# Patient Record
Sex: Male | Born: 1945 | Race: White | Hispanic: No | Marital: Married | Smoking: Never smoker
Health system: Southern US, Community
[De-identification: ages and names within clinical notes are randomized; demographics above are authoritative.]

## PROBLEM LIST (undated history)

## (undated) DIAGNOSIS — J189 Pneumonia, unspecified organism: Secondary | ICD-10-CM

## (undated) DIAGNOSIS — I2510 Atherosclerotic heart disease of native coronary artery without angina pectoris: Secondary | ICD-10-CM

## (undated) DIAGNOSIS — I119 Hypertensive heart disease without heart failure: Secondary | ICD-10-CM

## (undated) DIAGNOSIS — M1990 Unspecified osteoarthritis, unspecified site: Secondary | ICD-10-CM

## (undated) DIAGNOSIS — F4310 Post-traumatic stress disorder, unspecified: Secondary | ICD-10-CM

## (undated) DIAGNOSIS — I219 Acute myocardial infarction, unspecified: Secondary | ICD-10-CM

## (undated) DIAGNOSIS — K219 Gastro-esophageal reflux disease without esophagitis: Secondary | ICD-10-CM

## (undated) DIAGNOSIS — E785 Hyperlipidemia, unspecified: Secondary | ICD-10-CM

## (undated) DIAGNOSIS — D649 Anemia, unspecified: Secondary | ICD-10-CM

## (undated) HISTORY — PX: SHOULDER SURGERY: SHX246

## (undated) HISTORY — PX: PROSTATE SURGERY: SHX751

## (undated) HISTORY — PX: TONSILLECTOMY: SUR1361

## (undated) HISTORY — PX: EYE SURGERY: SHX253

## (undated) HISTORY — DX: Hypertensive heart disease without heart failure: I11.9

## (undated) HISTORY — PX: KNEE SURGERY: SHX244

## (undated) HISTORY — DX: Hyperlipidemia, unspecified: E78.5

## (undated) HISTORY — PX: OTHER SURGICAL HISTORY: SHX169

---

## 1998-08-03 ENCOUNTER — Ambulatory Visit: Admission: RE | Admit: 1998-08-03 | Discharge: 1998-08-03 | Payer: Self-pay | Admitting: *Deleted

## 1999-04-29 ENCOUNTER — Ambulatory Visit: Admission: RE | Admit: 1999-04-29 | Discharge: 1999-04-29 | Payer: Self-pay | Admitting: Pulmonary Disease

## 1999-06-18 ENCOUNTER — Encounter: Payer: Self-pay | Admitting: *Deleted

## 1999-06-18 ENCOUNTER — Ambulatory Visit (HOSPITAL_COMMUNITY): Admission: RE | Admit: 1999-06-18 | Discharge: 1999-06-18 | Payer: Self-pay | Admitting: *Deleted

## 1999-08-08 ENCOUNTER — Ambulatory Visit (HOSPITAL_COMMUNITY): Admission: RE | Admit: 1999-08-08 | Discharge: 1999-08-09 | Payer: Self-pay | Admitting: *Deleted

## 1999-11-26 ENCOUNTER — Ambulatory Visit: Admission: RE | Admit: 1999-11-26 | Discharge: 1999-11-26 | Payer: Self-pay | Admitting: Pulmonary Disease

## 2000-07-05 ENCOUNTER — Emergency Department (HOSPITAL_COMMUNITY): Admission: EM | Admit: 2000-07-05 | Discharge: 2000-07-05 | Payer: Self-pay | Admitting: Emergency Medicine

## 2000-07-16 ENCOUNTER — Emergency Department (HOSPITAL_COMMUNITY): Admission: EM | Admit: 2000-07-16 | Discharge: 2000-07-16 | Payer: Self-pay | Admitting: Emergency Medicine

## 2001-09-29 HISTORY — PX: CORONARY ANGIOPLASTY WITH STENT PLACEMENT: SHX49

## 2001-10-22 ENCOUNTER — Ambulatory Visit (HOSPITAL_COMMUNITY): Admission: RE | Admit: 2001-10-22 | Discharge: 2001-10-23 | Payer: Self-pay | Admitting: Cardiology

## 2002-09-24 ENCOUNTER — Encounter: Payer: Self-pay | Admitting: Emergency Medicine

## 2002-09-24 ENCOUNTER — Emergency Department (HOSPITAL_COMMUNITY): Admission: EM | Admit: 2002-09-24 | Discharge: 2002-09-24 | Payer: Self-pay | Admitting: Emergency Medicine

## 2002-09-29 ENCOUNTER — Emergency Department (HOSPITAL_COMMUNITY): Admission: EM | Admit: 2002-09-29 | Discharge: 2002-09-29 | Payer: Self-pay | Admitting: Emergency Medicine

## 2003-04-18 ENCOUNTER — Ambulatory Visit (HOSPITAL_COMMUNITY): Admission: RE | Admit: 2003-04-18 | Discharge: 2003-04-18 | Payer: Self-pay | Admitting: Internal Medicine

## 2004-05-23 ENCOUNTER — Ambulatory Visit (HOSPITAL_COMMUNITY): Admission: RE | Admit: 2004-05-23 | Discharge: 2004-05-23 | Payer: Self-pay | Admitting: Family Medicine

## 2004-10-05 ENCOUNTER — Ambulatory Visit (HOSPITAL_COMMUNITY): Admission: RE | Admit: 2004-10-05 | Discharge: 2004-10-05 | Payer: Self-pay | Admitting: Family Medicine

## 2004-11-12 ENCOUNTER — Ambulatory Visit: Payer: Self-pay | Admitting: Orthopedic Surgery

## 2004-11-20 ENCOUNTER — Ambulatory Visit (HOSPITAL_COMMUNITY): Admission: RE | Admit: 2004-11-20 | Discharge: 2004-11-20 | Payer: Self-pay | Admitting: *Deleted

## 2004-11-20 ENCOUNTER — Ambulatory Visit: Payer: Self-pay | Admitting: Orthopedic Surgery

## 2004-11-21 ENCOUNTER — Ambulatory Visit: Payer: Self-pay | Admitting: Orthopedic Surgery

## 2004-11-29 ENCOUNTER — Encounter (HOSPITAL_COMMUNITY): Admission: RE | Admit: 2004-11-29 | Discharge: 2004-12-29 | Payer: Self-pay | Admitting: Orthopedic Surgery

## 2004-12-04 ENCOUNTER — Ambulatory Visit: Payer: Self-pay | Admitting: Orthopedic Surgery

## 2004-12-31 ENCOUNTER — Encounter (HOSPITAL_COMMUNITY): Admission: RE | Admit: 2004-12-31 | Discharge: 2005-01-30 | Payer: Self-pay | Admitting: Orthopedic Surgery

## 2005-01-02 ENCOUNTER — Ambulatory Visit: Payer: Self-pay | Admitting: Orthopedic Surgery

## 2005-01-31 ENCOUNTER — Ambulatory Visit: Payer: Self-pay | Admitting: Orthopedic Surgery

## 2005-06-17 ENCOUNTER — Ambulatory Visit: Payer: Self-pay | Admitting: Orthopedic Surgery

## 2005-08-29 ENCOUNTER — Ambulatory Visit: Payer: Self-pay | Admitting: Orthopedic Surgery

## 2005-09-04 ENCOUNTER — Ambulatory Visit: Payer: Self-pay | Admitting: Internal Medicine

## 2005-09-04 ENCOUNTER — Ambulatory Visit (HOSPITAL_COMMUNITY): Admission: RE | Admit: 2005-09-04 | Discharge: 2005-09-04 | Payer: Self-pay | Admitting: Internal Medicine

## 2005-11-07 ENCOUNTER — Ambulatory Visit: Payer: Self-pay | Admitting: Orthopedic Surgery

## 2006-04-02 ENCOUNTER — Ambulatory Visit: Payer: Self-pay | Admitting: Internal Medicine

## 2006-04-02 ENCOUNTER — Ambulatory Visit (HOSPITAL_COMMUNITY): Admission: RE | Admit: 2006-04-02 | Discharge: 2006-04-02 | Payer: Self-pay | Admitting: Internal Medicine

## 2006-04-02 ENCOUNTER — Encounter (INDEPENDENT_AMBULATORY_CARE_PROVIDER_SITE_OTHER): Payer: Self-pay | Admitting: *Deleted

## 2007-07-15 ENCOUNTER — Ambulatory Visit (HOSPITAL_COMMUNITY): Admission: RE | Admit: 2007-07-15 | Discharge: 2007-07-16 | Payer: Self-pay | Admitting: Neurosurgery

## 2007-09-29 ENCOUNTER — Ambulatory Visit (HOSPITAL_COMMUNITY): Admission: RE | Admit: 2007-09-29 | Discharge: 2007-09-29 | Payer: Self-pay | Admitting: General Surgery

## 2010-03-30 HISTORY — PX: TRANSTHORACIC ECHOCARDIOGRAM: SHX275

## 2010-05-14 ENCOUNTER — Ambulatory Visit (HOSPITAL_COMMUNITY): Admission: RE | Admit: 2010-05-14 | Discharge: 2010-05-14 | Payer: Self-pay | Admitting: Cardiology

## 2010-05-17 ENCOUNTER — Ambulatory Visit (HOSPITAL_COMMUNITY): Admission: RE | Admit: 2010-05-17 | Discharge: 2010-05-17 | Payer: Self-pay | Admitting: Cardiology

## 2010-05-17 HISTORY — PX: CARDIAC CATHETERIZATION: SHX172

## 2010-09-04 ENCOUNTER — Ambulatory Visit (HOSPITAL_COMMUNITY): Admission: RE | Admit: 2010-09-04 | Discharge: 2010-09-04 | Payer: Self-pay | Admitting: Family Medicine

## 2011-05-14 NOTE — Op Note (Signed)
Cory [name], Cory [name]              ACCOUNT NO.:  [id number]   MEDICAL RECORD NO.:  [id number]          PATIENT TYPE:  AMB   LOCATION:  SDS                          FACILITY:  [facility]   PHYSICIAN:  Cristi Loron, M.D.DATE OF BIRTH:  30-Jan-1946   DATE OF PROCEDURE:  07/15/2007  DATE OF DISCHARGE:                               OPERATIVE REPORT   BRIEF HISTORY:  The patient is a 65 year old white male who suffered  from back and right leg pain.  He failed medical management, was worked  up with lumbar MRI which demonstrated herniated disk at L4-5.  I  discussed the various treatment options with the patient including  surgery.  The patient weighed the risks, benefits and alternatives  decided proceed with a right L4 hemilaminectomy to decompress both the  right L4 and L5 nerve roots.   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   PROCEDURE:  Right L4 hemilaminectomy to decompress both the right L4-L5  nerve root and right L4-5 microdiskectomy using microdissection.   SURGEON:  Dr. Delma Officer.   ASSISTANT:  Dr. Shirlean Kelly   ANESTHESIA:  General endotracheal anesthesia.   ESTIMATED BLOOD LOSS:  50 mL.   SPECIMENS:  None.   DRAINS:  None.   COMPLICATIONS:  None.   DESCRIPTION OF PROCEDURE:  The patient brought to operating room by  anesthesia team, general endotracheal anesthesia was induced.  The  patient was turned to the prone position on the Wilson frame.  His  lumbosacral region was then shaved with clippers and prepared with  Betadine scrub and Betadine solution.  Sterile drapes were applied.  I  then injected area to be incised with Marcaine with epinephrine  solution.  Used scalpel to make a linear midline incision over the L4-5  interspace.  I used electrocautery perform a  right-sided subperiosteal  dissection exposing the right spinous process lamina of L3, 4 and 5.  I  then obtained intraoperative radiograph to confirm our location.   We then inserted the [facility] retractor for exposure and brought the  operative microscope into the field and under its magnification  illumination completed the microdissection/decompression.  Used high-  speed drill to perform a right L4 laminotomy.  I completed the right 4  hemilaminectomy with Kerrison punch and removed the right L3-4 and L4-5  ligamentum flavum.  I performed foraminotomies about the right L4 and L5  nerve root.  We then used microdissection to free up the thecal sac and  nerve roots from the epidural tissue and Dr. Newell Coral gently retracted  the neural structures medially with a D'Errico retractor.  We  encountered a free fragment disk herniation which had migrated cephalad  direction and was compressing the right L4 nerve root as it entered into  the neural foramen.  We used microdissection and the micro pituitary  forceps to free up and remove this disk fragment decompressing L4 nerve.  We then inspected the L4-5 intervertebral disk, it was bulging somewhat.  We used bipolar cautery to dissect the annulus.  We then inspected  caudal to the L4-5 intervertebral disk and I did not encounter any  further disk herniations.  We then palpated along the ventral surface of  the thecal sac and along the exit route of the right L4 and L5 nerve  roots and noted the nerve root well decompressed.  We then obtained  hemostasis using bipolar electrocautery.  We irrigated the wound out  bacitracin solution and then removed the [facility] retractor.  We then  reapproximated the patient's thoracolumbar fascia with interrupted #1  Vicryl sutures, subcutaneous tissue with 2-0 Vicryl suture and skin with  Steri-Strips and Benzoin.  The wound was then coated with bacitracin  ointment, sterile dressing applied.  The drapes  were removed.  The  patient was subsequently returned to supine position, extubated by  anesthesia team and transported to post anesthesia care unit in stable  condition.  All sponge and the instrument and needle counts correct end  this case.      Cristi Loron, M.D.  Electronically Signed     JDJ/MEDQ  D:  07/15/2007  T:  07/16/2007  Job:  [id number]

## 2011-05-17 NOTE — Cardiovascular Report (Signed)
[place]  Patient:    Cory [name], Cory [name] Visit Number: [id number] MRN: [id number]          Service Type: CAT Location: [id number] Attending Physician:  Pamella Pert Dictated by:   Delrae Rend, M.D. Proc. Date: 10/22/01 Admit Date:  10/22/2001 Discharge Date: 10/23/2001   CC:         Jonell Cluck, M.D.   Cardiac Catheterization  PROCEDURES PERFORMED: 1. Left ventriculography. 2. Selective right and left coronary arteriography. 3. Percutaneous transluminal coronary angioplasty and stenting of the    diagonal #1 with reduction of stenosis from 99% to 0% with a 2.5- x 15-mm    Express 2 stent.  INDICATIONS:  Mr. Purk is a 65 year old white male with no significant past medical history who underwent cardiovascular evaluation as a part of an insurance work-up.  On further questioning, he also admitted to having shortness of breath and chest discomfort which was suggestive of chronic stable angina pectoris.  He had a markedly abnormal exercise stress test; hence, he was brought to the cardiac catheterization lab to evaluate his coronary anatomy.  HEMODYNAMIC DATA:  The left ventricular pressure was 117/14 with an end diastolic pressure of 23 mmHg.  The central aortic pressure was 117/58 with a mean of 88 mmHg.  There was no pressure gradient across the aortic valve.  ANGIOGRAPHIC DATA:  Left ventriculography revealed mild distal anterolateral wall hypokinesis. The ejection fraction was preserved and was estimated around 55%.  There was no mitral regurgitation.  Coronary angiography: 1. Right coronary artery:  The right coronary artery was a dominant vessel.    It gives origin to PDA and PLV branches.  The PDA has proximal 40%    stenosis.  The right coronary artery has mild diffuse disease. 2. Left main coronary artery:  The left main coronary artery is a large    caliber vessel and trifurcates into a moderate sized  intermedius, a    moderate sized circumflex, and a large left anterior descending artery. 3. Circumflex artery:  The circumflex coronary artery is a moderate sized    vessel and gives origin to an obtuse marginal #1.  It is disease-free. 4. Intermedius coronary artery:  The intermedius coronary artery is a    moderate sized vessel and has a 50-60% proximal to ostial stenosis. 5. Left anterior descending artery:  The left anterior descending artery is a    large caliber vessel.  There is about 30% stenosis in the mid segment.  It    gives origin to a very large diagonal #1 and a small diagonal #2.  The    diagonal #1 is subtotally occluded in the mid segment (99% stenosis) with    TIMI-2 flow.  The diagonal #1 also has a secondary branch which appears to    be chronically occluded and is faintly regionalized in the late    cineangiography.  It is a small vessel.  IMPRESSION: 1. Normal left ventricular systolic function with distal anterolateral wall    hypokinesis. 2. High-grade stenosis of the diagonal #1. 3. Mildly elevated left ventricular end diastolic pressures.  RECOMMENDATIONS:  Will proceed with intervention of the diagonal #1.  INTERVENTIONAL DATA:  The diagonal #1 was predilated with a 2.5- x 20-mm CrossSail balloon.  The dilatation was performed at 14 atmospheric pressures for two minutes.  There was type B dissection noted at the end of the procedure.  PTCA and stenting with a 2.5- x 16-mm  Express 2 stent deployed at 12 atmospheric pressures for one minute.  The final stenosis was reduced from 99% to 0%.  The TIMI flow improved from TIMI-2 to TIMI-3.  There was stent jail and no flow was seen in the secondary diagonal branch which was a tiny vessel. Unable to wire this small secondary branch.  RECOMMENDATIONS: 1. Aggressive risk factor modification is recommended.  Will check his lipid    panel and be aggressive with his lipid management.  Will start on Zocor for    at  least a period of four to six weeks. 2. Continue with Toprol XL 50 q.h.s. and also start the patient on ACE    inhibitors.  TECHNIQUE OF PROCEDURE:  Under usual sterile precautions using 6-French right femoral arterial access, a 6-French MultiPurpose B2 catheter was advanced into the ascending aorta over a 0.035-mm J wire.  The catheter was gently advanced to the left ventricle and left ventricular pressures were monitored.  Hand contrast injection of the left ventricle was performed both in RAO and LAO projections.  The catheter was flushed with saline and pulled back into the ascending aorta.  Pressure gradient across the aortic valve was monitored. The right coronary artery was selectively engaged and arteriography was performed.  Then the catheter was disengaged from the right coronary artery and the left main coronary artery was selectively engaged and arteriography was performed.  The catheter was pulled out of the body.  INTERVENTIONAL TECHNIQUE:  The 6-French right femoral arterial access sheath was exchanged to an 8-French sheath.  Then an 8-French FL4 guide was advanced in the ascending aorta over a 0.035-mm J wire.  The left main coronary artery was selectively engaged and arteriography was performed.  Then a 180-cm x 0.014-mm luge wire was advanced into the guide and then eventually advanced to the left anterior descending artery, and the diagonal #1 was carefully wired and the lesion was easily crossed and the tip of the wire was carefully placed in the distal diagonal #1.  Then a 2.5- x 20-mm CrossSail balloon was advanced into the diagonal #1 and after confirming the position of the balloon, the balloon angioplasty at 14 atmospheric pressures for two minutes was performed. The balloon was deflated and pulled back into the guiding catheter and arteriography was performed.  Because of type B dissection, the balloon was withdrawn out of the body and exchanged to a 2.5- x 16-mm  Express 2 stent.  This stent was advanced into the lesion and after confirming the position in the diagonal lesion, the stent was deployed at 12 atmospheric pressures for one minute and the balloon was deflated and pulled back in the guiding catheter and arteriography was performed.  There was excellent flow noted with TIMI-3 flow.  This had improved from TIMI-2.  There was no evidence of dissection, no evidence of thrombus.  Then the balloon was withdrawn out of the body.  Then the same luge wire was used in an attempt to open the secondary branch in the diagonal #1; however, we were unable to cross into the occluded secondary branch of the diagonal.  Hence, the technique was abandoned and the guide catheter was disengaged and pulled out of the body.  The arterial access sheath was sutured in place and the patient was transferred to the floor in a stable condition.  Dictated by:   Delrae Rend, M.D.  Attending Physician:  Pamella Pert DD:  10/22/01 TD:  10/23/01 Job: [id number]

## 2011-05-17 NOTE — Op Note (Signed)
NAMEMARTICE, HELLUMS              ACCOUNT NO.:  [id number]   MEDICAL RECORD NO.:  [id number]          PATIENT TYPE:  AMB   LOCATION:  DAY                           FACILITY:  [facility]   PHYSICIAN:  Hildred Laser, M.D.    DATE OF BIRTH:  [birth year]   DATE OF PROCEDURE:  04/02/2006  DATE OF DISCHARGE:                                 OPERATIVE REPORT   PROCEDURE:  Esophagogastroduodenoscopy with esophageal dilation and biopsy.   INDICATION:  Cory [name] is a 65 year old Caucasian male with a several year  history of GERD who presented with solid food dysphagia back in September  2006.  He was noted to have ulcerative esophagitis with noncritical  stricture at the GE junction which was dilated to 19 mm.  He also had  abnormal patch of mucosa at the distal esophagus raising the question of  Barrett's.  This was not biopsied in the setting of inflammation.  The  stricture was dilated to 19 mm with a balloon.  He also had erosive antral  gastritis but his H. pylori serology was negative.  He has been maintained  on omeprazole 20 mg b.i.d. and antireflux measures.  He states his  swallowing has improved a great deal although he still has difficulty with  pills and at times with solids.  He denies heartburn, regurgitation.  The  procedure was reviewed with the patient, informed consent was obtained.   MEDICATIONS FOR CONSCIOUS SEDATION:  Benzocaine spray for pharyngeal topical  anesthesia, Demerol 50 mg IV, Versed 6 mg IV.   FINDINGS:  Procedure performed in endoscopy suite.  The patient's vital  signs and O2 sats were monitored during procedure and were normal.  The  patient was placed in the left lateral position and the Olympus videoscope  was passed via oropharynx without any difficulty into esophagus.   Esophagus.  Mucosa of the esophagus was normal.  There is 10 by 15 mm patch  of gastric type mucosa just above the GE junction.  Noncritical ring noted  at GE junction which was at 38 cm.   There was a small ulcer just distal to  this patch at the GE junction.  This area was biopsied on the way out and  specimen submitted in one container.  A few erosions were noted involving  herniated part of the stomach.  Hiatus was at 42 cm.   Stomach was empty and distended very well insufflation.  The folds of the  proximal stomach were normal.  Examination of the mucosa at body, antrum,  pyloric channel as well as angularis, fundus and cardia was normal.   Duodenum bulb mucosa was normal.  The scope was passed into postbulbar  duodenum where mucosa and folds were normal.  Endoscope was pulled back and  into the stomach.   Esophageal dilation was performed using a balloon dilator.  The balloon  dilator was advanced through the scope.  The guidewire was pushed out in the  gastric lumen.  The balloon dilator was positioned across the distal  esophagus and by withdrawing the scope in the body of the esophagus.  Balloon  was initially dilated to 18 then to 19 and then to 20 mm and the  distal esophagus was dilated.  However, there was no mucosal disruption  noted as was the case on previous dilation.  The balloon dilator was  withdrawn.  A biopsy was taken from this patch of mucosa in the GE junction  but there was small ulcer.  The endoscope was withdrawn.  The patient  tolerated the procedure well.   FINAL DIAGNOSIS:  Small patch of gastric type mucosa at distal esophagus  which was biopsied for histology to rule out Barrett's.  There is a small  ulcer at the GE junction.  The esophagitis has healed significantly since  previous exam of six months ago.  Noncritical ring at GE junction which was  dilated to 20 mm with a balloon without mucosal disruption.  Erosions noted  involving herniated part of the stomach without mucosal disruption.  A 4 cm  size sliding hiatal hernia with erosions involving herniated part of the  stomach.  Normal exam of the stomach, first and second part of the  duodenum.   RECOMMENDATIONS:  He will continue antireflux measures and omeprazole at 20  mg b.i.d.Marland Kitchen  I will be contacting the patient with biopsy results.  I would  like to see him in the office in six months.      Hildred Laser, M.D.  Electronically Signed     NR/MEDQ  D:  04/02/2006  T:  04/02/2006  Job:  [id number]   cc:   Bonne Dolores, M.D.  Fax: [phone]

## 2011-05-17 NOTE — Op Note (Signed)
NAMESHAWAN, CORELLA              ACCOUNT NO.:  [id number]   MEDICAL RECORD NO.:  [id number]          PATIENT TYPE:  AMB   LOCATION:  DAY                           FACILITY:  [facility]   PHYSICIAN:  Vickki Hearing, M.D.DATE OF BIRTH:  1946-02-25   DATE OF PROCEDURE:  11/20/2004  DATE OF DISCHARGE:                                 OPERATIVE REPORT   HISTORY:  This is a 65 year old male with left shoulder pain for several  months. Pain is related to the area over the [facility] joint. The patient had an  injection, took some oral medications, and continued to have pain.  Radiographs show a large acromioclavicular spur. Portion of the spur comes  from the acromion as well as the distal clavicle.   Pertinent positive physical findings include a positive cross-chest  adduction test and tenderness over the [facility] joint and painful forward  elevation of the shoulder.   PREOPERATIVE DIAGNOSIS:  Acromioclavicular joint arthritis, right shoulder.   POSTOPERATIVE DIAGNOSIS:  Acromioclavicular joint arthritis, right shoulder.   PROCEDURE:  Open Mumford procedure, left shoulder.   SURGEON:  Vickki Hearing, M.D.   ASSISTANT:  No assistants.   ANESTHESIA:  General.   OPERATIVE FINDINGS:  Hypertrophic acromioclavicular joint with degenerative  arthritis.   SPECIMENS:  Distal clavicle and medial acromion and anterior acromion.   DETAILS OF PROCEDURE:  The patient identified as Public relations account executive. He marked  his left shoulder as the operative site, and my initials were signed in the  same place. He was given Ancef and taken to the operating room for general  anesthesia. Once satisfactory anesthesia was obtained, he was prepped and  draped with a bump under the left shoulder. We took the time out as  required, and everyone in the operating room agreed on the procedure of open  Mumford, left shoulder, on [employer].   The skin was injected with Marcaine with epinephrine, and then an incision  was  made over the [facility] joint. This was taken down to the fascia which was  split transversely in line with the distal clavicle and carried across the  acromion. The [facility] joint was encountered and debrided. The distal 8 mm of  clavicle were removed. The posterior portion was tapered medially. The  portion of the acromion anteriorly and medially were also removed.  Bursectomy was performed. Rotator cuff was intact at the site of the spur.  Wound was irrigated. The bone edges were covered with bone wax. The fascia  was closed with #1 Vicryl, subcu tissue with 2-0 Vicryl, skin staples were  used to close the  skin. An additional 30 cc of Marcaine were injected, 10 in the remaining [facility]  joint area and 20 in the skin itself. Sterile dressings and a CryoCuff was  applied. The patient was extubated and taken to recovery room in stable  condition.     Weyman Croon   SEH/MEDQ  D:  11/20/2004  T:  11/20/2004  Job:  [id number]

## 2011-05-17 NOTE — Op Note (Signed)
   Cory [name], Cory [name]                        ACCOUNT NO.:  [id number]   MEDICAL RECORD NO.:  [id number]                   PATIENT TYPE:  AMB   LOCATION:  DAY                                  FACILITY:  [facility]   PHYSICIAN:  Lionel December, M.D.                 DATE OF BIRTH:  09/16/1946   DATE OF PROCEDURE:  04/18/2003  DATE OF DISCHARGE:                                 OPERATIVE REPORT   PROCEDURE:  Total colonoscopy.   INDICATIONS FOR PROCEDURE:  The patient is a 65 year old Caucasian male who  is undergoing screening colonoscopy.  He is at average risk for CRC.  The  procedure was reviewed with the patient, and informed consent was obtained.   PREOPERATIVE MEDICATIONS:  Demerol 20 mg IV, Versed 3 mg IV in divided  doses.   INSTRUMENT USED:  Olympus video system.   FINDINGS:  The procedure was performed in the endoscopy suite.  The  patient's vital signs and O2 saturations were monitored during the procedure  and remained stable.  The patient was placed in the left lateral position  and rectal examination performed.  This was within normal limits.  The scope  was placed into the rectum and advanced to the region of the sigmoid colon  and beyond.  Scattered diverticula were noted at the sigmoid and descending  colon.  The scope was passed to the cecum.  The preparation was excellent.  The cecum was identified by the ileocecal valve and appendiceal orifice.  Pictures were taken for the record.  As the scope was withdrawn, the colonic  mucosa was once again carefully examined.  There were no polyps and/or tumor  masses.  The rectal mucosa was normal.  The scope was retroflexed to examine  the anorectal junction which was unremarkable.  The endoscope was  straightened and withdrawn.  The patient tolerated the procedure well.   FINAL DIAGNOSIS:  Diverticula at sigmoid and descending colon; otherwise,  normal colonoscopy.   RECOMMENDATIONS:  1. High fiber diet.  2. Citrucel one  tablespoon full daily.                                               Lionel December, M.D.   NR/MEDQ  D:  04/18/2003  T:  04/18/2003  Job:  [id number]   cc:   Patrica Duel, M.D.  [address]  [place]  [place] [address]  Fax: [phone]

## 2011-05-17 NOTE — Discharge Summary (Signed)
Cory [name]. [facility]  Patient:    Cory [name], Cory [name] Visit Number: [id number] MRN: [id number]          Service Type: CAT Location: [id number] Attending Physician:  Pamella Pert Dictated by:   Marya Fossa, P.A. Admit Date:  10/22/2001 Discharge Date: 10/23/2001   CC:         Jonell Cluck, M.D.   Discharge Summary  DATE OF BIRTH:  07/24/1946  [facility] MEDICAL RECORD NUMBER:  [id number].  ADMISSION DIAGNOSES: 1. Abnormal treadmill exercise stress test. 2. Chest pain with exertion. 3. Obstructive sleep apnea.  DISCHARGE DIAGNOSES: 1. Coronary artery disease, status post cardiac catheterization with    intervention to a subtotal diagonal 1 lesion.  Residual noncritical    coronary artery disease.  Ejection fraction 55%. 2. Lipid profile pending at the time of discharge. 3. Chest pain with exertion. 4. Obstructive sleep apnea. 5. Abnormal treadmill exercise stress test.  HISTORY OF PRESENT ILLNESS:  Cory [name] is a pleasant 65 year old white male with a history of obstructive sleep apnea who underwent routine physical examination as part of life insurance policy.  At that time he was found to have some irregular skipped beats, and a stress test was ordered to evaluate for potential silent myocardial ischemia.  The patient states in the last year he has had chest discomfort in the form of heaviness with extremes of exertion.  He also has chronic shortness of breath which he relates to his obstructive sleep apnea.  He does use a CPAP machine at home.  He denies nocturnal dyspnea or orthopnea, and denies pain at work.  Exercise stress testing revealed chest tightness with peak exercise with 2 mm horizontal ST segment depression which persisted 8 minutes into recovery.  For this reason, the patient is scheduled for elective cardiac catheterization for definitive diagnosis and possible intervention.  PROCEDURES:   Cardiac catheterization on 10/22/01, by Dr. Jeanella Cara, with intervention to diagonal 1.  COMPLICATIONS:  None.  CONSULTATIONS:  None.  HOSPITAL COURSE:  Cory [name] was admitted to [facility] on 10/22/01, for elective cardiac catheterization.  Pre-procedure laboratory studies revealed a white blood cells of 5.4, hemoglobin of 14.9, and platelets 177. INR was 0.9.  Potassium 4.5, BUN 18, creatinine 1.0.  The patient underwent cardiac catheterization on 10/22/01, by Dr. Jeanella Cara. This revealed a normal left main, normal left anterior descending artery.  The diagonal 1 had a 99% subtotaled occlusion in its proximal portion.  There was a ramus intermedius branch with a 50 to 60% ostial lesion.  Circumflex was widely patent.  Right coronary artery had a distal 40% posterior descending coronary artery lesion.  Ejection fraction 55%.  Mild distal anterolateral hypokinesis on LV gram.  Dr. Jacinto Halim proceeded with intervention to the diagonal 1 lesion, reducing it from 99 to 0% with balloon and stent.  He reestablished TIMI III flow from TIMI II flow.  There was a small type B dissection which was stable at the conclusion of the procedure.  The patient tolerated the procedure well.  There were no problems with sheath pull.  On 10/23/01, the patient remained stable.  BUN 10, creatinine 0.9.  Groin stable.  It was felt the patient was stable for discharge to home.  He was seen by cardiac rehab, and reviewed risk factors and precautions for prevention of coronary artery disease.  DISCHARGE MEDICATIONS: 1. Toprol XL 50 mg q.d. 2. Zocor 20 mg q.d. 3. Accupril  5 mg b.i.d. 4. Aspirin 325 mg q.d. 5. Plavix 75 mg q.d. x 1 month. 6. Restoril 15 mg p.r.n. sleep. 7. Nitroglycerin p.r.n. chest pain.  The patient is to stop Imdur.  ACTIVITY:  No strenuous activity, lifting more than 5 pounds, or driving for two days.  He will stay out of work for one week.  He may return to work without  restriction on November 02, 2001.  DIET:  Low fat, low cholesterol, low salt diet.  WOUND CARE:  The patient may shower.  FOLLOWUP:  The office will call the patient with an appointment to see Dr. Jacinto Halim back in two weeks in the [place] office.  He needs to call and set up a follow-up appointment with Dr. Leonor Liv office. Dictated by:   Marya Fossa, P.A. Attending Physician:  Pamella Pert DD:  10/23/01 TD:  10/26/01 Job: [id number]

## 2011-05-17 NOTE — Op Note (Signed)
Cory [name], [name]              ACCOUNT NO.:  [id number]   MEDICAL RECORD NO.:  [id number]          PATIENT TYPE:  AMB   LOCATION:  DAY                           FACILITY:  [facility]   PHYSICIAN:  Lionel December, M.D.    DATE OF BIRTH:  1946/09/18   DATE OF PROCEDURE:  09/04/2005  DATE OF DISCHARGE:                                 OPERATIVE REPORT   PROCEDURE:  Esophagogastroduodenoscopy with esophageal dilation.   Cory [name] is 65 year old Caucasian male who presents with few months history  of dysphagia primarily to solids and his pills which is progressive. He has  history of heartburn. He states OTC Zantac takes Carafate. His last EGD was  several years ago in [place].   Procedure risks were reviewed the patient, informed consent was obtained.   PREMEDICATION:  Cetacaine spray for pharyngeal topical anesthesia, Demerol  50 milligrams IV, Versed 6 milligrams IV in divided dose.   FINDINGS:  Procedure performed in endoscopy suite. The patient's vital signs  and O2 sat were monitored during procedure and remained stable.   The patient was placed left lateral position. Olympus videoscope was passed  via oropharynx without any difficulty into esophagus.   Esophagus.  Mucosa of the proximal and middle third was normal. Distally  there were four ulcers. One ulcer was over a centimeter long about 4 mm  wide, about 2 cm proximal to GE junction.  The longest ulcer was about 2 cm  long and extended down to GE junction and two ulcers were at GE junction.  There was also a patch of Barrett's mucosa about 10 x 20 mm at distal  esophagus away from these ulcers around 10 o'clock.  Stricture was noted at  GE junction was at which was at 37 cm and was felt to be noncritical. Hiatus  was 41 cm.   Stomach. It was empty and distended very well with insufflation. Folds  proximal stomach were normal. Examination of mucosa revealed few antral  erosions but no ulcer was noted. Pyloric channel was  patent. Angularis,  fundus and cardia were examined by retroflexing the scope and were normal.   Duodenum.  Bulbar mucosa was normal. Scope was passed second part of  duodenum. The mucosa and folds were normal. Endoscope was withdrawn.  Endoscope was pulled back to the stomach. A balloon dilator was advanced  through the scope. The guidewire was pushed out and scope was withdrawn and  the dilator was positioned across the stricture and inflated to a diameter  of 18 mm. There was no mucosal disruption at GE junction. Therefore it was  inflated to a diameter of 19 mm and pressure was maintained for about a  minute. Disrupted and mucosal disruption and opening of the lumen. Pictures  taken for the record. Endoscope was withdrawn. The patient tolerated the  procedure well.   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  4 cm size sliding  hiatal hernia.  A short segment Barrett's esophagus which was not biopsied  given florid changes of esophagitis.  Erosive antral gastritis.   RECOMMENDATIONS:  Antireflux measures reinforced. He will stop Zantac, will  start him on omeprazole 20 milligrams p.o. b.i.d.  H pylori serology.  The  patient will return for follow-up EGD in 6 months from now when he will have  biopsy taken from this patch of Barrett's mucosa. The esophagus will be  redilated if indicated.      Lionel December, M.D.  Electronically Signed     NR/MEDQ  D:  09/04/2005  T:  09/04/2005  Job:  [id number]   cc:   Cory [name], M.D.  [address], [address]  [place]  [place] [address]  Fax: [phone]

## 2011-05-17 NOTE — H&P (Signed)
NAMESHONN, FARRUGGIA              ACCOUNT NO.:  [id number]   MEDICAL RECORD NO.:  [id number]          PATIENT TYPE:  AMB   LOCATION:  DAY                           FACILITY:  [facility]   PHYSICIAN:  Vickki Hearing, M.D.DATE OF BIRTH:  [birth year]   DATE OF ADMISSION:  DATE OF DISCHARGE:  LH                                HISTORY & PHYSICAL   CHIEF COMPLAINT:  Left shoulder pain.   HISTORY:  This is a 65 year old male who has left shoulder pain.   This patient has had several months of pain over his left shoulder, related  to the [facility] joint.  It is described as a dull ache and comes and goes.  It is  worsened by activity, relieved by rest.  There is no radiation.  The pain is  worse when the patient crosses his arm across the midline left to right.  We  injected the subacromial space.  X-rays showed some AC joint arthrosis.  That did not relieve his symptoms, and he now presents for surgery on his  left shoulder, mainly the [facility] joint, to have the [facility] joint excised via open  technique.   The patient's review of systems includes a 10-system review.  We listed all  10 systems as normal.   His past history reveals he is allergic to VIOXX and BENADRYL.  His medical  problems include sleep apnea, heart disease, hypertension, post-traumatic  stress disorder.  Previous surgeries include a right knee arthroscopy in  1985, a nasal polypectomy.  He had a tonsillectomy and adenoidectomy in  1997.  He had uvula and throat __________ in 1999.  He had a coronary stent  placed in 2002.   PHARMACY:  Two, for [employer], [place].   Multiple medications are taken.  The medications that he listed are  lisinopril, niacin, coated aspirin, multivitamin, trazodone, simvastatin,  Lexapro, and vitamin E.   He reported a negative family history.   SOCIAL HISTORY:  Family physician is Dr. Nobie Putnam.  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  His educational grade level is 12.   PHYSICAL EXAMINATION:  GENERAL:   A well-developed, well-nourished male with  normal grooming and hygiene.  He has a lean body habitus.  CARDIOVASCULAR:  No swelling or varicosities.  Pulses are intact.  EXTREMITIES:  Warm without edema or tenderness.  LYMPHATIC:  His cervical lymph nodes are benign.  MUSCULOSKELETAL:  His right shoulder shows full range of motion, strength,  and stability and is nontender.  His left AC joint is prominent, as is the  right, but the left is tender.  His cross-chest test is positive.  His  impingement sign is negative.  He does have some pain with internal  rotation, which was two levels below his opposite right shoulder.  Flexion  and external rotation were normal, as were the stability tests, and his cuff  tests were normal.  SKIN:  Without scar, rash, lesions, or ulceration.  NEUROPSYCHIATRIC:  Normal reflexes, sensation, mood, and orientation.  He  was awake and alert.  His coordination was normal.   His radiographs show AC  joint arthritis.   PLAN:  Open AC joint arthroplasty, so-called Mumford procedure, left  shoulder.     Weyman Croon   SEH/MEDQ  D:  11/19/2004  T:  11/19/2004  Job:  [id number]

## 2011-10-10 LAB — CLOTEST (H. PYLORI), BIOPSY: Helicobacter screen: NEGATIVE

## 2011-10-15 LAB — COMPREHENSIVE METABOLIC PANEL
ALT: 23
AST: 18
Albumin: 4.1
Alkaline Phosphatase: 64
BUN: 15
CO2: 28
Calcium: 9.7
Chloride: 103
Creatinine, Ser: 0.44
GFR calc Af Amer: >60
GFR calc non Af Amer: >60
Glucose, Bld: 92
Potassium: 4.2
Sodium: 138
Total Bilirubin: 0.5
Total Protein: 6.9

## 2011-10-15 LAB — CBC
HCT: 40.5
Hemoglobin: 13.9
MCHC: 34.3
MCV: 91.9
Platelets: 207
RBC: 4.41
RDW: 13.1
WBC: 6.2

## 2011-11-10 ENCOUNTER — Emergency Department (HOSPITAL_COMMUNITY): Payer: PRIVATE HEALTH INSURANCE

## 2011-11-10 ENCOUNTER — Emergency Department (HOSPITAL_COMMUNITY)
Admission: EM | Admit: 2011-11-10 | Discharge: 2011-11-10 | Disposition: A | Discharge status: Home / Self Care | Payer: PRIVATE HEALTH INSURANCE | Attending: Emergency Medicine | Admitting: Emergency Medicine

## 2011-11-10 DIAGNOSIS — Z7982 Long term (current) use of aspirin: Secondary | ICD-10-CM | POA: Insufficient documentation

## 2011-11-10 DIAGNOSIS — R209 Unspecified disturbances of skin sensation: Secondary | ICD-10-CM | POA: Insufficient documentation

## 2011-11-10 DIAGNOSIS — I10 Essential (primary) hypertension: Secondary | ICD-10-CM | POA: Insufficient documentation

## 2011-11-10 DIAGNOSIS — R079 Chest pain, unspecified: Secondary | ICD-10-CM | POA: Insufficient documentation

## 2011-11-10 DIAGNOSIS — Z9861 Coronary angioplasty status: Secondary | ICD-10-CM | POA: Insufficient documentation

## 2011-11-10 DIAGNOSIS — Z79899 Other long term (current) drug therapy: Secondary | ICD-10-CM | POA: Insufficient documentation

## 2011-11-10 DIAGNOSIS — J189 Pneumonia, unspecified organism: Secondary | ICD-10-CM

## 2011-11-10 LAB — COMPREHENSIVE METABOLIC PANEL
ALT: 15 U/L (ref 0–53)
AST: 14 U/L (ref 0–37)
Albumin: 3.2 g/dL — ABNORMAL LOW (ref 3.5–5.2)
Alkaline Phosphatase: 56 U/L (ref 39–117)
BUN: 17 mg/dL (ref 6–23)
CO2: 27 mEq/L (ref 19–32)
Calcium: 8.8 mg/dL (ref 8.4–10.5)
Chloride: 108 mEq/L (ref 96–112)
Creatinine, Ser: 0.88 mg/dL (ref 0.50–1.35)
GFR calc Af Amer: >90 mL/min (ref >90)
GFR calc non Af Amer: 89 mL/min — ABNORMAL LOW (ref >90)
Glucose, Bld: 89 mg/dL (ref 70–99)
Potassium: 4.2 mEq/L (ref 3.5–5.1)
Sodium: 142 mEq/L (ref 135–145)
Total Bilirubin: 0.2 mg/dL — ABNORMAL LOW (ref 0.3–1.2)
Total Protein: 6.1 g/dL (ref 6.0–8.3)

## 2011-11-10 LAB — CBC
HCT: 35.5 % — ABNORMAL LOW (ref 39.0–52.0)
Hemoglobin: 12.1 g/dL — ABNORMAL LOW (ref 13.0–17.0)
MCH: 31.4 pg (ref 26.0–34.0)
MCHC: 34.1 g/dL (ref 30.0–36.0)
MCV: 92.2 fL (ref 78.0–100.0)
Platelets: 128 10*3/uL — ABNORMAL LOW (ref 150–400)
RBC: 3.85 MIL/uL — ABNORMAL LOW (ref 4.22–5.81)
RDW: 12.7 % (ref 11.5–15.5)
WBC: 4.6 10*3/uL (ref 4.0–10.5)

## 2011-11-10 LAB — CARDIAC PANEL(CRET KIN+CKTOT+MB+TROPI)
CK, MB: 2.3 ng/mL (ref 0.3–4.0)
Relative Index: 2 (ref 0.0–2.5)
Total CK: 117 U/L (ref 7–232)
Troponin I: <0.3 ng/mL (ref <0.30)

## 2011-11-10 LAB — MAGNESIUM: Magnesium: 2.1 mg/dL (ref 1.5–2.5)

## 2011-11-10 LAB — PROTIME-INR
INR: 1.07 (ref 0.00–1.49)
Prothrombin Time: 14.1 seconds (ref 11.6–15.2)

## 2011-11-10 MED ORDER — IBUPROFEN 800 MG PO TABS: 800.0000 mg | ORAL_TABLET | Freq: Three times a day (TID) | ORAL | Status: AC

## 2011-11-10 MED ORDER — KETOROLAC TROMETHAMINE 30 MG/ML IJ SOLN
30.0000 mg | Freq: Once | INTRAMUSCULAR | Status: AC
  Administered 2011-11-10: 30 mg via INTRAVENOUS
  Filled 2011-11-10: qty 1

## 2011-11-10 MED ORDER — SODIUM CHLORIDE 0.9 % IV SOLN
999.0000 mL | Freq: Once | INTRAVENOUS | Status: AC
  Administered 2011-11-10: 999 mL via INTRAVENOUS

## 2011-11-10 MED ORDER — ASPIRIN 81 MG PO CHEW
324.0000 mg | CHEWABLE_TABLET | Freq: Once | ORAL | Status: AC
  Administered 2011-11-10: 324 mg via ORAL
  Filled 2011-11-10: qty 4

## 2011-11-10 MED ORDER — AZITHROMYCIN 250 MG PO TABS
500.0000 mg | ORAL_TABLET | Freq: Once | ORAL | Status: AC
  Administered 2011-11-10: 500 mg via ORAL
  Filled 2011-11-10: qty 2

## 2011-11-10 MED ORDER — AZITHROMYCIN 250 MG PO TABS: 500.0000 mg | ORAL_TABLET | Freq: Every day | ORAL | Status: AC

## 2011-11-10 MED ORDER — HYDROMORPHONE HCL PF 1 MG/ML IJ SOLN
INTRAMUSCULAR | Status: AC
  Administered 2011-11-10: 0.5 mg via INTRAVENOUS
  Filled 2011-11-10: qty 1

## 2011-11-10 MED ORDER — HYDROMORPHONE HCL PF 1 MG/ML IJ SOLN
0.5000 mg | Freq: Once | INTRAMUSCULAR | Status: AC
  Administered 2011-11-10: 0.5 mg via INTRAVENOUS

## 2011-11-10 NOTE — ED Provider Notes (Signed)
History     CSN: [id number] Arrival date & time: 11/10/2011  1:47 PM   First MD Initiated Contact with Patient 11/10/11 1402      Chief Complaint  Patient presents with  . Chest Pain    Hurting under the left ribs, took codiene and eased the pain, No SOB, N/V, off and on for 2 weeks, worse with exertion, feels like needle stickin gin the heart    (Consider location/radiation/quality/duration/timing/severity/associated sxs/prior treatment) HPI The patient presents with several weeks of chest pain. Symptoms initially began insidiously, and since that time have been spontaneous in onset and offset, lasting a few moments per episode. There is no clear provoking, nor alleviating factors. The pain is described as left-sided tingling, crampiness.  The symptoms have not been worsened by exertion, or deep breathing. Today the patient had an episode of discomfort that was different from multiple prior episodes, and it was more lateral, more inferior, and characteristically more sharp. This pain was neither pleuritic or exertional. It was neither provoked by anything remarkable, nor alleviated by anything, though on arrival the patient has minimal discomfort. Notably the patient has CAD with multiple prior stents. He is scheduled to see his cardiologist in 2 days. No fevers, chills, no cough, no disorientation, no dyspnea, no abdominal pain, no nausea, vomiting. Past Medical History  Diagnosis Date  . Hypertension   . Cholesterolosis     Past Surgical History  Procedure Date  . Coronary angioplasty with stent placement     History reviewed. No pertinent family history.  History  Substance Use Topics  . Smoking status: Never Smoker   . Smokeless tobacco: Not on file  . Alcohol Use: No      Review of Systems  All other systems reviewed and are negative.    Allergies  Vioxx  Home Medications   Current Outpatient Rx  Name Route Sig Dispense Refill  . ASPIRIN 81 MG PO CHEW Oral  Chew 162 mg by mouth daily.      Marland Kitchen DICLOFENAC SODIUM 75 MG PO TBEC Oral Take 75 mg by mouth 2 (two) times daily.      . OMEGA-3 FATTY ACIDS 1000 MG PO CAPS Oral Take 2 g by mouth daily.      Marland Kitchen GABAPENTIN (PHN) PO Oral Take 1 tablet by mouth 3 (three) times daily.      . ISOSORBIDE MONONITRATE ER 60 MG PO TB24 Oral Take 60 mg by mouth daily.      Marland Kitchen LISINOPRIL 5 MG PO TABS Oral Take 10 mg by mouth daily.      Creed Copper M PLUS PO TABS Oral Take 1 tablet by mouth daily.      Marland Kitchen NIACIN CR 500 MG PO TBCR Oral Take 500 mg by mouth 2 (two) times daily with a meal.      . OMEPRAZOLE 20 MG PO CPDR Oral Take 20 mg by mouth 2 (two) times daily.      Marland Kitchen PAROXETINE HCL 40 MG PO TABS Oral Take 40 mg by mouth every morning.      Marland Kitchen POLYSACCHARIDE IRON 150 MG PO CAPS Oral Take 150 mg by mouth daily.      Marland Kitchen PRESCRIPTION MEDICATION       . SIMVASTATIN 40 MG PO TABS Oral Take 40 mg by mouth at bedtime.      . TRAZODONE HCL 100 MG PO TABS Oral Take 100 mg by mouth at bedtime.        BP 119/71  Pulse 52  Temp(Src) 98.1 F (36.7 C) (Oral)  Resp 24  SpO2 98%  Physical Exam  Constitutional: He is oriented to person, place, and time. He appears well-developed and well-nourished.  HENT:  Head: Normocephalic and atraumatic.  Eyes: Conjunctivae are normal. Pupils are equal, round, and reactive to light.  Neck: Neck supple.  Cardiovascular: Normal rate and regular rhythm.   Pulmonary/Chest: No respiratory distress.       There is tenderness to palpation about the left inferior anterior ribs  Abdominal: Soft. There is no tenderness.  Musculoskeletal: He exhibits no edema.  Neurological: He is alert and oriented to person, place, and time.  Skin: Skin is warm and dry.  Psychiatric: He has a normal mood and affect.    ED Course  Procedures (including critical care time)   Labs Reviewed  CBC  CARDIAC PANEL(CRET KIN+CKTOT+MB+TROPI)  COMPREHENSIVE METABOLIC PANEL  MAGNESIUM  PROTIME-INR   No results  found.   No diagnosis found.   Date: 11/10/2011  Rate: 67  Rhythm: normal sinus rhythm  QRS Axis: normal  Intervals: normal  ST/T Wave abnormalities: normal  Conduction Disutrbances:none  Narrative Interpretation:   Old EKG Reviewed: unchanged '   MDM  This generally well appearing male now presents with weeks of chest pain. The chronicity of the pain, the absence of acute findings on EKG or labs his reassuring. However the patient's chest x-ray does demonstrate possible right sided pneumonia. Although this may explain the patient's pain, this would be an unusual presentation for a right-sided pneumonia. However, the patient will be provided antibiotics are to discharge. Other possibilities include pleurisy, or muscle strain. All of these different possibilities were discussed with the patient and his wife. They were provided the opportunity to have the patient stay preparation or go home with antibiotics and pain control, knowing that the patient has a followup appointment previously scheduled in 48 hours. The patient will be discharged with antibiotics and analgesics and instructions to followup as previously scheduled. Explicit return precautions were provided to the patient and his.        Carmin Muskrat, MD 11/10/11 1630

## 2011-11-10 NOTE — ED Notes (Signed)
Family at bedside. 

## 2011-11-10 NOTE — ED Notes (Signed)
Pt placed on cardiac monitor, cont. Pulse ox and 2lt o2 [place].

## 2011-11-29 ENCOUNTER — Encounter (HOSPITAL_COMMUNITY): Payer: Self-pay | Admitting: Emergency Medicine

## 2011-11-29 ENCOUNTER — Emergency Department (HOSPITAL_COMMUNITY)
Admission: EM | Admit: 2011-11-29 | Discharge: 2011-11-29 | Disposition: A | Discharge status: Home / Self Care | Payer: PRIVATE HEALTH INSURANCE | Attending: Emergency Medicine | Admitting: Emergency Medicine

## 2011-11-29 ENCOUNTER — Emergency Department (HOSPITAL_COMMUNITY): Payer: PRIVATE HEALTH INSURANCE

## 2011-11-29 DIAGNOSIS — I10 Essential (primary) hypertension: Secondary | ICD-10-CM | POA: Insufficient documentation

## 2011-11-29 DIAGNOSIS — S62639A Displaced fracture of distal phalanx of unspecified finger, initial encounter for closed fracture: Secondary | ICD-10-CM | POA: Insufficient documentation

## 2011-11-29 DIAGNOSIS — Z9861 Coronary angioplasty status: Secondary | ICD-10-CM | POA: Insufficient documentation

## 2011-11-29 DIAGNOSIS — K824 Cholesterolosis of gallbladder: Secondary | ICD-10-CM | POA: Insufficient documentation

## 2011-11-29 DIAGNOSIS — S62509A Fracture of unspecified phalanx of unspecified thumb, initial encounter for closed fracture: Secondary | ICD-10-CM

## 2011-11-29 DIAGNOSIS — W230XXA Caught, crushed, jammed, or pinched between moving objects, initial encounter: Secondary | ICD-10-CM | POA: Insufficient documentation

## 2011-11-29 DIAGNOSIS — IMO0002 Reserved for concepts with insufficient information to code with codable children: Secondary | ICD-10-CM

## 2011-11-29 DIAGNOSIS — Z7982 Long term (current) use of aspirin: Secondary | ICD-10-CM | POA: Insufficient documentation

## 2011-11-29 DIAGNOSIS — S61209A Unspecified open wound of unspecified finger without damage to nail, initial encounter: Secondary | ICD-10-CM | POA: Insufficient documentation

## 2011-11-29 DIAGNOSIS — Z23 Encounter for immunization: Secondary | ICD-10-CM | POA: Insufficient documentation

## 2011-11-29 MED ORDER — HYDROCODONE-ACETAMINOPHEN 5-325 MG PO TABS: ORAL_TABLET | ORAL | Status: AC

## 2011-11-29 MED ORDER — CEPHALEXIN 500 MG PO CAPS: 500.0000 mg | ORAL_CAPSULE | Freq: Four times a day (QID) | ORAL | Status: AC

## 2011-11-29 MED ORDER — TETANUS-DIPHTH-ACELL PERTUSSIS 5-2.5-18.5 LF-MCG/0.5 IM SUSP
0.5000 mL | Freq: Once | INTRAMUSCULAR | Status: AC
  Administered 2011-11-29: 0.5 mL via INTRAMUSCULAR
  Filled 2011-11-29: qty 0.5

## 2011-11-29 MED ORDER — CEPHALEXIN 500 MG PO CAPS
500.0000 mg | ORAL_CAPSULE | Freq: Once | ORAL | Status: AC
  Administered 2011-11-29: 500 mg via ORAL
  Filled 2011-11-29: qty 1

## 2011-11-29 MED ORDER — BACITRACIN-NEOMYCIN-POLYMYXIN 400-5-5000 EX OINT
TOPICAL_OINTMENT | Freq: Once | CUTANEOUS | Status: AC
  Administered 2011-11-29: 1 via TOPICAL
  Filled 2011-11-29: qty 1

## 2011-11-29 MED ORDER — HYDROCODONE-ACETAMINOPHEN 5-325 MG PO TABS: 1.0000 | ORAL_TABLET | Freq: Once | ORAL | Status: DC

## 2011-11-29 NOTE — ED Notes (Signed)
Dressing applied to patient's right thumb with neosporin, telfa and 3 inch kling. No bleeding noted at this time.

## 2011-11-29 NOTE — ED Notes (Signed)
Pt c/o right thumb injury after having lawnmower fall on it yesterday. Last tetanus unknown.

## 2011-11-29 NOTE — ED Notes (Signed)
Pt has laceration through his right thumb nail bed and thumb. Pt has small amount bleeding at this time. Bruising noted to nailbed. Pt states that he got his thumb caught between a cinder block and lawnmower when his riding lawnmower fell yesterday afternoon. Pt alert and oriented x 3. Skin warm and dry. Color pink.

## 2011-11-29 NOTE — ED Provider Notes (Signed)
History     CSN: [id number] Arrival date & time: 11/29/2011  9:24 AM   First MD Initiated Contact with Patient 11/29/11 [phone]      Chief Complaint  Patient presents with  . Laceration    (Consider location/radiation/quality/duration/timing/severity/associated sxs/prior treatment) HPI Comments: Patient c/o pain and crush injury to the right thumb after a lawn mower fell on his finger.  Also c/o disruption of the nail.  Denies numbness of the finger or other injuries.  Patient is a 65 y.o. male presenting with hand pain. The history is provided by the patient.  Hand Pain This is a new problem. The current episode started yesterday. The problem occurs constantly. The problem has been unchanged. Associated symptoms include arthralgias and joint swelling. Pertinent negatives include no chills, fever, myalgias, neck pain, numbness, rash, sore throat or weakness. Exacerbated by: movmeent and palpation. He has tried nothing for the symptoms. The treatment provided no relief.    Past Medical History  Diagnosis Date  . Hypertension   . Cholesterolosis     Past Surgical History  Procedure Date  . Coronary angioplasty with stent placement     History reviewed. No pertinent family history.  History  Substance Use Topics  . Smoking status: Never Smoker   . Smokeless tobacco: Not on file  . Alcohol Use: No      Review of Systems  Constitutional: Negative for fever and chills.  HENT: Negative for sore throat, trouble swallowing, neck pain and neck stiffness.   Musculoskeletal: Positive for joint swelling and arthralgias. Negative for myalgias and back pain.  Skin: Positive for wound. Negative for rash.  Neurological: Negative for dizziness, weakness, light-headedness and numbness.  Hematological: Does not bruise/bleed easily.  All other systems reviewed and are negative.    Allergies  Vioxx  Home Medications   Current Outpatient Rx  Name Route Sig Dispense Refill  .  ASPIRIN 81 MG PO CHEW Oral Chew 162 mg by mouth daily.     Marland Kitchen DICLOFENAC SODIUM 75 MG PO TBEC Oral Take 75 mg by mouth 2 (two) times daily.      . OMEGA-3 FATTY ACIDS 1000 MG PO CAPS Oral Take 2 g by mouth daily.      Marland Kitchen GABAPENTIN 100 MG PO CAPS Oral Take 100 mg by mouth 3 (three) times daily.      . ISOSORBIDE MONONITRATE ER 60 MG PO TB24 Oral Take 60 mg by mouth daily.      Marland Kitchen LISINOPRIL 5 MG PO TABS Oral Take 10 mg by mouth daily.      Carma Leaven M PLUS PO TABS Oral Take 1 tablet by mouth daily.      Marland Kitchen NIACIN CR 500 MG PO TBCR Oral Take 500 mg by mouth 2 (two) times daily with a meal.      . OMEPRAZOLE 20 MG PO CPDR Oral Take 20 mg by mouth 2 (two) times daily.      Marland Kitchen PAROXETINE HCL 40 MG PO TABS Oral Take 40 mg by mouth every morning.      Marland Kitchen POLYSACCHARIDE IRON 150 MG PO CAPS Oral Take 150 mg by mouth daily.      Marland Kitchen SIMVASTATIN 40 MG PO TABS Oral Take 40 mg by mouth at bedtime.      . TRAZODONE HCL 100 MG PO TABS Oral Take 100 mg by mouth at bedtime.        BP 138/70  Pulse 66  Temp 98.7 F (37.1 C)  Resp  20  Ht 5\' 10"  (1.778 m)  Wt 169 lb (76.658 kg)  BMI 24.25 kg/m2  SpO2 99%  Physical Exam  Nursing note and vitals reviewed. Constitutional: He is oriented to person, place, and time. He appears well-developed and well-nourished. No distress.  HENT:  Head: Normocephalic and atraumatic.  Cardiovascular: Normal rate, regular rhythm and normal heart sounds.   Pulmonary/Chest: Effort normal and breath sounds normal. No respiratory distress. He exhibits no tenderness.  Musculoskeletal: Normal range of motion. He exhibits tenderness. He exhibits no edema.       Hands:      Partial avulsion of the proximal nail of the right thumb.  Mild bleeding present.  Sensation intact.    Neurological: He is alert and oriented to person, place, and time. No cranial nerve deficit. He exhibits normal muscle tone. Coordination normal.  Skin: Skin is warm and dry.    ED Course  SPLINT  APPLICATION Date/Time: 11/29/2011 11:07 AM Performed by: Trisha Mangle, Blandon Offerdahl L. Authorized by: Maxwell Caul Consent: Verbal consent obtained. Written consent not obtained. Consent given by: patient Patient understanding: patient states understanding of the procedure being performed Patient consent: the patient's understanding of the procedure matches consent given Procedure consent: procedure consent matches procedure scheduled Imaging studies: imaging studies available Patient identity confirmed: verbally with patient Time out: Immediately prior to procedure a "time out" was called to verify the correct patient, procedure, equipment, support staff and site/side marked as required. Location details: right thumb Splint type: static finger Supplies used: aluminum splint Post-procedure: The splinted body part was neurovascularly unchanged following the procedure. Patient tolerance: Patient tolerated the procedure well with no immediate complications.   (including critical care time)  Labs Reviewed - No data to displayDg Finger Thumb Right  11/29/2011  *RADIOLOGY REPORT*  Clinical Data: Crush injury.  Pain.  RIGHT THUMB 2+V  Comparison: None.  Findings: There is a comminuted fracture of the distal phalanx in the right thumb.  This does not involve the DIP joint.  Extensive soft tissue swelling is present.  IMPRESSION:  1.  Comminuted subungual distal phalanx fracture in the right thumb. 2.  Diffuse soft tissue swelling without radiopaque foreign body.  Original Report Authenticated By: Jamesetta Orleans. MATTERN, M.D.        MDM     Right thumbnail is avulsed from the proximal end, slight bleeding present but controlled well with pressure.  Sensation in tact distally.  Entire thumb is warm and pink w/o significant edema.  Will leave the nail in place and wound cleaned and dressing applied.  He understands that he will lose his current nail.   Will splint the thumb and patient prefers to  follow-up with Dr. Romeo Apple.  I will give him referral and begin keflex.  Tetanus updated today.    Henritta Mutz L. [place], [place] 11/30/11 2246

## 2011-11-29 NOTE — Discharge Instructions (Signed)
Finger Fracture Fractures of fingers are breaks in the bones of the fingers. There are many types of fractures. There are different ways of treating these fractures, all of which can be correct. Your caregiver will discuss the best way to treat your fracture. TREATMENT  Finger fractures can be treated with:   Non-reduction - this means the bones are in place. The finger is splinted without changing the positions of the bone pieces. The splint is usually left on for about a week to ten days. This will depend on your fracture and what your caregiver thinks.   Closed reduction - the bones are put back into position without using surgery. The finger is then splinted.   ORIF (open reduction and internal fixation) - the fracture site is opened. Then the bone pieces are fixed into place with pins or some type of hardware. This is seldom required. It depends on the severity of the fracture.  Your caregiver will discuss the type of fracture you have and the treatment that will be best for that problem. If surgery is the treatment of choice, the following is information for you to know and also let your caregiver know about prior to surgery. LET YOUR CAREGIVER KNOW ABOUT:  Allergies   Medications taken including herbs, eye drops, over the counter medications, and creams   Use of steroids (by mouth or creams)   Previous problems with anesthetics or Novocaine   Possibility of pregnancy, if this applies   History of blood clots (thrombophlebitis)   History of bleeding or blood problems   Previous surgery   Other health problems  AFTER THE PROCEDURE After surgery, you will be taken to the recovery area where a nurse will check your progress. Once you're awake, stable, and taking fluids well, barring other problems you will be allowed to go home. Once home an ice pack applied to your operative site may help with discomfort and keep the swelling down. HOME CARE INSTRUCTIONS   Follow your  caregiver's instructions as to activities, exercises, physical therapy, and driving a car.   Use your finger and exercise as directed.   Only take over-the-counter or prescription medicines for pain, discomfort, or fever as directed by your caregiver. Do not take aspirin until your caregiver OK's it, as this can increase bleeding immediately following surgery.   Stop using ibuprofen if it upsets your stomach. Let your caregiver know about it.  SEEK MEDICAL CARE IF:  You have increased bleeding (more than a small spot) from the wound or from beneath your splint.   You develop redness, swelling, or increasing pain in the wound or from beneath your splint.   There is pus coming from the wound or from beneath your splint.   An unexplained oral temperature above 102 F (38.9 C) develops, or as your caregiver suggests.   There is a foul smell coming from the wound or dressing or from beneath your splint.  SEEK IMMEDIATE MEDICAL CARE IF:   You develop a rash.   You have difficulty breathing.   You have any allergic problems.  MAKE SURE YOU:   Understand these instructions.   Will watch your condition.   Will get help right away if you are not doing well or get worse.  Document Released: 03/30/2001 Document Revised: 08/28/2011 Document Reviewed: 08/04/2008 [facility] Patient Information 2012 [place].Thumb Fracture  There are many types of thumb fractures (breaks). There are different ways of treating these fractures, all of which may be correct, varying  from case to case. Your caregiver will discuss different ways to treat these fractures with you. TREATMENT   Immobilization. This means the fracture is casted as it is without changing the positions of the fracture (bone pieces) involved. This fracture is casted in a "thumb spica" also called a hitchhiker cast. It is generally left on for 2 to 6 weeks.   Closed reduction. The bones are manipulated back into position without using  surgery.   ORIF (open reduction and internal fixation). The fracture site is opened and the bone pieces are fixed into place with some type of hardware such as screws or wires.  Your caregiver will discuss the type of fracture you have and the treatment that will be best for that problem. If surgery is the treatment of choice, the following is information for you to know and to let your caregiver know about prior to surgery. LET YOUR CAREGIVERS KNOW ABOUT:  Allergies.   Medications taken including herbs, eye drops, over the counter medications, and creams.   Use of steroids (by mouth or creams).   Previous problems with anesthetics or Novocain.   Family history of anesthetic complications..   Possibility of pregnancy, if this applies.   History of blood clots (thrombophlebitis).   History of bleeding or blood problems.   Previous surgery.   Other health problems.  AFTER THE PROCEDURE  After surgery, you will be taken to the recovery area. A nurse will watch and check your progress. Once you are awake, stable, and taking fluids well, barring other problems you will be allowed to go home. Once home, an ice pack applied to your operative site may help with discomfort and keep the swelling down. Elevate your hand above your heart as much as possible for the first 4-5 days after the injury/surgery. HOME CARE INSTRUCTIONS   Follow your caregiver's instructions as to activities, exercises, physical therapy, and driving a car.   Use thumb and exercise as directed.   Only take over-the-counter or prescription medicines for pain, discomfort, or fever as directed by your caregiver. Do not take aspirin until your caregiver instructs. This can increase bleeding immediately following surgery.  SEEK MEDICAL CARE IF:   There is increased bleeding (more than a small spot) from the wound or from beneath your cast or splint.   There is redness, swelling, or increasing pain in the wound or from  beneath your cast or splint.   You have pus coming from wound or from beneath your cast or splint.   An unexplained oral temperature above 102 F (38.9 C) develops.   There is a foul smell coming from the wound or dressing or from beneath your cast or splint.  SEEK IMMEDIATE MEDICAL CARE IF:   You develop severe pain, decreased sensation such as numbness or tingling.   You develop a rash.   You have difficulty breathing.   Youhave any allergic problems.  If you do not have a window in your cast for observing the wound, a discharge or minor bleeding may show up as a stain on the outside of your cast. Report these findings to your caregiver. If you have a removable splint overlying the surgical dressings it is common to see a small amount of bleeding. Change the dressings as instructed by your caregiver. Document Released: 09/14/2003 Document Revised: 08/28/2011 Document Reviewed: 04/25/2008 [facility] Patient Information 2012 [place].

## 2011-12-01 NOTE — ED Provider Notes (Signed)
Medical screening examination/treatment/procedure(s) were performed by non-physician practitioner and as supervising physician I was immediately available for consultation/collaboration.    Shelda Jakes, MD 12/01/11 1236

## 2011-12-03 ENCOUNTER — Ambulatory Visit (INDEPENDENT_AMBULATORY_CARE_PROVIDER_SITE_OTHER): Payer: PRIVATE HEALTH INSURANCE | Admitting: Orthopedic Surgery

## 2011-12-03 ENCOUNTER — Encounter: Payer: Self-pay | Admitting: Orthopedic Surgery

## 2011-12-03 VITALS — BP 132/80 | Ht 70.0 in | Wt 169.0 lb

## 2011-12-03 DIAGNOSIS — S62609A Fracture of unspecified phalanx of unspecified finger, initial encounter for closed fracture: Secondary | ICD-10-CM

## 2011-12-03 DIAGNOSIS — S62509A Fracture of unspecified phalanx of unspecified thumb, initial encounter for closed fracture: Secondary | ICD-10-CM

## 2011-12-03 NOTE — Patient Instructions (Signed)
Finish the antibiotic   Change band aid daily

## 2011-12-03 NOTE — Progress Notes (Signed)
Chief complaint pain, RIGHT thumb.  Date of injury was Thursday lawnmower fell on her RIGHT thumb. Initial evaluation emergency room. He does have a nondisplaced comminuted fracture of the distal phalanx of the thumb. He is complaining of 5/10. Constant, sharp pain with swelling, but is not requiring medication at this time. He does take Keflex, which is not have any trouble with. His review of systems is negative, except for some nervousness, and easy bleeding.  Exam shows a healthy, well-developed, well-nourished, male coming in hygiene are intact. The RIGHT thumb is lacerated over the top of the nail. There is bleeding under the nail. The flexion of the joint is normal. Flexion power normal extension power normal. No sensory deficits. Good color and capillary refill.  Impression closed fracture, distal phalanx, RIGHT thumb.  Recommend dressing changes, and two-week followup. Finish all antibiotics

## 2011-12-17 ENCOUNTER — Ambulatory Visit (INDEPENDENT_AMBULATORY_CARE_PROVIDER_SITE_OTHER): Payer: PRIVATE HEALTH INSURANCE | Admitting: Orthopedic Surgery

## 2011-12-17 ENCOUNTER — Encounter: Payer: Self-pay | Admitting: Orthopedic Surgery

## 2011-12-17 VITALS — BP 104/70 | Ht 70.0 in | Wt 169.0 lb

## 2011-12-17 DIAGNOSIS — S62609A Fracture of unspecified phalanx of unspecified finger, initial encounter for closed fracture: Secondary | ICD-10-CM

## 2011-12-17 DIAGNOSIS — S62509A Fracture of unspecified phalanx of unspecified thumb, initial encounter for closed fracture: Secondary | ICD-10-CM

## 2011-12-17 NOTE — Progress Notes (Signed)
Patient ID: Cory [name], male   DOB: [birth year], 64 y.o.   MRN: [id number] Followup visit. LEFT thumb tip injury. The nail was come off the base of the nail is normal. The patient has normal flexion at the interphalangeal joint and has been given instructions to resume normal activity

## 2011-12-17 NOTE — Patient Instructions (Signed)
No specific instructions 

## 2012-01-07 ENCOUNTER — Encounter (HOSPITAL_COMMUNITY): Payer: Self-pay | Admitting: *Deleted

## 2012-01-07 ENCOUNTER — Encounter (HOSPITAL_COMMUNITY): Payer: Self-pay | Admitting: Emergency Medicine

## 2012-01-07 ENCOUNTER — Inpatient Hospital Stay (HOSPITAL_COMMUNITY)
Admission: EM | Admit: 2012-01-07 | Discharge: 2012-01-11 | DRG: 514 | Disposition: A | Discharge status: Home / Self Care | Payer: Non-veteran care | Source: Ambulatory Visit | Attending: Orthopedic Surgery | Admitting: Orthopedic Surgery

## 2012-01-07 ENCOUNTER — Emergency Department (HOSPITAL_COMMUNITY)
Admission: EM | Admit: 2012-01-07 | Discharge: 2012-01-07 | Disposition: A | Discharge status: Home / Self Care | Payer: PRIVATE HEALTH INSURANCE | Attending: Emergency Medicine | Admitting: Emergency Medicine

## 2012-01-07 DIAGNOSIS — L02519 Cutaneous abscess of unspecified hand: Secondary | ICD-10-CM | POA: Insufficient documentation

## 2012-01-07 DIAGNOSIS — I2510 Atherosclerotic heart disease of native coronary artery without angina pectoris: Secondary | ICD-10-CM | POA: Diagnosis present

## 2012-01-07 DIAGNOSIS — I10 Essential (primary) hypertension: Secondary | ICD-10-CM | POA: Diagnosis present

## 2012-01-07 DIAGNOSIS — M79609 Pain in unspecified limb: Secondary | ICD-10-CM | POA: Insufficient documentation

## 2012-01-07 DIAGNOSIS — M7989 Other specified soft tissue disorders: Secondary | ICD-10-CM | POA: Insufficient documentation

## 2012-01-07 DIAGNOSIS — Z79899 Other long term (current) drug therapy: Secondary | ICD-10-CM | POA: Insufficient documentation

## 2012-01-07 DIAGNOSIS — M2550 Pain in unspecified joint: Secondary | ICD-10-CM | POA: Insufficient documentation

## 2012-01-07 DIAGNOSIS — L03019 Cellulitis of unspecified finger: Secondary | ICD-10-CM | POA: Diagnosis present

## 2012-01-07 DIAGNOSIS — R609 Edema, unspecified: Secondary | ICD-10-CM | POA: Insufficient documentation

## 2012-01-07 DIAGNOSIS — F4310 Post-traumatic stress disorder, unspecified: Secondary | ICD-10-CM | POA: Diagnosis present

## 2012-01-07 DIAGNOSIS — IMO0002 Reserved for concepts with insufficient information to code with codable children: Secondary | ICD-10-CM

## 2012-01-07 DIAGNOSIS — M2540 Effusion, unspecified joint: Secondary | ICD-10-CM | POA: Insufficient documentation

## 2012-01-07 DIAGNOSIS — M65839 Other synovitis and tenosynovitis, unspecified forearm: Principal | ICD-10-CM | POA: Diagnosis present

## 2012-01-07 DIAGNOSIS — Z7982 Long term (current) use of aspirin: Secondary | ICD-10-CM | POA: Insufficient documentation

## 2012-01-07 DIAGNOSIS — L02511 Cutaneous abscess of right hand: Secondary | ICD-10-CM

## 2012-01-07 HISTORY — DX: Post-traumatic stress disorder, unspecified: F43.10

## 2012-01-07 HISTORY — DX: Pneumonia, unspecified organism: J18.9

## 2012-01-07 HISTORY — DX: Atherosclerotic heart disease of native coronary artery without angina pectoris: I25.10

## 2012-01-07 LAB — DIFFERENTIAL
Basophils Absolute: 0 10*3/uL (ref 0.0–0.1)
Basophils Relative: 0 % (ref 0–1)
Eosinophils Absolute: 0.1 10*3/uL (ref 0.0–0.7)
Eosinophils Relative: 1 % (ref 0–5)
Lymphocytes Relative: 11 % — ABNORMAL LOW (ref 12–46)
Lymphs Abs: 1.6 10*3/uL (ref 0.7–4.0)
Monocytes Absolute: 1.4 10*3/uL — ABNORMAL HIGH (ref 0.1–1.0)
Monocytes Relative: 10 % (ref 3–12)
Neutro Abs: 10.7 10*3/uL — ABNORMAL HIGH (ref 1.7–7.7)
Neutrophils Relative %: 78 % — ABNORMAL HIGH (ref 43–77)

## 2012-01-07 LAB — CBC
HCT: 36.2 % — ABNORMAL LOW (ref 39.0–52.0)
Hemoglobin: 12.5 g/dL — ABNORMAL LOW (ref 13.0–17.0)
MCH: 30.9 pg (ref 26.0–34.0)
MCHC: 34.5 g/dL (ref 30.0–36.0)
MCV: 89.4 fL (ref 78.0–100.0)
Platelets: 177 10*3/uL (ref 150–400)
RBC: 4.05 MIL/uL — ABNORMAL LOW (ref 4.22–5.81)
RDW: 12.2 % (ref 11.5–15.5)
WBC: 13.8 10*3/uL — ABNORMAL HIGH (ref 4.0–10.5)

## 2012-01-07 MED ORDER — VANCOMYCIN HCL IN DEXTROSE 1-5 GM/200ML-% IV SOLN
1000.0000 mg | Freq: Once | INTRAVENOUS | Status: AC
  Administered 2012-01-07: 1000 mg via INTRAVENOUS
  Filled 2012-01-07: qty 200

## 2012-01-07 MED ORDER — HYDROMORPHONE HCL PF 1 MG/ML IJ SOLN
1.0000 mg | Freq: Once | INTRAMUSCULAR | Status: AC
  Administered 2012-01-07: 1 mg via INTRAVENOUS
  Filled 2012-01-07: qty 1

## 2012-01-07 MED ORDER — HYDROMORPHONE HCL PF 1 MG/ML IJ SOLN
0.5000 mg | Freq: Once | INTRAMUSCULAR | Status: AC
  Administered 2012-01-07: 0.5 mg via INTRAVENOUS
  Filled 2012-01-07: qty 1

## 2012-01-07 NOTE — ED Notes (Signed)
Seen at [facility] yesterday for thumb infection Feels it is getting worse and painful

## 2012-01-07 NOTE — ED Notes (Signed)
Pt sent from [facility] hosp to see Dr Amanda Pea r/t  abscess

## 2012-01-07 NOTE — ED Notes (Signed)
Per Cory [name] Pt is to be d/c'd with IV intact. Pt is to go directly to [facility] ED via private vehicle.

## 2012-01-07 NOTE — ED Provider Notes (Signed)
Patient arrived from [facility] to see Dr. Amanda Pea for evaluation and treatment of right thumb abscess and cellulitis.  Patient had received one gram of vancomycin prior to departure from [facility].  Patient conscious, responsive, resting comfortably with family at bedside.  Dorsal surface of right thumb with erythema and abscess proximal phalanx.  S1/S2, RRR, no murmur.  Diffuse wheezing noted to auscultation.  Abdomen soft, bowel sounds present.  Distal PMS intact all extremities.  Dr. Amanda Pea here to see patient.  Jimmye Norman, NP 01/07/12 2248

## 2012-01-07 NOTE — ED Notes (Signed)
Pt presents from [facility] to be seen by dr Amanda Pea for eval of abcess/cellulitis to rt thumb. Pt states sx began on Sunday, was given abx by [place] md on Monday. States erythema began spreading into hand today. Cms/rom remains intact but mvt painful. Pt in no acute distress, a&ox4.

## 2012-01-07 NOTE — ED Provider Notes (Signed)
Medical screening examination/treatment/procedure(s) were performed by non-physician practitioner and as supervising physician I was immediately available for consultation/collaboration.   Tal Kempker A. Patrica Duel, MD 01/07/12 2352

## 2012-01-07 NOTE — ED Provider Notes (Signed)
Medical screening examination/treatment/procedure(s) were conducted as a shared visit with non-physician practitioner(s) and myself.  I personally evaluated the patient during the encounter Pt with swelling and tendernous to right thumb.  Dx cellulitis right hand  Benny Lennert, MD 01/07/12 2115

## 2012-01-07 NOTE — ED Provider Notes (Signed)
History     CSN: [id number]  Arrival date & time 01/07/12  1620   First MD Initiated Contact with Patient 01/07/12 1727      Chief Complaint  Patient presents with  . Cellulitis    (Consider location/radiation/quality/duration/timing/severity/associated sxs/prior treatment) HPI Comments: Patient c/o increasing pain, swelling to his right proximal thumb that began 3 days ago. He states it began as a "bump".  He was seen by his PMD at the [facility] yesterday and started on Cipro.  He began having increased pain and redness to his dorsal hand today at 2:00 pm.  Redness now extends to his wrist.  He states pain is worse with movement of the thumb  Patient is a 66 y.o. male presenting with hand pain. The history is provided by the patient.  Hand Pain This is a new problem. The current episode started in the past 7 days. The problem occurs constantly. The problem has been gradually worsening. Associated symptoms include arthralgias and joint swelling. Pertinent negatives include no chills, fever, nausea, numbness, swollen glands, vomiting or weakness. Exacerbated by: movement, use and palpation. Treatments tried: oral antibiotics. The treatment provided no relief.    Past Medical History  Diagnosis Date  . Hypertension   . Cholesterolosis   . Coronary artery disease   . Pneumonia   . PTSD (post-traumatic stress disorder)     Past Surgical History  Procedure Date  . Coronary angioplasty with stent placement   . Shoulder surgery   . Knee surgery   . Tonsillectomy     History reviewed. No pertinent family history.  History  Substance Use Topics  . Smoking status: Never Smoker   . Smokeless tobacco: Not on file  . Alcohol Use: No      Review of Systems  Constitutional: Negative for fever and chills.  Gastrointestinal: Negative for nausea and vomiting.  Musculoskeletal: Positive for joint swelling and arthralgias.  Skin: Positive for color change.  Neurological: Negative for  weakness and numbness.  Hematological: Negative for adenopathy. Does not bruise/bleed easily.  All other systems reviewed and are negative.    Allergies  Benadryl; Benadryl; and Vioxx  Home Medications   Current Outpatient Rx  Name Route Sig Dispense Refill  . ASPIRIN EC 81 MG PO TBEC Oral Take 162 mg by mouth at bedtime.      Marland Kitchen CIPROFLOXACIN HCL 500 MG PO TABS Oral Take 500 mg by mouth 2 (two) times daily. For 10 days     . DICLOFENAC SODIUM 75 MG PO TBEC Oral Take 75 mg by mouth 2 (two) times daily.      . OMEGA-3 FATTY ACIDS 1000 MG PO CAPS Oral Take 2 g by mouth daily.      Marland Kitchen GABAPENTIN 100 MG PO CAPS Oral Take 100 mg by mouth 3 (three) times daily.      Marland Kitchen HYDROCODONE-ACETAMINOPHEN 5-325 MG PO TABS Oral Take 1 tablet by mouth Every 4 hours as needed. For pain    . ISOSORBIDE MONONITRATE ER 60 MG PO TB24 Oral Take 60 mg by mouth daily.      Marland Kitchen LISINOPRIL 5 MG PO TABS Oral Take 10 mg by mouth daily.      Carma Leaven M PLUS PO TABS Oral Take 1 tablet by mouth daily.      Marland Kitchen NIACIN ER 500 MG PO TBCR Oral Take 1,000 mg by mouth at bedtime.     . OMEPRAZOLE 20 MG PO CPDR Oral Take 20 mg by mouth 2 (  two) times daily.      Marland Kitchen PAROXETINE HCL 40 MG PO TABS Oral Take 40 mg by mouth every morning.      Marland Kitchen POLYSACCHARIDE IRON 150 MG PO CAPS Oral Take 150 mg by mouth daily.      Marland Kitchen PREDNISONE 10 MG PO TABS Oral Take 10 mg by mouth daily. Take four tablets daily for 4 days, then take two tablets daily for 2 days, then take one tablet daily for 2 days, then take one-half tablet daily for 2 days, then stop     . SIMVASTATIN 40 MG PO TABS Oral Take 40 mg by mouth at bedtime.      . TRAZODONE HCL 100 MG PO TABS Oral Take 100 mg by mouth at bedtime.        BP 123/84  Pulse 76  Temp(Src) 97.4 F (36.3 C) (Oral)  Resp 18  Ht 5\' 9"  (1.753 m)  Wt 170 lb (77.111 kg)  BMI 25.10 kg/m2  SpO2 97%  Physical Exam  Nursing note and vitals reviewed. Constitutional: He is oriented to person, place, and time. He  appears well-developed and well-nourished. No distress.  HENT:  Head: Normocephalic.  Eyes: EOM are normal. Pupils are equal, round, and reactive to light.  Cardiovascular: Normal rate, regular rhythm and normal heart sounds.   Pulmonary/Chest: Effort normal and breath sounds normal.  Musculoskeletal: He exhibits edema and tenderness.       Right hand: He exhibits decreased range of motion, tenderness and swelling. He exhibits normal capillary refill. normal sensation noted. Normal strength noted.       Hands: Neurological: He is oriented to person, place, and time.  Skin: Skin is warm. There is erythema.       See MS exam    ED Course  Procedures (including critical care time)  Labs Reviewed  CBC - Abnormal; Notable for the following:    WBC 13.8 (*)    RBC 4.05 (*)    Hemoglobin 12.5 (*)    HCT 36.2 (*)    All other components within normal limits  DIFFERENTIAL - Abnormal; Notable for the following:    Neutrophils Relative 78 (*)    Neutro Abs 10.7 (*)    Lymphocytes Relative 11 (*)    Monocytes Absolute 1.4 (*)    All other components within normal limits        MDM    I have consulted Dr. Amanda Pea.  He agrees to see the patient in the ED at [facility] for probable I&D of abscess to the right thumb.    I have consulted Dr. Patrica Duel and Felicie Morn, NP at [facility] to notify them of the patient's arrival to the ED and that Dr. Amanda Pea to see him in the ED.         Cory [name], [place] 01/07/12 2201

## 2012-01-07 NOTE — ED Notes (Signed)
Pt a/ox4. Resp even and unlabored. NAD at this time. D/C instructions reviewed with pt. Pt verbalized understanding. Pt ambulated to lobby with steady gate.  

## 2012-01-08 LAB — DIFFERENTIAL
Basophils Absolute: 0 10*3/uL (ref 0.0–0.1)
Basophils Relative: 0 % (ref 0–1)
Eosinophils Absolute: 0 10*3/uL (ref 0.0–0.7)
Eosinophils Relative: 0 % (ref 0–5)
Lymphocytes Relative: 6 % — ABNORMAL LOW (ref 12–46)
Lymphs Abs: 1 10*3/uL (ref 0.7–4.0)
Monocytes Absolute: 1.1 10*3/uL — ABNORMAL HIGH (ref 0.1–1.0)
Monocytes Relative: 7 % (ref 3–12)
Neutro Abs: 14.1 10*3/uL — ABNORMAL HIGH (ref 1.7–7.7)
Neutrophils Relative %: 87 % — ABNORMAL HIGH (ref 43–77)

## 2012-01-08 LAB — CBC
HCT: 37.7 % — ABNORMAL LOW (ref 39.0–52.0)
Hemoglobin: 13.1 g/dL (ref 13.0–17.0)
MCH: 31.4 pg (ref 26.0–34.0)
MCHC: 34.7 g/dL (ref 30.0–36.0)
MCV: 90.4 fL (ref 78.0–100.0)
Platelets: 192 10*3/uL (ref 150–400)
RBC: 4.17 MIL/uL — ABNORMAL LOW (ref 4.22–5.81)
RDW: 12.6 % (ref 11.5–15.5)
WBC: 16.2 10*3/uL — ABNORMAL HIGH (ref 4.0–10.5)

## 2012-01-08 LAB — WOUND CULTURE

## 2012-01-08 LAB — BASIC METABOLIC PANEL
BUN: 17 mg/dL (ref 6–23)
CO2: 24 mEq/L (ref 19–32)
Calcium: 9.4 mg/dL (ref 8.4–10.5)
Chloride: 102 mEq/L (ref 96–112)
Creatinine, Ser: 0.75 mg/dL (ref 0.50–1.35)
GFR calc Af Amer: >90 mL/min (ref >90)
GFR calc non Af Amer: >90 mL/min (ref >90)
Glucose, Bld: 151 mg/dL — ABNORMAL HIGH (ref 70–99)
Potassium: 4.1 mEq/L (ref 3.5–5.1)
Sodium: 137 mEq/L (ref 135–145)

## 2012-01-08 MED ORDER — TRAZODONE HCL 100 MG PO TABS
100.0000 mg | ORAL_TABLET | Freq: Every evening | ORAL | Status: DC | PRN
  Filled 2012-01-08: qty 1

## 2012-01-08 MED ORDER — PREDNISONE 5 MG PO TABS: 5.0000 mg | ORAL_TABLET | Freq: Once | ORAL | Status: DC

## 2012-01-08 MED ORDER — PREDNISONE 10 MG PO TABS
15.0000 mg | ORAL_TABLET | Freq: Once | ORAL | Status: AC
  Administered 2012-01-11: 15 mg via ORAL
  Filled 2012-01-08: qty 1

## 2012-01-08 MED ORDER — MORPHINE SULFATE 2 MG/ML IJ SOLN
1.0000 mg | INTRAMUSCULAR | Status: DC | PRN
  Administered 2012-01-08 – 2012-01-10 (×4): 2 mg via INTRAVENOUS
  Administered 2012-01-11: 4 mg via INTRAVENOUS
  Filled 2012-01-08 (×5): qty 1
  Filled 2012-01-08: qty 2

## 2012-01-08 MED ORDER — LACTATED RINGERS IV SOLN
INTRAVENOUS | Status: DC
  Administered 2012-01-08 – 2012-01-10 (×4): via INTRAVENOUS

## 2012-01-08 MED ORDER — LISINOPRIL 10 MG PO TABS
10.0000 mg | ORAL_TABLET | Freq: Every day | ORAL | Status: DC
  Administered 2012-01-08 – 2012-01-11 (×4): 10 mg via ORAL
  Filled 2012-01-08 (×4): qty 1

## 2012-01-08 MED ORDER — VANCOMYCIN HCL IN DEXTROSE 1-5 GM/200ML-% IV SOLN
1000.0000 mg | Freq: Two times a day (BID) | INTRAVENOUS | Status: DC
  Administered 2012-01-08 – 2012-01-10 (×6): 1000 mg via INTRAVENOUS
  Filled 2012-01-08 (×7): qty 200

## 2012-01-08 MED ORDER — ALPRAZOLAM 0.5 MG PO TABS
1.0000 mg | ORAL_TABLET | Freq: Four times a day (QID) | ORAL | Status: DC | PRN
  Administered 2012-01-10: 1 mg via ORAL
  Administered 2012-01-10: 0.5 mg via ORAL
  Administered 2012-01-11: 1 mg via ORAL
  Filled 2012-01-08: qty 1
  Filled 2012-01-08 (×2): qty 2

## 2012-01-08 MED ORDER — NIACIN ER 500 MG PO CPCR
1000.0000 mg | ORAL_CAPSULE | Freq: Every day | ORAL | Status: DC
  Administered 2012-01-08 – 2012-01-10 (×3): 1000 mg via ORAL
  Filled 2012-01-08 (×4): qty 2

## 2012-01-08 MED ORDER — PIPERACILLIN-TAZOBACTAM 3.375 G IVPB 30 MIN
3.3750 g | Freq: Once | INTRAVENOUS | Status: AC
  Administered 2012-01-08: 3.375 g via INTRAVENOUS
  Filled 2012-01-08: qty 50

## 2012-01-08 MED ORDER — PREDNISONE 10 MG PO TABS: 10.0000 mg | ORAL_TABLET | Freq: Once | ORAL | Status: DC

## 2012-01-08 MED ORDER — PIPERACILLIN-TAZOBACTAM 3.375 G IVPB
3.3750 g | Freq: Three times a day (TID) | INTRAVENOUS | Status: DC
  Administered 2012-01-08 – 2012-01-10 (×7): 3.375 g via INTRAVENOUS
  Filled 2012-01-08 (×10): qty 50

## 2012-01-08 MED ORDER — ASPIRIN EC 81 MG PO TBEC
162.0000 mg | DELAYED_RELEASE_TABLET | Freq: Every day | ORAL | Status: DC
  Administered 2012-01-08 – 2012-01-10 (×3): 162 mg via ORAL
  Filled 2012-01-08 (×4): qty 2

## 2012-01-08 MED ORDER — PREDNISONE 20 MG PO TABS
20.0000 mg | ORAL_TABLET | Freq: Once | ORAL | Status: AC
  Administered 2012-01-10: 20 mg via ORAL
  Filled 2012-01-08: qty 1

## 2012-01-08 MED ORDER — SENNOSIDES-DOCUSATE SODIUM 8.6-50 MG PO TABS
1.0000 | ORAL_TABLET | Freq: Two times a day (BID) | ORAL | Status: DC
  Administered 2012-01-08 – 2012-01-11 (×9): 1 via ORAL
  Filled 2012-01-08 (×8): qty 1

## 2012-01-08 MED ORDER — PREDNISONE 10 MG PO TABS
30.0000 mg | ORAL_TABLET | Freq: Once | ORAL | Status: AC
  Administered 2012-01-09: 30 mg via ORAL
  Filled 2012-01-08: qty 1

## 2012-01-08 MED ORDER — ISOSORBIDE MONONITRATE ER 60 MG PO TB24
60.0000 mg | ORAL_TABLET | Freq: Every day | ORAL | Status: DC
  Administered 2012-01-08 – 2012-01-11 (×4): 60 mg via ORAL
  Filled 2012-01-08 (×4): qty 1

## 2012-01-08 MED ORDER — HYDROCODONE-ACETAMINOPHEN 5-325 MG PO TABS
ORAL_TABLET | ORAL | Status: AC
  Filled 2012-01-08: qty 2

## 2012-01-08 MED ORDER — VITAMIN C 500 MG PO TABS
1000.0000 mg | ORAL_TABLET | Freq: Every day | ORAL | Status: DC
  Administered 2012-01-08 – 2012-01-11 (×4): 1000 mg via ORAL
  Filled 2012-01-08 (×4): qty 2

## 2012-01-08 MED ORDER — GABAPENTIN 600 MG PO TABS
300.0000 mg | ORAL_TABLET | Freq: Three times a day (TID) | ORAL | Status: DC
  Administered 2012-01-08 (×3): 300 mg via ORAL
  Filled 2012-01-08 (×6): qty 0.5

## 2012-01-08 MED ORDER — PAROXETINE HCL 20 MG PO TABS
20.0000 mg | ORAL_TABLET | Freq: Every day | ORAL | Status: DC
  Administered 2012-01-08 – 2012-01-11 (×4): 20 mg via ORAL
  Filled 2012-01-08 (×4): qty 1

## 2012-01-08 MED ORDER — HYDROCODONE-ACETAMINOPHEN 5-325 MG PO TABS
ORAL_TABLET | ORAL | Status: AC
  Filled 2012-01-08: qty 1

## 2012-01-08 MED ORDER — SIMVASTATIN 40 MG PO TABS
40.0000 mg | ORAL_TABLET | Freq: Every day | ORAL | Status: DC
  Administered 2012-01-08 – 2012-01-10 (×3): 40 mg via ORAL
  Filled 2012-01-08 (×4): qty 1

## 2012-01-08 MED ORDER — METHOCARBAMOL 500 MG PO TABS
500.0000 mg | ORAL_TABLET | Freq: Four times a day (QID) | ORAL | Status: DC | PRN
  Administered 2012-01-10 – 2012-01-11 (×2): 500 mg via ORAL
  Filled 2012-01-08 (×2): qty 1

## 2012-01-08 NOTE — ED Provider Notes (Signed)
Medical screening examination/treatment/procedure(s) were performed by non-physician practitioner and as supervising physician I was immediately available for consultation/collaboration.   Benny Lennert, MD 01/08/12 1729

## 2012-01-08 NOTE — Progress Notes (Addendum)
PHARMACY - ANTIBIOTIC CONSULT  INITIAL NOTE  Pharmacy Consult for: Vancomycin and Zosyn  Indication: R thumb abscess and cellulitis   Patient Data:   Allergies: Allergies  Allergen Reactions  . Benadryl (Altaryl)   . Benadryl (Banophen)   . Vioxx (Rofecoxib) Other (See Comments)    headache    Patient Measurements: Height: 5' 9.5" (176.5 cm) Weight: 170 lb (77.111 kg) IBW/kg (Calculated) : 71.85  Adjusted Body Weight:   Vital Signs: Temp:  [97.4 F (36.3 C)-97.6 F (36.4 C)] 97.5 F (36.4 C) (01/08 2240) Pulse Rate:  [64-76] 66  (01/08 2240) Resp:  [16-20] 20  (01/08 2240) BP: (123-160)/(72-84) 160/72 mmHg (01/08 2240) SpO2:  [93 %-97 %] 93 % (01/08 2240) Weight:  [170 lb (77.111 kg)] 170 lb (77.111 kg) (01/09 0100)  Intake/Output from previous day: No intake or output data in the 24 hours ending 01/08/12 0218  Labs:  Basename 01/07/12 1859  WBC 13.8*  HGB 12.5*  PLT 177  LABCREA --  CREATININE --   Estimated Creatinine Clearance: 86.2 ml/min (by C-G formula based on Cr of 0.88). No results found for this basename: VANCOTROUGH:2,VANCOPEAK:2,VANCORANDOM:2,GENTTROUGH:2,GENTPEAK:2,GENTRANDOM:2,TOBRATROUGH:2,TOBRAPEAK:2,TOBRARND:2,AMIKACINPEAK:2,AMIKACINTROU:2,AMIKACIN:2, in the last 72 hours   Microbiology: No results found for this or any previous visit (from the past 720 hour(s)).  Medical History: Past Medical History  Diagnosis Date  . Hypertension   . Cholesterolosis   . Coronary artery disease   . Pneumonia   . PTSD (post-traumatic stress disorder)     Scheduled Medications:     . HYDROcodone-acetaminophen      . HYDROcodone-acetaminophen          Assessment:  66 y.o. male admitted on 01/07/2012, with R thumb abscess and cellulitis. Pharmacy consulted to manage vancomycin and Zosyn. Patient received 1 gm of Vancomycin earlier tonight.    Goal of Therapy:  1. Vancomycin trough level 10-15 mcg/ml  Plan:  1. Zosyn 3.375gm IV x 1 (30 minute  infusion) 2. BMET 3. Further dosing of antibiotics to be determined after renal function known.    Dineen Kid Halston Fairclough, PharmD 01/08/2012, 2:18 AM  Addendum: 01/08/12 3:31 AM. Est CrCl of 95 ml/min. Will dose Zosyn 3.375gm IV Q8H (4 hr infusion) and Vancomycin 1gm IV Q12H.   Lorre Munroe, PharmD

## 2012-01-08 NOTE — Progress Notes (Signed)
Subjective:     Patient reports pain as mild. Patient notes that he feels fairly well. His fingers are moving nicely. He notes no locking popping catching or numbness in the fingers. His thumb feels better. He was able to get a little bit of sleep last night. He denies neck back chest abdominal pain. He denies shortness of breath. He denies lower extremity complaints.   Objective: Vital signs in last 24 hours: Temp:  [97.4 F (36.3 C)-98.3 F (36.8 C)] 98.3 F (36.8 C) (01/09 0554) Pulse Rate:  [57-76] 71  (01/09 0554) Resp:  [16-20] 18  (01/09 0554) BP: (94-160)/(52-84) 140/70 mmHg (01/09 0637) SpO2:  [93 %-97 %] 94 % (01/09 0554) Weight:  [77.111 kg (170 lb)] 77.111 kg (170 lb) (01/09 0100)  Intake/Output from previous day:   Intake/Output this shift:     Basename 01/07/12 1859  HGB 12.5*    Basename 01/07/12 1859  WBC 13.8*  RBC 4.05*  HCT 36.2*  PLT 177    Basename 01/08/12 0231  NA 137  K 4.1  CL 102  CO2 24  BUN 17  CREATININE 0.75  GLUCOSE 151*  CALCIUM 9.4   No results found for this basename: LABPT:2,INR:2 in the last 72 hours  Neurologically intact ABD soft Neurovascular intact Sensation intact distally Compartment soft I reviewed his examination at length we are going to plan for will pulls today and continued IV antibiotic therapy. The patient I discussed these issues in great detail. We have reviewed his medicines and will continue his regular home medicines. Given his recent prednisone administration we will continue this as well in a tapering fashion.  Assessment/Plan:     we will have therapy see for WaterPik/whirlpool with the Penrose drain to be left in today. On 01/09/2012 we'll remove the drain and begin wet-to-dry dressing changes and bedside teaching. I've asked the patient to continue range of motion elevation and call for any problems. All questions have been encouraged and answered. Our goal is to initiate wound care continued antibiotics  and await his cultures. He may be out of bed as tolerated in understands this. Advance diet Up with therapy  Karen Chafe 01/08/2012, 7:37 AM

## 2012-01-08 NOTE — H&P (Signed)
Cory [name], Cory [name]              ACCOUNT NO.:  [id number]  MEDICAL RECORD NO.:  [id number]  LOCATION:  [id number]                        FACILITY:  [facility]  PHYSICIAN:  Dionne Ano. Muaad Boehning, M.D.DATE OF BIRTH:  10-22-1946  DATE OF ADMISSION:  01/07/2012 DATE OF DISCHARGE:  01/07/2012                             HISTORY & PHYSICAL   HISTORY OF PRESENT ILLNESS:  I had the pleasure of seeing Cory [name] today.  This patient is a 66 year old male who has had progressive right thumb pain.  He was seen at the [facility] 2 days ago, as well as 1 day ago.  He was placed on Cipro.  He has taken a few doses of this.  His pain began increasing in its severity and thus he presented to [facility].  He was seen in [facility] for approximately 4-5 hours and following this visit, I was called as Dr. Estell Harpin felt that he needed hand surgical care consultation.  He was transferred to [facility] from [facility] via private vehicle.  The patient is here with his wife.  He is a pleasant male.  He arrived sometime around 11 p.m., and we saw him as promptly as possible.  The patient is noting pain.  He states this began after he scratched it.  He denies obvious inciting event but certainly has pain in the thumb dorsal aspect, erythema, and a white count of date is 13,000.  PAST MEDICAL HISTORY:  PTSD.  He has a history of left shoulder surgery, knee surgery, cardiac stents, back surgery, tonsillectomy, and polyp removal.  REVIEW OF SYSTEMS:  Significant for a recent rash.  It was felt that he had an inhalation type of event with likely poison ivy and was placed on prednisone.  MEDICINES: 1. Gabapentin 300 mg 3 times a day. 2. Omeprazole 20 mg 2 tablets a day for heartburn. 3. Diclofenac 2 tablets daily for joint pain. 4. Isosorbide mononitrate 60 mg 1 p.o. daily. 5. Slo-Niacin 500 mg 2 tablets daily. 6. Aspirin 81 mg 2 tablets daily for his heart. 7. Lisinopril 5 mg 2 tablets  daily. 8. Simvastatin 40 mg 1 tablet daily. 9. Trazodone 100 mg 1 p.o. at bedtime. 10.Fish oil and vitamins taken daily. 11.He also takes paroxetine hydrochloride 40 mg one-half tablet daily     for anxiety.  I have reviewed this at length.  He recently was started on the prednisone and we discussed this.  ALLERGIES:  VIOXX and BENADRYL.  SOCIAL HISTORY:  He lives with his wife.  He is 63 years of age.  He does not work outside the home at present time.  He does not smoke or drink.  PHYSICAL EXAMINATION:  GENERAL:  Pleasant male, alert and oriented in no acute distress. VITAL SIGNS:  Stable. HEENT:  Within normal limits. CHEST:  Clear. EXTREMITIES:  Lower extremity benign.  He walks with nonantalgic gait. On his right upper extremity, he has cellulitis and erythema about the dorsal aspect of the thumb and wrist with a focal area of abscess over the IP joint of his thumb.  He has poor motion and is quite painful.  He notes no locking or catching. PELVIS:  Stable. ABDOMEN:  Nontender. HEART:  Regular rate.  I have reviewed this at length and his findings.  IMPRESSION:  Abscess right upper extremity.  PLAN:  I have discussed his findings.  I have reviewed his findings from [facility] and the issues with him.  At present time, he has no signs of infection, dystrophy or vascular compromise in the left upper extremity but certainly has ascending cellulitis and infection in the right upper extremity.  I have consented him for I and D.  IMPRESSION:  Abscess right thumb with ascending extensor tenosynovitis probable in nature in my estimation.  PLAN:  He understands the risks and benefits of surgery and desires to proceed with operative intervention.  OPERATIVE PROCEDURE:  The patient was seen by myself, underwent a field block, prepped and draped in usual sterile fashion with Hibiclens, followed by 2 separate Betadine scrubs.  Following this, incision was made over  the IP joint of the thumb.  Abscess was noted.  He was cultured.  Following this, I then was able to retract this to the area proximal to his MCP joint and made a counter incision.  The separate incision was along the tendon sheath, and I performed I and D of the tendon sheath and removal of the de-epithelialized skin tissue as well as pre-necrotic and necrotic fatty tissue.  I irrigated copiously with greater than a liter of saline and placed a Penrose through and through.  Thus, the patient underwent an extensive tenosynovectomy of purulent extensor tenosynovitis, as well as I and D of the deep abscess.  He will be admitted for IV antibiotics, general postoperative observation, etc.  At present time, our EPIC computer system is completely down as it is going to shut down.  Thus, I have dictated this note and will proceed with formal written orders ASAP.  I have discussed with he and his family all issues, these notes etc. and all questions have been encouraged and answered.     Dionne Ano. Amanda Pea, M.D.     [facility]  D:  01/08/2012  T:  01/08/2012  Job:  [id number]

## 2012-01-08 NOTE — Progress Notes (Signed)
PHARMACIST - PHYSICIAN COMMUNICATION DR:   Amanda Pea  CONCERNING:  Pt on Omeprazole at home for presumed reflux, which has not been ordered.  Pt is currently on steroids which may aggravate this condition.   RECOMMENDATION: Please order Protonix 40mg  po daily

## 2012-01-08 NOTE — Progress Notes (Signed)
Utilization review completed. Talita Recht, RN, BSN. 01/08/12  

## 2012-01-08 NOTE — Progress Notes (Signed)
Patient ID: Cory [name], male   DOB: 12-31-1945, 66 y.o.   MRN: [id number] Patient was seen last night and full dictation was performed through [facility].  Patient had a large abscess about his thumb and extensor apparatus which was I&D Penrose drain was placed and there were no complications.  He'll be admitted for IV antibiotics as well as Whirlpool WaterPik/hydrotherapy and we will advance him to wet-to-dry dressing changes the wound matures.  We will await his cultures and continue an aggressive course of care. His consult and procedure note are dictated in the [facility] system as fracture Epic was down  last night.  Dominica Severin MD

## 2012-01-09 LAB — DIFFERENTIAL
Basophils Absolute: 0 10*3/uL (ref 0.0–0.1)
Basophils Relative: 0 % (ref 0–1)
Eosinophils Absolute: 0 10*3/uL (ref 0.0–0.7)
Eosinophils Relative: 0 % (ref 0–5)
Lymphocytes Relative: 5 % — ABNORMAL LOW (ref 12–46)
Lymphs Abs: 0.8 10*3/uL (ref 0.7–4.0)
Monocytes Absolute: 0.7 10*3/uL (ref 0.1–1.0)
Monocytes Relative: 5 % (ref 3–12)
Neutro Abs: 14.8 10*3/uL — ABNORMAL HIGH (ref 1.7–7.7)
Neutrophils Relative %: 91 % — ABNORMAL HIGH (ref 43–77)

## 2012-01-09 LAB — CBC
HCT: 38.3 % — ABNORMAL LOW (ref 39.0–52.0)
Hemoglobin: 13.2 g/dL (ref 13.0–17.0)
MCH: 31.2 pg (ref 26.0–34.0)
MCHC: 34.5 g/dL (ref 30.0–36.0)
MCV: 90.5 fL (ref 78.0–100.0)
Platelets: 190 10*3/uL (ref 150–400)
RBC: 4.23 MIL/uL (ref 4.22–5.81)
RDW: 12.6 % (ref 11.5–15.5)
WBC: 14.9 10*3/uL — ABNORMAL HIGH (ref 4.0–10.5)

## 2012-01-09 MED ORDER — GABAPENTIN 300 MG PO CAPS
300.0000 mg | ORAL_CAPSULE | Freq: Three times a day (TID) | ORAL | Status: DC
  Administered 2012-01-09 – 2012-01-11 (×7): 300 mg via ORAL
  Filled 2012-01-09 (×7): qty 1

## 2012-01-09 MED ORDER — HYDROCODONE-ACETAMINOPHEN 5-325 MG PO TABS
1.0000 | ORAL_TABLET | ORAL | Status: DC | PRN
  Administered 2012-01-09: 2 via ORAL
  Administered 2012-01-09: 1 via ORAL
  Administered 2012-01-10: 2 via ORAL
  Administered 2012-01-10: 1 via ORAL
  Administered 2012-01-11: 2 via ORAL
  Filled 2012-01-09: qty 2
  Filled 2012-01-09 (×2): qty 1
  Filled 2012-01-09 (×2): qty 2

## 2012-01-09 NOTE — Progress Notes (Signed)
Subjective:   patient reports pain in his thumb intermittently. He has improved range of motion to the fingers as well as the thumb and less pain. He is able to tolerate a regular diet. His never get the bed and move around without difficulty. His IV antibiotics are currently being continued. I discussed his care with nursing staff. Patient reports pain as mild.    Objective: Vital signs in last 24 hours: Temp:  [97.6 F (36.4 C)-98.7 F (37.1 C)] 97.6 F (36.4 C) (01/10 1251) Pulse Rate:  [65-74] 74  (01/10 1251) Resp:  [18] 18  (01/10 1251) BP: (112-133)/(55-76) 118/68 mmHg (01/10 1251) SpO2:  [95 %-98 %] 95 % (01/10 1251)  Intake/Output from previous day: 01/09 0701 - 01/10 0700 In: 1990 [P.O.:840; I.V.:850; IV Piggyback:300] Out: -  Intake/Output this shift:     Basename 01/09/12 0520 01/08/12 0940 01/07/12 1859  HGB 13.2 13.1 12.5*    Basename 01/09/12 0520 01/08/12 0940  WBC 14.9* 16.2*  RBC 4.23 4.17*  HCT 38.3* 37.7*  PLT 190 192    Basename 01/08/12 0231  NA 137  K 4.1  CL 102  CO2 24  BUN 17  CREATININE 0.75  GLUCOSE 151*  CALCIUM 9.4   No results found for this basename: LABPT:2,INR:2 in the last 72 hours  Neurologically intact ABD soft Sensation intact distally Compartment soft  Physical exam reveals a pleasant male alert and oriented he is not have his dressing changed since Tuesday. Due to the fracture system had shut down his physical therapy orders were not entered despite being written in his chart according to the shutdown protocol I've discussed this with nursing staff and the patient at length.  HEENT was within normal limits today. Abdomen is nontender. Chest is clear. Lower extremity examination shows no signs of DVT infection or vascular compromise.  Patient has his right thumb examined I performed a irrigation and debridement at bedside. He has lesser edema much improved range of motion and parameters are improving.  His I&D was  performed without difficulty at bedside.  Procedure note: Patient underwent a prep and drape followed by removal of his Penrose. He had reaccumulation of infectious fluid. I performed irrigation and debridement of skin subcutaneous tissue and his flexor tendon sheath. He tolerated this well I placed her in a liter of saline through the area and following this packed it with gauze. This was a wet-to-dry dressing change. Irrigation and debridement was performed with scissors did not blade and curette this was an excisional debridement.  Following debridement and I&D he was dressed sterilely.    Assessment/Plan:  Infection right thumb soft tissue and extensor tendon apparatus. Patient has improving parameters. His cultures are growing staph and we will continue IV antibiotics for this. I discussed all issues with the patient at length. He tolerated his irrigation and debridement well at bedside.  I discussed and that will wake his cultures so that we can find 10 antibiotics by mouth. I will review the culture results daily. I discussed with nursing staff and necessity of whirlpool reinstitute our plans for whirlpool tomorrow as I performed this I&D at bedside.     Continued IV antibiotics. We will check a CBC in the a.m. as his white count has been trending down but is still high. The patient understands the plan of care and the necessity of inpatient admission due to the severity of his infection.  I discussed all issues with the patient as well as nursing staff to  make sure Boren is on board with his treatment care plan.  Paulene Floor 01/09/2012, 4:19 PM

## 2012-01-10 LAB — CBC
HCT: 36.5 % — ABNORMAL LOW (ref 39.0–52.0)
Hemoglobin: 12.6 g/dL — ABNORMAL LOW (ref 13.0–17.0)
MCH: 31.1 pg (ref 26.0–34.0)
MCHC: 34.5 g/dL (ref 30.0–36.0)
MCV: 90.1 fL (ref 78.0–100.0)
Platelets: 196 10*3/uL (ref 150–400)
RBC: 4.05 MIL/uL — ABNORMAL LOW (ref 4.22–5.81)
RDW: 12.4 % (ref 11.5–15.5)
WBC: 12.8 10*3/uL — ABNORMAL HIGH (ref 4.0–10.5)

## 2012-01-10 LAB — VANCOMYCIN, TROUGH: Vancomycin Tr: 9.8 ug/mL — ABNORMAL LOW (ref 10.0–20.0)

## 2012-01-10 MED ORDER — VANCOMYCIN HCL 1000 MG IV SOLR
1250.0000 mg | Freq: Two times a day (BID) | INTRAVENOUS | Status: DC
  Administered 2012-01-11: 1250 mg via INTRAVENOUS
  Filled 2012-01-10 (×3): qty 1250

## 2012-01-10 NOTE — Progress Notes (Signed)
ANTIBIOTIC CONSULT NOTE - FOLLOW UP  Pharmacy Consult for Vancomycin Indication: Rt. Thumb Abscess and Cellulitis (MRSA)   Allergies  Allergen Reactions  . Benadryl (Altaryl)   . Benadryl (Banophen)   . Vioxx (Rofecoxib) Other (See Comments)    headache    Patient Measurements: Height: 5' 9.5" (176.5 cm) Weight: 170 lb (77.111 kg) IBW/kg (Calculated) : 71.85    Vital Signs: Temp: 98.3 F (36.8 C) (01/11 1300) BP: 131/80 mmHg (01/11 1300) Pulse Rate: 62  (01/11 1300) Intake/Output from previous day: 01/10 0701 - 01/11 0700 In: 2164.5 [P.O.:240; I.V.:1674.5; IV Piggyback:250] Out: -  Intake/Output from this shift:    Labs:  Basename 01/10/12 0522 01/09/12 0520 01/08/12 0940 01/08/12 0231  WBC 12.8* 14.9* 16.2* --  HGB 12.6* 13.2 13.1 --  PLT 196 190 192 --  LABCREA -- -- -- --  CREATININE -- -- -- 0.75   Estimated Creatinine Clearance: 94.9 ml/min (by C-G formula based on Cr of 0.75).  Basename 01/10/12 1848  VANCOTROUGH 9.8*  VANCOPEAK --  Jake Michaelis --  GENTTROUGH --  GENTPEAK --  GENTRANDOM --  TOBRATROUGH --  Shelah Lewandowsky --  TOBRARND --  AMIKACINPEAK --  AMIKACINTROU --  AMIKACIN --     Microbiology: Recent Results (from the past 720 hour(s))  WOUND CULTURE     Status: Normal   Collection Time   01/08/12 12:25 AM      Component Value Range Status Comment   Specimen Description WOUND RIGHT THUMB   Final    Special Requests NONE   Final    Gram Stain     Final    Value: FEW WBC PRESENT,BOTH PMN AND MONONUCLEAR     NO SQUAMOUS EPITHELIAL CELLS SEEN     NO ORGANISMS SEEN   Culture     Final    Value: ABUNDANT METHICILLIN RESISTANT STAPHYLOCOCCUS AUREUS     Note: RIFAMPIN AND GENTAMICIN SHOULD NOT BE USED AS SINGLE DRUGS FOR TREATMENT OF STAPH INFECTIONS. This organism DOES NOT demonstrate inducible Clindamycin resistance in vitro. CRITICAL RESULT CALLED TO, READ BACK BY AND VERIFIED WITH: TAMMY PENNINGTON      01/10/12 AT 1010 AM BY [facility]   Report  Status 01/10/2012 FINAL   Final    Organism ID, Bacteria METHICILLIN RESISTANT STAPHYLOCOCCUS AUREUS   Final     Anti-infectives     Start     Dose/Rate Route Frequency Ordered Stop   01/08/12 0800   vancomycin (VANCOCIN) IVPB 1000 mg/200 mL premix        1,000 mg 200 mL/hr over 60 Minutes Intravenous Every 12 hours 01/08/12 0334     01/08/12 0800   piperacillin-tazobactam (ZOSYN) IVPB 3.375 g  Status:  Discontinued        3.375 g 12.5 mL/hr over 240 Minutes Intravenous Every 8 hours 01/08/12 0334 01/10/12 1600   01/08/12 0230  piperacillin-tazobactam (ZOSYN) IVPB 3.375 g       3.375 g 100 mL/hr over 30 Minutes Intravenous  Once 01/08/12 0219 01/08/12 [id number]          Assessment: Vancomycin trough = 9.8 mcg/ml in this 66 y.o. male admitted on 01/07/2012, with R thumb abscess and cellulitis. Day # on IV Vancomycin 1gm IV q12h. Cultures are growing out MRSA bacteria.  WBC 12.8K. Temp 98.3 F.  Goal of Therapy:  Vancomycin trough level 10-15 mcg/ml  Plan:  Increase Vancomycin dose to '1250mg'$  IV q12 hours to target Vancomycin trough = 10-15 mcg/ml.    Arman Bogus  01/10/2012,8:39 PM

## 2012-01-10 NOTE — Progress Notes (Signed)
Subjective: Patient reports minimal to no pain  He is moving his fingers are better. He notes less redness. He is tolerating his diet. He has no worsening complaints. Overall he feels much better where quite pleased it is. He had his first whirlpool today which went uneventfully.   Objective: Vital signs in last 24 hours: Temp:  [97.8 F (36.6 C)-98.3 F (36.8 C)] 98.3 F (36.8 C) (01/11 1300) Pulse Rate:  [60-68] 62  (01/11 1300) Resp:  [18] 18  (01/11 1300) BP: (131-135)/(74-86) 131/80 mmHg (01/11 1300) SpO2:  [95 %-100 %] 100 % (01/11 1300)  Intake/Output from previous day: 01/10 0701 - 01/11 0700 In: 2164.5 [P.O.:240; I.V.:1674.5; IV Piggyback:250] Out: -  Intake/Output this shift: Total I/O In: 600 [P.O.:600] Out: -    Basename 01/10/12 0522 01/09/12 0520 01/08/12 0940 01/07/12 1859  HGB 12.6* 13.2 13.1 12.5*    Basename 01/10/12 0522 01/09/12 0520  WBC 12.8* 14.9*  RBC 4.05* 4.23  HCT 36.5* 38.3*  PLT 196 190    Basename 01/08/12 0231  NA 137  K 4.1  CL 102  CO2 24  BUN 17  CREATININE 0.75  GLUCOSE 151*  CALCIUM 9.4   No results found for this basename: LABPT:2,INR:2 in the last 72 hours  Neurologically intact ABD soft Neurovascular intact Sensation intact distally Intact pulses distally Compartment soft He looks much better range of motion is excellent urine edema is resolving nicely no evidence of compartment syndrome or necrotizing fasciitis. His wounds are clean dry and wicked appropriately.   No evidence of instability about the hand and no evidence of septic joint findings at present time. Assessment/Plan: We are going to plan for whirlpool tomorrow followed by education for wet-to-dry dressing change and followup as an outpatient in my office with conversion to by mouth antibiotics.   Patient understands all issues. He understands his cultures are growing out MRSA bacteria and understands that he'll need at least 2 weeks of by mouth  antibiotics as well as close observatory care in my office and dressing changes.  A rocket his progress is very well he has made excellent gains and has no comp caring features today. This was an aggressive infection involving the deep space this right hand and tendon sheath.   Karen Chafe 01/10/2012, 3:35 PM

## 2012-01-10 NOTE — Progress Notes (Signed)
Physical Therapy Wound Treatment Patient Details  Name: Cory [name] MRN: [id number] Date of Birth: 1946/06/11  Today's Date: 01/10/2012 Time: [id number] Time Calculation (min): 30 min  Subjective  Subjective: Pt states the infection progressed rapidly. Patient and Family Stated Goals: To get hand better Date of Onset: 01/04/12 Prior Treatments: I & D  Pain Score: Pain Score:   3  Wound Assessment  Incision Hand Right (Active)  Site / Wound Assessment Pink 01/10/2012 10:04 AM  Incision Length (cm) distal 0.5 cm 01/10/2012 10:04 AM  Incision Length (cm) proximal 0.7 cm 01/10/2012   Incision Width (cm) distal 0.5 cm 01/10/2012  Incision Width (cm) proximal 0.3 cm 01/10/2012  Drainage Amount Moderate 01/10/2012 10:04 AM  Drainage Description Serous 01/10/2012 10:04 AM  Treatment Other (Comment) 01/10/2012 10:04 AM  Dressing Type Compression wrap;Gauze (Comment);Moist to dry 01/10/2012 10:04 AM       Wound Assessment and Plan  Wound Therapy - Assess/Plan/Recommendations Wound Therapy - Clinical Statement: Pt with adm with thumb abscess and underwent I&D.  Presents to hydrotherapy with two small open incisions.  Can benefit from hydrotherapy to cleanse wounds and promote healing.  Began instructing pt on dressing changes at home.  Wife will be able to perform dressing changes at home. Wound Therapy - Functional Problem List: Decr use of rt. hand due to edema and pain. Factors Delaying/Impairing Wound Healing: Infection - systemic/local Hydrotherapy Plan: Debridement;Dressing change;Patient/family education;Pulsatile lavage with suction Wound Therapy - Frequency: 6X / week Wound Therapy - Follow Up Recommendations: Other (comment) (MD office)  Wound Therapy Goals- Improve the function of patient's integumentary system by progressing the wound(s) through the phases of wound healing (inflammation - proliferation - remodeling) by: Improve Drainage Characteristics: Min Improve Drainage  Characteristics - Progress: Not met Patient/Family will be able to : verbalize understanding of dressing change. Patient/Family Instruction Goal - Progress: Progressing toward goal  Goals will be updated until maximal potential achieved or discharge criteria met.  Discharge criteria: when goals achieved, discharge from hospital, MD decision/surgical intervention, no progress towards goals, refusal/missing three consecutive treatments without notification or medical reason.  Cory [name] 01/10/2012, 10:12 AM  Suanne Marker PT [phone]

## 2012-01-10 NOTE — Progress Notes (Signed)
PHARMACIST - PHYSICIAN COMMUNICATION DR:   Amanda Pea  CONCERNING:  66yo male s/p drainage of thumb abscess.  Pt with history of GERD & on Prilosec at home.  He is also on a Prednisone taper, which may exacerbate his GERD.  There is no VTE prophylaxis ordered.   RECOMMENDATION: 1.  Continue Protonix (formulary sub. For Prilosec) 40mg  bid  2.  Consider adding SCDs while pt in bed

## 2012-01-11 MED ORDER — HYDROCODONE-ACETAMINOPHEN 5-325 MG PO TABS: 2.0000 | ORAL_TABLET | Freq: Four times a day (QID) | ORAL | Status: AC | PRN

## 2012-01-11 MED ORDER — CLINDAMYCIN HCL 300 MG PO CAPS: 300.0000 mg | ORAL_CAPSULE | Freq: Four times a day (QID) | ORAL | Status: AC

## 2012-01-11 NOTE — Progress Notes (Signed)
Physical Therapy Wound Treatment Patient Details  Name: Cory [name] MRN: [id number] Date of Birth: [birth year]  Today's Date: 01/11/2012 Time:  -     Subjective  Subjective: Reports he had his pain medicine about 30 minutes ago.  Pain Score: Pain Score:   4  Wound Assessment  Incision Hand Right (Active)  Site / Wound Assessment Pink 01/11/2012 10:55 AM  Incision Length (cm) 0.5 cm 01/10/2012 10:04 AM  Drainage Amount Moderate 01/11/2012 10:55 AM  Drainage Description Purulent;No odor;Serosanguineous 01/11/2012 10:55 AM  Treatment Cleansed;Other (Comment) Hydrotherapy pulsed lavage performed to left hand wounds at 4 psi x 1000 ml NS 01/11/2012 10:55 AM  Dressing Type Compression wrap;Gauze (Comment);Moist to dry;Other (Commet). Moist packing strips to wound sites, covered by dry 4x4, with hand wrapped in Kerlix and ace wrap. 01/11/2012 10:55 AM  Dressing Changed 01/11/2012 10:55 AM       Wound Assessment and Plan   Wound progressing towards goals.   Wound Therapy Goals- Improve the function of patient's integumentary system by progressing the wound(s) through the phases of wound healing (inflammation - proliferation - remodeling) by: Improve Drainage Characteristics - Progress: Progressing toward goal Patient/Family Instruction Goal - Progress: Progressing toward goal  Goals will be updated until maximal potential achieved or discharge criteria met.  Discharge criteria: when goals achieved, discharge from hospital, MD decision/surgical intervention, no progress towards goals, refusal/missing three consecutive treatments without notification or medical reason.  Sallyanne Kuster 01/11/2012, 1:43 PM

## 2012-01-11 NOTE — Discharge Summary (Signed)
Physician Discharge Summary  Patient ID: Cory [name] MRN: [id number] DOB/AGE: 05/29/1946 66 y.o.  Admit date: 01/07/2012 Discharge date: 01/11/2012  Admission Diagnoses: Infection right thumb and hand with tendon sheath involvement and deep abscess  Discharge Diagnoses: Same Active Problems:  * No active hospital problems. *    Discharged Condition: good  Hospital Course: Patient was admitted after irrigation and debridement late Tuesday night. He was admitted secondary to severe infection with ascending lymphangitis, cellulitis and pain. Cultures grew out MRSA 8 bacteria which is sensitive to clindamycin. He ultimately had whirlpool and dressing change as well as daily irrigation and debridement onset and has made excellent improvement. He is stable for discharge and ready for home dressing care as instructed by myself. I've instructed his wife in regards to the upper extremity predicament and his dressing care. He'll be discharged home on clindamycin, wound care, and followup in my office in 36 hours. At time of discharge he looks excellent and is ready for home. He is quite pleased with his resolution.  Consults: none  Significant Diagnostic Studies: microbiology: wound culture: positive for MRSA  Treatments: antibiotics: vancomycin  Discharge Exam: Blood pressure 121/67, pulse 54, temperature 98.3 F (36.8 C), temperature source Oral, resp. rate 18, height 5' 9.5" (1.765 [name]), weight 77.111 kg (170 lb), SpO2 97.00%. Extremities: extremities normal, atraumatic, no cyanosis or edema.Marland KitchenMarland KitchenThe patient is alert and oriented in no acute distress the patient complains of pain in the affected upper extremity. The patient is noted to have a normal HEENT exam. Lung fields show equal chest expansion and no shortness of breath abdomen exam is nontender without distention. Lower extremity examination does not show any fracture dislocation or blood clot symptoms. Pelvis is stable neck and back are  stable and nontender  Wound is much improved cellulitis has decreased markedly and wound looks quite well. The right upper extremity is stable at this juncture. No signs of compartment syndrome dystrophy or worsening infection. Disposition: Home or Self Care   Medication List  As of 01/11/2012  1:14 PM   STOP taking these medications         ciprofloxacin 500 MG tablet         TAKE these medications         aspirin EC 81 MG tablet   Take 162 mg by mouth at bedtime.      clindamycin 300 MG capsule   Commonly known as: CLEOCIN   Take 1 capsule (300 mg total) by mouth 4 (four) times daily.      diclofenac 75 MG EC tablet   Commonly known as: VOLTAREN   Take 75 mg by mouth 2 (two) times daily.      fish oil-omega-3 fatty acids 1000 MG capsule   Take 2 g by mouth daily.      gabapentin 100 MG capsule   Commonly known as: NEURONTIN   Take 100 mg by mouth 3 (three) times daily.      HYDROcodone-acetaminophen 5-325 MG per tablet   Commonly known as: NORCO   Take 1 tablet by mouth Every 4 hours as needed. For pain      HYDROcodone-acetaminophen 5-325 MG per tablet   Commonly known as: NORCO   Take 2 tablets by mouth every 6 (six) hours as needed for pain.      isosorbide mononitrate 60 MG 24 hr tablet   Commonly known as: IMDUR   Take 60 mg by mouth daily.      lisinopril 5 MG  tablet   Commonly known as: PRINIVIL,ZESTRIL   Take 10 mg by mouth daily.      multivitamins ther. w/minerals Tabs   Take 1 tablet by mouth daily.      niacin 500 MG tablet   Commonly known as: SLO-NIACIN   Take 1,000 mg by mouth at bedtime.      omeprazole 20 MG capsule   Commonly known as: PRILOSEC   Take 20 mg by mouth 2 (two) times daily.      PARoxetine 40 MG tablet   Commonly known as: PAXIL   Take 40 mg by mouth every morning.      polysaccharide iron 150 MG Caps capsule   Commonly known as: NIFEREX   Take 150 mg by mouth daily.      predniSONE 10 MG tablet   Commonly known as:  DELTASONE   Take 10 mg by mouth daily. Take four tablets daily for 4 days, then take two tablets daily for 2 days, then take one tablet daily for 2 days, then take one-half tablet daily for 2 days, then stop      simvastatin 40 MG tablet   Commonly known as: ZOCOR   Take 40 mg by mouth at bedtime.      traZODone 100 MG tablet   Commonly known as: DESYREL   Take 100 mg by mouth at bedtime.           Follow-up Information    Follow up with Cory [name],Cory [name] in 2 days. (come to the offfice at 11 am Monday for follow up)    Contact information:   [address] [place] [address] [phone]          Signed: Karen [name] 01/11/2012, 1:14 PM

## 2012-01-11 NOTE — Discharge Instructions (Signed)
Keep bandage clean and dry.  Call for any problems.  No smoking.  Criteria for driving a car: you should be off your pain medicine for 7-8 hours, able to drive one handed(confident), thinking clearly and feeling able in your judgement to drive. Continue elevation as it will decrease swelling.  If instructed by MD move your fingers within the confines of the bandage/splint.  Use ice if instructed by your MD. Call immediately for any sudden loss of feeling in your hand/arm or change in functional abilities of the extremity. We recommend that you to take vitamin C 1000 mg a day to promote healing we also recommend that if you require her pain medicine that he take a stool softener to prevent constipation as most pain medicines will have constipation side effects. We recommend either Peri-Colace or Senokot and recommend that you also consider adding MiraLAX to prevent the constipation affects from pain medicine if you are required to use them. These medicines are over the counter and maybe purchased at a local pharmacy.  Continue your regular home medicine  Discontinued the Cipro that was prescribed at the [facility] hospital as Dr. Amedeo Plenty and has prescribed a new antibiotic which will be better suited for the infection.

## 2012-01-13 ENCOUNTER — Other Ambulatory Visit: Payer: Self-pay | Admitting: Orthopedic Surgery

## 2012-01-14 NOTE — Progress Notes (Signed)
Utilization review completed. Lamarcus Spira, RN, BSN. 01/14/12 

## 2012-01-15 MED FILL — Morphine Sulfate Inj 4 MG/ML: INTRAMUSCULAR | Qty: 1 | Status: AC

## 2012-02-20 NOTE — H&P (Signed)
  NTS SOAP Note  Vital Signs:  Vitals as of: 02/20/2012: Systolic 115: Diastolic 82: Heart Rate 87: Temp 98.19F: Height 3ft 9in: Weight 183Lbs 0 Ounces: OFC 0in: Respiratory Rate 0: O2 Saturation 0: Pain Level 0: BMI 27  BMI : 27.02 kg/m2  Subjective: This 19 Years 81 Months old Male presents forworkup of anemia.  Has iron deficiency anemia of unknown etiology.  Needs TCS.  Also has h/o heartburn and PUD.  Some epigastric pain noted.  Denies melana, hematochezia, nausea, vomiting, family h/o colon carcinoma.  Review of Symptoms:  Constitutional:unremarkable Head:unremarkable Eyes:unremarkable Nose/Mouth/Throat:unremarkable Cardiovascular:unremarkable Respiratory:unremarkable see above Genitourinary:unremarkable Musculoskeletal:unremarkable Skin:unremarkable Hematolgic/Lymphatic:unremarkable Allergic/Immunologic:unremarkable   Past Medical History:Reviewed   Past Medical History  Surgical History: unremarkable Medical Problems:  Anemia, High Blood pressure, High cholesterol Allergies: nkda Medications: trazadone, simvastatin, niaspan, baby ASA, cnetrum, diclofenac, citalopram, lisinopril, omeprazole   Social History:Reviewed   Social History  Preferred Language: English ([place]) Race:  White Ethnicity: Not Hispanic / Latino Age: 66 Years 3 Months Marital Status:  M Alcohol:  No Recreational drug(s):  No   Smoking Status: Never smoker reviewed on 02/20/2012  Family History:Reviewed   Family History  Is there a family history of:No family h/o colon cancer    Objective Information: General:Well appearing, well nourished in no distress. Skin:no rash or prominent lesions Head:Atraumatic; no masses; no abnormalities Neck:Supple without lymphadenopathy.  Heart:RRR, no murmur or gallop.  Normal S1, S2.  No S3, S4.  Lungs:CTA bilaterally, no wheezes, rhonchi, rales.  Breathing  unlabored. Abdomen:Soft, NT/ND, normal bowel sounds, no HSM, no masses.  No peritoneal signs. deferred to procedure  Assessment:Anemia, heartburn  Diagnosis &amp; Procedure: DiagnosisCode: 280.0, ProcedureCode: [address],    Plan:Schedule for EGD/TCS on 02/25/12.   Patient Education:Alternative treatments to surgery were discussed with patient (and family).Risks and benefits  of procedure were fully explained to the patient (and family) who gave informed consent. Patient/family questions were addressed.  Follow-up:Pending Surgery

## 2012-02-24 ENCOUNTER — Encounter (HOSPITAL_COMMUNITY): Payer: Self-pay | Admitting: Pharmacy Technician

## 2012-02-24 MED ORDER — SODIUM CHLORIDE 0.45 % IV SOLN
Freq: Once | INTRAVENOUS | Status: AC
  Administered 2012-02-25: 08:00:00 via INTRAVENOUS

## 2012-02-25 ENCOUNTER — Encounter (HOSPITAL_COMMUNITY): Payer: Self-pay | Admitting: *Deleted

## 2012-02-25 ENCOUNTER — Ambulatory Visit (HOSPITAL_COMMUNITY)
Admission: RE | Admit: 2012-02-25 | Discharge: 2012-02-25 | Disposition: A | Discharge status: Home / Self Care | Payer: PRIVATE HEALTH INSURANCE | Source: Ambulatory Visit | Attending: General Surgery | Admitting: General Surgery

## 2012-02-25 ENCOUNTER — Encounter (HOSPITAL_COMMUNITY)
Admission: RE | Disposition: A | Discharge status: Home / Self Care | Payer: Self-pay | Source: Ambulatory Visit | Attending: General Surgery

## 2012-02-25 DIAGNOSIS — R1013 Epigastric pain: Secondary | ICD-10-CM | POA: Insufficient documentation

## 2012-02-25 DIAGNOSIS — D509 Iron deficiency anemia, unspecified: Secondary | ICD-10-CM | POA: Insufficient documentation

## 2012-02-25 DIAGNOSIS — I10 Essential (primary) hypertension: Secondary | ICD-10-CM | POA: Insufficient documentation

## 2012-02-25 DIAGNOSIS — Z79899 Other long term (current) drug therapy: Secondary | ICD-10-CM | POA: Insufficient documentation

## 2012-02-25 DIAGNOSIS — E7800 Pure hypercholesterolemia, unspecified: Secondary | ICD-10-CM | POA: Insufficient documentation

## 2012-02-25 DIAGNOSIS — A048 Other specified bacterial intestinal infections: Secondary | ICD-10-CM | POA: Insufficient documentation

## 2012-02-25 DIAGNOSIS — K5730 Diverticulosis of large intestine without perforation or abscess without bleeding: Secondary | ICD-10-CM | POA: Insufficient documentation

## 2012-02-25 HISTORY — PX: COLONOSCOPY: SHX5424

## 2012-02-25 HISTORY — PX: ESOPHAGOGASTRODUODENOSCOPY: SHX5428

## 2012-02-25 SURGERY — COLONOSCOPY
Anesthesia: Moderate Sedation

## 2012-02-25 MED ORDER — MEPERIDINE HCL 100 MG/ML IJ SOLN
INTRAMUSCULAR | Status: AC
  Filled 2012-02-25: qty 2

## 2012-02-25 MED ORDER — MIDAZOLAM HCL 5 MG/5ML IJ SOLN
INTRAMUSCULAR | Status: AC
  Filled 2012-02-25: qty 10

## 2012-02-25 MED ORDER — MIDAZOLAM HCL 5 MG/5ML IJ SOLN
INTRAMUSCULAR | Status: DC | PRN
  Administered 2012-02-25: 4 mg via INTRAVENOUS

## 2012-02-25 MED ORDER — MEPERIDINE HCL 25 MG/ML IJ SOLN
INTRAMUSCULAR | Status: DC | PRN
  Administered 2012-02-25: 50 mg via INTRAVENOUS

## 2012-02-25 NOTE — Discharge Instructions (Signed)
Esophagogastroduodenoscopy This is an endoscopic procedure (a procedure that uses a device like a flexible telescope) that allows your caregiver to view the upper stomach and small bowel. This test allows your caregiver to look at the esophagus. The esophagus carries food from your mouth to your stomach. They can also look at your duodenum. This is the first part of the small intestine that attaches to the stomach. This test is used to detect problems in the bowel such as ulcers and inflammation. PREPARATION FOR TEST Nothing to eat after midnight the day before the test. NORMAL FINDINGS Normal esophagus, stomach, and duodenum. Ranges for normal findings may vary among different laboratories and hospitals. You should always check with your doctor after having lab work or other tests done to discuss the meaning of your test results and whether your values are considered within normal limits. MEANING OF TEST  Your caregiver will go over the test results with you and discuss the importance and meaning of your results, as well as treatment options and the need for additional tests if necessary. OBTAINING THE TEST RESULTS It is your responsibility to obtain your test results. Ask the lab or department performing the test when and how you will get your results. Document Released: 04/18/2005 Document Revised: 08/28/2011 Document Reviewed: 11/25/2008 [facility] Patient Information 2012 [place], [place].Colonoscopy Care After Read the instructions outlined below and refer to this sheet in the next few weeks. These discharge instructions provide you with general information on caring for yourself after you leave the hospital. Your doctor may also give you specific instructions. While your treatment has been planned according to the most current medical practices available, unavoidable complications occasionally occur. If you have any problems or questions after discharge, call your doctor. HOME CARE  INSTRUCTIONS ACTIVITY:  You may resume your regular activity, but move at a slower pace for the next 24 hours.   Take frequent rest periods for the next 24 hours.   Walking will help get rid of the air and reduce the bloated feeling in your belly (abdomen).   No driving for 24 hours (because of the medicine (anesthesia) used during the test).   You may shower.   Do not sign any important legal documents or operate any machinery for 24 hours (because of the anesthesia used during the test).  NUTRITION:  Drink plenty of fluids.   You may resume your normal diet as instructed by your doctor.   Begin with a light meal and progress to your normal diet. Heavy or fried foods are harder to digest and may make you feel sick to your stomach (nauseated).   Avoid alcoholic beverages for 24 hours or as instructed.  MEDICATIONS:  You may resume your normal medications unless your doctor tells you otherwise.  WHAT TO EXPECT TODAY:  Some feelings of bloating in the abdomen.   Passage of more gas than usual.   Spotting of blood in your stool or on the toilet paper.  IF YOU HAD POLYPS REMOVED DURING THE COLONOSCOPY:  No aspirin products for 7 days or as instructed.   No alcohol for 7 days or as instructed.   Eat a soft diet for the next 24 hours.  FINDING OUT THE RESULTS OF YOUR TEST Not all test results are available during your visit. If your test results are not back during the visit, make an appointment with your caregiver to find out the results. Do not assume everything is normal if you have not heard from your caregiver  or the medical facility. It is important for you to follow up on all of your test results.  SEEK IMMEDIATE MEDICAL CARE IF:  You have more than a spotting of blood in your stool.   Your belly is swollen (abdominal distention).   You are nauseated or vomiting.   You have a fever.   You have abdominal pain or discomfort that is severe or gets worse throughout  the day.  Document Released: 07/30/2004 Document Revised: 08/28/2011 Document Reviewed: 07/28/2008 [facility] Patient Information 2012 [place], [place].Diverticulosis Diverticulosis is a common condition that develops when small pouches (diverticula) form in the wall of the colon. The risk of diverticulosis increases with age. It happens more often in people who eat a low-fiber diet. Most individuals with diverticulosis have no symptoms. Those individuals with symptoms usually experience abdominal pain, constipation, or loose stools (diarrhea). HOME CARE INSTRUCTIONS   Increase the amount of fiber in your diet as directed by your caregiver or dietician. This may reduce symptoms of diverticulosis.   Your caregiver may recommend taking a dietary fiber supplement.   Drink at least 6 to 8 glasses of water each day to prevent constipation.   Try not to strain when you have a bowel movement.   Your caregiver may recommend avoiding nuts and seeds to prevent complications, although this is still an uncertain benefit.   Only take over-the-counter or prescription medicines for pain, discomfort, or fever as directed by your caregiver.  FOODS WITH HIGH FIBER CONTENT INCLUDE:  Fruits. Apple, peach, pear, tangerine, raisins, prunes.   Vegetables. Brussels sprouts, asparagus, broccoli, cabbage, carrot, cauliflower, romaine lettuce, spinach, summer squash, tomato, winter squash, zucchini.   Starchy Vegetables. Baked beans, kidney beans, lima beans, split peas, lentils, potatoes (with skin).   Grains. Whole wheat bread, brown rice, bran flake cereal, plain oatmeal, white rice, shredded wheat, bran muffins.  SEEK IMMEDIATE MEDICAL CARE IF:   You develop increasing pain or severe bloating.   You have an oral temperature above 102 F (38.9 C), not controlled by medicine.   You develop vomiting or bowel movements that are bloody or black.  Document Released: 09/12/2004 Document Revised: 08/28/2011 Document  Reviewed: 05/16/2010 [facility] Patient Information 2012 [place], [place].

## 2012-02-25 NOTE — Op Note (Signed)
[facility] [address] [place], [place]  [address]  ENDOSCOPY PROCEDURE REPORT  PATIENT:  Cory [name], Cory [name]  MR#:  [id number] BIRTHDATE:  30-Jul-1946, 65 yrs. old  GENDER:  male  ENDOSCOPIST:  Franky Macho, MD Referred by:  Assunta Found, M.D.  PROCEDURE DATE:  02/25/2012 PROCEDURE:  EGD with biopsy for H. pylori 19147 ASA CLASS:  Class II INDICATIONS:  MEDICATIONS:   Versed 4 mg IV, demerol 50 mg IV TOPICAL ANESTHETIC:  Cetacaine Spray  DESCRIPTION OF PROCEDURE:   After the risks benefits and alternatives of the procedure were thoroughly explained, informed consent was obtained.  The EG-[id number] ([id number]) endoscope was introduced through the mouth and advanced to the second portion of the duodenum, without limitations.  The instrument was slowly withdrawn as the mucosa was fully examined.  The upper, middle, and distal third of the esophagus were carefully inspected and no abnormalities were noted. The z-line was well seen at the GEJ. The endoscope was pushed into the fundus which was normal including a retroflexed view. The antrum,gastric body, first and second part of the duodenum were unremarkable. Retroflexed views revealed no abnormalities.    The scope was then withdrawn from the patient and the procedure completed.  COMPLICATIONS:  None  ENDOSCOPIC IMPRESSION: 1) Normal EGD RECOMMENDATIONS:  REPEAT EXAM:  No  ______________________________ Franky Macho, MD  CC:  Assunta Found, MD  n. Rosalie DoctorFranky Macho at 02/25/2012 09:12 AM  Chase Caller, [phone]

## 2012-02-25 NOTE — Op Note (Signed)
[facility] [address] [place], [place]  [address]  COLONOSCOPY PROCEDURE REPORT  PATIENT:  Cory [name], Cory [name]  MR#:  [id number] BIRTHDATE:  1946/05/20, 65 yrs. old  GENDER:  male ENDOSCOPIST:  Franky Macho, MD REF. BY:  Assunta Found, M.D. PROCEDURE DATE:  02/25/2012 PROCEDURE:  Diagnostic Colonoscopy ASA CLASS:  Class II INDICATIONS:  Iron deficiency anemia MEDICATIONS:   Versed 4 mg IV, demerol 50 mg IV  DESCRIPTION OF PROCEDURE:   After the risks benefits and alternatives of the procedure were thoroughly explained, informed consent was obtained.  Digital rectal exam was performed and revealed no abnormalities.   The [id number] ([id number]) and EG-2990i ([id number]) endoscope was introduced through the anus and advanced to the cecum, which was identified by both the appendix and ileocecal valve, without limitations.  The quality of the prep was adequate..  The instrument was then slowly withdrawn as the colon was fully examined.  FINDINGS:  Severe diverticulosis was found throughout colon. Retroflexed views in the rectum revealed no abnormalities.  The scope was then withdrawn from the cecum and the procedure completed. COMPLICATIONS:  None ENDOSCOPIC IMPRESSION: 1) Severe diverticulosis 2) Normal colonoscopy otherwise RECOMMENDATIONS:  REPEAT EXAM:  In 10 year(s) for Colonoscopy.  ______________________________ Franky Macho, MD  CC:  Assunta Found, MD  n. Rosalie DoctorFranky Macho at 02/25/2012 09:06 AM  Chase Caller, [phone]

## 2012-02-25 NOTE — Interval H&P Note (Signed)
History and Physical Interval Note:  02/25/2012 8:37 AM  Cory [name]  has presented today for surgery, with the diagnosis of Anemia  The various methods of treatment have been discussed with the patient and family. After consideration of risks, benefits and other options for treatment, the patient has consented to  Procedure(s) (LRB): COLONOSCOPY (N/A) ESOPHAGOGASTRODUODENOSCOPY (EGD) (N/A) as a surgical intervention .  The patients' history has been reviewed, patient examined, no change in status, stable for surgery.  I have reviewed the patients' chart and labs.  Questions were answered to the patient's satisfaction.     Franky Macho A

## 2012-02-26 LAB — CLOTEST (H. PYLORI), BIOPSY: Helicobacter screen: NEGATIVE

## 2012-03-03 ENCOUNTER — Encounter (HOSPITAL_COMMUNITY): Payer: Self-pay | Admitting: General Surgery

## 2013-06-15 ENCOUNTER — Encounter: Payer: Self-pay | Admitting: Orthopedic Surgery

## 2013-06-15 ENCOUNTER — Ambulatory Visit (INDEPENDENT_AMBULATORY_CARE_PROVIDER_SITE_OTHER): Payer: Medicare Other | Admitting: Orthopedic Surgery

## 2013-06-15 ENCOUNTER — Ambulatory Visit (INDEPENDENT_AMBULATORY_CARE_PROVIDER_SITE_OTHER): Payer: Medicare Other

## 2013-06-15 VITALS — BP 110/76 | Ht 69.0 in | Wt 170.0 lb

## 2013-06-15 DIAGNOSIS — M25529 Pain in unspecified elbow: Secondary | ICD-10-CM

## 2013-06-15 DIAGNOSIS — M7710 Lateral epicondylitis, unspecified elbow: Secondary | ICD-10-CM

## 2013-06-15 DIAGNOSIS — M25522 Pain in left elbow: Secondary | ICD-10-CM

## 2013-06-15 NOTE — Progress Notes (Signed)
Patient ID: Cory [name], male   DOB: 05-Feb-1946, 67 y.o.   MRN: [id number] Chief Complaint  Patient presents with  . Elbow Pain    Left elbow pain, no injury.    2 weeks of gradual onset of left elbow pain in this 68 year old active left-hand-dominant male who is been building a play house for his grandchildren. He complains of pain over the lateral epicondyle which is sharp constant slightly improved with a muscle cream and associated with swelling and difficulties with power grip  This is a new problem  Past Medical History  Diagnosis Date  . Hypertension   . Cholesterolosis   . Coronary artery disease   . Pneumonia   . PTSD (post-traumatic stress disorder)    Past Surgical History  Procedure Laterality Date  . Coronary angioplasty with stent placement    . Shoulder surgery    . Knee surgery    . Tonsillectomy    . Colonoscopy  02/25/2012    Procedure: COLONOSCOPY;  Surgeon: Jamesetta So, MD;  Location: [facility];  Service: Gastroenterology;  Laterality: N/A;  . Esophagogastroduodenoscopy  02/25/2012    Procedure: ESOPHAGOGASTRODUODENOSCOPY (EGD);  Surgeon: Jamesetta So, MD;  Location: [facility];  Service: Gastroenterology;  Laterality: N/A;   Review of systems history of chest pain and cough and fatigue none currently history of heartburn dysuria nervousness depression joint pain and swelling heat and muscle pain around the joints otherwise her system review is negative  BP 110/76  Ht '5\' 9"'$  (1.753 m)  Wt 170 lb (77.111 kg)  BMI 25.09 kg/m2 General appearance is normal, the patient is alert and oriented x3 with normal mood and affect. Ambulation is normal.  The lateral epicondyle is tender there is slight swelling over the extensor musculature. Range of motion is restricted in extension normal inflection normal in pronation supination, the elbow was stable to varus valgus stress test. Biceps and triceps strength are grade 5 skin is intact without rash or  laceration. Radial pulses normal. Sensation is normal in left hand and arm.  X-rays normal for an olecranon spur which clinically showed no tenderness  Impression tennis elbow left  Recommend injection, continue muscle cream and rub call back in 6 weeks if no improvement for repeat injection and/or tennis elbow brace with exercise program. Continue cryotherapy as well.  Diagnosis LEFT tennis elbow  POST PROCEDURE diagnosis same Indications pain Anesthesia ETHYL Chloride Procedure details  Verbal consent was obtained for the procedure.  Time out was taken.  The skin over the LEFT elbow was prepped with alcohol.  A 25-gauge needle was advanced at the point of maximal tenderness and 3 mL of 1% lidocaine and 1 mL of 40 mg Depo-Medrol per cc was injected.  The procedure was tolerated well  Complications none.

## 2013-06-15 NOTE — Patient Instructions (Signed)
You have received a steroid shot. 15% of patients experience increased pain at the injection site with in the next 24 hours. This is best treated with ice and tylenol extra strength 2 tabs every 8 hours. If you are still having pain please call the office.   Call in 6 weeks if not better

## 2013-11-03 ENCOUNTER — Encounter: Payer: Self-pay | Admitting: *Deleted

## 2013-11-05 ENCOUNTER — Ambulatory Visit (INDEPENDENT_AMBULATORY_CARE_PROVIDER_SITE_OTHER): Payer: Medicare Other | Admitting: Internal Medicine

## 2013-11-05 ENCOUNTER — Encounter: Payer: Self-pay | Admitting: Internal Medicine

## 2013-11-05 VITALS — BP 120/84 | HR 74 | Ht 69.75 in | Wt 186.9 lb

## 2013-11-05 DIAGNOSIS — I2089 Other forms of angina pectoris: Secondary | ICD-10-CM

## 2013-11-05 DIAGNOSIS — I208 Other forms of angina pectoris: Secondary | ICD-10-CM

## 2013-11-05 DIAGNOSIS — I2510 Atherosclerotic heart disease of native coronary artery without angina pectoris: Secondary | ICD-10-CM

## 2013-11-05 DIAGNOSIS — I209 Angina pectoris, unspecified: Secondary | ICD-10-CM

## 2013-11-05 DIAGNOSIS — F4310 Post-traumatic stress disorder, unspecified: Secondary | ICD-10-CM

## 2013-11-05 DIAGNOSIS — E785 Hyperlipidemia, unspecified: Secondary | ICD-10-CM

## 2013-11-05 DIAGNOSIS — I10 Essential (primary) hypertension: Secondary | ICD-10-CM

## 2013-11-05 NOTE — Patient Instructions (Signed)
Your physician wants you to follow-up in:  6 months. You will receive a reminder letter in the mail two months in advance. If you don't receive a letter, please call our office to schedule the follow-up appointment.   

## 2013-11-08 ENCOUNTER — Encounter: Payer: Self-pay | Admitting: Internal Medicine

## 2013-11-08 ENCOUNTER — Encounter: Payer: Self-pay | Admitting: Orthopedic Surgery

## 2013-11-08 ENCOUNTER — Ambulatory Visit (INDEPENDENT_AMBULATORY_CARE_PROVIDER_SITE_OTHER): Payer: Medicare Other | Admitting: Orthopedic Surgery

## 2013-11-08 VITALS — BP 126/75 | Ht 69.0 in | Wt 184.0 lb

## 2013-11-08 DIAGNOSIS — E785 Hyperlipidemia, unspecified: Secondary | ICD-10-CM | POA: Insufficient documentation

## 2013-11-08 DIAGNOSIS — I2510 Atherosclerotic heart disease of native coronary artery without angina pectoris: Secondary | ICD-10-CM | POA: Insufficient documentation

## 2013-11-08 DIAGNOSIS — I10 Essential (primary) hypertension: Secondary | ICD-10-CM | POA: Insufficient documentation

## 2013-11-08 DIAGNOSIS — M7710 Lateral epicondylitis, unspecified elbow: Secondary | ICD-10-CM

## 2013-11-08 DIAGNOSIS — I208 Other forms of angina pectoris: Secondary | ICD-10-CM | POA: Insufficient documentation

## 2013-11-08 DIAGNOSIS — M7712 Lateral epicondylitis, left elbow: Secondary | ICD-10-CM | POA: Insufficient documentation

## 2013-11-08 DIAGNOSIS — I2089 Other forms of angina pectoris: Secondary | ICD-10-CM | POA: Insufficient documentation

## 2013-11-08 DIAGNOSIS — F4310 Post-traumatic stress disorder, unspecified: Secondary | ICD-10-CM | POA: Insufficient documentation

## 2013-11-08 NOTE — Patient Instructions (Signed)
You have received a steroid shot. 15% of patients experience increased pain at the injection site with in the next 24 hours. This is best treated with ice and tylenol extra strength 2 tabs every 8 hours. If you are still having pain please call the office.   Brace x 6 weeks   Exercises x 6 weeks

## 2013-11-08 NOTE — Progress Notes (Signed)
Patient ID: Cory [name], male   DOB: August 03, 1946, 67 y.o.   MRN: [id number]  Chief Complaint  Patient presents with  . Follow-up    Left elbow still hurting, discuss surgery    HISTORY: Status post injection left elbow for tennis elbow for overuse. Patient says elbow hurting worse over the last week.  Denies numbness or tingling no weakness no trauma.  BP 126/75  Ht 5\' 9"  (1.753 m)  Wt 184 lb (83.462 kg)  BMI 27.16 kg/m2 General appearance is normal, the patient is alert and oriented x3 with normal mood and affect.  There's tenderness over the lateral epicondyles with lack of full extension of the elbow elbow joint is stable pain with resisted wrist extension neurovascular exam intact  Tennis elbow  Inject left tennis elbow  Lateral epicondyle injection. (left)  Consent.  Timeout to confirm site.  LEFT elbow was injected with Depo-Medrol 40 mg and lidocaine 1% 3 cc with sterile technique using alcohol and ethyl chloride prep.  There were no complications

## 2013-11-08 NOTE — Progress Notes (Signed)
OFFICE NOTE  Chief Complaint:  Routine follow-up  Primary Care Physician: Colette Ribas, MD  HPI:  Cory [name] is a 67 year old gentleman, who I have been following for coronary artery disease, status post Taxus stenting in 2002. He also has had some ongoing chronic angina controlled on Imdur 60 mg. He also has PTSD, on a number of medications, but that has been stable, as well as hypertension. Recently, he has been feeling very well.  His only complaint today has to do with some lateral epicondylitis of the left elbow for which he saw Dr. Romeo Apple. He denies any chest pain and overall is doing well.  PMHx:  Past Medical History  Diagnosis Date  . Hypertension   . Hyperlipidemia   . Coronary artery disease     stent to 1st diagonal, mod disease of ramus intermedius in the RCA  . Pneumonia   . PTSD (post-traumatic stress disorder)   . History of nuclear stress test 03/2010    bruce myoview; mild ischemia in basal anterior & mid anterior region; low risk     Past Surgical History  Procedure Laterality Date  . Coronary angioplasty with stent placement  09/2001    Taxus (2.5x53mm) stent to 1st diagonal   . Shoulder surgery    . Knee surgery    . Tonsillectomy    . Colonoscopy  02/25/2012    Procedure: COLONOSCOPY;  Surgeon: Dalia Heading, MD;  [facility]: [facility];  Service: Gastroenterology;  Laterality: N/A;  . Esophagogastroduodenoscopy  02/25/2012    Procedure: ESOPHAGOGASTRODUODENOSCOPY (EGD);  Surgeon: Dalia Heading, MD;  Location: [facility];  Service: Gastroenterology;  Laterality: N/A;  . Transthoracic echocardiogram  03/2010    EF=>55%; LV mildly dilated; LA mod dilated; mild MR & TR; normal RVSP; mild pulm valve regurg  . Cardiac catheterization  05/17/2010    left main free of significant lesion; LAD with 60% lesion in mid segment & 60-70% lesion in mid-distal segment with subtotal occlusion at apex & small side branch of diagonal 1 that is subtotally  occluded; ramus intermedius with 90% ostial stenosis; L Cfx w/mild luminal irreg; RCA is dominant vessel with diffuse disease, 50% prox lesion & 60% lesion in mid segment & 60% lesion in PDA    FAMHx:  No family history on file.  SOCHx:   reports that he has never smoked. He has never used smokeless tobacco. He reports that he does not drink alcohol or use illicit drugs.  ALLERGIES:  Allergies  Allergen Reactions  . Benadryl [Diphenhydramine Hcl]     Opposite reaction of medication purpose - hyper  . Benadryl [Diphenhydramine-Zinc Acetate]   . Vioxx [Rofecoxib] Other (See Comments)    headache    ROS: A comprehensive review of systems was negative except for: Musculoskeletal: positive for left elbow pain  HOME MEDS: Current Outpatient Prescriptions  Medication Sig Dispense Refill  . aspirin EC 81 MG tablet Take 162 mg by mouth at bedtime.        . citalopram (CELEXA) 40 MG tablet Take 40 mg by mouth daily.      . diclofenac (VOLTAREN) 75 MG EC tablet Take 75 mg by mouth 2 (two) times daily.        . fish oil-omega-3 fatty acids 1000 MG capsule Take 1 g by mouth daily.       Marland Kitchen gabapentin (NEURONTIN) 300 MG capsule Take 300 mg by mouth 3 (three) times daily.      Marland Kitchen  isosorbide mononitrate (IMDUR) 60 MG 24 hr tablet Take 60 mg by mouth daily.        Marland Kitchen lisinopril (PRINIVIL,ZESTRIL) 5 MG tablet Take 10 mg by mouth daily.        . niacin (SLO-NIACIN) 500 MG tablet Take 1,000 mg by mouth at bedtime.       Marland Kitchen omeprazole (PRILOSEC) 20 MG capsule Take 20 mg by mouth 2 (two) times daily.        Marland Kitchen PARoxetine (PAXIL) 40 MG tablet Take 40 mg by mouth every morning.        . polysaccharide iron (NIFEREX) 150 MG CAPS capsule Take 150 mg by mouth 2 (two) times daily.       . simvastatin (ZOCOR) 80 MG tablet Take 40 mg by mouth at bedtime.      . traZODone (DESYREL) 100 MG tablet Take 100 mg by mouth at bedtime.       . vitamin C (ASCORBIC ACID) 500 MG tablet Take 1,000 mg by mouth at bedtime.         No current facility-administered medications for this visit.    LABS/IMAGING: No results found for this or any previous visit (from the past 48 hour(s)). No results found.  VITALS: BP 120/84  Pulse 74  Ht 5' 9.75" (1.772 m)  Wt 186 lb 14.4 oz (84.777 kg)  BMI 27.00 kg/m2  EXAM: General appearance: alert and no distress Neck: no carotid bruit and no JVD Lungs: clear to auscultation bilaterally Heart: regular rate and rhythm, S1, S2 normal, no murmur, click, rub or gallop Abdomen: soft, non-tender; bowel sounds normal; no masses,  no organomegaly Extremities: extremities normal, atraumatic, no cyanosis or edema Pulses: 2+ and symmetric Skin: Skin color, texture, turgor normal. No rashes or lesions Neurologic: Grossly normal Psych: Mood, affect normal  EKG: Normal sinus rhythm at 74  ASSESSMENT: 1. Coronary artery disease status post Taxus DES to the first diagonal and 2002 2. PTSD 3. Hypertension 4. Dyslipidemia 5. Stable angina on into her  PLAN: 1.   [name] is doing well except for complaints of a left lateral epicondylitis he developed after lifting heavy things. He denies any chest pain and his blood pressure control has been adequate. I recommend he continue his current medications and will plan to see him back in 6 months.  Chrystie Nose, MD, [facility] Attending Cardiologist [facility]  Oria Klimas C 11/08/2013, 11:18 AM

## 2013-12-27 ENCOUNTER — Other Ambulatory Visit (HOSPITAL_COMMUNITY): Payer: Self-pay | Admitting: Pulmonary Disease

## 2013-12-27 ENCOUNTER — Ambulatory Visit (HOSPITAL_COMMUNITY)
Admission: RE | Admit: 2013-12-27 | Discharge: 2013-12-27 | Disposition: A | Discharge status: Home / Self Care | Payer: Medicare Other | Source: Ambulatory Visit | Attending: Pulmonary Disease | Admitting: Pulmonary Disease

## 2013-12-27 DIAGNOSIS — R05 Cough: Secondary | ICD-10-CM | POA: Insufficient documentation

## 2013-12-27 DIAGNOSIS — R059 Cough, unspecified: Secondary | ICD-10-CM

## 2013-12-28 ENCOUNTER — Encounter: Payer: Self-pay | Admitting: Orthopedic Surgery

## 2013-12-28 ENCOUNTER — Other Ambulatory Visit: Payer: Self-pay | Admitting: *Deleted

## 2013-12-28 ENCOUNTER — Ambulatory Visit (INDEPENDENT_AMBULATORY_CARE_PROVIDER_SITE_OTHER): Payer: Medicare Other | Admitting: Orthopedic Surgery

## 2013-12-28 VITALS — BP 133/84 | Ht 69.0 in | Wt 184.0 lb

## 2013-12-28 DIAGNOSIS — M7712 Lateral epicondylitis, left elbow: Secondary | ICD-10-CM

## 2013-12-28 DIAGNOSIS — M7710 Lateral epicondylitis, unspecified elbow: Secondary | ICD-10-CM

## 2013-12-28 NOTE — Patient Instructions (Addendum)
Tennis elbow release left elbow Lateral Epicondylitis (Tennis Elbow) with Rehab Lateral epicondylitis involves inflammation and pain around the outer portion of the elbow. The pain is caused by inflammation of the tendons in the forearm that bring back (extend) the wrist. Lateral epicondylittis is also called tennis elbow, because it is very common in tennis players. However, it may occur in any individual who extends the wrist repetitively. If lateral epicondylitis is left untreated, it may become a chronic problem. SYMPTOMS   Pain, tenderness, and inflammation on the outer (lateral) side of the elbow.  Pain or weakness with gripping activities.  Pain that increases with wrist twisting motions (playing tennis, using a screwdriver, opening a door or a jar).  Pain with lifting objects, including a coffee cup. CAUSES  Lateral epicondylitis is caused by inflammation of the tendons that extend the wrist. Causes of injury may include:  Repetitive stress and strain on the muscles and tendons that extend the wrist.  Sudden change in activity level or intensity.  Incorrect grip in racquet sports.  Incorrect grip size of racquet (often too large).  Incorrect hitting position or technique (usually backhand, leading with the elbow).  Using a racket that is too heavy. RISK INCREASES WITH:  Sports or occupations that require repetitive and/or strenuous forearm and wrist movements (tennis, squash, racquetball, carpentry).  Poor wrist and forearm strength and flexibility.  Failure to warm up properly before activity.  Resuming activity before healing, rehabilitation, and conditioning are complete. PREVENTION   Warm up and stretch properly before activity.  Maintain physical fitness:  Strength, flexibility, and endurance.  Cardiovascular fitness.  Wear and use properly fitted equipment.  Learn and use proper technique and have a coach correct improper technique.  Wear a tennis elbow  (counterforce) brace. PROGNOSIS  The course of this condition depends on the degree of the injury. If treated properly, acute cases (symptoms lasting less than 4 weeks) are often resolved in 2 to 6 weeks. Chronic (longer lasting cases) often resolve in 3 to 6 months, but may require physical therapy. RELATED COMPLICATIONS   Frequently recurring symptoms, resulting in a chronic problem. Properly treating the problem the first time decreases frequency of recurrence.  Chronic inflammation, scarring tendon degeneration, and partial tendon tear, requiring surgery.  Delayed healing or resolution of symptoms. TREATMENT  Treatment first involves the use of ice and medicine, to reduce pain and inflammation. Strengthening and stretching exercises may help reduce discomfort, if performed regularly. These exercises may be performed at home, if the condition is an acute injury. Chronic cases may require a referral to a physical therapist for evaluation and treatment. Your caregiver may advise a corticosteroid injection, to help reduce inflammation. Rarely, surgery is needed. MEDICATION  If pain medicine is needed, nonsteroidal anti-inflammatory medicines (aspirin and ibuprofen), or other minor pain relievers (acetaminophen), are often advised.  Do not take pain medicine for 7 days before surgery.  Prescription pain relievers may be given, if your caregiver thinks they are needed. Use only as directed and only as much as you need.  Corticosteroid injections may be recommended. These injections should be reserved only for the most severe cases, because they can only be given a certain number of times. HEAT AND COLD  Cold treatment (icing) should be applied for 10 to 15 minutes every 2 to 3 hours for inflammation and pain, and immediately after activity that aggravates your symptoms. Use ice packs or an ice massage.  Heat treatment may be used before performing stretching and  strengthening activities  prescribed by your caregiver, physical therapist, or athletic trainer. Use a heat pack or a warm water soak. SEEK MEDICAL CARE IF: Symptoms get worse or do not improve in 2 weeks, despite treatment.

## 2013-12-28 NOTE — Progress Notes (Signed)
Patient ID: Cory [name], male   DOB: May 18, 1946, 67 y.o.   MRN: [id number]  Chief Complaint  Patient presents with  . Follow-up    6 week recheck left tennis elbow    HISTORY: Chief Complaint   Patient presents with   .  Elbow Pain       Left elbow pain, no injury.     2 weeks of gradual onset of left elbow pain in this 67 year old active left-hand-dominant male who is been building a play house for his grandchildren. He complains of pain over the lateral epicondyle which is sharp constant slightly improved with a muscle cream and associated with swelling and difficulties with power grip  Cory [name] presents after several injections for lateral tennis elbow. His treatment included injection, ice, bracing, activity modification. He continues to have lateral elbow pain which is interfering with his activities of daily living. He would like to proceed with surgical intervention as nonoperative treatment has not produced results he would like. We discussed risks benefits and postoperative care. He has given informed consent to proceed with surgery  Clinical exam shows stable vital signs BP 133/84  Ht 5\' 9"  (1.753 m)  Wt 184 lb (83.462 kg)  BMI 27.16 kg/m2 General appearance is normal, the patient is alert and oriented x3 with normal mood and affect. Ambulatory status is normal His lateral elbow is tender over the lateral epicondyle His range of motion is full in flexion and he has painful terminal extension Elbow ligaments are stable Strength around the elbow is normal Skin is intact Has a normal radial and ulnar pulse There are no sensation deficits Lymph nodes are normal No pathologic reflexes Coordination is normal  X-ray was negative  Encounter Diagnosis  Name Primary?  . Left tennis elbow Yes    We will perform a straightforward lateral release open technique left elbow

## 2014-01-05 ENCOUNTER — Telehealth: Payer: Self-pay | Admitting: Orthopedic Surgery

## 2014-01-06 ENCOUNTER — Encounter (HOSPITAL_COMMUNITY): Payer: Self-pay | Admitting: Pharmacy Technician

## 2014-01-14 NOTE — Patient Instructions (Signed)
Cory [name]  01/14/2014   Your procedure is scheduled on:  01/21/2014  Report to [facility] at  21  AM.  Call this number if you have problems the morning of surgery: [phone]   Remember:   Do not eat food or drink liquids after midnight.   Take these medicines the morning of surgery with A SIP OF WATER: celexa, voltaren, gabapentin, imdur, prilosec, paxil, effexor   Do not wear jewelry, make-up or nail polish.  Do not wear lotions, powders, or perfumes.   Do not shave 48 hours prior to surgery. Men may shave face and neck.  Do not bring valuables to the hospital.  [facility] is not responsible for any belongings or valuables.               Contacts, dentures or bridgework may not be worn into surgery.  Leave suitcase in the car. After surgery it may be brought to your room.  For patients admitted to the hospital, discharge time is determined by your treatment team.               Patients discharged the day of surgery will not be allowed to drive home.  Name and phone number of your driver: family  Special Instructions: Shower using CHG 2 nights before surgery and the night before surgery.  If you shower the day of surgery use CHG.  Use special wash - you have one bottle of CHG for all showers.  You should use approximately 1/3 of the bottle for each shower.   Please read over the following fact sheets that you were given: Pain Booklet, Coughing and Deep Breathing, Surgical Site Infection Prevention, Anesthesia Post-op Instructions and Care and Recovery After Surgery Tennis Elbow Tennis elbow can happen in any sport or job where you overuse your elbow. It is caused by doing the same motion over and over. This makes small tears in the forearm muscles. It can also cause muscle redness, soreness, and puffiness (inflammation). HOME CARE  Rest.  Use your other hand or arm that is not affected. Change your grip.  Only take medicine as told by your doctor.  Put ice on  the area after activity.  Put ice in a plastic bag.  Place a towel between your skin and the bag.  Leave the ice on for 15-20 minutes, 03-04 times a day.  Wear a splint or sling as told by your doctor. This lets the area rest and heal faster. GET HELP RIGHT AWAY IF: Your pain gets worse or does not improve in 2 weeks even with treatment. MAKE SURE YOU:   Understand these instructions.  Will watch your condition.  Will get help right away if you are not doing well or get worse. Document Released: 06/05/2010 Document Revised: 03/09/2012 Document Reviewed: 06/05/2010 [facility] Patient Information 2014 [place]. PATIENT INSTRUCTIONS POST-ANESTHESIA  IMMEDIATELY FOLLOWING SURGERY:  Do not drive or operate machinery for the first twenty four hours after surgery.  Do not make any important decisions for twenty four hours after surgery or while taking narcotic pain medications or sedatives.  If you develop intractable nausea and vomiting or a severe headache please notify your doctor immediately.  FOLLOW-UP:  Please make an appointment with your surgeon as instructed. You do not need to follow up with anesthesia unless specifically instructed to do so.  WOUND CARE INSTRUCTIONS (if applicable):  Keep a dry clean dressing on the anesthesia/puncture wound site if there is  drainage.  Once the wound has quit draining you may leave it open to air.  Generally you should leave the bandage intact for twenty four hours unless there is drainage.  If the epidural site drains for more than 36-48 hours please call the anesthesia department.  QUESTIONS?:  Please feel free to call your physician or the hospital operator if you have any questions, and they will be happy to assist you.

## 2014-01-17 ENCOUNTER — Encounter (HOSPITAL_COMMUNITY): Payer: Self-pay

## 2014-01-17 ENCOUNTER — Encounter (HOSPITAL_COMMUNITY)
Admission: RE | Admit: 2014-01-17 | Discharge: 2014-01-17 | Disposition: A | Discharge status: Home / Self Care | Payer: Medicare Other | Source: Ambulatory Visit | Attending: Orthopedic Surgery | Admitting: Orthopedic Surgery

## 2014-01-17 DIAGNOSIS — Z01818 Encounter for other preprocedural examination: Secondary | ICD-10-CM | POA: Insufficient documentation

## 2014-01-17 DIAGNOSIS — Z01812 Encounter for preprocedural laboratory examination: Secondary | ICD-10-CM | POA: Insufficient documentation

## 2014-01-17 LAB — BASIC METABOLIC PANEL
BUN: 16 mg/dL (ref 6–23)
CO2: 26 mEq/L (ref 19–32)
Calcium: 9.3 mg/dL (ref 8.4–10.5)
Chloride: 103 mEq/L (ref 96–112)
Creatinine, Ser: 0.98 mg/dL (ref 0.50–1.35)
GFR calc Af Amer: >90 mL/min (ref >90)
GFR calc non Af Amer: 84 mL/min — ABNORMAL LOW (ref >90)
Glucose, Bld: 102 mg/dL — ABNORMAL HIGH (ref 70–99)
Potassium: 4 mEq/L (ref 3.7–5.3)
Sodium: 139 mEq/L (ref 137–147)

## 2014-01-17 LAB — HEMOGLOBIN AND HEMATOCRIT, BLOOD
HCT: 41.8 % (ref 39.0–52.0)
Hemoglobin: 14.2 g/dL (ref 13.0–17.0)

## 2014-01-20 NOTE — H&P (Signed)
  Chief Complaint   Patient presents with   .  Elbow Pain       Left elbow pain, no injury.     8 mos ago he noticed  gradual onset of left elbow pain in this 68 year old active left-hand-dominant male who is been building a play house for his grandchildren. He complains of pain over the lateral epicondyle which is sharp constant slightly improved with a muscle cream and associated with swelling and difficulties with power grip. He was treated with 2 cortisone injections a tennis elbow brace and exercises did not improve. Now he presents for tennis elbow release and reattachment  No family history on file. History  Substance Use Topics  . Smoking status: Never Smoker   . Smokeless tobacco: Never Used  . Alcohol Use: No      Past Medical History   Diagnosis  Date   .  Hypertension     .  Cholesterolosis     .  Coronary artery disease     .  Pneumonia     .  PTSD (post-traumatic stress disorder)        Past Surgical History   Procedure  Laterality  Date   .  Coronary angioplasty with stent placement       .  Shoulder surgery       .  Knee surgery       .  Tonsillectomy       .  Colonoscopy    02/25/2012       Procedure: COLONOSCOPY;  Surgeon: Jamesetta So, MD;  [facility]: [facility];  Service: Gastroenterology;  Laterality: N/A;   .  Esophagogastroduodenoscopy    02/25/2012       Procedure: ESOPHAGOGASTRODUODENOSCOPY (EGD);  Surgeon: Jamesetta So, MD;  Location: [facility];  Service: Gastroenterology;  Laterality: N/A;    Review of systems history of chest pain and cough and fatigue none currently history of heartburn dysuria nervousness depression joint pain and swelling heat and muscle pain around the joints otherwise her system review is negative  BP 110/76  Ht 5' 9"$  (1.753 m)  Wt 170 lb (77.111 kg)  BMI 25.09 kg/m2 General appearance is normal, the patient is alert and oriented x3 with normal mood and affect. Ambulation is normal. Lower extremity exam  Ambulation  is normal.  The right and left lower extremity:  Inspection and palpation revealed no tenderness or abnormality in alignment in the lower extremities. Range of motion is full.  Strength is grade 5.  All joints are stable.  Right upper extremity is normal  Left elbow The lateral epicondyle is tender there is slight swelling over the extensor musculature. Range of motion is restricted in extension normal inflection normal in pronation supination, the elbow was stable to varus valgus stress test. Biceps and triceps strength are grade 5 skin is intact without rash or laceration. Radial pulses normal. Sensation is normal in left hand and arm.  X-rays normal for an olecranon spur which clinically showed no tenderness  Impression tennis elbow left  Plan left tennis elbow release and reattachment

## 2014-01-21 ENCOUNTER — Ambulatory Visit (HOSPITAL_COMMUNITY)
Admission: RE | Admit: 2014-01-21 | Discharge: 2014-01-21 | Disposition: A | Discharge status: Home / Self Care | Payer: Medicare Other | Source: Ambulatory Visit | Attending: Orthopedic Surgery | Admitting: Orthopedic Surgery

## 2014-01-21 ENCOUNTER — Encounter (HOSPITAL_COMMUNITY): Payer: Self-pay | Admitting: *Deleted

## 2014-01-21 ENCOUNTER — Ambulatory Visit (HOSPITAL_COMMUNITY): Payer: Medicare Other | Admitting: Anesthesiology

## 2014-01-21 ENCOUNTER — Encounter (HOSPITAL_COMMUNITY)
Admission: RE | Disposition: A | Discharge status: Home / Self Care | Payer: Self-pay | Source: Ambulatory Visit | Attending: Orthopedic Surgery

## 2014-01-21 ENCOUNTER — Encounter (HOSPITAL_COMMUNITY): Payer: Medicare Other | Admitting: Anesthesiology

## 2014-01-21 DIAGNOSIS — I10 Essential (primary) hypertension: Secondary | ICD-10-CM | POA: Insufficient documentation

## 2014-01-21 DIAGNOSIS — M7710 Lateral epicondylitis, unspecified elbow: Secondary | ICD-10-CM | POA: Insufficient documentation

## 2014-01-21 DIAGNOSIS — M7712 Lateral epicondylitis, left elbow: Secondary | ICD-10-CM

## 2014-01-21 HISTORY — PX: LATERAL EPICONDYLE RELEASE: SHX1958

## 2014-01-21 SURGERY — TENNIS ELBOW RELEASE/NIRSCHEL PROCEDURE
Anesthesia: General | Site: Elbow | Laterality: Left

## 2014-01-21 MED ORDER — CHLORHEXIDINE GLUCONATE 4 % EX LIQD: 60.0000 mL | Freq: Once | CUTANEOUS | Status: DC

## 2014-01-21 MED ORDER — SODIUM CHLORIDE 0.9 % IR SOLN
Status: DC | PRN
  Administered 2014-01-21: 1000 mL

## 2014-01-21 MED ORDER — GLYCOPYRROLATE 0.2 MG/ML IJ SOLN
0.2000 mg | Freq: Once | INTRAMUSCULAR | Status: AC
  Administered 2014-01-21: 0.2 mg via INTRAVENOUS

## 2014-01-21 MED ORDER — FENTANYL CITRATE 0.05 MG/ML IJ SOLN
INTRAMUSCULAR | Status: AC
  Filled 2014-01-21: qty 5

## 2014-01-21 MED ORDER — FENTANYL CITRATE 0.05 MG/ML IJ SOLN
25.0000 ug | INTRAMUSCULAR | Status: AC
  Administered 2014-01-21 (×2): 25 ug via INTRAVENOUS

## 2014-01-21 MED ORDER — ONDANSETRON HCL 4 MG/2ML IJ SOLN
4.0000 mg | Freq: Once | INTRAMUSCULAR | Status: AC
  Administered 2014-01-21: 4 mg via INTRAVENOUS

## 2014-01-21 MED ORDER — PROPOFOL 10 MG/ML IV BOLUS
INTRAVENOUS | Status: DC | PRN
  Administered 2014-01-21: 150 mg via INTRAVENOUS
  Administered 2014-01-21: 25 mg via INTRAVENOUS

## 2014-01-21 MED ORDER — FENTANYL CITRATE 0.05 MG/ML IJ SOLN: 25.0000 ug | INTRAMUSCULAR | Status: DC | PRN

## 2014-01-21 MED ORDER — CEFAZOLIN SODIUM-DEXTROSE 2-3 GM-% IV SOLR
2.0000 g | INTRAVENOUS | Status: AC
  Administered 2014-01-21: 1 g via INTRAVENOUS
  Filled 2014-01-21: qty 50

## 2014-01-21 MED ORDER — GLYCOPYRROLATE 0.2 MG/ML IJ SOLN
INTRAMUSCULAR | Status: AC
  Filled 2014-01-21: qty 1

## 2014-01-21 MED ORDER — BUPIVACAINE-EPINEPHRINE 0.5% -1:200000 IJ SOLN
INTRAMUSCULAR | Status: DC | PRN
  Administered 2014-01-21: 30 mL

## 2014-01-21 MED ORDER — MIDAZOLAM HCL 2 MG/2ML IJ SOLN
1.0000 mg | INTRAMUSCULAR | Status: DC | PRN
  Administered 2014-01-21: 2 mg via INTRAVENOUS

## 2014-01-21 MED ORDER — MIDAZOLAM HCL 2 MG/2ML IJ SOLN
INTRAMUSCULAR | Status: AC
  Filled 2014-01-21: qty 2

## 2014-01-21 MED ORDER — LIDOCAINE HCL 1 % IJ SOLN
INTRAMUSCULAR | Status: DC | PRN
  Administered 2014-01-21: 30 mg via INTRADERMAL

## 2014-01-21 MED ORDER — ONDANSETRON HCL 4 MG/2ML IJ SOLN
4.0000 mg | Freq: Once | INTRAMUSCULAR | Status: DC | PRN
Start: 2014-01-21 — End: 2014-01-21

## 2014-01-21 MED ORDER — BUPIVACAINE-EPINEPHRINE PF 0.5-1:200000 % IJ SOLN
INTRAMUSCULAR | Status: AC
  Filled 2014-01-21: qty 10

## 2014-01-21 MED ORDER — ONDANSETRON HCL 4 MG/2ML IJ SOLN
INTRAMUSCULAR | Status: AC
  Filled 2014-01-21: qty 2

## 2014-01-21 MED ORDER — LIDOCAINE HCL (PF) 1 % IJ SOLN
INTRAMUSCULAR | Status: AC
  Filled 2014-01-21: qty 5

## 2014-01-21 MED ORDER — HYDROCODONE-ACETAMINOPHEN 5-325 MG PO TABS: 1.0000 | ORAL_TABLET | ORAL | Status: DC | PRN

## 2014-01-21 MED ORDER — PROPOFOL 10 MG/ML IV BOLUS
INTRAVENOUS | Status: AC
  Filled 2014-01-21: qty 20

## 2014-01-21 MED ORDER — LACTATED RINGERS IV SOLN
INTRAVENOUS | Status: DC
  Administered 2014-01-21 (×2): via INTRAVENOUS

## 2014-01-21 MED ORDER — FENTANYL CITRATE 0.05 MG/ML IJ SOLN
INTRAMUSCULAR | Status: DC | PRN
  Administered 2014-01-21: 50 ug via INTRAVENOUS
  Administered 2014-01-21 (×4): 25 ug via INTRAVENOUS

## 2014-01-21 MED ORDER — FENTANYL CITRATE 0.05 MG/ML IJ SOLN
INTRAMUSCULAR | Status: AC
  Filled 2014-01-21: qty 2

## 2014-01-21 SURGICAL SUPPLY — 58 items
BAG HAMPER (MISCELLANEOUS) ×2 IMPLANT
BANDAGE ELASTIC 3 VELCRO NS (GAUZE/BANDAGES/DRESSINGS) ×2 IMPLANT
BANDAGE ELASTIC 4 VELCRO NS (GAUZE/BANDAGES/DRESSINGS) ×2 IMPLANT
BANDAGE ELASTIC 6 VELCRO NS (GAUZE/BANDAGES/DRESSINGS) IMPLANT
BANDAGE ESMARK 4X12 BL STRL LF (DISPOSABLE) ×1 IMPLANT
BANDAGE GAUZE ELAST BULKY 4 IN (GAUZE/BANDAGES/DRESSINGS) ×2 IMPLANT
BENZOIN TINCTURE PRP APPL 2/3 (GAUZE/BANDAGES/DRESSINGS) ×2 IMPLANT
BIT DRILL 2.0MX128MM (BIT) ×2 IMPLANT
BLADE SURG SZ10 CARB STEEL (BLADE) ×2 IMPLANT
BNDG COHESIVE 4X5 TAN NS LF (GAUZE/BANDAGES/DRESSINGS) IMPLANT
BNDG ESMARK 4X12 BLUE STRL LF (DISPOSABLE) ×2
CHLORAPREP W/TINT 26ML (MISCELLANEOUS) ×2 IMPLANT
CLOTH BEACON ORANGE TIMEOUT ST (SAFETY) ×2 IMPLANT
COVER LIGHT HANDLE STERIS (MISCELLANEOUS) ×4 IMPLANT
CUFF TOURNIQUET SINGLE 18IN (TOURNIQUET CUFF) ×2 IMPLANT
DECANTER SPIKE VIAL GLASS SM (MISCELLANEOUS) ×2 IMPLANT
DRAPE PROXIMA HALF (DRAPES) ×2 IMPLANT
DRSG XEROFORM 1X8 (GAUZE/BANDAGES/DRESSINGS) IMPLANT
ELECT REM PT RETURN 9FT ADLT (ELECTROSURGICAL) ×2
ELECTRODE REM PT RTRN 9FT ADLT (ELECTROSURGICAL) ×1 IMPLANT
GAUZE XEROFORM 5X9 LF (GAUZE/BANDAGES/DRESSINGS) ×2 IMPLANT
GLOVE BIOGEL PI IND STRL 7.0 (GLOVE) ×2 IMPLANT
GLOVE BIOGEL PI INDICATOR 7.0 (GLOVE) ×2
GLOVE ECLIPSE 6.5 STRL STRAW (GLOVE) ×4 IMPLANT
GLOVE EXAM NITRILE MD LF STRL (GLOVE) ×2 IMPLANT
GLOVE SKINSENSE NS SZ8.0 LF (GLOVE) ×2
GLOVE SKINSENSE STRL SZ8.0 LF (GLOVE) ×2 IMPLANT
GLOVE SS N UNI LF 8.5 STRL (GLOVE) ×2 IMPLANT
GOWN STRL REUS W/TWL LRG LVL3 (GOWN DISPOSABLE) ×4 IMPLANT
GOWN STRL REUS W/TWL XL LVL3 (GOWN DISPOSABLE) ×2 IMPLANT
INST SET MINOR BONE (KITS) ×2 IMPLANT
KIT ROOM TURNOVER APOR (KITS) ×2 IMPLANT
MANIFOLD NEPTUNE II (INSTRUMENTS) ×2 IMPLANT
NEEDLE HYPO 21X1.5 SAFETY (NEEDLE) ×2 IMPLANT
NS IRRIG 1000ML POUR BTL (IV SOLUTION) ×2 IMPLANT
PACK BASIC LIMB (CUSTOM PROCEDURE TRAY) ×2 IMPLANT
PAD ABD 5X9 TENDERSORB (GAUZE/BANDAGES/DRESSINGS) IMPLANT
PAD ARMBOARD 7.5X6 YLW CONV (MISCELLANEOUS) ×2 IMPLANT
PAD CAST 4YDX4 CTTN HI CHSV (CAST SUPPLIES) IMPLANT
PADDING CAST COTTON 4X4 STRL (CAST SUPPLIES)
SET BASIN LINEN APH (SET/KITS/TRAYS/PACK) ×2 IMPLANT
SPLINT IMMOBILIZER J 3INX20FT (CAST SUPPLIES)
SPLINT J IMMOBILIZER 3X20FT (CAST SUPPLIES) IMPLANT
SPONGE GAUZE 4X4 12PLY (GAUZE/BANDAGES/DRESSINGS) ×2 IMPLANT
SPONGE LAP 18X18 X RAY DECT (DISPOSABLE) ×2 IMPLANT
STAPLER VISISTAT 35W (STAPLE) IMPLANT
STRIP CLOSURE SKIN 1/2X4 (GAUZE/BANDAGES/DRESSINGS) ×2 IMPLANT
SUT ETHIBOND 2 V 37 (SUTURE) IMPLANT
SUT ETHILON 3 0 FSL (SUTURE) IMPLANT
SUT MON AB 2-0 SH 27 (SUTURE) ×2
SUT MON AB 2-0 SH27 (SUTURE) ×1 IMPLANT
SUT PROLENE 3 0 PS 1 (SUTURE) IMPLANT
SUT VIC AB 1 CT1 27 (SUTURE) ×2
SUT VIC AB 1 CT1 27XBRD ANTBC (SUTURE) ×1 IMPLANT
SUT VICRYL AB 3-0 FS1 BRD 27IN (SUTURE) IMPLANT
SYR 30ML LL (SYRINGE) ×2 IMPLANT
SYR CONTROL 10ML LL (SYRINGE) ×2 IMPLANT
TOWEL OR 17X26 4PK STRL BLUE (TOWEL DISPOSABLE) ×2 IMPLANT

## 2014-01-21 NOTE — Brief Op Note (Signed)
01/21/2014  8:36 AM  PATIENT:  Cory [name]  68 y.o. male  PRE-OPERATIVE DIAGNOSIS:  Left Tennis Elbow  POST-OPERATIVE DIAGNOSIS:  Left Tennis Elbow  PROCEDURE:  Procedure(s): Left Tennis Elbow Release with debridement  tendon repair with reattachment (Left)  SURGEON:  Surgeon(s) and Role:    * Carole Civil, MD - Primary  PHYSICIAN ASSISTANT:   ASSISTANTS: debbie dallas   ANESTHESIA:   general  EBL:  Total I/O In: 1000 [I.V.:1000] Out: 0   BLOOD ADMINISTERED:none  DRAINS: none   LOCAL MEDICATIONS USED:  MARCAINE   , Amount: 30 ml and OTHER epi  SPECIMEN:  No Specimen  DISPOSITION OF SPECIMEN:  N/A  COUNTS:  YES  TOURNIQUET:   Total Tourniquet Time Documented: Upper Arm (Left) - 23 minutes Total: Upper Arm (Left) - 23 minutes   DICTATION: .Viviann Spare Dictation  PLAN OF CARE: Discharge to home after PACU  PATIENT DISPOSITION:  PACU - hemodynamically stable.   Delay start of Pharmacological VTE agent (>24hrs) due to surgical blood loss or risk of bleeding: not applicable  Details of procedure Left tennis elbow release and reattachment with tendon debridement  The surgical site was confirmed with the patient and marked in the preop area. Chart review was completed. The patient was taken to the operating room for general anesthesia. The left upper to mid was prepped and draped sterilely. A sterile tourniquet was placed. Timeout procedure was executed.  The limb was exsanguinated with a 4 inch Esmarch and the tourniquet was inflated to 250 mm of mercury.  A lateral incision was made over the lateral epicondyle starting at the level of the radial head and extending 2 cm above the lateral epicondyles. The subcutaneous tissue was divided. The tendon is fascial expansions of the extensor carpi radialis longus and aponeurosis was identified and the division was split longitudinally. The extensor carpi radialis brevis was identified and debrided removing it from  the bone. A small capsulotomy was performed to inspect the joint. 1-1/2 cc of synovial fluid was expressed and appeared abnormal mucin content. The undersurface of the extensor aponeurosis was debrided. 2 drill holes were placed in the lateral epicondyle after debriding it with a rongeur. The tendon was reattached with #1 Vicryl suture. 2-0 Monocryl suture was used to close the skin Steri-Strips were applied. 30 cc of Marcaine was injected around the skin edges.  The tourniquet was released and a sterile bandage was applied

## 2014-01-21 NOTE — Anesthesia Postprocedure Evaluation (Signed)
  Anesthesia Post-op Note  Patient: Cory [name]  Procedure(s) Performed: Procedure(s): Left Tennis Elbow Release with debridement  tendon repair with reattachment (Left)  Patient Location: PACU  Anesthesia Type:General  Level of Consciousness: awake, alert  and oriented  Airway and Oxygen Therapy: Patient Spontanous Breathing and Patient connected to face mask oxygen  Post-op Pain: none  Post-op Assessment: Post-op Vital signs reviewed, Patient's Cardiovascular Status Stable, Respiratory Function Stable, Patent Airway and No signs of Nausea or vomiting  Post-op Vital Signs: Reviewed and stable  Complications: No apparent anesthesia complications

## 2014-01-21 NOTE — Anesthesia Procedure Notes (Signed)
Procedure Name: LMA Insertion Date/Time: 01/21/2014 7:52 AM Performed by: Tressie Stalker E Pre-anesthesia Checklist: Patient identified, Patient being monitored, Emergency Drugs available, Timeout performed and Suction available Patient Re-evaluated:Patient Re-evaluated prior to inductionOxygen Delivery Method: Circle System Utilized Preoxygenation: Pre-oxygenation with 100% oxygen Intubation Type: IV induction Ventilation: Mask ventilation without difficulty LMA: LMA inserted LMA Size: 4.0 Number of attempts: 1 Placement Confirmation: positive ETCO2 and breath sounds checked- equal and bilateral

## 2014-01-21 NOTE — Discharge Instructions (Signed)
Keep the dressing clean and dry  Move fingers as tolerated  Do lift anything heavier than a coffee cup

## 2014-01-21 NOTE — Anesthesia Preprocedure Evaluation (Signed)
Anesthesia Evaluation  Patient identified by MRN, date of birth, ID band Patient awake    Reviewed: Allergy & Precautions, H&P , NPO status , Patient's Chart, lab work & pertinent test results  Airway Mallampati: I TM Distance: >3 FB     Dental  (+) Teeth Intact   Pulmonary sleep apnea ,  breath sounds clear to auscultation        Cardiovascular hypertension, Pt. on medications + angina (none recently) + CAD and + Cardiac Stents Rhythm:Regular Rate:Normal     Neuro/Psych PSYCHIATRIC DISORDERS (PTSD)    GI/Hepatic GERD-  Medicated and Controlled,  Endo/Other    Renal/GU      Musculoskeletal   Abdominal   Peds  Hematology   Anesthesia Other Findings   Reproductive/Obstetrics                           Anesthesia Physical Anesthesia Plan  ASA: III  Anesthesia Plan: General   Post-op Pain Management:    Induction: Intravenous  Airway Management Planned: LMA  Additional Equipment:   Intra-op Plan:   Post-operative Plan: Extubation in OR  Informed Consent: I have reviewed the patients History and Physical, chart, labs and discussed the procedure including the risks, benefits and alternatives for the proposed anesthesia with the patient or authorized representative who has indicated his/her understanding and acceptance.     Plan Discussed with:   Anesthesia Plan Comments:         Anesthesia Quick Evaluation

## 2014-01-21 NOTE — Transfer of Care (Signed)
Immediate Anesthesia Transfer of Care Note  Patient: Cory [name]  Procedure(s) Performed: Procedure(s): Left Tennis Elbow Release with debridement  tendon repair with reattachment (Left)  Patient Location: PACU  Anesthesia Type:General  Level of Consciousness: awake and alert   Airway & Oxygen Therapy: Patient Spontanous Breathing and Patient connected to face mask oxygen  Post-op Assessment: Report given to PACU RN  Post vital signs: Reviewed and stable  Complications: No apparent anesthesia complications

## 2014-01-21 NOTE — Op Note (Signed)
01/21/2014  8:36 AM  PATIENT:  Cory [name]  68 y.o. male  PRE-OPERATIVE DIAGNOSIS:  Left Tennis Elbow  POST-OPERATIVE DIAGNOSIS:  Left Tennis Elbow  PROCEDURE:  Procedure(s): Left Tennis Elbow Release with debridement  tendon repair with reattachment (Left)  SURGEON:  Surgeon(s) and Role:    * Carole Civil, MD - Primary  PHYSICIAN ASSISTANT:   ASSISTANTS: debbie dallas   ANESTHESIA:   general  EBL:  Total I/O In: 1000 [I.V.:1000] Out: 0   BLOOD ADMINISTERED:none  DRAINS: none   LOCAL MEDICATIONS USED:  MARCAINE   , Amount: 30 ml and OTHER epi  SPECIMEN:  No Specimen  DISPOSITION OF SPECIMEN:  N/A  COUNTS:  YES  TOURNIQUET:   Total Tourniquet Time Documented: Upper Arm (Left) - 23 minutes Total: Upper Arm (Left) - 23 minutes   DICTATION: .Viviann Spare Dictation  PLAN OF CARE: Discharge to home after PACU  PATIENT DISPOSITION:  PACU - hemodynamically stable.   Delay start of Pharmacological VTE agent (>24hrs) due to surgical blood loss or risk of bleeding: not applicable  Details of procedure Left tennis elbow release and reattachment with tendon debridement  The surgical site was confirmed with the patient and marked in the preop area. Chart review was completed. The patient was taken to the operating room for general anesthesia. The left upper to mid was prepped and draped sterilely. A sterile tourniquet was placed. Timeout procedure was executed.  The limb was exsanguinated with a 4 inch Esmarch and the tourniquet was inflated to 250 mm of mercury.  A lateral incision was made over the lateral epicondyle starting at the level of the radial head and extending 2 cm above the lateral epicondyles. The subcutaneous tissue was divided. The tendon is fascial expansions of the extensor carpi radialis longus and aponeurosis was identified and the division was split longitudinally. The extensor carpi radialis brevis was identified and debrided removing it from  the bone. A small capsulotomy was performed to inspect the joint. 1-1/2 cc of synovial fluid was expressed and appeared abnormal mucin content. The undersurface of the extensor aponeurosis was debrided. 2 drill holes were placed in the lateral epicondyle after debriding it with a rongeur. The tendon was reattached with #1 Vicryl suture. 2-0 Monocryl suture was used to close the skin Steri-Strips were applied. 30 cc of Marcaine was injected around the skin edges.  The tourniquet was released and a sterile bandage was applied     [id number]

## 2014-01-21 NOTE — Interval H&P Note (Signed)
History and Physical Interval Note:  01/21/2014 7:21 AM  Cory [name]  has presented today for surgery, with the diagnosis of Left Tennis Elbow  The various methods of treatment have been discussed with the patient and family. After consideration of risks, benefits and other options for treatment, the patient has consented to  Procedure(s): Tennis Elbow Release with debridement  tendon repair with reattachment (Left) as a surgical intervention .  The patient's history has been reviewed, patient examined, no change in status, stable for surgery.  I have reviewed the patient's chart and labs.  Questions were answered to the patient's satisfaction.     [name]

## 2014-01-24 ENCOUNTER — Ambulatory Visit (INDEPENDENT_AMBULATORY_CARE_PROVIDER_SITE_OTHER): Payer: Self-pay | Admitting: Orthopedic Surgery

## 2014-01-24 ENCOUNTER — Encounter (HOSPITAL_COMMUNITY): Payer: Self-pay | Admitting: Orthopedic Surgery

## 2014-01-24 VITALS — BP 139/91 | Ht 69.0 in | Wt 184.0 lb

## 2014-01-24 DIAGNOSIS — M7712 Lateral epicondylitis, left elbow: Secondary | ICD-10-CM

## 2014-01-24 DIAGNOSIS — M7710 Lateral epicondylitis, unspecified elbow: Secondary | ICD-10-CM

## 2014-01-24 NOTE — Patient Instructions (Signed)
Ok to shower

## 2014-01-24 NOTE — Telephone Encounter (Signed)
01/05/14, 12:20p.m, Regarding out-patient surgery, [facility], CPT code [phone] -- Per [employer] (verified 2015 insurance online); and also, per telephone: spoke with: Hermenia Bers [id number] at [facility] center, [phone], who verified that "only 1 primary insurance can be billed", which is [employer] (not [employer]);  - also verified that no pre-authorization is required for out-patient procedure scheduled 01/21/14.

## 2014-01-24 NOTE — Progress Notes (Signed)
Subjective:     Patient ID: Cory [name], male   DOB: [birth year], 68 y.o.   MRN: [id number] Chief Complaint  Patient presents with  . Follow-up    Post op 1 left elbow DOS 01/21/14    HPI PRE-OPERATIVE DIAGNOSIS: Left Tennis Elbow  POST-OPERATIVE DIAGNOSIS: Left Tennis Elbow  PROCEDURE: Procedure(s):  Left Tennis Elbow Release with debridement tendon repair with reattachment (Left)   Review of Systems     Objective:   Physical Exam Wound clean     Assessment:     Stable post oop      Plan:     Return for wound check

## 2014-02-03 ENCOUNTER — Encounter: Payer: Self-pay | Admitting: Orthopedic Surgery

## 2014-02-03 ENCOUNTER — Ambulatory Visit (INDEPENDENT_AMBULATORY_CARE_PROVIDER_SITE_OTHER): Payer: Self-pay | Admitting: Orthopedic Surgery

## 2014-02-03 VITALS — BP 137/80 | Ht 69.0 in | Wt 184.0 lb

## 2014-02-03 DIAGNOSIS — M7712 Lateral epicondylitis, left elbow: Secondary | ICD-10-CM

## 2014-02-03 DIAGNOSIS — M7710 Lateral epicondylitis, unspecified elbow: Secondary | ICD-10-CM

## 2014-02-03 NOTE — Progress Notes (Signed)
Patient ID: Cory [name], male   DOB: 1946/04/11, 68 y.o.   MRN: [id number] Postop visit  Chief Complaint  Patient presents with  . Follow-up    Post op 2 Left elbow DOS 01/21/14    Doing well wound looks good range of motion is returned to normal limit lifting to 15 pounds or less return 4 weeks for postop visit #2 continue range of motion active exercises

## 2014-02-03 NOTE — Patient Instructions (Signed)
Do not lift over 15 pounds

## 2014-03-03 ENCOUNTER — Ambulatory Visit (INDEPENDENT_AMBULATORY_CARE_PROVIDER_SITE_OTHER): Payer: Self-pay | Admitting: Orthopedic Surgery

## 2014-03-03 ENCOUNTER — Encounter: Payer: Self-pay | Admitting: Orthopedic Surgery

## 2014-03-03 VITALS — BP 119/80 | Ht 69.0 in | Wt 184.0 lb

## 2014-03-03 DIAGNOSIS — M7712 Lateral epicondylitis, left elbow: Secondary | ICD-10-CM

## 2014-03-03 DIAGNOSIS — M7710 Lateral epicondylitis, unspecified elbow: Secondary | ICD-10-CM

## 2014-03-03 NOTE — Patient Instructions (Signed)
Activities as tolerated. 

## 2014-03-03 NOTE — Progress Notes (Signed)
Patient ID: Cory [name], male   DOB: 01/23/1946, 68 y.o.   MRN: [id number]  Chief Complaint  Patient presents with  . Follow-up    6 weeks recheck left elbow post op DOS 01/21/14   tennis elbow release No complaints at this time on looks good I have any difficulties he can resume all normal activities

## 2014-03-21 ENCOUNTER — Encounter: Payer: Self-pay | Admitting: Internal Medicine

## 2014-03-21 ENCOUNTER — Ambulatory Visit (INDEPENDENT_AMBULATORY_CARE_PROVIDER_SITE_OTHER): Payer: Medicare Other | Admitting: Internal Medicine

## 2014-03-21 VITALS — BP 116/72 | HR 68 | Ht 69.75 in | Wt 179.3 lb

## 2014-03-21 DIAGNOSIS — E785 Hyperlipidemia, unspecified: Secondary | ICD-10-CM

## 2014-03-21 DIAGNOSIS — R5381 Other malaise: Secondary | ICD-10-CM

## 2014-03-21 DIAGNOSIS — I208 Other forms of angina pectoris: Secondary | ICD-10-CM

## 2014-03-21 DIAGNOSIS — I2510 Atherosclerotic heart disease of native coronary artery without angina pectoris: Secondary | ICD-10-CM

## 2014-03-21 DIAGNOSIS — I2089 Other forms of angina pectoris: Secondary | ICD-10-CM

## 2014-03-21 DIAGNOSIS — I10 Essential (primary) hypertension: Secondary | ICD-10-CM

## 2014-03-21 DIAGNOSIS — R079 Chest pain, unspecified: Secondary | ICD-10-CM

## 2014-03-21 DIAGNOSIS — R5383 Other fatigue: Secondary | ICD-10-CM

## 2014-03-21 DIAGNOSIS — I209 Angina pectoris, unspecified: Secondary | ICD-10-CM

## 2014-03-21 NOTE — Patient Instructions (Signed)
Your physician has requested that you have en exercise stress myoview. For further information please visit [email or web address]. Please follow instruction sheet, as given.  Your physician wants you to follow-up in: 6 months. You will receive a reminder letter in the mail two months in advance. If you don't receive a letter, please call our office to schedule the follow-up appointment.

## 2014-03-21 NOTE — Progress Notes (Signed)
OFFICE NOTE  Chief Complaint:  Routine follow-up  Primary Care Physician: Purvis Kilts, MD  HPI:  Cory [name] is a 68 year old gentleman, who I have been following for coronary artery disease, status post Taxus stenting in 2002. He also has had some ongoing chronic angina controlled on Imdur 60 mg. He also has PTSD, on a number of medications, but that has been stable, as well as hypertension. Today he has a few different complaints, including some increasing urinary frequency. He does have a history of BPH and is followed at the [place] for this. Sounds like he may have had a TURP procedure recently. He is not on any medications for his prostate. In addition he is describing fatigue. This is a new symptom for him and reports he is sleeping much more regularly and often than he did in the past. He's not exercising as much as he had been before. Of note he is on 3 different antidepressant medications for his PTSD, again managed by the [facility].  He reports fairly stable exertional angina however at times he does get more short of breath with exercise. His last stress test was in 2011.  PMHx:  Past Medical History  Diagnosis Date  . Hypertension   . Hyperlipidemia   . Coronary artery disease     stent to 1st diagonal, mod disease of ramus intermedius in the RCA  . Pneumonia   . PTSD (post-traumatic stress disorder)   . History of nuclear stress test 03/2010    bruce myoview; mild ischemia in basal anterior & mid anterior region; low risk   . Sleep apnea     HX of Sleep apnea, lost weight and 2nd test neg, does not use CPAP now    Past Surgical History  Procedure Laterality Date  . Coronary angioplasty with stent placement  09/2001    Taxus (2.5x63mm) stent to 1st diagonal   . Shoulder surgery    . Knee surgery    . Tonsillectomy    . Colonoscopy  02/25/2012    Procedure: COLONOSCOPY;  Surgeon: Jamesetta So, MD;  [facility];  Service: Gastroenterology;   Laterality: N/A;  . Esophagogastroduodenoscopy  02/25/2012    Procedure: ESOPHAGOGASTRODUODENOSCOPY (EGD);  Surgeon: Jamesetta So, MD;  Location: [facility];  Service: Gastroenterology;  Laterality: N/A;  . Transthoracic echocardiogram  03/2010    EF=>55%; LV mildly dilated; LA mod dilated; mild MR & TR; normal RVSP; mild pulm valve regurg  . Cardiac catheterization  05/17/2010    left main free of significant lesion; LAD with 60% lesion in mid segment & 60-70% lesion in mid-distal segment with subtotal occlusion at apex & small side branch of diagonal 1 that is subtotally occluded; ramus intermedius with 90% ostial stenosis; L Cfx w/mild luminal irreg; RCA is dominant vessel with diffuse disease, 50% prox lesion & 60% lesion in mid segment & 60% lesion in PDA  . Lateral epicondyle release Left 01/21/2014    Procedure: Left Tennis Elbow Release with debridement  tendon repair with reattachment;  Surgeon: Carole Civil, MD;  Location: [facility];  Service: Orthopedics;  Laterality: Left;    FAMHx:  No family history on file.  SOCHx:   reports that he has never smoked. He has never used smokeless tobacco. He reports that he does not drink alcohol or use illicit drugs.  ALLERGIES:  Allergies  Allergen Reactions  . Benadryl [Diphenhydramine Hcl]     Opposite reaction of  medication purpose - hyper  . Benadryl [Diphenhydramine-Zinc Acetate]   . Vioxx [Rofecoxib] Other (See Comments)    headache    ROS: A comprehensive review of systems was negative except for: Respiratory: positive for dyspnea on exertion Cardiovascular: positive for fatigue  HOME MEDS: Current Outpatient Prescriptions  Medication Sig Dispense Refill  . acetaminophen (TYLENOL) 325 MG tablet Take 650 mg by mouth every 6 (six) hours as needed for moderate pain or headache.      Marland Kitchen aspirin EC 81 MG tablet Take 162 mg by mouth at bedtime.        . BUPROPION HBR ER PO Take 50 mg by mouth daily.      . citalopram (CELEXA)  40 MG tablet Take 40 mg by mouth daily.      . clomiPHENE (CLOMID) 50 MG tablet Take 50 mg by mouth 2 (two) times daily.      . diclofenac (VOLTAREN) 75 MG EC tablet Take 75 mg by mouth 2 (two) times daily.        Marland Kitchen gabapentin (NEURONTIN) 300 MG capsule Take 300 mg by mouth 3 (three) times daily.      . isosorbide mononitrate (IMDUR) 60 MG 24 hr tablet Take 60 mg by mouth daily.        Marland Kitchen losartan (COZAAR) 50 MG tablet Take 50 mg by mouth daily.      . niacin (SLO-NIACIN) 500 MG tablet Take 1,000 mg by mouth at bedtime.       Marland Kitchen omeprazole (PRILOSEC) 20 MG capsule Take 20 mg by mouth 2 (two) times daily.        . polysaccharide iron (NIFEREX) 150 MG CAPS capsule Take 150 mg by mouth 2 (two) times daily.       . prazosin (MINIPRESS) 5 MG capsule Take 10 mg by mouth at bedtime.      . simvastatin (ZOCOR) 80 MG tablet Take 40 mg by mouth at bedtime.      . traZODone (DESYREL) 100 MG tablet Take 100 mg by mouth at bedtime.       Marland Kitchen venlafaxine (EFFEXOR) 75 MG tablet Take 75 mg by mouth daily.      . vitamin C (ASCORBIC ACID) 500 MG tablet Take 1,000 mg by mouth at bedtime.      Marland Kitchen HYDROcodone-acetaminophen (NORCO/VICODIN) 5-325 MG per tablet Take 1 tablet by mouth every 4 (four) hours as needed for moderate pain.  30 tablet  0   No current facility-administered medications for this visit.    LABS/IMAGING: No results found for this or any previous visit (from the past 48 hour(s)). No results found.  VITALS: BP 116/72  Pulse 68  Ht 5' 9.75" (1.772 m)  Wt 179 lb 4.8 oz (81.33 kg)  BMI 25.90 kg/m2  EXAM: General appearance: alert and no distress Neck: no carotid bruit and no JVD Lungs: clear to auscultation bilaterally Heart: regular rate and rhythm, S1, S2 normal, no murmur, click, rub or gallop Abdomen: soft, non-tender; bowel sounds normal; no masses,  no organomegaly Extremities: extremities normal, atraumatic, no cyanosis or edema Pulses: 2+ and symmetric Skin: Skin color, texture,  turgor normal. No rashes or lesions Neurologic: Grossly normal Psych: Mood, affect normal  EKG: Normal sinus rhythm at 67  ASSESSMENT: 1. Hypersomnolence/fatigue 2. Coronary artery disease status post Taxus DES to the first diagonal and 2002 3. PTSD 4. Hypertension 5. Dyslipidemia 6. Stable angina on imdur  PLAN: 1.   [name] is describing some progressive fatigue and  hypersomnolence. He has stable angina which could potentially be worsening. His last stress test was in 2011, therefore like to repeat an exercise nuclear stress test. I am also concerned that he may be on too many psychiatric medications. His psychiatrist at the [place] does have a month 3 different medications for his PTSD. I did suspect that this could be sedating. He is also describing some urinary frequency which I will again defer to his urologist. We will contact him with the results of the stress test but otherwise plan to see him back in 6 months.  [name], MD, [facility] Attending Cardiologist [facility]  Zadie Deemer C 03/21/2014, 12:34 PM

## 2014-03-31 ENCOUNTER — Telehealth (HOSPITAL_COMMUNITY): Payer: Self-pay

## 2014-04-05 ENCOUNTER — Ambulatory Visit (HOSPITAL_COMMUNITY)
Admission: RE | Admit: 2014-04-05 | Discharge: 2014-04-05 | Disposition: A | Discharge status: Home / Self Care | Payer: Medicare Other | Source: Ambulatory Visit | Attending: Internal Medicine | Admitting: Internal Medicine

## 2014-04-05 DIAGNOSIS — Z9861 Coronary angioplasty status: Secondary | ICD-10-CM | POA: Insufficient documentation

## 2014-04-05 DIAGNOSIS — R079 Chest pain, unspecified: Secondary | ICD-10-CM

## 2014-04-05 DIAGNOSIS — I10 Essential (primary) hypertension: Secondary | ICD-10-CM | POA: Insufficient documentation

## 2014-04-05 DIAGNOSIS — R5383 Other fatigue: Secondary | ICD-10-CM

## 2014-04-05 DIAGNOSIS — I4949 Other premature depolarization: Secondary | ICD-10-CM | POA: Insufficient documentation

## 2014-04-05 DIAGNOSIS — E663 Overweight: Secondary | ICD-10-CM | POA: Insufficient documentation

## 2014-04-05 DIAGNOSIS — I2510 Atherosclerotic heart disease of native coronary artery without angina pectoris: Secondary | ICD-10-CM | POA: Insufficient documentation

## 2014-04-05 DIAGNOSIS — Z8249 Family history of ischemic heart disease and other diseases of the circulatory system: Secondary | ICD-10-CM | POA: Insufficient documentation

## 2014-04-05 DIAGNOSIS — R0609 Other forms of dyspnea: Secondary | ICD-10-CM | POA: Insufficient documentation

## 2014-04-05 DIAGNOSIS — R0989 Other specified symptoms and signs involving the circulatory and respiratory systems: Secondary | ICD-10-CM | POA: Insufficient documentation

## 2014-04-05 DIAGNOSIS — R5381 Other malaise: Secondary | ICD-10-CM | POA: Insufficient documentation

## 2014-04-05 MED ORDER — TECHNETIUM TC 99M SESTAMIBI GENERIC - CARDIOLITE
30.0000 | Freq: Once | INTRAVENOUS | Status: AC | PRN
  Administered 2014-04-05: 30 via INTRAVENOUS

## 2014-04-05 MED ORDER — TECHNETIUM TC 99M SESTAMIBI GENERIC - CARDIOLITE
10.0000 | Freq: Once | INTRAVENOUS | Status: AC | PRN
  Administered 2014-04-05: 10 via INTRAVENOUS

## 2014-04-05 NOTE — Procedures (Addendum)
[place] [address] [place] [address] [phone]  Cardiology Nuclear Med Study  Cory [name] is a 68 y.o. male     MRN : [id number]     DOB: [birth year]  Procedure Date: 04/05/2014  Nuclear Med Background Indication for Stress Test:  Stent Patency History:  CAD;STENT/PTCA-2002;Last Stress MPI on 04/13/2010-ischemia;EF-not stated Cardiac Risk Factors: Family History - CAD, Hypertension, Lipids and Overweight  Symptoms:  Chest Pain, DOE and Fatigue   Nuclear Pre-Procedure Caffeine/Decaff Intake:  9:30pm NPO After: 7:30am   IV Site: R Forearm  IV 0.9% NS with Angio Cath:  22g  Chest Size (in):  40"  IV Started by: Azucena Cecil, RN  Height: 5' 9.5" (1.765 m)  Cup Size: n/a  BMI:  Body mass index is 26.06 kg/(m^2). Weight:  179 lb (81.194 kg)   Tech Comments:  n/a    Nuclear Med Study 1 or 2 day study: 1 day  Stress Test Type:  Stress  Order Authorizing Provider:  Lyman Bishop, MD   Resting Radionuclide: Technetium 24m Sestamibi  Resting Radionuclide Dose: 10.8 mCi   Stress Radionuclide:  Technetium 51m Sestamibi  Stress Radionuclide Dose: 29.2 mCi           Stress Protocol Rest HR:71 Stress HR:150  Rest BP: 135/95 Stress BP: 157/77  Exercise Time (min): 11:20 METS: 13.40          Dose of Adenosine (mg):  n/a Dose of Lexiscan: n/a mg  Dose of Atropine (mg): n/a Dose of Dobutamine: n/a mcg/kg/min (at max HR)  Stress Test Technologist: Mellody Memos, CCT Nuclear Technologist: Otho Perl, CNMT   Rest Procedure:  Myocardial perfusion imaging was performed at rest 45 minutes following the intravenous administration of Technetium 1m Sestamibi. Stress Procedure:  The patient performed treadmill exercise using a Bruce  Protocol for 11 minutes and 20 seconds. The patient stopped due to generalized fatigue.  Patient denied any chest pain.  There were no significant ST-T wave changes.  Technetium 2m  Sestamibi was injected IV at peak exercise and myocardial perfusion imaging was performed after a brief delay.  Transient Ischemic Dilatation (Normal <1.22):  0.81 Lung/Heart Ratio (Normal <0.45):  0.31 QGS EDV:  95 ml QGS ESV:  44 ml LV Ejection Fraction: 54%  Rest ECG: NSR - Normal EKG  Stress ECG: No significant ST segment change suggestive of ischemia.  QPS Raw Data Images:  Normal; no motion artifact; normal heart/lung ratio. Stress Images:  Inferoseptal defect Rest Images:  Inferoseptal defect Subtraction (SDS):  No evidence of ischemia.  Impression Exercise Capacity:  Excellent exercise capacity. BP Response:  Normal blood pressure response. Clinical Symptoms:  No significant symptoms noted. ECG Impression:  No significant ST segment change suggestive of ischemia. Scattered PVC's. Comparison with Prior Nuclear Study: Prior study showed basal to mid anterior ischemia.  Overall Impression:  Low risk stress nuclear study with fixed inferoseptal attenuation artifact.  LV Wall Motion:  NL LV Function; NL Wall Motion; EF 54%.  [name], MD, [facility] Board Certified in Nuclear Cardiology Attending Cardiologist [place], MD  04/05/2014 1:26 PM

## 2014-06-30 NOTE — Telephone Encounter (Signed)
Encounter complete. 

## 2014-07-22 ENCOUNTER — Telehealth: Payer: Self-pay | Admitting: Internal Medicine

## 2014-07-22 NOTE — Telephone Encounter (Signed)
Closed encounter °

## 2014-07-25 ENCOUNTER — Other Ambulatory Visit (INDEPENDENT_AMBULATORY_CARE_PROVIDER_SITE_OTHER): Payer: Self-pay | Admitting: *Deleted

## 2014-07-25 ENCOUNTER — Encounter (INDEPENDENT_AMBULATORY_CARE_PROVIDER_SITE_OTHER): Payer: Self-pay | Admitting: Internal Medicine

## 2014-07-25 ENCOUNTER — Ambulatory Visit (INDEPENDENT_AMBULATORY_CARE_PROVIDER_SITE_OTHER): Payer: Medicare Other | Admitting: Internal Medicine

## 2014-07-25 ENCOUNTER — Ambulatory Visit (HOSPITAL_COMMUNITY): Payer: Medicare Other | Admitting: Physical Therapy

## 2014-07-25 ENCOUNTER — Encounter (INDEPENDENT_AMBULATORY_CARE_PROVIDER_SITE_OTHER): Payer: Self-pay | Admitting: *Deleted

## 2014-07-25 VITALS — BP 164/62 | HR 60 | Temp 97.9°F | Ht 70.0 in | Wt 179.9 lb

## 2014-07-25 DIAGNOSIS — D649 Anemia, unspecified: Secondary | ICD-10-CM

## 2014-07-25 DIAGNOSIS — K921 Melena: Secondary | ICD-10-CM

## 2014-07-25 LAB — CBC WITH DIFFERENTIAL/PLATELET
Basophils Absolute: 0.1 10*3/uL (ref 0.0–0.1)
Basophils Relative: 1 % (ref 0–1)
Eosinophils Absolute: 0.2 10*3/uL (ref 0.0–0.7)
Eosinophils Relative: 4 % (ref 0–5)
HCT: 40.7 % (ref 39.0–52.0)
Hemoglobin: 13.9 g/dL (ref 13.0–17.0)
Lymphocytes Relative: 24 % (ref 12–46)
Lymphs Abs: 1.3 10*3/uL (ref 0.7–4.0)
MCH: 29.6 pg (ref 26.0–34.0)
MCHC: 34.2 g/dL (ref 30.0–36.0)
MCV: 86.8 fL (ref 78.0–100.0)
Monocytes Absolute: 0.4 10*3/uL (ref 0.1–1.0)
Monocytes Relative: 8 % (ref 3–12)
Neutro Abs: 3.5 10*3/uL (ref 1.7–7.7)
Neutrophils Relative %: 63 % (ref 43–77)
Platelets: 167 10*3/uL (ref 150–400)
RBC: 4.69 MIL/uL (ref 4.22–5.81)
RDW: 13.8 % (ref 11.5–15.5)
WBC: 5.6 10*3/uL (ref 4.0–10.5)

## 2014-07-25 LAB — IRON AND TIBC
%SAT: 48 % (ref 20–55)
Iron: 155 ug/dL (ref 42–165)
TIBC: 321 ug/dL (ref 215–435)
UIBC: 166 ug/dL (ref 125–400)

## 2014-07-25 NOTE — Progress Notes (Signed)
Subjective:     Patient ID: Cory [name], male   DOB: 15-Dec-1946, 68 y.o.   MRN: [id number]  HPI Referred to our office by Dr. Jomarie Longs for melena. Stool card was positive. Patient states his stools are black. Stools have been black x 3 weeks. Appetite is okay. There has been no weight loss. No abdominal pain. Usually has a BM every other day or every 2 days.  Acid reflux controlled with Omeprazole.  Has been on iron '325mg'$  bid x  5 weeks. Has been off Diclofenac '75mg'$  BID x 2 weeks.  In 2013 underwent an EGD/Colonoscopy for iron def. Anemia; Dr. Arnoldo Morale EGD: Normal. Colonoscopy: Severe diverticulosis. Otherwise normal.   Review of Systems Past Medical History  Diagnosis Date  . Hypertension   . Hyperlipidemia   . Coronary artery disease     stent to 1st diagonal, mod disease of ramus intermedius in the RCA  . Pneumonia   . PTSD (post-traumatic stress disorder)   . History of nuclear stress test 03/2010    bruce myoview; mild ischemia in basal anterior & mid anterior region; low risk     Past Surgical History  Procedure Laterality Date  . Coronary angioplasty with stent placement  09/2001    Taxus (2.5x70m) stent to 1st diagonal   . Shoulder surgery    . Knee surgery    . Tonsillectomy    . Colonoscopy  02/25/2012    Procedure: COLONOSCOPY;  Surgeon: MJamesetta So MD;  [facility];  Service: Gastroenterology;  Laterality: N/A;  . Esophagogastroduodenoscopy  02/25/2012    Procedure: ESOPHAGOGASTRODUODENOSCOPY (EGD);  Surgeon: MJamesetta So MD;  [facility];  Service: Gastroenterology;  Laterality: N/A;  . Transthoracic echocardiogram  03/2010    EF=>55%; LV mildly dilated; LA mod dilated; mild MR & TR; normal RVSP; mild pulm valve regurg  . Cardiac catheterization  05/17/2010    left main free of significant lesion; LAD with 60% lesion in mid segment & 60-70% lesion in mid-distal segment with subtotal occlusion at apex & small side branch of diagonal 1  that is subtotally occluded; ramus intermedius with 90% ostial stenosis; L Cfx w/mild luminal irreg; RCA is dominant vessel with diffuse disease, 50% prox lesion & 60% lesion in mid segment & 60% lesion in PDA  . Lateral epicondyle release Left 01/21/2014    Procedure: Left Tennis Elbow Release with debridement  tendon repair with reattachment;  Surgeon: SCarole Civil MD;  Location: [facility];  Service: Orthopedics;  Laterality: Left;    Allergies  Allergen Reactions  . Benadryl [Diphenhydramine Hcl]     Opposite reaction of medication purpose - hyper  . Benadryl [Diphenhydramine-Zinc Acetate]   . Vioxx [Rofecoxib] Other (See Comments)    headache    Current Outpatient Prescriptions on File Prior to Visit  Medication Sig Dispense Refill  . acetaminophen (TYLENOL) 325 MG tablet Take 650 mg by mouth every 6 (six) hours as needed for moderate pain or headache.      .Marland Kitchenaspirin EC 81 MG tablet Take 162 mg by mouth at bedtime.        . BUPROPION HBR ER PO Take 50 mg by mouth daily.      . clomiPHENE (CLOMID) 50 MG tablet Take 50 mg by mouth 2 (two) times daily.      .Marland Kitchengabapentin (NEURONTIN) 300 MG capsule Take 300 mg by mouth 3 (three) times daily.      . isosorbide mononitrate (IMDUR) 60  MG 24 hr tablet Take 60 mg by mouth daily.        Marland Kitchen losartan (COZAAR) 50 MG tablet Take 50 mg by mouth daily.      Marland Kitchen omeprazole (PRILOSEC) 20 MG capsule Take 20 mg by mouth 2 (two) times daily.        . prazosin (MINIPRESS) 5 MG capsule Take 10 mg by mouth at bedtime.      . simvastatin (ZOCOR) 80 MG tablet Take 40 mg by mouth at bedtime.      . traZODone (DESYREL) 100 MG tablet Take 100 mg by mouth at bedtime.       Marland Kitchen venlafaxine (EFFEXOR) 75 MG tablet Take 75 mg by mouth daily.      . vitamin C (ASCORBIC ACID) 500 MG tablet Take 1,000 mg by mouth at bedtime.       No current facility-administered medications on file prior to visit.        Objective:   Physical Exam  Filed Vitals:   07/25/14 1101   BP: 164/62  Pulse: 60  Temp: 97.9 F (36.6 C)  Height: '5\' 10"'$  (1.778 m)  Weight: 179 lb 14.4 oz (81.602 kg)   Alert and oriented. Skin warm and dry. Oral mucosa is moist.   . Sclera anicteric, conjunctivae is pink. Thyroid not enlarged. No cervical lymphadenopathy. Lungs clear. Heart regular rate and rhythm.  Abdomen is soft. Bowel sounds are positive. No hepatomegaly. No abdominal masses felt. No tenderness.  No edema to lower extremites.  Stool black and guaiac negative.    Assessment:    Melena. PUD needs to be ruled out.  I discussed with Dr. Laural Golden. Las EGD/Colonoscopy 2 yrs ago normal.     Plan:    CBC today, iron studies. EGD.

## 2014-07-25 NOTE — Patient Instructions (Signed)
EGD. The risks and benefits such as perforation, bleeding, and infection were reviewed with the patient and is agreeable. 

## 2014-07-26 LAB — FERRITIN: Ferritin: 46 ng/mL (ref 22–322)

## 2014-07-28 ENCOUNTER — Ambulatory Visit (HOSPITAL_COMMUNITY)
Admission: RE | Admit: 2014-07-28 | Discharge: 2014-07-28 | Disposition: A | Discharge status: Home / Self Care | Payer: Medicare Other | Source: Ambulatory Visit | Attending: Internal Medicine | Admitting: Internal Medicine

## 2014-07-28 ENCOUNTER — Encounter (HOSPITAL_COMMUNITY)
Admission: RE | Disposition: A | Discharge status: Home / Self Care | Payer: Self-pay | Source: Ambulatory Visit | Attending: Internal Medicine

## 2014-07-28 DIAGNOSIS — A048 Other specified bacterial intestinal infections: Secondary | ICD-10-CM | POA: Insufficient documentation

## 2014-07-28 DIAGNOSIS — Z9861 Coronary angioplasty status: Secondary | ICD-10-CM | POA: Insufficient documentation

## 2014-07-28 DIAGNOSIS — K921 Melena: Secondary | ICD-10-CM | POA: Insufficient documentation

## 2014-07-28 DIAGNOSIS — K2940 Chronic atrophic gastritis without bleeding: Secondary | ICD-10-CM | POA: Diagnosis not present

## 2014-07-28 DIAGNOSIS — Z7982 Long term (current) use of aspirin: Secondary | ICD-10-CM | POA: Diagnosis not present

## 2014-07-28 DIAGNOSIS — I2510 Atherosclerotic heart disease of native coronary artery without angina pectoris: Secondary | ICD-10-CM | POA: Diagnosis not present

## 2014-07-28 DIAGNOSIS — I10 Essential (primary) hypertension: Secondary | ICD-10-CM | POA: Insufficient documentation

## 2014-07-28 DIAGNOSIS — K219 Gastro-esophageal reflux disease without esophagitis: Secondary | ICD-10-CM | POA: Insufficient documentation

## 2014-07-28 DIAGNOSIS — Z79899 Other long term (current) drug therapy: Secondary | ICD-10-CM | POA: Insufficient documentation

## 2014-07-28 DIAGNOSIS — E785 Hyperlipidemia, unspecified: Secondary | ICD-10-CM | POA: Insufficient documentation

## 2014-07-28 DIAGNOSIS — Z8719 Personal history of other diseases of the digestive system: Secondary | ICD-10-CM

## 2014-07-28 DIAGNOSIS — K449 Diaphragmatic hernia without obstruction or gangrene: Secondary | ICD-10-CM

## 2014-07-28 DIAGNOSIS — K2270 Barrett's esophagus without dysplasia: Secondary | ICD-10-CM | POA: Insufficient documentation

## 2014-07-28 HISTORY — PX: ESOPHAGOGASTRODUODENOSCOPY: SHX5428

## 2014-07-28 SURGERY — EGD (ESOPHAGOGASTRODUODENOSCOPY)
Anesthesia: Moderate Sedation

## 2014-07-28 MED ORDER — MIDAZOLAM HCL 5 MG/5ML IJ SOLN
INTRAMUSCULAR | Status: AC
  Filled 2014-07-28: qty 10

## 2014-07-28 MED ORDER — SIMETHICONE 40 MG/0.6ML PO SUSP
ORAL | Status: DC | PRN
  Administered 2014-07-28: 14:00:00

## 2014-07-28 MED ORDER — MIDAZOLAM HCL 5 MG/5ML IJ SOLN
INTRAMUSCULAR | Status: DC | PRN
  Administered 2014-07-28: 2 mg via INTRAVENOUS
  Administered 2014-07-28: 1 mg via INTRAVENOUS
  Administered 2014-07-28: 2 mg via INTRAVENOUS

## 2014-07-28 MED ORDER — MEPERIDINE HCL 50 MG/ML IJ SOLN
INTRAMUSCULAR | Status: DC | PRN
  Administered 2014-07-28 (×2): 25 mg via INTRAVENOUS

## 2014-07-28 MED ORDER — BUTAMBEN-TETRACAINE-BENZOCAINE 2-2-14 % EX AERO
INHALATION_SPRAY | CUTANEOUS | Status: DC | PRN
  Administered 2014-07-28: 2 via TOPICAL

## 2014-07-28 MED ORDER — SODIUM CHLORIDE 0.9 % IV SOLN
INTRAVENOUS | Status: DC
  Administered 2014-07-28: 14:00:00 via INTRAVENOUS

## 2014-07-28 MED ORDER — MEPERIDINE HCL 50 MG/ML IJ SOLN
INTRAMUSCULAR | Status: AC
  Filled 2014-07-28: qty 1

## 2014-07-28 NOTE — H&P (Signed)
Cory [name] is an 68 y.o. male.   Chief Complaint: Patient is here for EGD. HPI: Patient is 68 year old Caucasian male who presents with history of melena which lasted for about 3 weeks. He did not experience nausea vomiting abdominal pain or postural symptoms. Melena after he stopped Naprosyn. He was also noted to have heme positive stool. Patient was seen in the office 3 days ago. Stool was guaiac negative and H&H was normal. Iron studies are also normal . He has chronic GERD complicated by short segment Barrett's esophagus which was last biopsied in 2007. He had EGD and colonoscopy by Dr. Arnoldo [name] in 2013 for iron deficiency anemia. EGD was reported to be normal the colonoscopy revealed pancolonic diverticulosis. CLOtest was negative.  Past Medical History  Diagnosis Date  . Hypertension   . Hyperlipidemia   . Coronary artery disease     stent to 1st diagonal, mod disease of ramus intermedius in the RCA  . Pneumonia   . PTSD (post-traumatic stress disorder)   . History of nuclear stress test 03/2010    bruce myoview; mild ischemia in basal anterior & mid anterior region; low risk     Past Surgical History  Procedure Laterality Date  . Coronary angioplasty with stent placement  09/2001    Taxus (2.5x65m) stent to 1st diagonal   . Shoulder surgery    . Knee surgery    . Tonsillectomy    . Colonoscopy  02/25/2012    Procedure: COLONOSCOPY;  Surgeon: Cory So [name];  [facility];  Service: Gastroenterology;  Laterality: N/A;  . Esophagogastroduodenoscopy  02/25/2012    Procedure: ESOPHAGOGASTRODUODENOSCOPY (EGD);  Surgeon: Cory So [name];  [facility]: [facility];  Service: Gastroenterology;  Laterality: N/A;  . Transthoracic echocardiogram  03/2010    EF=>55%; LV mildly dilated; LA mod dilated; mild MR & TR; normal RVSP; mild pulm valve regurg  . Cardiac catheterization  05/17/2010    left main free of significant lesion; LAD with 60% lesion in mid segment & 60-70%  lesion in mid-distal segment with subtotal occlusion at apex & small side branch of diagonal 1 that is subtotally occluded; ramus intermedius with 90% ostial stenosis; L Cfx w/mild luminal irreg; RCA is dominant vessel with diffuse disease, 50% prox lesion & 60% lesion in mid segment & 60% lesion in PDA  . Lateral epicondyle release Left 01/21/2014    Procedure: Left Tennis Elbow Release with debridement  tendon repair with reattachment;  Surgeon: Cory Civil [name];  Location: [facility];  Service: Orthopedics;  Laterality: Left;    No family history on file. Social History:  reports that he has never smoked. He has never used smokeless tobacco. He reports that he does not drink alcohol or use illicit drugs.  Allergies:  Allergies  Allergen Reactions  . Benadryl [Diphenhydramine Hcl]     Opposite reaction of medication purpose - hyper  . Benadryl [Diphenhydramine-Zinc Acetate]   . Vioxx [Rofecoxib] Other (See Comments)    headache    Medications Prior to Admission  Medication Sig Dispense Refill  . acetaminophen (TYLENOL) 325 MG tablet Take 650 mg by mouth every 6 (six) hours as needed for moderate pain or headache.      .Marland Kitchenaspirin EC 81 MG tablet Take 162 mg by mouth at bedtime.        . B Complex Vitamins (B-COMPLEX/B-12 PO) Take by mouth 1 day or 1 dose.      . BUPROPION HBR ER PO  Take 50 mg by mouth daily.      . clomiPHENE (CLOMID) 50 MG tablet Take 50 mg by mouth 2 (two) times daily.      . ferrous sulfate 325 (65 FE) MG tablet Take 325 mg by mouth 2 (two) times daily before a meal.      . gabapentin (NEURONTIN) 300 MG capsule Take 300 mg by mouth 3 (three) times daily.      . isosorbide mononitrate (IMDUR) 60 MG 24 hr tablet Take 60 mg by mouth daily.        Marland Kitchen losartan (COZAAR) 50 MG tablet Take 50 mg by mouth daily.      . Omega-3 Fatty Acids (FISH OIL) 1000 MG CAPS Take by mouth.      Marland Kitchen omeprazole (PRILOSEC) 20 MG capsule Take 20 mg by mouth 2 (two) times daily.        .  prazosin (MINIPRESS) 5 MG capsule Take 10 mg by mouth at bedtime.      . simvastatin (ZOCOR) 80 MG tablet Take 40 mg by mouth at bedtime.      . traZODone (DESYREL) 100 MG tablet Take 100 mg by mouth at bedtime.       Marland Kitchen venlafaxine (EFFEXOR) 75 MG tablet Take 75 mg by mouth daily.      . vitamin C (ASCORBIC ACID) 500 MG tablet Take 1,000 mg by mouth at bedtime.        No results found for this or any previous visit (from the past 48 hour(s)). No results found.  ROS  Blood pressure 144/73, pulse 44, temperature 97.6 F (36.4 C), temperature source Oral, resp. rate 22, SpO2 98.00%. Physical Exam   Assessment/Plan History of melena. Chronic GERD complicated by short segment Barrett's esophagus. EGD.  Cory [name] 07/28/2014, 2:34 PM

## 2014-07-28 NOTE — Discharge Instructions (Signed)
Discontinue ferrous sulfate and resume other medications as before. Resume usual diet. No driving for 24 hours. Physician will call with biopsy results.  Hiatal Hernia A hiatal hernia occurs when part of your stomach slides above the muscle that separates your abdomen from your chest (diaphragm). You can be born with a hiatal hernia (congenital), or it may develop over time. In almost all cases of hiatal hernia, only the top part of the stomach pushes through.  Many people have a hiatal hernia with no symptoms. The larger the hernia, the more likely that you will have symptoms. In some cases, a hiatal hernia allows stomach acid to flow back into the tube that carries food from your mouth to your stomach (esophagus). This may cause heartburn symptoms. Severe heartburn symptoms may mean you have developed a condition called gastroesophageal reflux disease (GERD).  CAUSES  Hiatal hernias are caused by a weakness in the opening (hiatus) where your esophagus passes through your diaphragm to attach to the upper part of your stomach. You may be born with a weakness in your hiatus, or a weakness can develop. RISK FACTORS Older age is a major risk factor for a hiatal hernia. Anything that increases pressure on your diaphragm can also increase your risk of a hiatal hernia. This includes:  Pregnancy.  Excess weight.  Frequent constipation. SIGNS AND SYMPTOMS  People with a hiatal hernia often have no symptoms. If symptoms develop, they are almost always caused by GERD. They may include:  Heartburn.  Belching.  Indigestion.  Trouble swallowing.  Coughing or wheezing.  Sore throat.  Hoarseness.  Chest pain. DIAGNOSIS  A hiatal hernia is sometimes found during an exam for another problem. Your health care provider may suspect a hiatal hernia if you have symptoms of GERD. Tests may be done to diagnose GERD. These may include:  X-rays of your stomach or chest.  An upper gastrointestinal  (GI) series. This is an X-ray exam of your GI tract involving the use of a chalky liquid that you swallow. The liquid shows up clearly on the X-ray.  Endoscopy. This is a procedure to look into your stomach using a thin, flexible tube that has a tiny camera and light on the end of it. TREATMENT  If you have no symptoms, you may not need treatment. If you have symptoms, treatment may include:  Dietary and lifestyle changes to help reduce GERD symptoms.  Medicines. These may include:  Over-the-counter antacids.  Medicines that make your stomach empty more quickly.  Medicines that block the production of stomach acid (H2 blockers).  Stronger medicines to reduce stomach acid (proton pump inhibitors).  You may need surgery to repair the hernia if other treatments are not helping. HOME CARE INSTRUCTIONS   Take all medicines as directed by your health care provider.  Quit smoking, if you smoke.  Try to achieve and maintain a healthy body weight.  Eat frequent small meals instead of three large meals a day. This keeps your stomach from getting too full.  Eat slowly.  Do not lie down right after eating.  Do noteat 1-2 hours before bed.   Do not drink beverages with caffeine. These include cola, coffee, cocoa, and tea.  Do not drink alcohol.  Avoid foods that can make symptoms of GERD worse. These may include:  Fatty foods.  Citrus fruits.  Other foods and drinks that contain acid.  Avoid putting pressure on your belly. Anything that puts pressure on your belly increases the amount of  acid that may be pushed up into your esophagus.   Avoid bending over, especially after eating.  Raise the head of your bed by putting blocks under the legs. This keeps your head and esophagus higher than your stomach.  Do not wear tight clothing around your chest or stomach.  Try not to strain when having a bowel movement, when urinating, or when lifting heavy objects. SEEK MEDICAL CARE  IF:  Your symptoms are not controlled with medicines or lifestyle changes.  You are having trouble swallowing.  You have coughing or wheezing that will not go away. SEEK IMMEDIATE MEDICAL CARE IF:  Your pain is getting worse.  Your pain spreads to your arms, neck, jaw, teeth, or back.  You have shortness of breath.  You sweat for no reason.  You feel sick to your stomach (nauseous) or vomit.  You vomit blood.  You have bright red blood in your stools.  You have black, tarry stools.  Document Released: 03/07/2004 Document Revised: 05/02/2014 Document Reviewed: 12/03/2013 [facility] Patient Information 2015 [place], [place]. This information is not intended to replace advice given to you by your health care provider. Make sure you discuss any questions you have with your health care provider. Barrett's Esophagus Barrett's esophagus occurs when the lining of the esophagus is damaged. The esophagus is the tube that carries food from the mouth to the stomach. With Barrett's esophagus, the lining of the esophagus gets replaced by material that is similar to the lining in the intestines. This process is called intestinal metaplasia. A small number of people with Barrett's esophagus develop esophageal cancer. CAUSES  The exact cause of Barrett's esophagus is unknown. SYMPTOMS  Most people with Barrett's esophagus do not have symptoms. However, many patients also have gastroesophageal reflux disease (GERD). GERD can cause heartburn, trouble swallowing, and a dry cough. DIAGNOSIS Barrett's esophagus is diagnosed by an exam called upper gastrointestinal endoscopy. A thin, flexible tube (endoscope) is passed down the esophagus. The endoscope has a light and camera on the end. Your caregiver uses the endoscope to view the inside of the esophagus. A tissue sample may also be taken and examined under a microscope (biopsy). If cancer cells are found during the biopsy, this condition is called  dysplasia. TREATMENT  If you have no dysplasia or low-grade dysplasia, your caregiver may recommend no treatment or only taking medicines to treat GERD. Sometimes, taking acid-blocking drugs to treat GERD helps improve the tissue affected by Barrett's esophagus. Your caregiver may also recommend periodic esophageal exams. If you have high-grade dysplasia, treatment may include removing the damaged parts of the esophagus. This can be done by heating, freezing, or surgically removing the tissue. In some cases, surgery may be done to remove most of the esophagus. The stomach is then attached to the remaining portion of the esophagus. HOME CARE INSTRUCTIONS  Take acid-blocking drugs for GERD if recommended by your caregiver.  Keep all follow-up appointments as directed by your caregiver. You may need periodic esophageal exams. SEEK IMMEDIATE MEDICAL CARE IF:  You have chest pain.  You have trouble swallowing.  You vomit blood or material that looks like coffee grounds.  Your stools are bright red or dark. Document Released: 03/07/2004 Document Revised: 06/16/2012 Document Reviewed: 02/25/2012 [facility] Patient Information 2015 [place], [place]. This information is not intended to replace advice given to you by your health care provider. Make sure you discuss any questions you have with your health care provider.

## 2014-07-28 NOTE — Op Note (Signed)
EGD PROCEDURE REPORT  PATIENT:  Cory [name]  MR#:  [id number] Birthdate:  11-23-1946, 68 y.o., male Endoscopist:  Dr. Rogene Houston, MD Referred By:  Dr. Purvis Kilts, MD  Procedure Date: 07/28/2014  Procedure:   EGD  Indications:  Patient is 37 old Caucasian male who presented with history of melena. He has history of GERD complicated by short segment Barrett's esophagus. Melena. After he stopped Naprosyn. He BC and iron studies from 3 days ago are within normal limits.            Informed Consent:  The risks, benefits, alternatives & imponderables which include, but are not limited to, bleeding, infection, perforation, drug reaction and potential missed lesion have been reviewed.  The potential for biopsy, lesion removal, esophageal dilation, etc. have also been discussed.  Questions have been answered.  All parties agreeable.  Please see history & physical in medical record for more information.  Medications:  Demerol 50 mg IV Versed 5 mg IV Cetacaine spray topically for oropharyngeal anesthesia  Description of procedure:  The endoscope was introduced through the mouth and advanced to the second portion of the duodenum without difficulty or limitations. The mucosal surfaces were surveyed very carefully during advancement of the scope and upon withdrawal.  Findings:  Esophagus:  Mucosa of the proximal and middle segments was normal. Salmon-colored mucosa noted at distal esophagus with maximal length of 2 cm on the right side. There were smaller island on the left side. GEJ:  39 cm Hiatus:  41 cm Stomach:  Stomach was empty and distended very well with insufflation. Folds in the proximal stomach were normal. Examination of mucosa at gastric body, antrum, pyloric channel, angularis fundus and cardia was normal. Duodenum:  Normal bulbar and post bulbar mucosa.  Therapeutic/Diagnostic Maneuvers Performed:   Multiple biopsies taken from [facility] mucosa for routine  histology.  Complications:  None  Impression: Short segment Barrett's esophagus geographic pattern and maximal length of 2 cm. Small sliding hiatal hernia. No evidence of peptic ulcer disease.  Recommendations:  No further workup unless melena recurs in which case he would need small bowel given capsule study. Patient can stop ferrous sulfate since H&H and iron studies are within normal limits. I will be contacting pressure and biopsy results.  Shahed Yeoman U  07/28/2014  3:03 PM  CC: Dr. Hilma Favors, Betsy Coder, MD & Dr. Rayne Du ref. provider found

## 2014-07-29 ENCOUNTER — Encounter (HOSPITAL_COMMUNITY): Payer: Self-pay | Admitting: Internal Medicine

## 2014-08-08 ENCOUNTER — Telehealth (INDEPENDENT_AMBULATORY_CARE_PROVIDER_SITE_OTHER): Payer: Self-pay | Admitting: *Deleted

## 2014-08-08 DIAGNOSIS — A048 Other specified bacterial intestinal infections: Secondary | ICD-10-CM

## 2014-08-08 NOTE — Telephone Encounter (Signed)
Per Dr.Rehman the patient will need to have labs drawn. Coming by to pick up paper work 08/09/14.

## 2014-08-10 ENCOUNTER — Telehealth (INDEPENDENT_AMBULATORY_CARE_PROVIDER_SITE_OTHER): Payer: Self-pay | Admitting: *Deleted

## 2014-08-10 DIAGNOSIS — A048 Other specified bacterial intestinal infections: Secondary | ICD-10-CM

## 2014-08-10 NOTE — Telephone Encounter (Signed)
Per Dr.Rehman the patient will need to have labs drawn. 

## 2014-08-12 LAB — H. PYLORI ANTIBODY, IGG: H Pylori IgG: 0.52 {ISR}

## 2014-08-16 ENCOUNTER — Encounter (INDEPENDENT_AMBULATORY_CARE_PROVIDER_SITE_OTHER): Payer: Self-pay | Admitting: *Deleted

## 2014-09-06 NOTE — Telephone Encounter (Signed)
1 year f/u has been noted on the patient's recall list

## 2014-10-27 ENCOUNTER — Ambulatory Visit (INDEPENDENT_AMBULATORY_CARE_PROVIDER_SITE_OTHER): Payer: Medicare Other | Admitting: Internal Medicine

## 2014-10-27 ENCOUNTER — Encounter: Payer: Self-pay | Admitting: Internal Medicine

## 2014-10-27 VITALS — BP 130/88 | HR 68 | Ht 69.0 in | Wt 186.4 lb

## 2014-10-27 DIAGNOSIS — I10 Essential (primary) hypertension: Secondary | ICD-10-CM

## 2014-10-27 DIAGNOSIS — I2583 Coronary atherosclerosis due to lipid rich plaque: Secondary | ICD-10-CM

## 2014-10-27 DIAGNOSIS — E785 Hyperlipidemia, unspecified: Secondary | ICD-10-CM

## 2014-10-27 DIAGNOSIS — I208 Other forms of angina pectoris: Secondary | ICD-10-CM

## 2014-10-27 DIAGNOSIS — I2089 Other forms of angina pectoris: Secondary | ICD-10-CM

## 2014-10-27 DIAGNOSIS — I2510 Atherosclerotic heart disease of native coronary artery without angina pectoris: Secondary | ICD-10-CM

## 2014-10-27 NOTE — Patient Instructions (Signed)
Decrease isosorbide to 30 mg (1/2 tablet daily) -- for 2 weeks, if no chest pain, then can consider stopping it.   Follow-up in 1 year.  Dr. Debara Pickett

## 2014-10-28 ENCOUNTER — Encounter: Payer: Self-pay | Admitting: Internal Medicine

## 2014-10-28 NOTE — Progress Notes (Signed)
OFFICE NOTE  Chief Complaint:  Routine follow-up  Primary Care Physician: Purvis Kilts, MD  HPI:  Cory [name] is a 68 year old gentleman, who I have been following for coronary artery disease, status post Taxus stenting in 2002. He also has had some ongoing chronic angina controlled on Imdur 60 mg. He also has PTSD, on a number of medications, but that has been stable, as well as hypertension. Today he has a few different complaints, including some increasing urinary frequency. He does have a history of BPH and is followed at the [place] for this. Sounds like he may have had a TURP procedure recently. He is not on any medications for his prostate. In addition he is describing fatigue. This is a new symptom for him and reports he is sleeping much more regularly and often than he did in the past. He's not exercising as much as he had been before. Of note he is on 3 different antidepressant medications for his PTSD, again managed by the [facility].  He reports fairly stable exertional angina however at times he does get more short of breath with exercise. His last stress test was in 2011.  Cory [name] returns today for follow-up. He is without complaint. His blood pressure appears to be well-controlled. He denies any chest pain and is actually had very little need to take any short acting nitroglycerin since he's been on Imdur. He remains active and has no shortness of breath.  PMHx:  Past Medical History  Diagnosis Date  . Hypertension   . Hyperlipidemia   . Coronary artery disease     stent to 1st diagonal, mod disease of ramus intermedius in the RCA  . Pneumonia   . PTSD (post-traumatic stress disorder)   . History of nuclear stress test 03/2010    bruce myoview; mild ischemia in basal anterior & mid anterior region; low risk     Past Surgical History  Procedure Laterality Date  . Coronary angioplasty with stent placement  09/2001    Taxus (2.5x50m) stent to 1st diagonal     . Shoulder surgery    . Knee surgery    . Tonsillectomy    . Colonoscopy  02/25/2012    Procedure: COLONOSCOPY;  Surgeon: MJamesetta So MD;  [facility];  Service: Gastroenterology;  Laterality: N/A;  . Esophagogastroduodenoscopy  02/25/2012    Procedure: ESOPHAGOGASTRODUODENOSCOPY (EGD);  Surgeon: MJamesetta So MD;  [facility];  Service: Gastroenterology;  Laterality: N/A;  . Transthoracic echocardiogram  03/2010    EF=>55%; LV mildly dilated; LA mod dilated; mild MR & TR; normal RVSP; mild pulm valve regurg  . Cardiac catheterization  05/17/2010    left main free of significant lesion; LAD with 60% lesion in mid segment & 60-70% lesion in mid-distal segment with subtotal occlusion at apex & small side branch of diagonal 1 that is subtotally occluded; ramus intermedius with 90% ostial stenosis; L Cfx w/mild luminal irreg; RCA is dominant vessel with diffuse disease, 50% prox lesion & 60% lesion in mid segment & 60% lesion in PDA  . Lateral epicondyle release Left 01/21/2014    Procedure: Left Tennis Elbow Release with debridement  tendon repair with reattachment;  Surgeon: SCarole Civil MD;  Location: [facility];  Service: Orthopedics;  Laterality: Left;  . Esophagogastroduodenoscopy N/A 07/28/2014    Procedure: ESOPHAGOGASTRODUODENOSCOPY (EGD);  Surgeon: NRogene Houston MD;  [facility];  Service: Endoscopy;  Laterality: N/A;  200-rescheduled to [place] notified pt    FAMHx:  History reviewed. No pertinent family history.  SOCHx:   reports that he has never smoked. He has never used smokeless tobacco. He reports that he does not drink alcohol or use illicit drugs.  ALLERGIES:  Allergies  Allergen Reactions  . Ace Inhibitors   . Benadryl [Diphenhydramine Hcl]     Opposite reaction of medication purpose - hyper  . Benadryl [Diphenhydramine-Zinc Acetate]   . Vioxx [Rofecoxib] Other (See Comments)    headache    ROS: A comprehensive review of  systems was negative except for: Respiratory: positive for dyspnea on exertion Cardiovascular: positive for fatigue  HOME MEDS: Current Outpatient Prescriptions  Medication Sig Dispense Refill  . acetaminophen (TYLENOL) 325 MG tablet Take 650 mg by mouth every 6 (six) hours as needed for moderate pain or headache.      Marland Kitchen aspirin EC 81 MG tablet Take 162 mg by mouth at bedtime.        . BUPROPION HBR ER PO Take 50 mg by mouth daily.      . clomiPHENE (CLOMID) 50 MG tablet Take 50 mg by mouth 2 (two) times daily.      Marland Kitchen gabapentin (NEURONTIN) 300 MG capsule Take 300 mg by mouth 3 (three) times daily.      . isosorbide mononitrate (IMDUR) 60 MG 24 hr tablet Take 60 mg by mouth daily.        Marland Kitchen losartan (COZAAR) 50 MG tablet Take 50 mg by mouth daily.      . Omega-3 Fatty Acids (FISH OIL) 1000 MG CAPS Take by mouth.      Marland Kitchen omeprazole (PRILOSEC) 20 MG capsule Take 20 mg by mouth 2 (two) times daily.        . simvastatin (ZOCOR) 80 MG tablet Take 40 mg by mouth at bedtime.      . traZODone (DESYREL) 100 MG tablet Take 100 mg by mouth at bedtime.       Marland Kitchen venlafaxine (EFFEXOR) 75 MG tablet Take 75 mg by mouth daily.      . vitamin C (ASCORBIC ACID) 500 MG tablet Take 1,000 mg by mouth at bedtime.       No current facility-administered medications for this visit.    LABS/IMAGING: No results found for this or any previous visit (from the past 48 hour(s)). No results found.  VITALS: BP 130/88  Pulse 68  Ht '5\' 9"'$  (1.753 m)  Wt 186 lb 6.4 oz (84.55 kg)  BMI 27.51 kg/m2  EXAM: General appearance: alert and no distress Neck: no carotid bruit and no JVD Lungs: clear to auscultation bilaterally Heart: regular rate and rhythm, S1, S2 normal, no murmur, click, rub or gallop Abdomen: soft, non-tender; bowel sounds normal; no masses,  no organomegaly Extremities: extremities normal, atraumatic, no cyanosis or edema Pulses: 2+ and symmetric Skin: Skin color, texture, turgor normal. No rashes or  lesions Neurologic: Grossly normal Psych: Mood, affect normal  EKG: Normal sinus rhythm at 68  ASSESSMENT: 1. Hypersomnolence/fatigue 2. Coronary artery disease status post Taxus DES to the first diagonal and 2002 3. PTSD 4. Hypertension 5. Dyslipidemia 6. Stable angina on imdur  PLAN: 1.   Cory [name] seems to be doing somewhat better since I last saw him. His energy level has improved somewhat. He is not describing any significant angina since he has been started on Imdur. He discussed possibly coming off of Imdur, since he's had no further chest pain. It is  reasonable to consider decreasing that to half dose for 2 weeks and if his symptoms do not return he could consider coming off of it. He is at risk for long-term tachyphylaxis on that medication. Blood pressure is well controlled and cholesterol is at goal. We will continue his current medications and plan to see him back annually or sooner as necessary.   [name], MD, [facility] Attending Cardiologist [facility]  Bradford Cazier C 10/28/2014, 6:21 PM

## 2015-06-01 ENCOUNTER — Inpatient Hospital Stay (HOSPITAL_COMMUNITY): Admission: RE | Admit: 2015-06-01 | Payer: Non-veteran care | Source: Ambulatory Visit

## 2015-06-04 ENCOUNTER — Other Ambulatory Visit: Payer: Self-pay | Admitting: Ophthalmology

## 2015-06-04 MED ORDER — TETRACAINE HCL 0.5 % OP SOLN: 1.0000 [drp] | OPHTHALMIC | Status: AC

## 2015-06-04 NOTE — H&P (Signed)
History & Physical:   DATE:  05-17-2015   NAME:  RHYLEN, KIMMES      AR:[id number]       HISTORY OF PRESENT ILLNESS: From [place]     Chief Eye Complaints  patient  STATES THAT VA IS BLURRY IN OD  patient  WAS ALSO DX 3-4 WEEKS AGO WITH CATARACT IN OD.  patient  IS NOT ON ANY GTTS.  patient  DENIES HAVING ANY PAIN     HPI: EYES: Reports symptoms of vision disturbances.  INTERFERING WITH DRIVING    cloudy OD      LOCATION:   RIGHT EYE        QUALITY/COURSE:   Reports condition is worsening.        INTENSITY/SEVERITY:    Reports measurement ( or degree) as mild.      DURATION:   Reports the general length of symptoms to be months.                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 Lucita Ferrara   MEDICATIONS: Effexor (Venlafaxine):   75 mg tablet  SIG-  1 each   2 times a day   Prilosec (Omeprazole):   20 mg delayed release capsule    SIG-   1 each      once a day               Trazodone:   100 mg tablet  SIG-  1 each   once a day (at bedtime)   Simvastatin (Zocor):    40 mg tablet   SIG-   1 each     once a day               Fish Oil:   - capsule  SIG-  1 -    Once daily    Multi-Vitamins with Minerals:    Multiple Vitamins with Minerals tablet    SIG-   1 -     once a day             i.e. Theragran-M and similar   Wellbutrin (Bupropion): 100 mg tablet  SIG-  1 each   2 times a day   Aspirin:  81 mg (tablet)  SIG-  1 each   once a day   Gabapentin: 300 mg capsule SIG-  Dose-  Freq-    Pred Forte: acetate 1% suspension SIG-  1 gtt in each affected eye 6 times a day   Acular (Ketorlac Tromethamine):   0.5% solution  SIG-  1 drop in OD QID   Ciloxan (Ciprofloxacin) Solution:  0.3% solution SIG-  1 drop in right eye BID  REVIEW OF SYSTEMS: ROS:   GEN- Constitutional: HENT: GEN - Endocrine: Reports symptoms of LUNGS/Respiratory:  HEART/Cardiovascular: Reports symptoms of ABD/Gastrointestinal:  Musculoskeletal (BJE): +++++      back pain (lower or lumbar-sacral)   NEURO/Neurological: PSYCH/Psychiatric:    Is the pt oriented to time, place, person? YES  Moodnormal     TOBACCO: Never smoker   ICD10: Z87.898 ICD9: V13.89 Onset: 05/17/2015 08:51 Initial  Date:     SOCIAL HISTORY:N RETIRED VETERAN  FAMILY HISTORY: Negative family history for  -   Hypertension:  ;   Diabetes - Type I: Family History - 1st Degree Relatives:  Son alive and well.  ALLERGIES:\ Vioxx, Benadryl, lisinopril   PHYSICAL EXAMINATION: VS: BMI: 25.9.  BP: 136/90.  H: 69.00 in.  P: 88 /min.  W: 175lbs 0oz.    Va     OD [place] 20/40  OS [place] 20/25-2    EYEGLASSES:NONE   Auto Refraction OD: -0.75 +1.00 x036 OS: -0.25 +0.75 x175  MR  05/17/2015 09:08  OD -1.25 +1.00 x036 20/40 OS -0.25 +0.50 x175 20/20 ADD +2.75  K's OD: 44.25  axis 137   44.75  axis 047 OS: 44.00  axis 018   44.25  axis 108  VF:   OD full in all four quadrants OS full in all four quadrants  Motility full   PUPILS: 45m reactive -mg pupil dilated only 5 mm   EYELIDS & OCULAR ADNEXA normal   SLE: Conjunctiva quiet   Cornea arcus visible lasix ring OS    anterior chamber   deep and quiet  slightly more shallow OD  Iris NORMAL  Lens 2-3  nuclear  sclerosis  OU posterior plaque OD   Vitreous vitreous floaters PVD  Ta   in mmHg    OD 16           OS 18 Time  05/17/2015 09:22   Dilation  phenyl 2.5% and trop 1% cyclopentolate 1%cyclopentolate 1%    Fundus: optic nerve  OD small 40% cup                                                                      OS slight discoloration small temporal rim flattened   Macula      OD dull light reflex                                                    OS    Vessels WNL   Periphery inferior cobblestone degeneration OS, normal OS     Exam: GENERAL: Appearance: HEAD, EARS, NOSE AND THROAT: Ears-Nose (external) Inspection: Externally, nose and ears are normal in appearance and without scars, lesions, or nodules.      Hearing assessment shows no problems with normal conversation.      LUNGS and RESPIRATORY: Lung auscultation elicits no wheezing, rhonci, rales or rubs and with equal breath sounds.    Respiratory effort described as breathing is unlabored and chest movement is symmetrical.    HEART (Cardiovascular): Heart auscultation discovers regular rate and rhythm; no murmur, gallop or rub. Normal heart sounds.    ABDOMEN (Gastrointestinal): Mass/Tenderness Exam: Neither are present.     MUSCULOSKELETAL (BJE): Inspection-Palpation: No major bone, joint, tendon, or muscle changes.      NEUROLOGICAL: Alert and oriented. No major deficits of coordination or sensation.      PSYCHIATRIC: Insight and judgment appear  both to be intact and appropriate.    Mood and affect are described as normal mood and full  affect.    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      ___________________________ Marylynn Pearson, Lauris Chroman - Inactive Problems:

## 2015-06-06 MED ORDER — TROPICAMIDE 1 % OP SOLN: 1.0000 [drp] | OPHTHALMIC | Status: DC

## 2015-06-06 MED ORDER — PHENYLEPHRINE HCL 2.5 % OP SOLN
1.0000 [drp] | OPHTHALMIC | Status: DC
Start: 2015-06-07 — End: 2015-06-06

## 2015-06-06 MED ORDER — GATIFLOXACIN 0.5 % OP SOLN: 1.0000 [drp] | OPHTHALMIC | Status: DC

## 2015-06-06 MED ORDER — PREDNISOLONE ACETATE 1 % OP SUSP: 1.0000 [drp] | OPHTHALMIC | Status: DC

## 2015-06-06 MED ORDER — CYCLOPENTOLATE HCL 1 % OP SOLN: 1.0000 [drp] | OPHTHALMIC | Status: DC

## 2015-06-07 ENCOUNTER — Encounter (HOSPITAL_COMMUNITY): Admission: RE | Disposition: A | Discharge status: Home / Self Care | Payer: Self-pay | Attending: Ophthalmology

## 2015-06-07 ENCOUNTER — Encounter (HOSPITAL_COMMUNITY): Payer: Self-pay | Admitting: Anesthesiology

## 2015-06-07 ENCOUNTER — Ambulatory Visit (HOSPITAL_COMMUNITY): Payer: No Typology Code available for payment source | Admitting: Anesthesiology

## 2015-06-07 ENCOUNTER — Encounter (HOSPITAL_COMMUNITY): Admission: RE | Payer: Self-pay | Source: Ambulatory Visit

## 2015-06-07 ENCOUNTER — Ambulatory Visit (HOSPITAL_COMMUNITY): Admission: RE | Admit: 2015-06-07 | Payer: Non-veteran care | Source: Ambulatory Visit | Admitting: Ophthalmology

## 2015-06-07 ENCOUNTER — Ambulatory Visit (HOSPITAL_COMMUNITY)
Admission: RE | Admit: 2015-06-07 | Discharge: 2015-06-07 | Disposition: A | Discharge status: Home / Self Care | Payer: No Typology Code available for payment source | Source: Other Acute Inpatient Hospital | Attending: Ophthalmology | Admitting: Ophthalmology

## 2015-06-07 DIAGNOSIS — H5709 Other anomalies of pupillary function: Secondary | ICD-10-CM | POA: Insufficient documentation

## 2015-06-07 DIAGNOSIS — H2181 Floppy iris syndrome: Secondary | ICD-10-CM | POA: Insufficient documentation

## 2015-06-07 DIAGNOSIS — K219 Gastro-esophageal reflux disease without esophagitis: Secondary | ICD-10-CM | POA: Insufficient documentation

## 2015-06-07 DIAGNOSIS — Z7982 Long term (current) use of aspirin: Secondary | ICD-10-CM | POA: Diagnosis not present

## 2015-06-07 DIAGNOSIS — I2510 Atherosclerotic heart disease of native coronary artery without angina pectoris: Secondary | ICD-10-CM | POA: Insufficient documentation

## 2015-06-07 DIAGNOSIS — H269 Unspecified cataract: Secondary | ICD-10-CM | POA: Diagnosis not present

## 2015-06-07 DIAGNOSIS — Z955 Presence of coronary angioplasty implant and graft: Secondary | ICD-10-CM | POA: Diagnosis not present

## 2015-06-07 DIAGNOSIS — E785 Hyperlipidemia, unspecified: Secondary | ICD-10-CM | POA: Diagnosis not present

## 2015-06-07 DIAGNOSIS — D649 Anemia, unspecified: Secondary | ICD-10-CM | POA: Diagnosis not present

## 2015-06-07 DIAGNOSIS — M1990 Unspecified osteoarthritis, unspecified site: Secondary | ICD-10-CM | POA: Insufficient documentation

## 2015-06-07 DIAGNOSIS — Z888 Allergy status to other drugs, medicaments and biological substances status: Secondary | ICD-10-CM | POA: Insufficient documentation

## 2015-06-07 DIAGNOSIS — I10 Essential (primary) hypertension: Secondary | ICD-10-CM | POA: Diagnosis not present

## 2015-06-07 HISTORY — DX: Unspecified osteoarthritis, unspecified site: M19.90

## 2015-06-07 HISTORY — DX: Anemia, unspecified: D64.9

## 2015-06-07 HISTORY — DX: Gastro-esophageal reflux disease without esophagitis: K21.9

## 2015-06-07 HISTORY — PX: CATARACT EXTRACTION W/PHACO: SHX586

## 2015-06-07 LAB — BASIC METABOLIC PANEL
Anion gap: 8 (ref 5–15)
BUN: 17 mg/dL (ref 6–20)
CO2: 24 mmol/L (ref 22–32)
Calcium: 9 mg/dL (ref 8.9–10.3)
Chloride: 105 mmol/L (ref 101–111)
Creatinine, Ser: 0.96 mg/dL (ref 0.61–1.24)
GFR calc Af Amer: >60 mL/min (ref >60)
GFR calc non Af Amer: >60 mL/min (ref >60)
Glucose, Bld: 118 mg/dL — ABNORMAL HIGH (ref 65–99)
Potassium: 4.6 mmol/L (ref 3.5–5.1)
Sodium: 137 mmol/L (ref 135–145)

## 2015-06-07 LAB — CBC
HCT: 39.7 % (ref 39.0–52.0)
Hemoglobin: 13.7 g/dL (ref 13.0–17.0)
MCH: 30.6 pg (ref 26.0–34.0)
MCHC: 34.5 g/dL (ref 30.0–36.0)
MCV: 88.8 fL (ref 78.0–100.0)
Platelets: 150 10*3/uL (ref 150–400)
RBC: 4.47 MIL/uL (ref 4.22–5.81)
RDW: 13.6 % (ref 11.5–15.5)
WBC: 8.3 10*3/uL (ref 4.0–10.5)

## 2015-06-07 SURGERY — PHACOEMULSIFICATION, CATARACT, WITH IOL INSERTION
Anesthesia: General | Laterality: Right

## 2015-06-07 SURGERY — PHACOEMULSIFICATION, CATARACT, WITH IOL INSERTION
Anesthesia: Monitor Anesthesia Care | Site: Eye | Laterality: Right

## 2015-06-07 MED ORDER — NA CHONDROIT SULF-NA HYALURON 40-30 MG/ML IO SOLN
INTRAOCULAR | Status: AC
  Filled 2015-06-07: qty 0.5

## 2015-06-07 MED ORDER — FENTANYL CITRATE (PF) 250 MCG/5ML IJ SOLN
INTRAMUSCULAR | Status: AC
Start: 2015-06-07 — End: 2015-06-07
  Filled 2015-06-07: qty 5

## 2015-06-07 MED ORDER — LIDOCAINE-EPINEPHRINE 2 %-1:100000 IJ SOLN
INTRAMUSCULAR | Status: DC | PRN
  Administered 2015-06-07: 5 mL via RETROBULBAR

## 2015-06-07 MED ORDER — TOBRAMYCIN 0.3 % OP OINT
TOPICAL_OINTMENT | OPHTHALMIC | Status: DC | PRN
  Administered 2015-06-07: 1 via OPHTHALMIC

## 2015-06-07 MED ORDER — TROPICAMIDE 1 % OP SOLN
OPHTHALMIC | Status: AC
  Filled 2015-06-07: qty 3

## 2015-06-07 MED ORDER — TOBRAMYCIN-DEXAMETHASONE 0.3-0.1 % OP OINT
TOPICAL_OINTMENT | OPHTHALMIC | Status: AC
  Filled 2015-06-07: qty 3.5

## 2015-06-07 MED ORDER — FENTANYL CITRATE (PF) 250 MCG/5ML IJ SOLN
INTRAMUSCULAR | Status: DC | PRN
Start: 2015-06-07 — End: 2015-06-07
  Administered 2015-06-07: 50 ug via INTRAVENOUS

## 2015-06-07 MED ORDER — LIDOCAINE HCL (CARDIAC) 20 MG/ML IV SOLN
INTRAVENOUS | Status: DC | PRN
  Administered 2015-06-07: 10 mg via INTRAVENOUS

## 2015-06-07 MED ORDER — PROPOFOL 10 MG/ML IV BOLUS
INTRAVENOUS | Status: DC | PRN
  Administered 2015-06-07 (×2): 10 mg via INTRAVENOUS

## 2015-06-07 MED ORDER — EPINEPHRINE HCL 1 MG/ML IJ SOLN
INTRAOCULAR | Status: DC | PRN
  Administered 2015-06-07: 12:00:00

## 2015-06-07 MED ORDER — NA CHONDROIT SULF-NA HYALURON 40-30 MG/ML IO SOLN
INTRAOCULAR | Status: DC | PRN
  Administered 2015-06-07 (×3): 0.5 mL via INTRAOCULAR

## 2015-06-07 MED ORDER — LIDOCAINE-EPINEPHRINE 2 %-1:100000 IJ SOLN
INTRAMUSCULAR | Status: AC
  Filled 2015-06-07: qty 1

## 2015-06-07 MED ORDER — 0.9 % SODIUM CHLORIDE (POUR BTL) OPTIME
TOPICAL | Status: DC | PRN
  Administered 2015-06-07: 100 mL

## 2015-06-07 MED ORDER — LIDOCAINE HCL (CARDIAC) 20 MG/ML IV SOLN
INTRAVENOUS | Status: AC
  Filled 2015-06-07: qty 5

## 2015-06-07 MED ORDER — GATIFLOXACIN 0.5 % OP SOLN
1.0000 [drp] | OPHTHALMIC | Status: AC | PRN
  Administered 2015-06-07 (×3): 1 [drp] via OPHTHALMIC

## 2015-06-07 MED ORDER — PHENYLEPHRINE HCL 2.5 % OP SOLN
1.0000 [drp] | OPHTHALMIC | Status: AC | PRN
  Administered 2015-06-07 (×3): 1 [drp] via OPHTHALMIC
  Filled 2015-06-07: qty 2

## 2015-06-07 MED ORDER — CYCLOPENTOLATE HCL 2 % OP SOLN: 1.0000 [drp] | OPHTHALMIC | Status: DC

## 2015-06-07 MED ORDER — SODIUM HYALURONATE 10 MG/ML IO SOLN
INTRAOCULAR | Status: DC | PRN
  Administered 2015-06-07: 0.85 mL via INTRAOCULAR

## 2015-06-07 MED ORDER — PROPOFOL 10 MG/ML IV BOLUS
INTRAVENOUS | Status: AC
  Filled 2015-06-07: qty 20

## 2015-06-07 MED ORDER — BUPIVACAINE HCL (PF) 0.75 % IJ SOLN
INTRAMUSCULAR | Status: AC
  Filled 2015-06-07: qty 10

## 2015-06-07 MED ORDER — MIDAZOLAM HCL 2 MG/2ML IJ SOLN
INTRAMUSCULAR | Status: DC | PRN
  Administered 2015-06-07 (×2): 1 mg via INTRAVENOUS

## 2015-06-07 MED ORDER — GATIFLOXACIN 0.5 % OP SOLN
OPHTHALMIC | Status: AC
  Filled 2015-06-07: qty 2.5

## 2015-06-07 MED ORDER — ACETYLCHOLINE CHLORIDE 20 MG IO SOLR
INTRAOCULAR | Status: AC
  Filled 2015-06-07: qty 1

## 2015-06-07 MED ORDER — SODIUM CHLORIDE 0.9 % IV SOLN
INTRAVENOUS | Status: DC
  Administered 2015-06-07: 11:00:00 via INTRAVENOUS

## 2015-06-07 MED ORDER — TETRACAINE HCL 0.5 % OP SOLN
OPHTHALMIC | Status: AC
  Filled 2015-06-07: qty 2

## 2015-06-07 MED ORDER — GENTAMICIN SULFATE 40 MG/ML IJ SOLN
INTRAMUSCULAR | Status: AC
  Filled 2015-06-07: qty 2

## 2015-06-07 MED ORDER — PREDNISOLONE ACETATE 1 % OP SUSP
1.0000 [drp] | OPHTHALMIC | Status: AC | PRN
  Administered 2015-06-07 (×3): 1 [drp] via OPHTHALMIC
  Filled 2015-06-07: qty 5

## 2015-06-07 MED ORDER — CYCLOPENTOLATE HCL 2 % OP SOLN: 1.0000 [drp] | OPHTHALMIC | Status: AC | PRN

## 2015-06-07 MED ORDER — ACETYLCHOLINE CHLORIDE 20 MG IO SOLR
INTRAOCULAR | Status: DC | PRN
  Administered 2015-06-07: 20 mg via INTRAOCULAR

## 2015-06-07 MED ORDER — PILOCARPINE HCL 4 % OP SOLN
OPHTHALMIC | Status: AC
  Filled 2015-06-07: qty 15

## 2015-06-07 MED ORDER — MANNITOL 25 % IV SOLN
INTRAVENOUS | Status: DC | PRN
  Administered 2015-06-07: 25 g via INTRAVENOUS

## 2015-06-07 MED ORDER — CYCLOPENTOLATE HCL 1 % OP SOLN
1.0000 [drp] | OPHTHALMIC | Status: AC | PRN
  Administered 2015-06-07 (×2): 1 [drp] via OPHTHALMIC

## 2015-06-07 MED ORDER — GATIFLOXACIN 0.5 % OP SOLN: 1.0000 [drp] | OPHTHALMIC | Status: DC

## 2015-06-07 MED ORDER — MIDAZOLAM HCL 2 MG/2ML IJ SOLN
INTRAMUSCULAR | Status: AC
  Filled 2015-06-07: qty 2

## 2015-06-07 MED ORDER — LIDOCAINE HCL 2 % IJ SOLN
INTRAMUSCULAR | Status: AC
Start: 2015-06-07 — End: 2015-06-07
  Filled 2015-06-07: qty 20

## 2015-06-07 MED ORDER — SODIUM HYALURONATE 10 MG/ML IO SOLN
INTRAOCULAR | Status: AC
  Filled 2015-06-07: qty 0.85

## 2015-06-07 MED ORDER — BSS IO SOLN
INTRAOCULAR | Status: AC
  Filled 2015-06-07: qty 500

## 2015-06-07 MED ORDER — HYALURONIDASE HUMAN 150 UNIT/ML IJ SOLN
INTRAMUSCULAR | Status: AC
  Filled 2015-06-07: qty 1

## 2015-06-07 MED ORDER — DEXAMETHASONE SODIUM PHOSPHATE 10 MG/ML IJ SOLN
INTRAMUSCULAR | Status: AC
  Filled 2015-06-07: qty 1

## 2015-06-07 MED ORDER — TROPICAMIDE 1 % OP SOLN
1.0000 [drp] | OPHTHALMIC | Status: AC | PRN
  Administered 2015-06-07 (×3): 1 [drp] via OPHTHALMIC
  Filled 2015-06-07: qty 2

## 2015-06-07 MED ORDER — EPINEPHRINE HCL 1 MG/ML IJ SOLN
INTRAMUSCULAR | Status: AC
  Filled 2015-06-07: qty 1

## 2015-06-07 MED ORDER — CYCLOPENTOLATE HCL 2 % OP SOLN
1.0000 [drp] | OPHTHALMIC | Status: DC | PRN
  Filled 2015-06-07: qty 2

## 2015-06-07 MED ORDER — BSS IO SOLN
INTRAOCULAR | Status: DC | PRN
  Administered 2015-06-07: 15 mL via INTRAOCULAR

## 2015-06-07 MED ORDER — CYCLOPENTOLATE HCL 1 % OP SOLN
OPHTHALMIC | Status: AC
  Administered 2015-06-07: 1 [drp] via OPHTHALMIC
  Filled 2015-06-07: qty 2

## 2015-06-07 MED ORDER — ONDANSETRON HCL 4 MG/2ML IJ SOLN
INTRAMUSCULAR | Status: DC | PRN
  Administered 2015-06-07: 4 mg via INTRAVENOUS

## 2015-06-07 SURGICAL SUPPLY — 34 items
APPLICATOR COTTON TIP 6IN STRL (MISCELLANEOUS) ×2 IMPLANT
APPLICATOR DR MATTHEWS STRL (MISCELLANEOUS) ×2 IMPLANT
BLADE KERATOME 2.75 (BLADE) ×2 IMPLANT
CANNULA ANTERIOR CHAMBER 27GA (MISCELLANEOUS) ×2 IMPLANT
CORDS BIPOLAR (ELECTRODE) IMPLANT
COVER MAYO STAND STRL (DRAPES) ×2 IMPLANT
DRAPE OPHTHALMIC 40X48 W POUCH (DRAPES) ×2 IMPLANT
DRAPE RETRACTOR (MISCELLANEOUS) ×2 IMPLANT
GLOVE BIO SURGEON STRL SZ8 (GLOVE) ×2 IMPLANT
GLOVE SURG SS PI 6.5 STRL IVOR (GLOVE) ×2 IMPLANT
GOWN STRL REUS W/ TWL LRG LVL3 (GOWN DISPOSABLE) ×2 IMPLANT
GOWN STRL REUS W/TWL LRG LVL3 (GOWN DISPOSABLE) ×4
KIT BASIN OR (CUSTOM PROCEDURE TRAY) ×2 IMPLANT
KIT ROOM TURNOVER OR (KITS) ×2 IMPLANT
LENS IOL ACRSF IQ PC 23.5 (Intraocular Lens) ×1 IMPLANT
LENS IOL ACRYSOF IQ POST 23.5 (Intraocular Lens) ×2 IMPLANT
NEEDLE 18GX1X1/2 (RX/OR ONLY) (NEEDLE) ×2 IMPLANT
NEEDLE 25GX 5/8IN NON SAFETY (NEEDLE) ×2 IMPLANT
NEEDLE FILTER BLUNT 18X 1/2SAF (NEEDLE) ×1
NEEDLE FILTER BLUNT 18X1 1/2 (NEEDLE) ×1 IMPLANT
NEEDLE HYPO 25GX1X1/2 BEV (NEEDLE) ×2 IMPLANT
NS IRRIG 1000ML POUR BTL (IV SOLUTION) ×2 IMPLANT
PACK CATARACT CUSTOM (CUSTOM PROCEDURE TRAY) ×2 IMPLANT
PAD ARMBOARD 7.5X6 YLW CONV (MISCELLANEOUS) ×2 IMPLANT
PAK PIK CVS CATARACT (OPHTHALMIC) ×2 IMPLANT
RING MALYGIN (MISCELLANEOUS) ×2 IMPLANT
STRIP CLOSURE SKIN 1/2X4 (GAUZE/BANDAGES/DRESSINGS) ×2 IMPLANT
SUT ETHILON 10 0 CS140 6 (SUTURE) ×2 IMPLANT
SUT SILK 6 0 G 6 (SUTURE) IMPLANT
SYR TB 1ML LUER SLIP (SYRINGE) ×2 IMPLANT
TIP ABS 45DEG FLARED 0.9MM (TIP) ×2 IMPLANT
TOWEL OR 17X26 10 PK STRL BLUE (TOWEL DISPOSABLE) ×2 IMPLANT
WATER STERILE IRR 1000ML POUR (IV SOLUTION) ×2 IMPLANT
WIPE INSTRUMENT VISIWIPE 73X73 (MISCELLANEOUS) ×2 IMPLANT

## 2015-06-07 NOTE — H&P (View-Only) (Signed)
History & Physical:   DATE:  05-17-2015   NAME:  Cory [name], Cory [name]      [id number]       HISTORY OF PRESENT ILLNESS: From [place]     Chief Eye Complaints  patient  STATES THAT VA IS BLURRY IN OD  patient  WAS ALSO DX 3-4 WEEKS AGO WITH CATARACT IN OD.  patient  IS NOT ON ANY GTTS.  patient  DENIES HAVING ANY PAIN     HPI: EYES: Reports symptoms of vision disturbances.  INTERFERING WITH DRIVING    cloudy OD      LOCATION:   RIGHT EYE        QUALITY/COURSE:   Reports condition is worsening.        INTENSITY/SEVERITY:    Reports measurement ( or degree) as mild.      DURATION:   Reports the general length of symptoms to be months.                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 Lucita Ferrara   MEDICATIONS: Effexor (Venlafaxine):   75 mg tablet  SIG-  1 each   2 times a day   Prilosec (Omeprazole):   20 mg delayed release capsule    SIG-   1 each      once a day               Trazodone:   100 mg tablet  SIG-  1 each   once a day (at bedtime)   Simvastatin (Zocor):    40 mg tablet   SIG-   1 each     once a day               Fish Oil:   - capsule  SIG-  1 -    Once daily    Multi-Vitamins with Minerals:    Multiple Vitamins with Minerals tablet    SIG-   1 -     once a day             i.e. Theragran-M and similar   Wellbutrin (Bupropion): 100 mg tablet  SIG-  1 each   2 times a day   Aspirin:  81 mg (tablet)  SIG-  1 each   once a day   Gabapentin: 300 mg capsule SIG-  Dose-  Freq-    Pred Forte: acetate 1% suspension SIG-  1 gtt in each affected eye 6 times a day   Acular (Ketorlac Tromethamine):   0.5% solution  SIG-  1 drop in OD QID   Ciloxan (Ciprofloxacin) Solution:  0.3% solution SIG-  1 drop in right eye BID  REVIEW OF SYSTEMS: ROS:   GEN- Constitutional: HENT: GEN - Endocrine: Reports symptoms of LUNGS/Respiratory:  HEART/Cardiovascular: Reports symptoms of ABD/Gastrointestinal:  Musculoskeletal (BJE): +++++      back pain (lower or lumbar-sacral)   NEURO/Neurological: PSYCH/Psychiatric:    Is the pt oriented to time, place, person? YES  Moodnormal     TOBACCO: Never smoker   ICD10: Z87.898 ICD9: V13.89 Onset: 05/17/2015 08:51 Initial  Date:     SOCIAL HISTORY:N RETIRED VETERAN  FAMILY HISTORY: Negative family history for  -   Hypertension:  ;   Diabetes - Type I: Family History - 1st Degree Relatives:  Son alive and well.  ALLERGIES:\ Vioxx, Benadryl, lisinopril   PHYSICAL EXAMINATION: VS: BMI: 25.9.  BP: 136/90.  H: 69.00 in.  P: 88 /min.  W: 175lbs 0oz.    Va     OD  20/40  OS  20/25-2    EYEGLASSES:NONE   Auto Refraction OD: -0.75 +1.00 x036 OS: -0.25 +0.75 x175  MR  05/17/2015 09:08  OD -1.25 +1.00 x036 20/40 OS -0.25 +0.50 x175 20/20 ADD +2.75  K's OD: 44.25  axis 137   44.75  axis 047 OS: 44.00  axis 018   44.25  axis 108  VF:   OD full in all four quadrants OS full in all four quadrants  Motility full   PUPILS: 7m reactive -mg pupil dilated only 5 mm   EYELIDS & OCULAR ADNEXA normal   SLE: Conjunctiva quiet   Cornea arcus visible lasix ring OS    anterior chamber   deep and quiet  slightly more shallow OD  Iris NORMAL  Lens 2-3  nuclear  sclerosis  OU posterior plaque OD   Vitreous vitreous floaters PVD  Ta   in mmHg    OD 16           OS 18 Time  05/17/2015 09:22   Dilation  phenyl 2.5% and trop 1% cyclopentolate 1%cyclopentolate 1%    Fundus: optic nerve  OD small 40% cup                                                                      OS slight discoloration small temporal rim flattened   Macula      OD dull light reflex                                                    OS    Vessels WNL   Periphery inferior cobblestone degeneration OS, normal OS     Exam: GENERAL: Appearance: HEAD, EARS, NOSE AND THROAT: Ears-Nose (external) Inspection: Externally, nose and ears are normal in appearance and without scars, lesions, or nodules.      Hearing assessment shows no problems with normal conversation.      LUNGS and RESPIRATORY: Lung auscultation elicits no wheezing, rhonci, rales or rubs and with equal breath sounds.    Respiratory effort described as breathing is unlabored and chest movement is symmetrical.    HEART (Cardiovascular): Heart auscultation discovers regular rate and rhythm; no murmur, gallop or rub. Normal heart sounds.    ABDOMEN (Gastrointestinal): Mass/Tenderness Exam: Neither are present.     MUSCULOSKELETAL (BJE): Inspection-Palpation: No major bone, joint, tendon, or muscle changes.      NEUROLOGICAL: Alert and oriented. No major deficits of coordination or sensation.      PSYCHIATRIC: Insight and judgment appear  both to be intact and appropriate.    Mood and affect are described as normal mood and full  affect.    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      ___________________________ Marylynn Pearson, Lauris Chroman - Inactive Problems:

## 2015-06-07 NOTE — Transfer of Care (Signed)
Immediate Anesthesia Transfer of Care Note  Patient: Cory [name]  Procedure(s) Performed: Procedure(s): CATARACT EXTRACTION PHACO AND INTRAOCULAR LENS PLACEMENT (IOC) (Right)  Patient Location: PACU  Anesthesia Type:MAC  Level of Consciousness: awake, alert , oriented and sedated  Airway & Oxygen Therapy: Patient Spontanous Breathing  Post-op Assessment: Report given to RN, Post -op Vital signs reviewed and stable and Patient moving all extremities  Post vital signs: Reviewed and stable  Last Vitals:  Filed Vitals:   06/07/15 0927  BP: 150/75  Pulse: 53  Temp: 36.4 C  Resp: 18    Complications: No apparent anesthesia complications

## 2015-06-07 NOTE — Op Note (Signed)
Preop diagnosis: Visually significant complex cataract with poorly dilating pupil and floppy iris right eye Postop diagnosis: Same Procedure: Phacoemulsification with intraocular lens implant using MyLuken ring special device for dilating pupil Anesthesia: 2% Xylocaine with epinephrine in a 5050 mixture 0.75% Marcaine with ample Wydase Combinations: None Procedure: The patient was transported to the operating room where he was given a peribulbar block with the aforementioned local anesthesia to gauge and. Following this the patient's face was prepped and draped in the usual sterile fashion. With the surgeon sitting temporally the operating microscope in position a Weck-Cel sponges used to fixate the globe and a 15 blade was used to enter through superior clear cornea Viscoat was injected into the anterior chamber following this with an additional Weck-Cel sponge to fixate the globe a 2.75 mm keratome blade was used in a stepwise fashion to enter through temporal clear cornea. The pupil size was measured and noted to be less than 6 mm therefore the ring was taken from the package the ring was then injected and expanded the pupil to greater than 6.2 mm following this the ring injector was removed additional Viscoat was injected in the eye 25-gauge needle was used to incise anterior capsule and a continuous tear curvilinear capsulorrhexis was formed BSS was then used to hydrodissect the nucleus is a cannula was removed from the eye the iris prolapsed through the incision using the Viscoat cannula several attempts were made to reinsert the iris but the iris would not appropriately reinsert therefore a iridectomy was performed the iris still remained very floppy into the incision area after attempting to deepen the chamber the phacoemulsification unit was then used to enter the eye a small central trough was sculpted however there appeared to be positive posterior pressure posterior to the lens. At this point the  patient was given intravenous mannitol. After being given intravenous mannitol the eye was softer and therefore the procedure was able to proceed the floppy iris became so floppy that the ring came off the iris the mother Luken ring was then removed from the eye phacoemulsification was then carried out in routine fashion sculpting centrally creating 3 troughs the nucleus was then divided into 3 parts and all nuclear fragments were removed from the eye and the posterior capsule remained intact. The irrigation-aspiration device was then used to strip cortical fibers the posterior capsule the posterior capsule remained intact after all cortical fibers had been removed Provisc was injected in the anterior chamber the intraocular lens implant was examined and noted to have no defects the lens was an Alcon AcrySof IQ SN 60 WF 23.5 dpt lens SN #[id number] following this the irrigation-aspiration device was used to remove Provisc on the eye which had been injected to inflate the capsular bag for lens insertion. Following this Miochol was injected in the eye the pupil partly constricted the iris was redeposited a 10-0 nylon suture was placed to achieve watertight closure after the eye was pressurized there was no leakage therefore all instruments were removed from the eye. Topical Tobradex ointment was applied to the eye a patch and Fox U were placed and the patient returned to recovery area in stable condition Marylynn Pearson Junior M.D. 5 the lens is injected and positioned with a Kuglen hook.

## 2015-06-07 NOTE — Discharge Instructions (Signed)
The patient may remove the eye patch today at 6:00 PM and applied the eyedrops that were given to him at the doctor's office. He should sleep on his back or left side do not rub the eye avoid heavy lifting bending and straining.

## 2015-06-07 NOTE — Anesthesia Preprocedure Evaluation (Signed)
Anesthesia Evaluation  Patient identified by MRN, date of birth, ID band Patient awake    Reviewed: Allergy & Precautions, H&P , NPO status , Patient's Chart, lab work & pertinent test results  Airway Mallampati: II  TM Distance: >3 FB Neck ROM: Full    Dental no notable dental hx. (+) Teeth Intact, Dental Advisory Given   Pulmonary neg pulmonary ROS,  breath sounds clear to auscultation  Pulmonary exam normal       Cardiovascular hypertension, Pt. on medications + CAD and + Cardiac Stents Rhythm:Regular Rate:Normal     Neuro/Psych negative neurological ROS  negative psych ROS   GI/Hepatic Neg liver ROS, GERD-  Controlled,  Endo/Other  negative endocrine ROS  Renal/GU negative Renal ROS  negative genitourinary   Musculoskeletal  (+) Arthritis -, Osteoarthritis,    Abdominal   Peds  Hematology negative hematology ROS (+)   Anesthesia Other Findings   Reproductive/Obstetrics negative OB ROS                             Anesthesia Physical Anesthesia Plan  ASA: III  Anesthesia Plan: MAC   Post-op Pain Management:    Induction: Intravenous  Airway Management Planned: Nasal Cannula  Additional Equipment:   Intra-op Plan:   Post-operative Plan:   Informed Consent: I have reviewed the patients History and Physical, chart, labs and discussed the procedure including the risks, benefits and alternatives for the proposed anesthesia with the patient or authorized representative who has indicated his/her understanding and acceptance.   Dental advisory given  Plan Discussed with: CRNA  Anesthesia Plan Comments:         Anesthesia Quick Evaluation

## 2015-06-07 NOTE — Interval H&P Note (Signed)
History and Physical Interval Note:  06/07/2015 11:44 AM  Cory [name]  has presented today for surgery, with the diagnosis of Right Eye Cataract  The various methods of treatment have been discussed with the patient and family. After consideration of risks, benefits and other options for treatment, the patient has consented to  Procedure(s): CATARACT EXTRACTION PHACO AND INTRAOCULAR LENS PLACEMENT ([place]) (Right) as a surgical intervention .  The patient's history has been reviewed, patient examined, no change in status, stable for surgery.  I have reviewed the patient's chart and labs.  Questions were answered to the patient's satisfaction.   Use IOL [id number] IQ 23.5      AC 20.0        MA 22.5   Derik Fults

## 2015-06-07 NOTE — Anesthesia Postprocedure Evaluation (Signed)
  Anesthesia Post-op Note  Patient: Cory [name]  Procedure(s) Performed: Procedure(s): CATARACT EXTRACTION PHACO AND INTRAOCULAR LENS PLACEMENT (IOC) (Right)  Patient Location: PACU  Anesthesia Type: MAC  Level of Consciousness: awake and alert   Airway and Oxygen Therapy: Patient Spontanous Breathing  Post-op Pain: none  Post-op Assessment: Post-op Vital signs reviewed, Patient's Cardiovascular Status Stable and Respiratory Function Stable  Post-op Vital Signs: Reviewed  Filed Vitals:   06/07/15 1424  BP: 146/80  Pulse: 58  Temp:   Resp: 20    Complications: No apparent anesthesia complications

## 2015-06-08 ENCOUNTER — Encounter (HOSPITAL_COMMUNITY): Payer: Self-pay | Admitting: Ophthalmology

## 2015-06-13 ENCOUNTER — Encounter (INDEPENDENT_AMBULATORY_CARE_PROVIDER_SITE_OTHER): Payer: Self-pay | Admitting: *Deleted

## 2015-09-14 ENCOUNTER — Ambulatory Visit (INDEPENDENT_AMBULATORY_CARE_PROVIDER_SITE_OTHER): Payer: PPO | Admitting: Internal Medicine

## 2015-09-14 ENCOUNTER — Encounter (INDEPENDENT_AMBULATORY_CARE_PROVIDER_SITE_OTHER): Payer: Self-pay | Admitting: Internal Medicine

## 2015-09-14 VITALS — BP 98/58 | HR 72 | Temp 98.5°F | Ht 69.5 in | Wt 183.1 lb

## 2015-09-14 DIAGNOSIS — K2270 Barrett's esophagus without dysplasia: Secondary | ICD-10-CM | POA: Diagnosis not present

## 2015-09-14 NOTE — Patient Instructions (Signed)
OV in 1 year.  

## 2015-09-14 NOTE — Progress Notes (Signed)
Subjective:    Patient ID: Cory [name], male    DOB: Jun 11, 1946, 69 y.o.   MRN: [id number]  HPI Here today for f/u of his GERD and Barrett's esophagus. Underwent an EGD last year for melena. There was no evidendce of PUD on EGD. He tells me he is doing good. He has not had any black stools. Appetite is good . No weight loss. No dysphagia. No acid reflux.  BMs are normal. No melena or BRRB. He has starting walking since the weather broke.  His last colonoscopy was in 2013 for iron deficiency anemia. Colonoscopy revealed pancolonic diverticulosis.    CBC    Component Value Date/Time   WBC 8.3 06/07/2015 1010   RBC 4.47 06/07/2015 1010   HGB 13.7 06/07/2015 1010   HCT 39.7 06/07/2015 1010   PLT 150 06/07/2015 1010   MCV 88.8 06/07/2015 1010   MCH 30.6 06/07/2015 1010   MCHC 34.5 06/07/2015 1010   RDW 13.6 06/07/2015 1010   LYMPHSABS 1.3 07/25/2014 1210   MONOABS 0.4 07/25/2014 1210   EOSABS 0.2 07/25/2014 1210   BASOSABS 0.1 07/25/2014 1210        07/28/2014 EGD  Indications: Patient is 70 old Caucasian male who presented with history of melena. He has history of GERD complicated by short segment Barrett's esophagus. Melena. After he stopped Naprosyn. He BC and iron studies from 3 days ago are within normal limits.  Impression: Short segment Barrett's esophagus geographic pattern and maximal length of 2 cm. Small sliding hiatal hernia. No evidence of peptic ulcer disease.  Notes Recorded by Rogene Houston, MD on 08/08/2014 at 12:52 PM Biopsy results reviewed with patient. Barretts without dysplasia but HP present. Will do HP serology before therapy considered. Patient will stop by tomorrow to pick up paperwork Report to PCP H. Pylori negative. Recommendations:  No further workup unless melena recurs in which case he would need small bowel given  capsule study. Patient can stop ferrous sulfate since H&H and iron studies are within normal limits. I will be contacting pressure and biopsy results.   Review of Systems Past Medical History  Diagnosis Date  . Hypertension   . Hyperlipidemia   . Coronary artery disease     stent to 1st diagonal, mod disease of ramus intermedius in the RCA  . Pneumonia   . PTSD (post-traumatic stress disorder)   . History of nuclear stress test 03/2010    bruce myoview; mild ischemia in basal anterior & mid anterior region; low risk   . GERD (gastroesophageal reflux disease)   . Arthritis   . Anemia     Past Surgical History  Procedure Laterality Date  . Coronary angioplasty with stent placement  09/2001    Taxus (2.5x60m) stent to 1st diagonal   . Shoulder surgery    . Knee surgery    . Tonsillectomy    . Colonoscopy  02/25/2012    Procedure: COLONOSCOPY;  Surgeon: MJamesetta So MD;  [facility]: [facility];  Service: Gastroenterology;  Laterality: N/A;  . Esophagogastroduodenoscopy  02/25/2012    Procedure: ESOPHAGOGASTRODUODENOSCOPY (EGD);  Surgeon: MJamesetta So MD;  [facility]: [facility];  Service: Gastroenterology;  Laterality: N/A;  . Transthoracic echocardiogram  03/2010    EF=>55%; LV mildly dilated; LA mod dilated; mild MR & TR; normal RVSP; mild pulm valve regurg  . Cardiac catheterization  05/17/2010    left main free of significant lesion; LAD with 60% lesion in mid segment & 60-70%  lesion in mid-distal segment with subtotal occlusion at apex & small side branch of diagonal 1 that is subtotally occluded; ramus intermedius with 90% ostial stenosis; L Cfx w/mild luminal irreg; RCA is dominant vessel with diffuse disease, 50% prox lesion & 60% lesion in mid segment & 60% lesion in PDA  . Lateral epicondyle release Left 01/21/2014    Procedure: Left Tennis Elbow Release with debridement  tendon repair with reattachment;  Surgeon: Carole Civil, MD;  Location: [facility];  Service:  Orthopedics;  Laterality: Left;  . Esophagogastroduodenoscopy N/A 07/28/2014    Procedure: ESOPHAGOGASTRODUODENOSCOPY (EGD);  Surgeon: Rogene Houston, MD;  [facility]: [facility];  Service: Endoscopy;  Laterality: N/A;  200-rescheduled to [place] notified pt  . Prostate surgery    . Penile implant    . Eye surgery Bilateral     lasik surgery   . Cataract extraction w/phaco Right 06/07/2015    Procedure: CATARACT EXTRACTION PHACO AND INTRAOCULAR LENS PLACEMENT (IOC);  Surgeon: Marylynn Pearson, MD;  Location: [facility];  Service: Ophthalmology;  Laterality: Right;    Allergies  Allergen Reactions  . Ace Inhibitors   . Benadryl [Diphenhydramine Hcl]     Opposite reaction of medication purpose - hyper  . Benadryl [Diphenhydramine-Zinc Acetate]   . Vioxx [Rofecoxib] Other (See Comments)    headache    Current Outpatient Prescriptions on File Prior to Visit  Medication Sig Dispense Refill  . aspirin EC 81 MG tablet Take 162 mg by mouth at bedtime.      . BUPROPION HBR ER PO Take 50 mg by mouth daily.    Marland Kitchen gabapentin (NEURONTIN) 300 MG capsule Take 300 mg by mouth 3 (three) times daily.    Marland Kitchen losartan (COZAAR) 100 MG tablet Take 100 mg by mouth daily.    Marland Kitchen omeprazole (PRILOSEC) 20 MG capsule Take 20 mg by mouth 2 (two) times daily.      . simvastatin (ZOCOR) 40 MG tablet Take 40 mg by mouth daily at 6 PM.    . traZODone (DESYREL) 100 MG tablet Take 100 mg by mouth at bedtime.     Marland Kitchen venlafaxine (EFFEXOR) 75 MG tablet Take 75 mg by mouth daily.    . vitamin B-12 (CYANOCOBALAMIN) 1000 MCG tablet Take 1,000 mcg by mouth daily.     No current facility-administered medications on file prior to visit.        Objective:   Physical Exam  Blood pressure 98/58, pulse 72, temperature 98.5 F (36.9 C), height 5' 9.5" (1.765 m), weight 183 lb 1.6 oz (83.054 kg).  Alert and oriented. Skin warm and dry. Oral mucosa is moist.   . Sclera anicteric, conjunctivae is pink. Thyroid not enlarged. No cervical  lymphadenopathy. Lungs clear. Heart regular rate and rhythm.  Abdomen is soft. Bowel sounds are positive. No hepatomegaly. No abdominal masses felt. No tenderness.  No edema to lower extremities.       Assessment & Plan:  Barrett's esophagus. Doing well. No GERD. No GI problems.  OV in 1 year.

## 2015-11-28 ENCOUNTER — Other Ambulatory Visit (HOSPITAL_COMMUNITY): Payer: Self-pay | Admitting: Family Medicine

## 2015-11-28 ENCOUNTER — Ambulatory Visit (HOSPITAL_COMMUNITY)
Admission: RE | Admit: 2015-11-28 | Discharge: 2015-11-28 | Disposition: A | Discharge status: Home / Self Care | Payer: PPO | Source: Ambulatory Visit | Attending: Family Medicine | Admitting: Family Medicine

## 2015-11-28 DIAGNOSIS — R059 Cough, unspecified: Secondary | ICD-10-CM

## 2015-11-28 DIAGNOSIS — R0989 Other specified symptoms and signs involving the circulatory and respiratory systems: Secondary | ICD-10-CM | POA: Insufficient documentation

## 2015-11-28 DIAGNOSIS — R05 Cough: Secondary | ICD-10-CM | POA: Diagnosis not present

## 2015-11-28 DIAGNOSIS — J029 Acute pharyngitis, unspecified: Secondary | ICD-10-CM | POA: Diagnosis not present

## 2015-12-31 DIAGNOSIS — I219 Acute myocardial infarction, unspecified: Secondary | ICD-10-CM

## 2015-12-31 HISTORY — DX: Acute myocardial infarction, unspecified: I21.9

## 2016-01-23 DIAGNOSIS — E782 Mixed hyperlipidemia: Secondary | ICD-10-CM | POA: Diagnosis not present

## 2016-01-23 DIAGNOSIS — Z23 Encounter for immunization: Secondary | ICD-10-CM | POA: Diagnosis not present

## 2016-01-23 DIAGNOSIS — Z1389 Encounter for screening for other disorder: Secondary | ICD-10-CM | POA: Diagnosis not present

## 2016-01-23 DIAGNOSIS — Z6825 Body mass index (BMI) 25.0-25.9, adult: Secondary | ICD-10-CM | POA: Diagnosis not present

## 2016-01-23 DIAGNOSIS — E663 Overweight: Secondary | ICD-10-CM | POA: Diagnosis not present

## 2016-03-13 DIAGNOSIS — K219 Gastro-esophageal reflux disease without esophagitis: Secondary | ICD-10-CM | POA: Diagnosis not present

## 2016-03-13 DIAGNOSIS — E785 Hyperlipidemia, unspecified: Secondary | ICD-10-CM | POA: Diagnosis not present

## 2016-03-13 DIAGNOSIS — I10 Essential (primary) hypertension: Secondary | ICD-10-CM | POA: Diagnosis not present

## 2016-03-13 DIAGNOSIS — R69 Illness, unspecified: Secondary | ICD-10-CM | POA: Diagnosis not present

## 2016-05-31 ENCOUNTER — Encounter (INDEPENDENT_AMBULATORY_CARE_PROVIDER_SITE_OTHER): Payer: Self-pay | Admitting: Internal Medicine

## 2016-06-04 DIAGNOSIS — D508 Other iron deficiency anemias: Secondary | ICD-10-CM | POA: Diagnosis not present

## 2016-06-04 DIAGNOSIS — Z6827 Body mass index (BMI) 27.0-27.9, adult: Secondary | ICD-10-CM | POA: Diagnosis not present

## 2016-06-04 DIAGNOSIS — Z1389 Encounter for screening for other disorder: Secondary | ICD-10-CM | POA: Diagnosis not present

## 2016-06-04 DIAGNOSIS — Z0000 Encounter for general adult medical examination without abnormal findings: Secondary | ICD-10-CM | POA: Diagnosis not present

## 2016-06-04 DIAGNOSIS — D509 Iron deficiency anemia, unspecified: Secondary | ICD-10-CM | POA: Diagnosis not present

## 2016-06-18 ENCOUNTER — Encounter (INDEPENDENT_AMBULATORY_CARE_PROVIDER_SITE_OTHER): Payer: Self-pay | Admitting: Internal Medicine

## 2016-06-18 ENCOUNTER — Ambulatory Visit (INDEPENDENT_AMBULATORY_CARE_PROVIDER_SITE_OTHER): Payer: Medicare HMO | Admitting: Internal Medicine

## 2016-06-18 ENCOUNTER — Other Ambulatory Visit (INDEPENDENT_AMBULATORY_CARE_PROVIDER_SITE_OTHER): Payer: Self-pay | Admitting: Internal Medicine

## 2016-06-18 ENCOUNTER — Encounter (INDEPENDENT_AMBULATORY_CARE_PROVIDER_SITE_OTHER): Payer: Self-pay | Admitting: *Deleted

## 2016-06-18 VITALS — BP 106/72 | HR 64 | Temp 97.0°F | Ht 69.5 in | Wt 183.2 lb

## 2016-06-18 DIAGNOSIS — R1310 Dysphagia, unspecified: Secondary | ICD-10-CM

## 2016-06-18 NOTE — Progress Notes (Signed)
Subjective:    Patient ID: Cory [name], male    DOB: 06-29-1946, 70 y.o.   MRN: [id number]  HPI Presents today with c/o dysphagia. He has trouble swallowing his pills and also is having trouble swallowing breads. Hx of EGD/ED in the past. Symptoms x 3-4 weeks. His last EGD was in July of 2015. EGD/ED in 2007.  Appetite is good. No weight loss.  He has a BM daily or every other day. . No melena or BRRB.     07/28/2014 EGD  Indications: Patient is 67 old Caucasian male who presented with history of melena. He has history of GERD complicated by short segment Barrett's esophagus. Melena. After he stopped Naprosyn. He BC and iron studies from 3 days ago are within normal limits.  Impression: Short segment Barrett's esophagus geographic pattern and maximal length of 2 cm. Small sliding hiatal hernia. No evidence of peptic ulcer disease.   In 2013 underwent an EGD/Colonoscopy for iron def. Anemia; Dr. Arnoldo Morale EGD: Normal. Colonoscopy: Severe diverticulosis. Otherwise normal.     Review of Systems Past Medical History  Diagnosis Date  . Hypertension   . Hyperlipidemia   . Coronary artery disease     stent to 1st diagonal, mod disease of ramus intermedius in the RCA  . Pneumonia   . PTSD (post-traumatic stress disorder)   . History of nuclear stress test 03/2010    bruce myoview; mild ischemia in basal anterior & mid anterior region; low risk   . GERD (gastroesophageal reflux disease)   . Arthritis   . Anemia     Past Surgical History  Procedure Laterality Date  . Coronary angioplasty with stent placement  09/2001    Taxus (2.5x11m) stent to 1st diagonal   . Shoulder surgery    . Knee surgery    . Tonsillectomy    . Colonoscopy  02/25/2012    Procedure: COLONOSCOPY;  Surgeon: MJamesetta So MD;  [facility];  Service: Gastroenterology;   Laterality: N/A;  . Esophagogastroduodenoscopy  02/25/2012    Procedure: ESOPHAGOGASTRODUODENOSCOPY (EGD);  Surgeon: MJamesetta So MD;  [facility];  Service: Gastroenterology;  Laterality: N/A;  . Transthoracic echocardiogram  03/2010    EF=>55%; LV mildly dilated; LA mod dilated; mild MR & TR; normal RVSP; mild pulm valve regurg  . Cardiac catheterization  05/17/2010    left main free of significant lesion; LAD with 60% lesion in mid segment & 60-70% lesion in mid-distal segment with subtotal occlusion at apex & small side branch of diagonal 1 that is subtotally occluded; ramus intermedius with 90% ostial stenosis; L Cfx w/mild luminal irreg; RCA is dominant vessel with diffuse disease, 50% prox lesion & 60% lesion in mid segment & 60% lesion in PDA  . Lateral epicondyle release Left 01/21/2014    Procedure: Left Tennis Elbow Release with debridement  tendon repair with reattachment;  Surgeon: SCarole Civil MD;  Location: [facility];  Service: Orthopedics;  Laterality: Left;  . Esophagogastroduodenoscopy N/A 07/28/2014    Procedure: ESOPHAGOGASTRODUODENOSCOPY (EGD);  Surgeon: NRogene Houston MD;  [facility];  Service: Endoscopy;  Laterality: N/A;  [place]  . Prostate surgery    . Penile implant    . Eye surgery Bilateral     lasik surgery   . Cataract extraction w/phaco Right 06/07/2015    Procedure: CATARACT EXTRACTION PHACO AND INTRAOCULAR LENS PLACEMENT (IOC);  Surgeon: RMarylynn Pearson MD;  [facility]  Service: Ophthalmology;  Laterality: Right;    Allergies  Allergen Reactions  . Ace Inhibitors   . Benadryl [Diphenhydramine Hcl]     Opposite reaction of medication purpose - hyper  . Benadryl [Diphenhydramine-Zinc Acetate]   . Vioxx [Rofecoxib] Other (See Comments)    headache    Current Outpatient Prescriptions on File Prior to Visit  Medication Sig Dispense Refill  . aspirin EC 81 MG tablet Take 162 mg by mouth at  bedtime.      . BUPROPION HBR ER PO Take 50 mg by mouth daily.    Marland Kitchen gabapentin (NEURONTIN) 300 MG capsule Take 300 mg by mouth 3 (three) times daily.    Marland Kitchen losartan (COZAAR) 100 MG tablet Take 100 mg by mouth daily.    Marland Kitchen omeprazole (PRILOSEC) 20 MG capsule Take 20 mg by mouth 2 (two) times daily.      . simvastatin (ZOCOR) 40 MG tablet Take 40 mg by mouth daily at 6 PM.    . traZODone (DESYREL) 100 MG tablet Take 100 mg by mouth at bedtime.     Marland Kitchen venlafaxine (EFFEXOR) 75 MG tablet Take 75 mg by mouth daily.    . vitamin B-12 (CYANOCOBALAMIN) 1000 MCG tablet Take 1,000 mcg by mouth daily. Reported on 06/18/2016    . prazosin (MINIPRESS) 5 MG capsule Take 5 mg by mouth at bedtime. Reported on 06/18/2016     No current facility-administered medications on file prior to visit.        Objective:   Physical Exam Blood pressure 106/72, pulse 64, temperature 97 F (36.1 C), height 5' 9.5" (1.765 m), weight 183 lb 3.2 oz (83.099 kg).   Alert and oriented. Skin warm and dry. Oral mucosa is moist.   . Sclera anicteric, conjunctivae is pink. Thyroid not enlarged. No cervical lymphadenopathy. Lungs clear. Heart regular rate and rhythm.  Abdomen is soft. Bowel sounds are positive. No hepatomegaly. No abdominal masses felt. No tenderness.  No edema to lower extremities.b      Assessment & Plan:  Solid food dysphagia. Sticture,webb needs to be ruled out.

## 2016-06-18 NOTE — Patient Instructions (Signed)
The risks and benefits such as perforation, bleeding, and infection were reviewed with the patient and is agreeable. 

## 2016-07-17 DIAGNOSIS — M722 Plantar fascial fibromatosis: Secondary | ICD-10-CM | POA: Diagnosis not present

## 2016-07-17 DIAGNOSIS — M79671 Pain in right foot: Secondary | ICD-10-CM | POA: Diagnosis not present

## 2016-08-07 DIAGNOSIS — M79671 Pain in right foot: Secondary | ICD-10-CM | POA: Diagnosis not present

## 2016-08-07 DIAGNOSIS — M722 Plantar fascial fibromatosis: Secondary | ICD-10-CM | POA: Diagnosis not present

## 2016-08-14 ENCOUNTER — Ambulatory Visit (HOSPITAL_COMMUNITY)
Admission: RE | Admit: 2016-08-14 | Discharge: 2016-08-14 | Disposition: A | Discharge status: Home / Self Care | Payer: Medicare HMO | Source: Ambulatory Visit | Attending: Internal Medicine | Admitting: Internal Medicine

## 2016-08-14 ENCOUNTER — Encounter (HOSPITAL_COMMUNITY)
Admission: RE | Disposition: A | Discharge status: Home / Self Care | Payer: Self-pay | Source: Ambulatory Visit | Attending: Internal Medicine

## 2016-08-14 ENCOUNTER — Encounter (HOSPITAL_COMMUNITY): Payer: Self-pay | Admitting: *Deleted

## 2016-08-14 DIAGNOSIS — Z7982 Long term (current) use of aspirin: Secondary | ICD-10-CM | POA: Insufficient documentation

## 2016-08-14 DIAGNOSIS — K2270 Barrett's esophagus without dysplasia: Secondary | ICD-10-CM | POA: Insufficient documentation

## 2016-08-14 DIAGNOSIS — F4310 Post-traumatic stress disorder, unspecified: Secondary | ICD-10-CM | POA: Insufficient documentation

## 2016-08-14 DIAGNOSIS — R1314 Dysphagia, pharyngoesophageal phase: Secondary | ICD-10-CM | POA: Diagnosis not present

## 2016-08-14 DIAGNOSIS — K449 Diaphragmatic hernia without obstruction or gangrene: Secondary | ICD-10-CM | POA: Insufficient documentation

## 2016-08-14 DIAGNOSIS — I2510 Atherosclerotic heart disease of native coronary artery without angina pectoris: Secondary | ICD-10-CM | POA: Insufficient documentation

## 2016-08-14 DIAGNOSIS — R1310 Dysphagia, unspecified: Secondary | ICD-10-CM | POA: Diagnosis not present

## 2016-08-14 DIAGNOSIS — Z955 Presence of coronary angioplasty implant and graft: Secondary | ICD-10-CM | POA: Insufficient documentation

## 2016-08-14 DIAGNOSIS — Z79899 Other long term (current) drug therapy: Secondary | ICD-10-CM | POA: Diagnosis not present

## 2016-08-14 DIAGNOSIS — K210 Gastro-esophageal reflux disease with esophagitis: Secondary | ICD-10-CM | POA: Insufficient documentation

## 2016-08-14 DIAGNOSIS — R69 Illness, unspecified: Secondary | ICD-10-CM | POA: Diagnosis not present

## 2016-08-14 DIAGNOSIS — I10 Essential (primary) hypertension: Secondary | ICD-10-CM | POA: Diagnosis not present

## 2016-08-14 HISTORY — PX: ESOPHAGEAL DILATION: SHX303

## 2016-08-14 HISTORY — PX: ESOPHAGOGASTRODUODENOSCOPY: SHX5428

## 2016-08-14 SURGERY — EGD (ESOPHAGOGASTRODUODENOSCOPY)
Anesthesia: Moderate Sedation

## 2016-08-14 MED ORDER — MIDAZOLAM HCL 5 MG/5ML IJ SOLN
INTRAMUSCULAR | Status: DC | PRN
  Administered 2016-08-14: 2 mg via INTRAVENOUS
  Administered 2016-08-14 (×2): 1 mg via INTRAVENOUS

## 2016-08-14 MED ORDER — SODIUM CHLORIDE 0.9 % IV SOLN
INTRAVENOUS | Status: DC
  Administered 2016-08-14: 1000 mL via INTRAVENOUS

## 2016-08-14 MED ORDER — BUTAMBEN-TETRACAINE-BENZOCAINE 2-2-14 % EX AERO
INHALATION_SPRAY | CUTANEOUS | Status: DC | PRN
  Administered 2016-08-14: 1 via TOPICAL

## 2016-08-14 MED ORDER — PANTOPRAZOLE SODIUM 40 MG PO TBEC: 40.0000 mg | DELAYED_RELEASE_TABLET | Freq: Two times a day (BID) | ORAL | 1 refills | Status: DC

## 2016-08-14 MED ORDER — MEPERIDINE HCL 50 MG/ML IJ SOLN
INTRAMUSCULAR | Status: AC
  Filled 2016-08-14: qty 1

## 2016-08-14 MED ORDER — MIDAZOLAM HCL 5 MG/5ML IJ SOLN
INTRAMUSCULAR | Status: AC
  Filled 2016-08-14: qty 10

## 2016-08-14 MED ORDER — MEPERIDINE HCL 50 MG/ML IJ SOLN
INTRAMUSCULAR | Status: DC | PRN
  Administered 2016-08-14 (×2): 25 mg via INTRAVENOUS

## 2016-08-14 NOTE — Discharge Instructions (Signed)
Esophagogastroduodenoscopy, Care After Refer to this sheet in the next few weeks. These instructions provide you with information about caring for yourself after your procedure. Your health care provider may also give you more specific instructions. Your treatment has been planned according to current medical practices, but problems sometimes occur. Call your health care provider if you have any problems or questions after your procedure. WHAT TO EXPECT AFTER THE PROCEDURE After your procedure, it is typical to feel:  Soreness in your throat.  Pain with swallowing.  Sick to your stomach (nauseous).  Bloated.  Dizzy.  Fatigued. HOME CARE INSTRUCTIONS  Do not eat or drink anything until the numbing medicine (local anesthetic) has worn off and your gag reflex has returned. You will know that the local anesthetic has worn off when you can swallow comfortably.  Do not drive or operate machinery until directed by your health care provider.  Take medicines only as directed by your health care provider. SEEK MEDICAL CARE IF:   You cannot stop coughing.  You are not urinating at all or less than usual. SEEK IMMEDIATE MEDICAL CARE IF:  You have difficulty swallowing.  You cannot eat or drink.  You have worsening throat or chest pain.  You have dizziness or lightheadedness or you faint.  You have nausea or vomiting.  You have chills.  You have a fever.  You have severe abdominal pain.  You have black, tarry, or bloody stools.   This information is not intended to replace advice given to you by your health care provider. Make sure you discuss any questions you have with your health care provider.   Document Released: 12/02/2012 Document Revised: 01/06/2015 Document Reviewed: 12/02/2012 Elsevier Interactive Patient Education 2016 [place] omeprazole. Begin pantoprazole 40 mg by mouth 30 minutes before breakfast and evening meal daily. Resume other  medications including aspirin as before. Resume usual diet. No driving for 24 hours. Office visit in 3 months.

## 2016-08-14 NOTE — H&P (Signed)
Cory [name] is an 70 y.o. male.   Chief Complaint: Patient is here for EGD and ED. HPI: Patient is 70 year old Caucasian male was chronic GERD complicated by short segment Barrett's esophagus whose last surveillance EGD was in July 2015 now presents with several week history of dysphagia with solids and pills. He has most difficulty in the evening. He feels heartburn is well controlled with PPI and dietary measures. He denies anorexia weight loss or melena.  Past Medical History:  Diagnosis Date  . Anemia   . Arthritis   . Coronary artery disease    stent to 1st diagonal, mod disease of ramus intermedius in the RCA  . GERD (gastroesophageal reflux disease)   . History of nuclear stress test 03/2010   bruce myoview; mild ischemia in basal anterior & mid anterior region; low risk   . Hyperlipidemia   . Hypertension   . Pneumonia   . PTSD (post-traumatic stress disorder)     Past Surgical History:  Procedure Laterality Date  . CARDIAC CATHETERIZATION  05/17/2010   left main free of significant lesion; LAD with 60% lesion in mid segment & 60-70% lesion in mid-distal segment with subtotal occlusion at apex & small side branch of diagonal 1 that is subtotally occluded; ramus intermedius with 90% ostial stenosis; L Cfx w/mild luminal irreg; RCA is dominant vessel with diffuse disease, 50% prox lesion & 60% lesion in mid segment & 60% lesion in PDA  . CATARACT EXTRACTION W/PHACO Right 06/07/2015   Procedure: CATARACT EXTRACTION PHACO AND INTRAOCULAR LENS PLACEMENT (IOC);  Surgeon: Marylynn Pearson, MD;  Location: [facility];  Service: Ophthalmology;  Laterality: Right;  . COLONOSCOPY  02/25/2012   Procedure: COLONOSCOPY;  Surgeon: Jamesetta So, MD;  [facility];  Service: Gastroenterology;  Laterality: N/A;  . CORONARY ANGIOPLASTY WITH STENT PLACEMENT  09/2001   Taxus (2.5x2mm) stent to 1st diagonal   . ESOPHAGOGASTRODUODENOSCOPY  02/25/2012   Procedure: ESOPHAGOGASTRODUODENOSCOPY (EGD);   Surgeon: Jamesetta So, MD;  [facility];  Service: Gastroenterology;  Laterality: N/A;  . ESOPHAGOGASTRODUODENOSCOPY N/A 07/28/2014   Procedure: ESOPHAGOGASTRODUODENOSCOPY (EGD);  Surgeon: Rogene Houston, MD;  [facility];  Service: Endoscopy;  Laterality: N/A;  200-rescheduled to [place] notified pt  . EYE SURGERY Bilateral    lasik surgery   . KNEE SURGERY    . LATERAL EPICONDYLE RELEASE Left 01/21/2014   Procedure: Left Tennis Elbow Release with debridement  tendon repair with reattachment;  Surgeon: Carole Civil, MD;  Location: [facility];  Service: Orthopedics;  Laterality: Left;  . penile implant    . PROSTATE SURGERY    . SHOULDER SURGERY    . TONSILLECTOMY    . TRANSTHORACIC ECHOCARDIOGRAM  03/2010   EF=>55%; LV mildly dilated; LA mod dilated; mild MR & TR; normal RVSP; mild pulm valve regurg    History reviewed. No pertinent family history. Social History:  reports that he has never smoked. He has never used smokeless tobacco. He reports that he does not drink alcohol or use drugs.  Allergies:  Allergies  Allergen Reactions  . Ace Inhibitors   . Benadryl [Diphenhydramine Hcl]     Opposite reaction of medication purpose - hyper  . Benadryl [Diphenhydramine-Zinc Acetate]   . Vioxx [Rofecoxib] Other (See Comments)    headache    Medications Prior to Admission  Medication Sig Dispense Refill  . BUPROPION HBR ER PO Take 50 mg by mouth daily.    Marland Kitchen gabapentin (NEURONTIN)  300 MG capsule Take 300 mg by mouth 3 (three) times daily.    Marland Kitchen losartan (COZAAR) 100 MG tablet Take 100 mg by mouth daily.    Marland Kitchen omeprazole (PRILOSEC) 20 MG capsule Take 20 mg by mouth 2 (two) times daily.      . prazosin (MINIPRESS) 5 MG capsule Take 5 mg by mouth at bedtime. Reported on 06/18/2016    . simvastatin (ZOCOR) 40 MG tablet Take 40 mg by mouth daily at 6 PM.    . traZODone (DESYREL) 100 MG tablet Take 100 mg by mouth at bedtime.     Marland Kitchen venlafaxine (EFFEXOR) 75 MG tablet  Take 75 mg by mouth daily.    . vitamin B-12 (CYANOCOBALAMIN) 1000 MCG tablet Take 1,000 mcg by mouth daily. Reported on 06/18/2016    . aspirin EC 81 MG tablet Take 162 mg by mouth at bedtime.        No results found for this or any previous visit (from the past 48 hour(s)). No results found.  ROS  Blood pressure (!) 143/90, pulse (!) 58, temperature 97.7 F (36.5 C), temperature source Oral, resp. rate 18, height 5' 9.75" (1.772 m), weight 175 lb (79.4 kg), SpO2 97 %. Physical Exam  Constitutional: He appears well-developed and well-nourished.  HENT:  Mouth/Throat: Oropharynx is clear and moist.  Evidence of uvulectomy.  Eyes: Conjunctivae are normal. No scleral icterus.  Neck: No thyromegaly present.  Cardiovascular: Normal rate, regular rhythm and normal heart sounds.   No murmur heard. Respiratory: Effort normal and breath sounds normal.  GI: Soft. He exhibits no distension and no mass. There is no tenderness.  Musculoskeletal: He exhibits no edema.  Lymphadenopathy:    He has no cervical adenopathy.  Neurological: He is alert.  Skin: Skin is warm and dry.     Assessment/Plan Dysphagia to solids and pills. Patient with chronic GERD and short segment Barrett's esophagus. EGD with ED.  Hildred Laser, MD 08/14/2016, 12:52 PM

## 2016-08-14 NOTE — Op Note (Signed)
[facility] Patient Name: Cory [name] Procedure Date: 08/14/2016 12:54 PM MRN: [id number] Date of Birth: 29-Nov-1946 Attending MD: Hildred Laser , MD CSN: [id number] Age: 70 Admit Type: Outpatient Procedure:                Upper GI endoscopy Indications:              Esophageal dysphagia, Reflux esophagitis, Barrett's                            esophagus Providers:                Hildred Laser, MD, Hinton Rao, RN, Purcell Nails.                            Tina Griffiths, Technician Referring MD:             Halford Chessman, MD Medicines:                Cetacaine spray, Meperidine 50 mg IV, Midazolam 4                            mg IV Complications:            No immediate complications. Estimated Blood Loss:     Estimated blood loss: none. Procedure:                Pre-Anesthesia Assessment:                           - Prior to the procedure, a History and Physical                            was performed, and patient medications and                            allergies were reviewed. The patient's tolerance of                            previous anesthesia was also reviewed. The risks                            and benefits of the procedure and the sedation                            options and risks were discussed with the patient.                            All questions were answered, and informed consent                            was obtained. Prior Anticoagulants: The patient                            last took aspirin 3 days prior to the procedure.  ASA Grade Assessment: III - A patient with severe                            systemic disease. After reviewing the risks and                            benefits, the patient was deemed in satisfactory                            condition to undergo the procedure.                           After obtaining informed consent, the endoscope was                            passed under direct vision. Throughout  the                            procedure, the patient's blood pressure, pulse, and                            oxygen saturations were monitored continuously. The                            [id number]) scope was introduced through the                            mouth, and advanced to the second part of duodenum.                            The upper GI endoscopy was accomplished without                            difficulty. The patient tolerated the procedure                            well. Scope In: 1:04:04 PM Scope Out: 1:17:42 PM Total Procedure Duration: 0 hours 13 minutes 38 seconds  Findings:      LA Grade B (one or more mucosal breaks greater than 5 mm, not extending       between the tops of two mucosal folds) esophagitis with no bleeding was       found [cm from incisors].      There were esophageal mucosal changes secondary to established       short-segment Barrett's disease present in the distal esophagus. The       maximum longitudinal extent of these mucosal changes was 2 cm in length.      The Z-line was irregular and was found 38 cm from the incisors.      No endoscopic abnormality was evident in the esophagus to explain the       patient's complaint of dysphagia. It was decided, however, to proceed       with dilation of the entire esophagus. The scope was withdrawn. Dilation       was performed with a Maloney dilator with no  resistance at 54 Fr. The       dilation site was examined and showed no change.      A 2 cm hiatal hernia was present.      The entire examined stomach was normal.      The duodenal bulb and second portion of the duodenum were normal. Impression:               - LA Grade B reflux esophagitis.                           - Esophageal mucosal changes secondary to                            established short-segment Barrett's disease.                           - Z-line irregular, 38 cm from the incisors.                           - No endoscopic  esophageal abnormality to explain                            patient's dysphagia. Esophagus dilated. Dilated.                           - 2 cm hiatal hernia.                           - Normal stomach.                           - Normal duodenal bulb and second portion of the                            duodenum.                           - No specimens collected. Moderate Sedation:      Moderate (conscious) sedation was administered by the endoscopy nurse       and supervised by the endoscopist. The following parameters were       monitored: oxygen saturation, heart rate, blood pressure, CO2       capnography and response to care. Total physician intraservice time was       19 minutes. Recommendation:           - Patient has a contact number available for                            emergencies. The signs and symptoms of potential                            delayed complications were discussed with the                            patient. Return to normal activities tomorrow.  Written discharge instructions were provided to the                            patient.                           - Resume previous diet today.                           - Discontinue Omeprazole today.                           - Continue present medications.                           - Use Protonix (pantoprazole) 40 mg PO BID today.                           - Return to GI clinic in 3 months. Procedure Code(s):        --- Professional ---                           [id number], Esophagogastroduodenoscopy, flexible,                            transoral; diagnostic, including collection of                            specimen(s) by brushing or washing, when performed                            (separate procedure)                           43450, Dilation of esophagus, by unguided sound or                            bougie, single or multiple passes                           99152, Moderate  sedation services provided by the                            same physician or other qualified health care                            professional performing the diagnostic or                            therapeutic service that the sedation supports,                            requiring the presence of an independent trained                            observer to assist in the monitoring of the  patient's level of consciousness and physiological                            status; initial 15 minutes of intraservice time,                            patient age 52 years or older Diagnosis Code(s):        --- Professional ---                           K21.0, Gastro-esophageal reflux disease with                            esophagitis                           K22.70, Barrett's esophagus without dysplasia                           K22.8, Other specified diseases of esophagus                           K44.9, Diaphragmatic hernia without obstruction or                            gangrene                           R13.14, Dysphagia, pharyngoesophageal phase CPT copyright 2016 American Medical Association. All rights reserved. The codes documented in this report are preliminary and upon coder review may  be revised to meet current compliance requirements. Hildred Laser, MD Hildred Laser, MD 08/14/2016 1:36:08 PM This report has been signed electronically. Number of Addenda: 0

## 2016-08-16 ENCOUNTER — Encounter (HOSPITAL_COMMUNITY): Payer: Self-pay | Admitting: Internal Medicine

## 2016-08-29 DIAGNOSIS — M722 Plantar fascial fibromatosis: Secondary | ICD-10-CM | POA: Diagnosis not present

## 2016-08-29 DIAGNOSIS — M79671 Pain in right foot: Secondary | ICD-10-CM | POA: Diagnosis not present

## 2016-09-23 ENCOUNTER — Ambulatory Visit (INDEPENDENT_AMBULATORY_CARE_PROVIDER_SITE_OTHER): Payer: PPO | Admitting: Internal Medicine

## 2016-10-02 DIAGNOSIS — I213 ST elevation (STEMI) myocardial infarction of unspecified site: Secondary | ICD-10-CM | POA: Diagnosis not present

## 2016-10-09 DIAGNOSIS — E663 Overweight: Secondary | ICD-10-CM | POA: Diagnosis not present

## 2016-10-09 DIAGNOSIS — I2510 Atherosclerotic heart disease of native coronary artery without angina pectoris: Secondary | ICD-10-CM | POA: Diagnosis not present

## 2016-10-09 DIAGNOSIS — I219 Acute myocardial infarction, unspecified: Secondary | ICD-10-CM | POA: Diagnosis not present

## 2016-10-09 DIAGNOSIS — Z6827 Body mass index (BMI) 27.0-27.9, adult: Secondary | ICD-10-CM | POA: Diagnosis not present

## 2016-10-22 ENCOUNTER — Encounter: Payer: Self-pay | Admitting: Nurse Practitioner

## 2016-10-22 ENCOUNTER — Ambulatory Visit (INDEPENDENT_AMBULATORY_CARE_PROVIDER_SITE_OTHER): Payer: Medicare HMO | Admitting: Nurse Practitioner

## 2016-10-22 VITALS — BP 126/70 | HR 41 | Ht 69.0 in | Wt 183.8 lb

## 2016-10-22 DIAGNOSIS — R001 Bradycardia, unspecified: Secondary | ICD-10-CM

## 2016-10-22 DIAGNOSIS — I2119 ST elevation (STEMI) myocardial infarction involving other coronary artery of inferior wall: Secondary | ICD-10-CM | POA: Diagnosis not present

## 2016-10-22 DIAGNOSIS — E782 Mixed hyperlipidemia: Secondary | ICD-10-CM | POA: Diagnosis not present

## 2016-10-22 DIAGNOSIS — I119 Hypertensive heart disease without heart failure: Secondary | ICD-10-CM

## 2016-10-22 DIAGNOSIS — I25110 Atherosclerotic heart disease of native coronary artery with unstable angina pectoris: Secondary | ICD-10-CM | POA: Diagnosis not present

## 2016-10-22 MED ORDER — ATORVASTATIN CALCIUM 80 MG PO TABS: 80.0000 mg | ORAL_TABLET | Freq: Every day | ORAL | 1 refills | Status: DC

## 2016-10-22 NOTE — Progress Notes (Signed)
Office Visit    Patient Name: Cory [name] Date of Encounter: 10/22/2016  Primary Care Provider:  Purvis Kilts, MD Primary Cardiologist:  C. Hilty, MD   Chief Complaint    70 year old male with prior history of CAD status post bare metal stenting of the first diagonal in 2002 who presents for follow-up after recent inferior ST elevation MI.  Past Medical History    Past Medical History:  Diagnosis Date  . Anemia   . Arthritis   . Coronary artery disease    a. 09/2001 PCI/BMS to D1: 2.5 x 15 Express 2 BMS;  b. 2011 Cath: stable anatomy; c. 03/2014 Low risk MV, EF 54%;  d. 09/2016 Inf STEMI/PCI [facility]): LM nl, LAD 81m D1 nl w/ 90% in 163minf branch, LCX nl, RCA 99 (Synergy DES x 4 - 3.5x28 prox, 3.5x3874m.5x16d, 2.25x16 to RPDA), EF 55%.  . GMarland KitchenRD (gastroesophageal reflux disease)   . Hyperlipidemia   . Hypertensive heart disease   . Pneumonia   . PTSD (post-traumatic stress disorder)    Past Surgical History:  Procedure Laterality Date  . CARDIAC CATHETERIZATION  05/17/2010   left main free of significant lesion; LAD with 60% lesion in mid segment & 60-70% lesion in mid-distal segment with subtotal occlusion at apex & small side branch of diagonal 1 that is subtotally occluded; ramus intermedius with 90% ostial stenosis; L Cfx w/mild luminal irreg; RCA is dominant vessel with diffuse disease, 50% prox lesion & 60% lesion in mid segment & 60% lesion in PDA  . CATARACT EXTRACTION W/PHACO Right 06/07/2015   Procedure: CATARACT EXTRACTION PHACO AND INTRAOCULAR LENS PLACEMENT (IOC);  Surgeon: RoyMarylynn PearsonD;  [facility];  Laterality: Right;  . COLONOSCOPY  02/25/2012   Procedure: COLONOSCOPY;  Surgeon: MarJamesetta SoD;  [facility];  Service: Gastroenterology;  Laterality: N/A;  . CORONARY ANGIOPLASTY WITH STENT PLACEMENT  09/2001   Taxus (2.5x15m70mtent to 1st diagonal   . ESOPHAGEAL DILATION N/A 08/14/2016     Procedure: ESOPHAGEAL DILATION;  Surgeon: NajeRogene Houston;  [facility];  Service: Endoscopy;  Laterality: N/A;  . ESOPHAGOGASTRODUODENOSCOPY  02/25/2012   Procedure: ESOPHAGOGASTRODUODENOSCOPY (EGD);  Surgeon: MarkJamesetta So;  [facility];  Service: Gastroenterology;  Laterality: N/A;  . ESOPHAGOGASTRODUODENOSCOPY N/A 07/28/2014   Procedure: ESOPHAGOGASTRODUODENOSCOPY (EGD);  Surgeon: NajeRogene Houston;  [facility];  Service: Endoscopy;  Laterality: N/A;  200-[place] [address] [place] [address]  . ESOPHAGOGASTRODUODENOSCOPY N/A 08/14/2016   Procedure: ESOPHAGOGASTRODUODENOSCOPY (EGD);  Surgeon: NajeRogene Houston;  [facility];  Service: Endoscopy;  Laterality: N/A;  100  . EYE SURGERY Bilateral    lasik surgery   . KNEE SURGERY    . LATERAL EPICONDYLE RELEASE Left 01/21/2014   Procedure: Left Tennis Elbow Release with debridement  tendon repair with reattachment;  Surgeon: StanCarole Civil;  Location: [facility];  Service: Orthopedics;  Laterality: Left;  . penile implant    . PROSTATE SURGERY    . SHOULDER SURGERY    . TONSILLECTOMY    . TRANSTHORACIC ECHOCARDIOGRAM  03/2010   EF=>55%; LV mildly dilated; LA mod dilated; mild MR & TR; normal RVSP; mild pulm valve regurg    Allergies  Allergies  Allergen Reactions  . Ace Inhibitors   . Benadryl [Diphenhydramine Hcl]     Opposite reaction of medication purpose - hyper  .  Benadryl [Diphenhydramine-Zinc Acetate]   . Vioxx [Rofecoxib] Other (See Comments)    headache    History of Present Illness    70 year old male with prior history of coronary artery disease status post bare metal stenting of the first diagonal in 2002. He also has a history of hypertension, hyperlipidemia, GERD, arthritis, and PTSD. He was last seen in clinic by Dr. Debara Pickett in 2015. He did well following that visit but on October 4, he and his wife were in [place] when he developed severe chest pain. He  initially presented to a small [facility] and apparently was noted to have a markedly abnormal EKG and was subsequently transferred to [facility] in [facility]. There, he underwent emergent catheterization with findings of a subtotal occlusion of the right coronary artery. 4 drug-eluting stents were placed. Post procedure course was uneventful and he was discharged home on aspirin, Effient, atenolol, and simvastatin 20-which was his preadmission dose. Since discharge, he has done quite well. He has not been having any chest pain or dyspnea and he is interested in enrolling in cardiac rehabilitation up at [facility].  Current Medications      Medication Sig Start Date End Date Taking? Authorizing Provider  aspirin 81 MG tablet Take 81 mg by mouth daily.   Yes Historical Provider, MD  BUPROPION HBR ER PO Take 50 mg by mouth daily.   Yes Historical Provider, MD  EFFIENT 10 MG TABS tablet Take 10 mg by mouth daily. 10/03/16  Yes Historical Provider, MD  gabapentin (NEURONTIN) 300 MG capsule Take 300 mg by mouth 2 (two) times daily.   Yes Historical Provider, MD  losartan (COZAAR) 100 MG tablet Take 100 mg by mouth daily.   Yes Historical Provider, MD  pantoprazole (PROTONIX) 40 MG tablet Take 1 tablet (40 mg total) by mouth 2 (two) times daily before a meal. 08/14/16  Yes Rogene Houston, MD  traZODone (DESYREL) 100 MG tablet Take 100 mg by mouth at bedtime.    Yes Historical Provider, MD  venlafaxine (EFFEXOR) 75 MG tablet Take 75 mg by mouth daily.   Yes Historical Provider, MD  vitamin B-12 (CYANOCOBALAMIN) 1000 MCG tablet Take 1,000 mcg by mouth daily. Reported on 06/18/2016   Yes Historical Provider, MD  atorvastatin (LIPITOR) 80 MG tablet Take 1 tablet (80 mg total) by mouth daily. 10/22/16 01/20/17  Rogelia Mire, NP    Review of Systems   As above, he has been doing well post MI.   He denies chest pain, palpitations, dyspnea, pnd, orthopnea, n, v, dizziness, syncope, edema, weight  gain, or early satiety.  All other systems reviewed and are otherwise negative except as noted above.  Physical Exam    VS:  BP 126/70   Pulse (!) 41   Ht '5\' 9"'$  (1.753 m)   Wt 183 lb 12.8 oz (83.4 kg)   BMI 27.14 kg/m  , BMI Body mass index is 27.14 kg/m. GEN: Well nourished, well developed, in no acute distress.  HEENT: normal.  Neck: Supple, no JVD, carotid bruits, or masses. Cardiac: RRR, no murmurs, rubs, or gallops. No clubbing, cyanosis, edema.  Radials/DP/PT 2+ and equal bilaterally. Right groin catheterization site without bleeding, bruit, or hematoma.  Respiratory:  Respirations regular and unlabored, clear to auscultation bilaterally. GI: Soft, nontender, nondistended, BS + x 4. MS: no deformity or atrophy. Skin: warm and dry, no rash. Neuro:  Strength and sensation are intact. Psych: Normal affect.  Accessory Clinical Findings  ECG - sinus bradycardia, 41, inferior infarct with inferior T-wave inversion.   Assessment & Plan    1.  inferior ST segment elevation myocardial infarction, subsequent care/coronary artery disease: Patient was recently admitted to grand [facility] secondary to chest pain and inferior ST segment elevation. He underwent emergent diagnostic catheterization revealing a subtotal occlusion of the RCA. This was successfully treated with 4 drug-eluting stents. He was placed on aspirin and Effient as well as beta blocker therapy at discharge. LV function was normal. He has been feeling well since discharge without chest pain or dyspnea. His heart rate today is 41. There is no evidence of high-grade heart block. He has not been having presyncope or syncope. I have asked him to discontinue atenolol therapy. He is interested in enrollment in cardiac rehabilitation and I will make a referral to [facility].  2. Hypertensive heart disease: Blood pressure is currently stable. He will remain on ARB therapy. I'm discontinuing beta blocker in the  setting of sinus bradycardia with heart rate of 41.  3. Hyperlipidemia: I do not have recent lipids on him.  He has chronically been treated with simvastatin 20 mg. In the setting of recent acute coronary syndrome, I will discontinue simvastatin and prescribe Lipitor 80 mg daily. Follow-up lipids and LFTs in 6 weeks.  4. Sinus bradycardia: Heart rate is only 41 on beta blocker therapy. Atenolol was started just 3 weeks ago. I've asked him to discontinue beta blocker.   5. Disposition: The patient has been doing well post inferior MI. Follow-up in clinic in 3 months or sooner if necessary. He also plans to follow-up with cardiology at the [place] in [place].  Murray Hodgkins, NP 10/22/2016, 3:54 PM

## 2016-10-22 NOTE — Patient Instructions (Signed)
Medication Instructions:  STOP ATENOLOL '25MG'$   STOP SIMVASTATIN '20MG'$  START LIPITOR '80MG'$  DAILY   Labwork: LIPID AND LIVER FUNCTION TEST IN IN 6 WEEKS  Testing/Procedures: You have been referred to [place], SOMEONE WILL BE CONTACTING YOU TO SCHEDULE THIS APPOINTMENT  Follow-Up: Your physician recommends that you schedule a follow-up appointment in: 3 MONTHS WITH DR HILTY  Any Other Special Instructions Will Be Listed Below (If Applicable).  Cardiac Rehabilitation Cardiac rehabilitation is a medically supervised program that helps improve the health and well-being of people with heart problems. Cardiac rehabilitation includes exercise training, education, and counseling to help you get stronger and return to an active lifestyle. People who participate in cardiac rehabilitation programs get better faster and reduce future hospital stays. Cardiac rehabilitation programs can help when you have had the following conditions:  Heart attack.  Heart failure.  Peripheral artery disease.  Coronary artery disease.  Angina.  Lung or breathing problems. Cardiac rehabilitation programs are also used when you have the following procedures:  Coronary artery bypass graft surgery.  Heart valve replacement.  Heart stent placement.  Heart transplant.  Aneurysm repair. CARDIAC REHABILITATION MAY HELP YOU:  Reduce problems like chest pain and trouble breathing.  Change risk factors that contribute to heart disease, such as:  Smoking.  High blood pressure.  High cholesterol.  Diabetes.  Being out of shape or not active.  Weighing more than 30% over your ideal weight.  Diet.  Improve your mental outlook so you feel:  Less depressed or "blue."  More hopeful.  Better about yourself.  More confident about taking care of yourself.  Get support from health experts as well as other people with similar problems.  Learn how to manage and  understand your medicines.  Teach your family about your condition and how to participate in your recovery. WHAT HAPPENS IN CARDIAC REHABILITATION? You will be assessed by a cardiac rehabilitation team. They will check your health history and do a physical exam. You may need blood tests, stress tests, and other evaluations. You may not start a cardiac rehabilitation program if:  You develop angina with exercise or while at rest.  You have severe heart failure that limits your activity.  You have an abnormal heart rhythm at rest.  You develop heart rhythm problems during exercise.  You have high blood pressure that is not controlled. The cardiac rehabilitation team works with you to make a plan based on your health and goals. Everyone is unique, so each program is customized and your program may change as you progress. Members of a typical cardiac rehabilitation team may include such health professionals as:  Doctors.  Nurses.  Dietitians.  Psychologists.  Exercise specialists.  Physical and occupational therapists. A typical cardiac rehabilitation program is divided into phases. You advance from one phase to the next. Most cardiac rehabilitation sessions last for 60 minutes, 3 times a week.  Phase One starts while you are still in the hospital. You may start by walking in your room and then in the hall. You may start some simple exercises with a therapist. Health care team members will give you information and ask you many questions. You may not be able to remember details, so have a family member or an advocate with you to help keep track of information.  Phase Two begins when you go home or to another facility. This phase may last 8 to 12 weeks. You will travel to a cardiac rehabilitation center or a  place where it is offered. Typically, you gradually increase your activity while being closely watched by a nurse or therapist. Exercises may be a combination of strength or resistance  training and "cardio" or aerobic movement on a treadmill or other machines. Your condition will determine how often and how long these sessions will last.  In phase two, you may learn how to cook healthy meals, control your blood sugar, and manage your medicines. You may need help with scheduling or planning how and when to take your medicines. Use a timer, divided pill box, or follow a form to make taking your medicines easier. Use the method that works best for you. Some medicines should not be taken with certain foods. If you take more than one blood pressure medicine, you may need to stagger the times you take them. Taking all your blood pressure medicine at the same time may lower your blood pressure too much. If you have questions about your medicines, ask your health care provider questions until you understand.  Phase Three continues for the rest of your life. There will be less supervision. You may still participate in cardiac rehabilitation activities or become part of a group in your community. You may benefit from talking to other people about your experience if they are facing similar challenges. How soon you drive, have sex, or return to work will depend on your condition. These decisions should be made by you and your health care provider. If you need help, ask for it. Find out where you can get the help you need. Ask questions until you get answers and understand. SEEK IMMEDIATE MEDICAL CARE IF:  Get medical help at once if you experience any of the following symptoms:  Severe chest discomfort, especially if the pain is crushing or pressure-like and spreads to the arms, back, neck, or jaw. Do not wait to see if the pain will go away.  Weakness or numbness in your face, arms, or legs, especially on one side of the body; slurred speech; confusion; sudden severe headache or loss of vision (all symptoms of stroke).  You have shortness of breath.  You are sweating and feel sick to your  stomach (nausea).  You feel dizzy or faint.  You experience profound tiredness (fatigue). Call your local emergency service (911 in the [place] Do not drive yourself to the hospital.   This information is not intended to replace advice given to you by your health care provider. Make sure you discuss any questions you have with your health care provider.   Document Released: 09/24/2008 Document Revised: 01/06/2015 Document Reviewed: 03/22/2011 Elsevier Interactive Patient Education [employer].      If you need a refill on your cardiac medications before your next appointment, please call your pharmacy.

## 2016-11-01 ENCOUNTER — Telehealth: Payer: Self-pay | Admitting: Nurse Practitioner

## 2016-11-01 DIAGNOSIS — I213 ST elevation (STEMI) myocardial infarction of unspecified site: Secondary | ICD-10-CM

## 2016-11-01 NOTE — Telephone Encounter (Signed)
Spoke to [place]. She notes she is working on enrollment for patient to cardiac rehab and acknowledged there is an order for cardiac rehab cosigned by Ignacia Bayley. States this will actually require MD to cosign. Requested Dr. Lysbeth Penner signature as he is primary cardiologist. I have informed her Dr. Debara Pickett out of office for this and next week, but I would route to covering physician to review. She voiced thanks and stated no further concerns or needs identified at this time.

## 2016-11-01 NOTE — Telephone Encounter (Signed)
New Message:    Please call Cory [name] at [place] pt Cory [name] please.

## 2016-11-02 NOTE — Telephone Encounter (Signed)
I will be glad to sign his order for cardiac rehab

## 2016-11-04 NOTE — Telephone Encounter (Signed)
Left msg for Diane concerning request and to call if questions.

## 2016-11-04 NOTE — Telephone Encounter (Signed)
Cory [name] states referral in epic, just awaiting signature.

## 2016-11-04 NOTE — Telephone Encounter (Signed)
I think Dr. Debara Pickett may have taken care of this, but please confirm. MCr

## 2016-11-05 NOTE — Telephone Encounter (Signed)
I've put in a new order under Dr. Debara Pickett. Unsure how to cancel the old one but that one was ordered under [employer] name.

## 2016-11-05 NOTE — Telephone Encounter (Signed)
Spoke to Cardiac Rehab at [facility], receipt of order acknowledged.

## 2016-11-05 NOTE — Telephone Encounter (Signed)
Signed sealed and delivered

## 2016-11-18 ENCOUNTER — Ambulatory Visit (INDEPENDENT_AMBULATORY_CARE_PROVIDER_SITE_OTHER): Payer: Medicare HMO | Admitting: Internal Medicine

## 2016-11-18 ENCOUNTER — Encounter (INDEPENDENT_AMBULATORY_CARE_PROVIDER_SITE_OTHER): Payer: Self-pay | Admitting: Internal Medicine

## 2016-11-18 VITALS — BP 124/80 | HR 76 | Temp 98.0°F | Ht 69.75 in | Wt 186.1 lb

## 2016-11-18 DIAGNOSIS — J029 Acute pharyngitis, unspecified: Secondary | ICD-10-CM

## 2016-11-18 DIAGNOSIS — K219 Gastro-esophageal reflux disease without esophagitis: Secondary | ICD-10-CM | POA: Diagnosis not present

## 2016-11-18 DIAGNOSIS — K2270 Barrett's esophagus without dysplasia: Secondary | ICD-10-CM

## 2016-11-18 MED ORDER — AMOXICILLIN 500 MG PO CAPS: 500.0000 mg | ORAL_CAPSULE | Freq: Three times a day (TID) | ORAL | 0 refills | Status: DC

## 2016-11-18 NOTE — Patient Instructions (Signed)
Continue the Protonix  OV 1 year.  

## 2016-11-18 NOTE — Progress Notes (Addendum)
Subjective:    Patient ID: Cory [name], male    DOB: 09-Oct-1946, 70 y.o.   MRN: [id number]  HPI Here today for f/u after undergoing an EGD/ED in August for dysphagia.  He tells me he is not having any dysphagia. Acid reflux os controlled with Protonix. Hx of Barrett's esophagus.  States he has a sorethroat x 2 weeks. He denies any fever. Has been using Mucinex DM.  Recent hx of MI om Pctpber   08/14/2016 EGD/ED: dysphagia. Reflux, Barrett's esophagus:  Impression:               - LA Grade B reflux esophagitis.                           - Esophageal mucosal changes secondary to                            established short-segment Barrett's disease.                           - Z-line irregular, 38 cm from the incisors.                           - No endoscopic esophageal abnormality to explain                            patient's dysphagia. Esophagus dilated. Dilated.                           - 2 cm hiatal hernia.                           - Normal stomach.                           - Normal duodenal bulb and second portion of the                            duodenum.  Review of Systems Past Medical History:  Diagnosis Date  . Anemia   . Arthritis   . Coronary artery disease    a. 09/2001 PCI/BMS to D1: 2.5 x 15 Express 2 BMS;  b. 2011 Cath: stable anatomy; c. 03/2014 Low risk MV, EF 54%;  d. 09/2016 Inf STEMI/PCI [facility]): LM nl, LAD 90m, D1 nl w/ 90% in 53mm inf branch, LCX nl, RCA 99 (Synergy DES x 4 - 3.5x28 prox, 3.5x19m, 2.5x16d, 2.25x16 to RPDA), EF 55%.  Marland Kitchen GERD (gastroesophageal reflux disease)   . Hyperlipidemia   . Hypertensive heart disease   . Pneumonia   . PTSD (post-traumatic stress disorder)     Past Surgical History:  Procedure Laterality Date  . CARDIAC CATHETERIZATION  05/17/2010   left main free of significant lesion; LAD with 60% lesion in mid segment & 60-70% lesion in mid-distal segment with subtotal occlusion at apex & small side  branch of diagonal 1 that is subtotally occluded; ramus intermedius with 90% ostial stenosis; L Cfx w/mild luminal irreg; RCA is dominant vessel with diffuse disease, 50% prox lesion & 60% lesion in mid segment & 60% lesion in PDA  .  CATARACT EXTRACTION W/PHACO Right 06/07/2015   Procedure: CATARACT EXTRACTION PHACO AND INTRAOCULAR LENS PLACEMENT (IOC);  Surgeon: Marylynn Pearson, MD;  Location: [place];  Service: Ophthalmology;  Laterality: Right;  . COLONOSCOPY  02/25/2012   Procedure: COLONOSCOPY;  Surgeon: Jamesetta So, MD;  [facility];  Service: Gastroenterology;  Laterality: N/A;  . CORONARY ANGIOPLASTY WITH STENT PLACEMENT  09/2001   Taxus (2.5x26mm) stent to 1st diagonal   . ESOPHAGEAL DILATION N/A 08/14/2016   Procedure: ESOPHAGEAL DILATION;  Surgeon: Rogene Houston, MD;  [facility];  Service: Endoscopy;  Laterality: N/A;  . ESOPHAGOGASTRODUODENOSCOPY  02/25/2012   Procedure: ESOPHAGOGASTRODUODENOSCOPY (EGD);  Surgeon: Jamesetta So, MD;  [facility];  Service: Gastroenterology;  Laterality: N/A;  . ESOPHAGOGASTRODUODENOSCOPY N/A 07/28/2014   Procedure: ESOPHAGOGASTRODUODENOSCOPY (EGD);  Surgeon: Rogene Houston, MD;  [facility];  Service: Endoscopy;  Laterality: N/A;  200-rescheduled to [place] notified pt  . ESOPHAGOGASTRODUODENOSCOPY N/A 08/14/2016   Procedure: ESOPHAGOGASTRODUODENOSCOPY (EGD);  Surgeon: Rogene Houston, MD;  [facility];  Service: Endoscopy;  Laterality: N/A;  100  . EYE SURGERY Bilateral    lasik surgery   . KNEE SURGERY    . LATERAL EPICONDYLE RELEASE Left 01/21/2014   Procedure: Left Tennis Elbow Release with debridement  tendon repair with reattachment;  Surgeon: Carole Civil, MD;  [facility]: [facility];  Service: Orthopedics;  Laterality: Left;  . penile implant    . PROSTATE SURGERY    . SHOULDER SURGERY    . TONSILLECTOMY    . TRANSTHORACIC ECHOCARDIOGRAM  03/2010   EF=>55%; LV mildly dilated; LA mod  dilated; mild MR & TR; normal RVSP; mild pulm valve regurg    Allergies  Allergen Reactions  . Ace Inhibitors   . Benadryl [Diphenhydramine Hcl]     Opposite reaction of medication purpose - hyper  . Benadryl [Diphenhydramine-Zinc Acetate]   . Vioxx [Rofecoxib] Other (See Comments)    headache    Current Outpatient Prescriptions on File Prior to Visit  Medication Sig Dispense Refill  . aspirin 81 MG tablet Take 81 mg by mouth daily.    Marland Kitchen atorvastatin (LIPITOR) 80 MG tablet Take 1 tablet (80 mg total) by mouth daily. 30 tablet 1  . BUPROPION HBR ER PO Take 50 mg by mouth daily.    Marland Kitchen EFFIENT 10 MG TABS tablet Take 10 mg by mouth daily.    Marland Kitchen gabapentin (NEURONTIN) 300 MG capsule Take 300 mg by mouth 2 (two) times daily.    Marland Kitchen losartan (COZAAR) 100 MG tablet Take 100 mg by mouth daily.    . pantoprazole (PROTONIX) 40 MG tablet Take 1 tablet (40 mg total) by mouth 2 (two) times daily before a meal. 180 tablet 1  . traZODone (DESYREL) 100 MG tablet Take 100 mg by mouth at bedtime.     Marland Kitchen venlafaxine (EFFEXOR) 75 MG tablet Take 75 mg by mouth daily.    . vitamin B-12 (CYANOCOBALAMIN) 1000 MCG tablet Take 1,000 mcg by mouth daily. Reported on 06/18/2016     No current facility-administered medications on file prior to visit.        Objective:   Physical Exam  Blood pressure 124/80, pulse 76, temperature 98 F (36.7 C), height 5' 9.75" (1.772 m), weight 186 lb 1.6 oz (84.4 kg). Alert and oriented. Skin warm and dry. Oral mucosa is moist.   . Sclera anicteric, conjunctivae is pink. Thyroid not enlarged. No cervical lymphadenopathy. Lungs  clear. Heart regular rate and rhythm.  Abdomen is soft. Bowel sounds are positive. No hepatomegaly. No abdominal masses felt. No tenderness.  No edema to lower extremities.        Assessment & Plan:  Dysphagia. He is doing much better. No problems swallow. Barrett's esophagus. Continue the Protonix. GERD controlled with Protonix.  OV in 1 year.. If any  problems call our office.

## 2016-11-20 DIAGNOSIS — J209 Acute bronchitis, unspecified: Secondary | ICD-10-CM | POA: Diagnosis not present

## 2016-11-27 ENCOUNTER — Telehealth: Payer: Self-pay | Admitting: Internal Medicine

## 2016-11-27 NOTE — Telephone Encounter (Signed)
Last visit note states to have lipid and hepatic panel done in 6 weeks that will make it about 12-03-16 to have these done.  Pt notified that labwork will be fasting he will go in after 12-03-16

## 2016-11-27 NOTE — Telephone Encounter (Signed)
New message      Pt want to go to [facility] in [place] to have labs drawn.  Please send order.  Pt is not sure when he is to have them drawn.  Please call and let him know when he is to have labs drawn

## 2016-12-04 DIAGNOSIS — I2119 ST elevation (STEMI) myocardial infarction involving other coronary artery of inferior wall: Secondary | ICD-10-CM | POA: Diagnosis not present

## 2016-12-04 DIAGNOSIS — I25110 Atherosclerotic heart disease of native coronary artery with unstable angina pectoris: Secondary | ICD-10-CM | POA: Diagnosis not present

## 2016-12-04 DIAGNOSIS — E782 Mixed hyperlipidemia: Secondary | ICD-10-CM | POA: Diagnosis not present

## 2016-12-04 DIAGNOSIS — I119 Hypertensive heart disease without heart failure: Secondary | ICD-10-CM | POA: Diagnosis not present

## 2016-12-04 DIAGNOSIS — R001 Bradycardia, unspecified: Secondary | ICD-10-CM | POA: Diagnosis not present

## 2016-12-05 LAB — HEPATIC FUNCTION PANEL
ALT: 19 U/L (ref 9–46)
AST: 16 U/L (ref 10–35)
Albumin: 4.3 g/dL (ref 3.6–5.1)
Alkaline Phosphatase: 81 U/L (ref 40–115)
Bilirubin, Direct: 0.1 mg/dL (ref <=0.2)
Indirect Bilirubin: 0.5 mg/dL (ref 0.2–1.2)
Total Bilirubin: 0.6 mg/dL (ref 0.2–1.2)
Total Protein: 6.7 g/dL (ref 6.1–8.1)

## 2016-12-05 LAB — LIPID PANEL
Cholesterol: 105 mg/dL (ref <200)
HDL: 49 mg/dL (ref >40)
LDL Cholesterol: 43 mg/dL (ref <100)
Total CHOL/HDL Ratio: 2.1 Ratio (ref <5.0)
Triglycerides: 63 mg/dL (ref <150)
VLDL: 13 mg/dL (ref <30)

## 2017-01-06 ENCOUNTER — Ambulatory Visit (INDEPENDENT_AMBULATORY_CARE_PROVIDER_SITE_OTHER): Payer: Medicare HMO | Admitting: Internal Medicine

## 2017-01-06 ENCOUNTER — Encounter: Payer: Self-pay | Admitting: Internal Medicine

## 2017-01-06 VITALS — BP 144/92 | HR 59 | Ht 69.75 in | Wt 186.4 lb

## 2017-01-06 DIAGNOSIS — R001 Bradycardia, unspecified: Secondary | ICD-10-CM

## 2017-01-06 DIAGNOSIS — I2119 ST elevation (STEMI) myocardial infarction involving other coronary artery of inferior wall: Secondary | ICD-10-CM | POA: Diagnosis not present

## 2017-01-06 DIAGNOSIS — E782 Mixed hyperlipidemia: Secondary | ICD-10-CM

## 2017-01-06 DIAGNOSIS — I214 Non-ST elevation (NSTEMI) myocardial infarction: Secondary | ICD-10-CM

## 2017-01-06 MED ORDER — VALSARTAN 320 MG PO TABS: 320.0000 mg | ORAL_TABLET | Freq: Every day | ORAL | 5 refills | Status: DC

## 2017-01-06 NOTE — Progress Notes (Signed)
OFFICE NOTE  Chief Complaint:  Hospital follow-up  Primary Care Physician: Purvis Kilts, MD  HPI:  Cory [name] is a 71 year old gentleman, who I have been following for coronary artery disease, status post Taxus stenting in 2002. He also has had some ongoing chronic angina controlled on Imdur 60 mg. He also has PTSD, on a number of medications, but that has been stable, as well as hypertension. Today he has a few different complaints, including some increasing urinary frequency. He does have a history of BPH and is followed at the [place] for this. Sounds like he may have had a TURP procedure recently. He is not on any medications for his prostate. In addition he is describing fatigue. This is a new symptom for him and reports he is sleeping much more regularly and often than he did in the past. He's not exercising as much as he had been before. Of note he is on 3 different antidepressant medications for his PTSD, again managed by the [facility].  He reports fairly stable exertional angina however at times he does get more short of breath with exercise. His last stress test was in 2011.  Cory [name] returns today for follow-up. He is without complaint. His blood pressure appears to be well-controlled. He denies any chest pain and is actually had very little need to take any short acting nitroglycerin since he's been on Imdur. He remains active and has no shortness of breath.  01/08/2017  Cory [name] returns today for follow-up. He was seen in October by Murray Hodgkins, NP, for follow-up of an ST elevation MI. He was vacationing in the [place] [facility] [place] area and presented to [facility] hospital after having ST elevation. He underwent emergent Catheterization and was found to have subtotal occlusion of the right coronary artery. He received 4 drug-eluting stents and was discharged on Effient, aspirin, atenolol and simvastatin. Subsequent to that he is done fairly well without any recurrent  chest pain. He does get a little mild dyspnea with exertion. He did not participate in cardiac rehabilitation. He was noted to be markedly bradycardic and follow-up and his atenolol was discontinued. His simvastatin was discontinued in favor of a highly active statin, namely Lipitor 80 mg daily. A repeat lipid profile was performed on 12/04/2016 showing total cluster 105, triglycerides 63, HDL-C 49, and LDL-C 43. Is is excellent control and I would continue with his current dose. Also of note, his blood pressure is elevated today. He says that it has remained elevated since discontinuing his atenolol.  PMHx:  Past Medical History:  Diagnosis Date  . Anemia   . Arthritis   . Coronary artery disease    a. 09/2001 PCI/BMS to D1: 2.5 x 15 Express 2 BMS;  b. 2011 Cath: stable anatomy; c. 03/2014 Low risk MV, EF 54%;  d. 09/2016 Inf STEMI/PCI [facility]): LM nl, LAD 60m D1 nl w/ 90% in 111minf branch, LCX nl, RCA 99 (Synergy DES x 4 - 3.5x28 prox, 3.5x3873m.5x16d, 2.25x16 to RPDA), EF 55%.  . GMarland KitchenRD (gastroesophageal reflux disease)   . Hyperlipidemia   . Hypertensive heart disease   . Pneumonia   . PTSD (post-traumatic stress disorder)     Past Surgical History:  Procedure Laterality Date  . CARDIAC CATHETERIZATION  05/17/2010   left main free of significant lesion; LAD with 60% lesion in mid segment & 60-70% lesion in mid-distal segment with subtotal occlusion at apex & small side branch  of diagonal 1 that is subtotally occluded; ramus intermedius with 90% ostial stenosis; L Cfx w/mild luminal irreg; RCA is dominant vessel with diffuse disease, 50% prox lesion & 60% lesion in mid segment & 60% lesion in PDA  . CATARACT EXTRACTION W/PHACO Right 06/07/2015   Procedure: CATARACT EXTRACTION PHACO AND INTRAOCULAR LENS PLACEMENT (IOC);  Surgeon: Marylynn Pearson, MD;  Location: [place];  Service: Ophthalmology;  Laterality: Right;  . COLONOSCOPY  02/25/2012   Procedure: COLONOSCOPY;  Surgeon: Jamesetta So, MD;  [facility];  Service: Gastroenterology;  Laterality: N/A;  . CORONARY ANGIOPLASTY WITH STENT PLACEMENT  09/2001   Taxus (2.5x87m) stent to 1st diagonal   . ESOPHAGEAL DILATION N/A 08/14/2016   Procedure: ESOPHAGEAL DILATION;  Surgeon: NRogene Houston MD;  [facility];  Service: Endoscopy;  Laterality: N/A;  . ESOPHAGOGASTRODUODENOSCOPY  02/25/2012   Procedure: ESOPHAGOGASTRODUODENOSCOPY (EGD);  Surgeon: MJamesetta So MD;  [facility];  Service: Gastroenterology;  Laterality: N/A;  . ESOPHAGOGASTRODUODENOSCOPY N/A 07/28/2014   Procedure: ESOPHAGOGASTRODUODENOSCOPY (EGD);  Surgeon: NRogene Houston MD;  [facility];  Service: Endoscopy;  Laterality: N/A;  [place]  . ESOPHAGOGASTRODUODENOSCOPY N/A 08/14/2016   Procedure: ESOPHAGOGASTRODUODENOSCOPY (EGD);  Surgeon: NRogene Houston MD;  [facility];  Service: Endoscopy;  Laterality: N/A;  100  . EYE SURGERY Bilateral    lasik surgery   . KNEE SURGERY    . LATERAL EPICONDYLE RELEASE Left 01/21/2014   Procedure: Left Tennis Elbow Release with debridement  tendon repair with reattachment;  Surgeon: SCarole Civil MD;  [facility];  Service: Orthopedics;  Laterality: Left;  . penile implant    . PROSTATE SURGERY    . SHOULDER SURGERY    . TONSILLECTOMY    . TRANSTHORACIC ECHOCARDIOGRAM  03/2010   EF=>55%; LV mildly dilated; LA mod dilated; mild MR & TR; normal RVSP; mild pulm valve regurg    FAMHx:  Family History  Problem Relation Age of Onset  . Colitis Maternal Aunt     SOCHx:   reports that he has never smoked. He has never used smokeless tobacco. He reports that he does not drink alcohol or use drugs.  ALLERGIES:  Allergies  Allergen Reactions  . Ace Inhibitors   . Benadryl [Diphenhydramine Hcl]     Opposite reaction of medication purpose - hyper  . Benadryl [Diphenhydramine-Zinc Acetate]   . Vioxx [Rofecoxib] Other  (See Comments)    headache    ROS: Pertinent items noted in HPI and remainder of comprehensive ROS otherwise negative.  HOME MEDS: Current Outpatient Prescriptions  Medication Sig Dispense Refill  . aspirin 81 MG tablet Take 81 mg by mouth daily.    .Marland Kitchenatorvastatin (LIPITOR) 80 MG tablet Take 1 tablet (80 mg total) by mouth daily. 30 tablet 1  . BUPROPION HBR ER PO Take 50 mg by mouth daily.    .Marland KitchenEFFIENT 10 MG TABS tablet Take 10 mg by mouth daily.    .Marland Kitchengabapentin (NEURONTIN) 300 MG capsule Take 300 mg by mouth 2 (two) times daily.    . magnesium 30 MG tablet Take 1.25 mg by mouth daily.    . pantoprazole (PROTONIX) 40 MG tablet Take 1 tablet (40 mg total) by mouth 2 (two) times daily before a meal. 180 tablet 1  . traZODone (DESYREL) 100 MG tablet Take 100 mg by mouth at bedtime.     .Marland Kitchenvenlafaxine (EFFEXOR) 75 MG  tablet Take 75 mg by mouth daily.    . vitamin B-12 (CYANOCOBALAMIN) 1000 MCG tablet Take 1,000 mcg by mouth daily. Reported on 06/18/2016    . valsartan (DIOVAN) 320 MG tablet Take 1 tablet (320 mg total) by mouth daily. 30 tablet 5   No current facility-administered medications for this visit.     LABS/IMAGING: No results found for this or any previous visit (from the past 48 hour(s)). No results found.  VITALS: BP (!) 144/92 (BP Location: Left Arm, Patient Position: Sitting, Cuff Size: Normal)   Pulse (!) 59   Ht 5' 9.75" (1.772 m)   Wt 186 lb 6.4 oz (84.6 kg)   BMI 26.94 kg/m   EXAM: General appearance: alert and no distress Neck: no carotid bruit and no JVD Lungs: clear to auscultation bilaterally Heart: regular rate and rhythm, S1, S2 normal, no murmur, click, rub or gallop Abdomen: soft, non-tender; bowel sounds normal; no masses,  no organomegaly Extremities: extremities normal, atraumatic, no cyanosis or edema Pulses: 2+ and symmetric Skin: Skin color, texture, turgor normal. No rashes or lesions Neurologic: Grossly normal Psych: Mood, affect  normal  EKG: Sinus bradycardia at 59, inferior posterior infarct pattern  ASSESSMENT: 1. Recent inferior STEMI, status post 4 DES to the RCA (2017[place] 2. Coronary artery disease status post Taxus DES to the first diagonal and 2002 3. PTSD 4. Hypertension 5. Dyslipidemia  PLAN: 1.   Cory [name] unfortunately had a recent inferior STEMI. He is status post PCI to the right coronary artery. He is now on a highly active statin with marked improvement in cholesterol. He did not have any LV assessment that I could see and therefore recommend an echocardiogram to evaluate LV function. He has had some mild dyspnea. Blood pressure appears to be well-controlled and heart rate has improved after discontinuing atenolol, although he remains bradycardic. Blood pressures remained elevated since discontinuing atenolol, I would recommend discontinuing his losartan and switching him to valsartan 320 mg daily. Plan follow-up after his echocardiogram.  [name], MD, [facility] Attending Cardiologist [place] 01/06/2017, 4:15 PM

## 2017-01-06 NOTE — Patient Instructions (Addendum)
Your physician has requested that you have an echocardiogram @ [facility]. Echocardiography is a painless test that uses sound waves to create images of your heart. It provides your doctor with information about the size and shape of your heart and how well your heart's chambers and valves are working. This procedure takes approximately one hour. There are no restrictions for this procedure.  Your physician has recommended you make the following change in your medication:  1. STOP losartan 2. START valsartan 320mg  once daily  Dr. Debara Pickett would like you to monitor your BP at home & record your readings He recommends an OMRON arm blood pressure cuff It is best to get a reading in the morning and one in the evening. You should make sure to sit and relax for 1-5 minutes before the reading is taken.   Your physician recommends that you schedule a follow-up appointment after your echo

## 2017-01-07 ENCOUNTER — Ambulatory Visit (HOSPITAL_COMMUNITY)
Admission: RE | Admit: 2017-01-07 | Discharge: 2017-01-07 | Disposition: A | Discharge status: Home / Self Care | Payer: Medicare HMO | Source: Ambulatory Visit | Attending: Internal Medicine | Admitting: Internal Medicine

## 2017-01-07 DIAGNOSIS — I214 Non-ST elevation (NSTEMI) myocardial infarction: Secondary | ICD-10-CM | POA: Diagnosis not present

## 2017-01-07 DIAGNOSIS — I517 Cardiomegaly: Secondary | ICD-10-CM | POA: Diagnosis not present

## 2017-01-07 NOTE — Progress Notes (Signed)
*  PRELIMINARY RESULTS* Echocardiogram 2D Echocardiogram has been performed.  Cory [name] 01/07/2017, 2:00 PM

## 2017-01-08 DIAGNOSIS — R001 Bradycardia, unspecified: Secondary | ICD-10-CM | POA: Insufficient documentation

## 2017-01-08 DIAGNOSIS — E782 Mixed hyperlipidemia: Secondary | ICD-10-CM | POA: Insufficient documentation

## 2017-01-08 DIAGNOSIS — I214 Non-ST elevation (NSTEMI) myocardial infarction: Secondary | ICD-10-CM | POA: Insufficient documentation

## 2017-01-08 DIAGNOSIS — I2119 ST elevation (STEMI) myocardial infarction involving other coronary artery of inferior wall: Secondary | ICD-10-CM | POA: Insufficient documentation

## 2017-01-09 ENCOUNTER — Other Ambulatory Visit (HOSPITAL_COMMUNITY): Payer: Medicare HMO

## 2017-01-24 ENCOUNTER — Ambulatory Visit (INDEPENDENT_AMBULATORY_CARE_PROVIDER_SITE_OTHER): Payer: Medicare HMO | Admitting: Internal Medicine

## 2017-01-24 ENCOUNTER — Encounter: Payer: Self-pay | Admitting: Internal Medicine

## 2017-01-24 VITALS — BP 130/82 | HR 62 | Ht 69.75 in | Wt 182.8 lb

## 2017-01-24 DIAGNOSIS — E782 Mixed hyperlipidemia: Secondary | ICD-10-CM

## 2017-01-24 DIAGNOSIS — M25562 Pain in left knee: Secondary | ICD-10-CM

## 2017-01-24 DIAGNOSIS — I2119 ST elevation (STEMI) myocardial infarction involving other coronary artery of inferior wall: Secondary | ICD-10-CM | POA: Diagnosis not present

## 2017-01-24 DIAGNOSIS — I25110 Atherosclerotic heart disease of native coronary artery with unstable angina pectoris: Secondary | ICD-10-CM

## 2017-01-24 DIAGNOSIS — I10 Essential (primary) hypertension: Secondary | ICD-10-CM

## 2017-01-24 NOTE — Progress Notes (Signed)
OFFICE NOTE  Chief Complaint:  Follow=up echo  Primary Care Physician: Purvis Kilts, MD  HPI:  Cory [name] is a 71 year old gentleman, who I have been following for coronary artery disease, status post Taxus stenting in 2002. He also has had some ongoing chronic angina controlled on Imdur 60 mg. He also has PTSD, on a number of medications, but that has been stable, as well as hypertension. Today he has a few different complaints, including some increasing urinary frequency. He does have a history of BPH and is followed at the [place] for this. Sounds like he may have had a TURP procedure recently. He is not on any medications for his prostate. In addition he is describing fatigue. This is a new symptom for him and reports he is sleeping much more regularly and often than he did in the past. He's not exercising as much as he had been before. Of note he is on 3 different antidepressant medications for his PTSD, again managed by the [facility].  He reports fairly stable exertional angina however at times he does get more short of breath with exercise. His last stress test was in 2011.  Mr. Cory [name] returns today for follow-up. He is without complaint. His blood pressure appears to be well-controlled. He denies any chest pain and is actually had very little need to take any short acting nitroglycerin since he's been on Imdur. He remains active and has no shortness of breath.  01/08/2017  Mr. Cory [name] returns today for follow-up. He was seen in October by Murray Hodgkins, NP, for follow-up of an ST elevation MI. He was vacationing in the [place] [facility] area and presented to [facility] hospital after having ST elevation. He underwent emergent Catheterization and was found to have subtotal occlusion of the right coronary artery. He received 4 drug-eluting stents and was discharged on Effient, aspirin, atenolol and simvastatin. Subsequent to that he is done fairly well without any recurrent  chest pain. He does get a little mild dyspnea with exertion. He did not participate in cardiac rehabilitation. He was noted to be markedly bradycardic and follow-up and his atenolol was discontinued. His simvastatin was discontinued in favor of a highly active statin, namely Lipitor 80 mg daily. A repeat lipid profile was performed on 12/04/2016 showing total cluster 105, triglycerides 63, HDL-C 49, and LDL-C 43. Is is excellent control and I would continue with his current dose. Also of note, his blood pressure is elevated today. He says that it has remained elevated since discontinuing his atenolol.  01/24/2017  Mr. Cory [name] returns for follow-up of his echo. Recently that demonstrated an EF of 45% which is reduced from his prior studies. This is related to his recent STEMI. Hopefully he'll have some recovery of his LV function in time. I did switch him from losartan 100 up to valsartan 320 since his blood pressure was not at goal. Now his blood pressure is much improved at 130/82. He feels really well other than he has some pain behind his left knee. He reports to stiffness and difficulty in extending it. He does not recall injuring it. He wondered if this is related to his cholesterol medicine. Of note he is been on a statin medication now for several months.  PMHx:  Past Medical History:  Diagnosis Date  . Anemia   . Arthritis   . Coronary artery disease    a. 09/2001 PCI/BMS to D1: 2.5 x 15 Express 2 BMS;  b. 2011  Cath: stable anatomy; c. 03/2014 Low risk MV, EF 54%;  d. 09/2016 Inf STEMI/PCI [facility]): LM nl, LAD 40m D1 nl w/ 90% in 152minf branch, LCX nl, RCA 99 (Synergy DES x 4 - 3.5x28 prox, 3.5x3879m.5x16d, 2.25x16 to RPDA), EF 55%.  . GMarland KitchenRD (gastroesophageal reflux disease)   . Hyperlipidemia   . Hypertensive heart disease   . Pneumonia   . PTSD (post-traumatic stress disorder)     Past Surgical History:  Procedure Laterality Date  . CARDIAC CATHETERIZATION  05/17/2010     left main free of significant lesion; LAD with 60% lesion in mid segment & 60-70% lesion in mid-distal segment with subtotal occlusion at apex & small side branch of diagonal 1 that is subtotally occluded; ramus intermedius with 90% ostial stenosis; L Cfx w/mild luminal irreg; RCA is dominant vessel with diffuse disease, 50% prox lesion & 60% lesion in mid segment & 60% lesion in PDA  . CATARACT EXTRACTION W/PHACO Right 06/07/2015   Procedure: CATARACT EXTRACTION PHACO AND INTRAOCULAR LENS PLACEMENT (IOC);  Surgeon: RoyMarylynn PearsonD;  [facility]  Laterality: Right;  . COLONOSCOPY  02/25/2012   Procedure: COLONOSCOPY;  Surgeon: MarJamesetta SoD;  [facility];  Service: Gastroenterology;  Laterality: N/A;  . CORONARY ANGIOPLASTY WITH STENT PLACEMENT  09/2001   Taxus (2.5x15m49mtent to 1st diagonal   . ESOPHAGEAL DILATION N/A 08/14/2016   Procedure: ESOPHAGEAL DILATION;  Surgeon: NajeRogene Houston;  [facility];  Service: Endoscopy;  Laterality: N/A;  . ESOPHAGOGASTRODUODENOSCOPY  02/25/2012   Procedure: ESOPHAGOGASTRODUODENOSCOPY (EGD);  Surgeon: MarkJamesetta So;  [facility];  Service: Gastroenterology;  Laterality: N/A;  . ESOPHAGOGASTRODUODENOSCOPY N/A 07/28/2014   Procedure: ESOPHAGOGASTRODUODENOSCOPY (EGD);  Surgeon: NajeRogene Houston;  [facility];  Service: Endoscopy;  Laterality: N/A;  200-rescheduled to 245 [place]ified pt  . ESOPHAGOGASTRODUODENOSCOPY N/A 08/14/2016   Procedure: ESOPHAGOGASTRODUODENOSCOPY (EGD);  Surgeon: NajeRogene Houston;  [facility];  Service: Endoscopy;  Laterality: N/A;  100  . EYE SURGERY Bilateral    lasik surgery   . KNEE SURGERY    . LATERAL EPICONDYLE RELEASE Left 01/21/2014   Procedure: Left Tennis Elbow Release with debridement  tendon repair with reattachment;  Surgeon: StanCarole Civil;  Location: [facility];  Service: Orthopedics;  Laterality: Left;  .  penile implant    . PROSTATE SURGERY    . SHOULDER SURGERY    . TONSILLECTOMY    . TRANSTHORACIC ECHOCARDIOGRAM  03/2010   EF=>55%; LV mildly dilated; LA mod dilated; mild MR & TR; normal RVSP; mild pulm valve regurg    FAMHx:  Family History  Problem Relation Age of Onset  . Colitis Maternal Aunt     SOCHx:   reports that he has never smoked. He has never used smokeless tobacco. He reports that he does not drink alcohol or use drugs.  ALLERGIES:  Allergies  Allergen Reactions  . Ace Inhibitors   . Benadryl [Diphenhydramine Hcl]     Opposite reaction of medication purpose - hyper  . Benadryl [Diphenhydramine-Zinc Acetate]   . Vioxx [Rofecoxib] Other (See Comments)    headache    ROS: Pertinent items noted in HPI and remainder of comprehensive ROS otherwise negative.  HOME MEDS: Current Outpatient Prescriptions  Medication Sig Dispense Refill  . aspirin 81 MG tablet Take 81 mg by mouth daily.    . atMarland Kitchenrvastatin (LIPITOR)  80 MG tablet Take 80 mg by mouth daily.    . BUPROPION HBR ER PO Take 50 mg by mouth daily.    Marland Kitchen EFFIENT 10 MG TABS tablet Take 10 mg by mouth daily.    Marland Kitchen gabapentin (NEURONTIN) 300 MG capsule Take 300 mg by mouth 2 (two) times daily.    . magnesium 30 MG tablet Take 1.25 mg by mouth daily.    . pantoprazole (PROTONIX) 40 MG tablet Take 1 tablet (40 mg total) by mouth 2 (two) times daily before a meal. 180 tablet 1  . traZODone (DESYREL) 100 MG tablet Take 100 mg by mouth at bedtime.     . valsartan (DIOVAN) 320 MG tablet Take 1 tablet (320 mg total) by mouth daily. 30 tablet 5  . venlafaxine (EFFEXOR) 75 MG tablet Take 75 mg by mouth daily.     No current facility-administered medications for this visit.     LABS/IMAGING: No results found for this or any previous visit (from the past 48 hour(s)). No results found.  VITALS: BP 130/82   Pulse 62   Ht 5' 9.75" (1.772 m)   Wt 182 lb 12.8 oz (82.9 kg)   BMI 26.42 kg/m   EXAM: General appearance:  alert and no distress Heart: regular rate and rhythm, S1, S2 normal, no murmur, click, rub or gallop Extremities: extremities normal, atraumatic, no cyanosis or edema and Left knee is without crepitus or effusion, no obvious palpable Baker's cyst Neurologic: Grossly normal  EKG: Deferred  ASSESSMENT: 1. Recent inferior STEMI, status post 4 DES to the RCA (2017[place] 2. LVEF 45% (12/2016) 3. Coronary artery disease status post Taxus DES to the first diagonal and 2002 4. PTSD 5. Hypertension 6. Dyslipidemia 7. Left posterior knee pain  PLAN: 1.   Mr. Cory [name] could have a Baker's cyst behind the left knee he reports stiffness difficulty in extending the knee. This could also be arthritis. It seems unlikely to be related to his cholesterol medicine. I like for him to be evaluated by an orthopedist first and if there is no clear cause of it we could consider a two-week statin holiday. Otherwise his cholesterol is now much better controlled on high-dose statin. LDL less than 50. He is EF is 45% based on recent echo. Hopefully that'll be some recovery over time. Plan to see him back in 6 months or sooner as necessary.  [name], MD, [facility] Attending Cardiologist [place] Anetha Slagel 01/24/2017, 4:00 PM

## 2017-01-24 NOTE — Patient Instructions (Signed)
Your physician wants you to follow-up in SIX MONTHS with Dr. Hilty. You will receive a reminder letter in the mail two months in advance. If you don't receive a letter, please call our office to schedule the follow-up appointment.  

## 2017-01-28 ENCOUNTER — Telehealth: Payer: Self-pay | Admitting: Internal Medicine

## 2017-01-28 MED ORDER — ATORVASTATIN CALCIUM 80 MG PO TABS: 80.0000 mg | ORAL_TABLET | Freq: Every day | ORAL | 3 refills | Status: DC

## 2017-01-28 NOTE — Telephone Encounter (Signed)
Refill sent to the pharmacy electronically.  

## 2017-01-28 NOTE — Telephone Encounter (Signed)
New message      *STAT* If patient is at the pharmacy, call can be transferred to refill team.   1. Which medications need to be refilled? (please list name of each medication and dose if known) atorvastatin (LIPITOR) 80 MG tablet  2. Which pharmacy/location (including street and city if local pharmacy) is medication to be sent to? [facility] in [place]    3. Do they need a 30 day or 90 day supply? 90 days

## 2017-01-29 ENCOUNTER — Telehealth: Payer: Self-pay | Admitting: Internal Medicine

## 2017-01-29 MED ORDER — VALSARTAN 320 MG PO TABS: 320.0000 mg | ORAL_TABLET | Freq: Every day | ORAL | 1 refills | Status: DC

## 2017-01-29 NOTE — Telephone Encounter (Signed)
°*  STAT* If patient is at the pharmacy, call can be transferred to refill team.   1. Which medications need to be refilled? (please list name of each medication and dose if known) valsartan, 320 mg  2. Which pharmacy/location (including street and city if local pharmacy) is medication to be sent to? [facility] [address]  3. Do they need a 30 day or 90 day supply? 90    *patient has about 6 pills left

## 2017-02-04 ENCOUNTER — Ambulatory Visit: Payer: Medicare HMO | Admitting: Orthopedic Surgery

## 2017-02-07 DIAGNOSIS — R197 Diarrhea, unspecified: Secondary | ICD-10-CM | POA: Diagnosis not present

## 2017-02-07 DIAGNOSIS — Z6825 Body mass index (BMI) 25.0-25.9, adult: Secondary | ICD-10-CM | POA: Diagnosis not present

## 2017-02-07 DIAGNOSIS — J069 Acute upper respiratory infection, unspecified: Secondary | ICD-10-CM | POA: Diagnosis not present

## 2017-02-07 DIAGNOSIS — I10 Essential (primary) hypertension: Secondary | ICD-10-CM | POA: Diagnosis not present

## 2017-02-10 DIAGNOSIS — J069 Acute upper respiratory infection, unspecified: Secondary | ICD-10-CM | POA: Diagnosis not present

## 2017-02-10 DIAGNOSIS — J343 Hypertrophy of nasal turbinates: Secondary | ICD-10-CM | POA: Diagnosis not present

## 2017-02-10 DIAGNOSIS — Z1389 Encounter for screening for other disorder: Secondary | ICD-10-CM | POA: Diagnosis not present

## 2017-02-10 DIAGNOSIS — R6889 Other general symptoms and signs: Secondary | ICD-10-CM | POA: Diagnosis not present

## 2017-02-26 DIAGNOSIS — M79671 Pain in right foot: Secondary | ICD-10-CM | POA: Diagnosis not present

## 2017-02-26 DIAGNOSIS — M722 Plantar fascial fibromatosis: Secondary | ICD-10-CM | POA: Diagnosis not present

## 2017-03-10 ENCOUNTER — Ambulatory Visit: Payer: Medicare HMO | Admitting: Orthopedic Surgery

## 2017-03-12 ENCOUNTER — Encounter: Payer: Self-pay | Admitting: Orthopedic Surgery

## 2017-03-12 ENCOUNTER — Ambulatory Visit (INDEPENDENT_AMBULATORY_CARE_PROVIDER_SITE_OTHER): Payer: Medicare HMO

## 2017-03-12 ENCOUNTER — Ambulatory Visit (INDEPENDENT_AMBULATORY_CARE_PROVIDER_SITE_OTHER): Payer: Medicare HMO | Admitting: Orthopedic Surgery

## 2017-03-12 VITALS — BP 138/83 | HR 61 | Ht 69.75 in | Wt 179.0 lb

## 2017-03-12 DIAGNOSIS — Z7982 Long term (current) use of aspirin: Secondary | ICD-10-CM | POA: Diagnosis not present

## 2017-03-12 DIAGNOSIS — M1712 Unilateral primary osteoarthritis, left knee: Secondary | ICD-10-CM

## 2017-03-12 DIAGNOSIS — R69 Illness, unspecified: Secondary | ICD-10-CM | POA: Diagnosis not present

## 2017-03-12 DIAGNOSIS — G4700 Insomnia, unspecified: Secondary | ICD-10-CM | POA: Diagnosis not present

## 2017-03-12 DIAGNOSIS — K219 Gastro-esophageal reflux disease without esophagitis: Secondary | ICD-10-CM | POA: Diagnosis not present

## 2017-03-12 DIAGNOSIS — I10 Essential (primary) hypertension: Secondary | ICD-10-CM | POA: Diagnosis not present

## 2017-03-12 DIAGNOSIS — I259 Chronic ischemic heart disease, unspecified: Secondary | ICD-10-CM | POA: Diagnosis not present

## 2017-03-12 DIAGNOSIS — M25562 Pain in left knee: Secondary | ICD-10-CM

## 2017-03-12 DIAGNOSIS — E785 Hyperlipidemia, unspecified: Secondary | ICD-10-CM | POA: Diagnosis not present

## 2017-03-12 DIAGNOSIS — G2581 Restless legs syndrome: Secondary | ICD-10-CM | POA: Diagnosis not present

## 2017-03-12 DIAGNOSIS — Z0000 Encounter for general adult medical examination without abnormal findings: Secondary | ICD-10-CM | POA: Diagnosis not present

## 2017-03-12 DIAGNOSIS — Z79899 Other long term (current) drug therapy: Secondary | ICD-10-CM | POA: Diagnosis not present

## 2017-03-12 DIAGNOSIS — H9113 Presbycusis, bilateral: Secondary | ICD-10-CM | POA: Diagnosis not present

## 2017-03-12 DIAGNOSIS — Z974 Presence of external hearing-aid: Secondary | ICD-10-CM | POA: Diagnosis not present

## 2017-03-12 NOTE — Progress Notes (Signed)
Patient ID: Cory [name], male   DOB: 1946-12-27, 71 y.o.   MRN: [id number]  Chief Complaint  Patient presents with  . Knee Pain    LEFT KNEE PAIN, NO ACUTE INJURY    HPI  71 year old male presents for evaluation of his left knee. No acute injury. Patient was working on the project at his house he was doing a lot of bending and squatting set down and when he got up he had intense pain in the back of his knee was nonradiating with aching but it did prevent him from walking  Review of Systems  Constitutional: Negative for chills, fever and weight loss.  Respiratory: Negative for shortness of breath.   Cardiovascular: Negative for chest pain.  Neurological: Negative for tingling.    Past Medical History:  Diagnosis Date  . Anemia   . Arthritis   . Coronary artery disease    a. 09/2001 PCI/BMS to D1: 2.5 x 15 Express 2 BMS;  b. 2011 Cath: stable anatomy; c. 03/2014 Low risk MV, EF 54%;  d. 09/2016 Inf STEMI/PCI [facility]): LM nl, LAD 71m D1 nl w/ 90% in 154minf branch, LCX nl, RCA 99 (Synergy DES x 4 - 3.5x28 prox, 3.5x3833m.5x16d, 2.25x16 to RPDA), EF 55%.  . GMarland KitchenRD (gastroesophageal reflux disease)   . Hyperlipidemia   . Hypertensive heart disease   . Pneumonia   . PTSD (post-traumatic stress disorder)     Past Surgical History:  Procedure Laterality Date  . CARDIAC CATHETERIZATION  05/17/2010   left main free of significant lesion; LAD with 60% lesion in mid segment & 60-70% lesion in mid-distal segment with subtotal occlusion at apex & small side branch of diagonal 1 that is subtotally occluded; ramus intermedius with 90% ostial stenosis; L Cfx w/mild luminal irreg; RCA is dominant vessel with diffuse disease, 50% prox lesion & 60% lesion in mid segment & 60% lesion in PDA  . CATARACT EXTRACTION W/PHACO Right 06/07/2015   Procedure: CATARACT EXTRACTION PHACO AND INTRAOCULAR LENS PLACEMENT (IOC);  Surgeon: RoyMarylynn PearsonD;  [facility] [place];   Laterality: Right;  . COLONOSCOPY  02/25/2012   Procedure: COLONOSCOPY;  Surgeon: MarJamesetta SoD;  [facility];  Service: Gastroenterology;  Laterality: N/A;  . CORONARY ANGIOPLASTY WITH STENT PLACEMENT  09/2001   Taxus (2.5x15m40mtent to 1st diagonal   . ESOPHAGEAL DILATION N/A 08/14/2016   Procedure: ESOPHAGEAL DILATION;  Surgeon: NajeRogene Houston;  [facility];  Service: Endoscopy;  Laterality: N/A;  . ESOPHAGOGASTRODUODENOSCOPY  02/25/2012   Procedure: ESOPHAGOGASTRODUODENOSCOPY (EGD);  Surgeon: MarkJamesetta So;  [facility];  Service: Gastroenterology;  Laterality: N/A;  . ESOPHAGOGASTRODUODENOSCOPY N/A 07/28/2014   Procedure: ESOPHAGOGASTRODUODENOSCOPY (EGD);  Surgeon: NajeRogene Houston;  [facility];  Service: Endoscopy;  Laterality: N/A;  200-rescheduled to 245 [place] pt  . ESOPHAGOGASTRODUODENOSCOPY N/A 08/14/2016   Procedure: ESOPHAGOGASTRODUODENOSCOPY (EGD);  Surgeon: NajeRogene Houston;  [facility];  Service: Endoscopy;  Laterality: N/A;  100  . EYE SURGERY Bilateral    lasik surgery   . KNEE SURGERY    . LATERAL EPICONDYLE RELEASE Left 01/21/2014   Procedure: Left Tennis Elbow Release with debridement  tendon repair with reattachment;  Surgeon: StanCarole Civil;  [facility];  Service: Orthopedics;  Laterality: Left;  . penile implant    . PROSTATE SURGERY    . SHOULDER SURGERY    .  TONSILLECTOMY    . TRANSTHORACIC ECHOCARDIOGRAM  03/2010   EF=>55%; LV mildly dilated; LA mod dilated; mild MR & TR; normal RVSP; mild pulm valve regurg    PHYSICAL EXAM  BP 138/83   Pulse 61   Ht 5' 9.75" (1.772 m)   Wt 179 lb (81.2 kg)   BMI 25.87 kg/m  GENERAL appearance reveals no gross abnormalities, normal development grooming and hygiene   MENTAL STATUS we note that the patient is awake alert and oriented to person place and time MOOD/AFFECT ARE NORMAL   GAIT reveals No limp in the effected  limb  EXAM OF THE LEFT KNEE SKIN no erythema lacerations or ecchymosis  INSPECTION NO tenderness over the MEDIAL OR LATERAL  joint line and NO  joint effusion  ROM is NORMAL  STABILITY tests for the acl (anterior drawer) and pcl posterior drawer are normal. the collateral ligaments are stable with varus valgus stress testing  MOTOR GRADE 5/5 in the quad muscles    examination of the  RIGHT  knee  NORMAL ROM AND ALIGNMENT NO TENDERNESS  VASC 2+ dorsalis pedis pulse normal capillary refill excellent warmth to the extremity  NEURO normal sensation and no pathologic reflexes  LYMPH deferred noncontributory   IMAGING STUDIES  I have independently reviewed the x-rays and I interpreted the x-rays as follows:  MILD OA MEDIAL COMPARTMENT   Dx   primary osteoarthritis of the  LEFT knee  PLAN  OBSERVATION   10:36 AM [name], MD 03/12/2017

## 2017-03-19 DIAGNOSIS — M79671 Pain in right foot: Secondary | ICD-10-CM | POA: Diagnosis not present

## 2017-03-19 DIAGNOSIS — M722 Plantar fascial fibromatosis: Secondary | ICD-10-CM | POA: Diagnosis not present

## 2017-04-30 DIAGNOSIS — M722 Plantar fascial fibromatosis: Secondary | ICD-10-CM | POA: Diagnosis not present

## 2017-04-30 DIAGNOSIS — M79671 Pain in right foot: Secondary | ICD-10-CM | POA: Diagnosis not present

## 2017-04-30 DIAGNOSIS — M79672 Pain in left foot: Secondary | ICD-10-CM | POA: Diagnosis not present

## 2017-05-21 DIAGNOSIS — M79671 Pain in right foot: Secondary | ICD-10-CM | POA: Diagnosis not present

## 2017-05-21 DIAGNOSIS — M722 Plantar fascial fibromatosis: Secondary | ICD-10-CM | POA: Diagnosis not present

## 2017-05-29 DIAGNOSIS — Z1389 Encounter for screening for other disorder: Secondary | ICD-10-CM | POA: Diagnosis not present

## 2017-05-29 DIAGNOSIS — J069 Acute upper respiratory infection, unspecified: Secondary | ICD-10-CM | POA: Diagnosis not present

## 2017-05-29 DIAGNOSIS — Z6825 Body mass index (BMI) 25.0-25.9, adult: Secondary | ICD-10-CM | POA: Diagnosis not present

## 2017-05-29 DIAGNOSIS — J029 Acute pharyngitis, unspecified: Secondary | ICD-10-CM | POA: Diagnosis not present

## 2017-06-12 DIAGNOSIS — M722 Plantar fascial fibromatosis: Secondary | ICD-10-CM | POA: Diagnosis not present

## 2017-06-12 DIAGNOSIS — M79671 Pain in right foot: Secondary | ICD-10-CM | POA: Diagnosis not present

## 2017-07-18 ENCOUNTER — Telehealth: Payer: Self-pay | Admitting: Internal Medicine

## 2017-07-18 MED ORDER — IRBESARTAN 300 MG PO TABS: 300.0000 mg | ORAL_TABLET | Freq: Every day | ORAL | 3 refills | Status: DC

## 2017-07-18 NOTE — Telephone Encounter (Signed)
New message    Pt c/o medication issue:  1. Name of Medication: Valsartan   2. How are you currently taking this medication (dosage and times per day)? 320 mg  3. Are you having a reaction (difficulty breathing--STAT)?   4. What is your medication issue? Medication being recalled.

## 2017-07-18 NOTE — Telephone Encounter (Signed)
Pt is currently taking valsartan320mg ,as directed by [facility] current memo will change to Irbesartan 300mg . Patient has home BP cuff, pt will take BP at home every 2-3 days for 2 weeks to ensure no significant changes in BP. She will call with any BP changes s/e for further directions Refill sent as requested

## 2017-07-18 NOTE — Addendum Note (Signed)
Addended by: Waylan Rocher on: 07/18/2017 01:41 PM   Modules accepted: Orders

## 2017-07-28 ENCOUNTER — Encounter: Payer: Self-pay | Admitting: *Deleted

## 2017-08-07 ENCOUNTER — Encounter: Payer: Self-pay | Admitting: Internal Medicine

## 2017-08-07 ENCOUNTER — Ambulatory Visit (INDEPENDENT_AMBULATORY_CARE_PROVIDER_SITE_OTHER): Payer: Medicare HMO | Admitting: Internal Medicine

## 2017-08-07 VITALS — BP 132/78 | HR 63 | Ht 69.75 in | Wt 173.0 lb

## 2017-08-07 DIAGNOSIS — I25110 Atherosclerotic heart disease of native coronary artery with unstable angina pectoris: Secondary | ICD-10-CM

## 2017-08-07 DIAGNOSIS — I10 Essential (primary) hypertension: Secondary | ICD-10-CM

## 2017-08-07 DIAGNOSIS — E782 Mixed hyperlipidemia: Secondary | ICD-10-CM

## 2017-08-07 DIAGNOSIS — I2119 ST elevation (STEMI) myocardial infarction involving other coronary artery of inferior wall: Secondary | ICD-10-CM | POA: Diagnosis not present

## 2017-08-07 MED ORDER — IRBESARTAN 300 MG PO TABS: 300.0000 mg | ORAL_TABLET | Freq: Every day | ORAL | 3 refills | Status: DC

## 2017-08-07 NOTE — Progress Notes (Signed)
**Note Cory-Identified via Obfuscation** OFFICE NOTE  Chief Complaint:  No complaints  Primary Care Physician: Sharilyn Sites, MD  HPI:  DEMORIO [name] is a 71 year old gentleman, who I have been following for coronary artery disease, status post Taxus stenting in 2002. He also has had some ongoing chronic angina controlled on Imdur 60 mg. He also has PTSD, on a number of medications, but that has been stable, as well as hypertension. Today he has a few different complaints, including some increasing urinary frequency. He does have a history of BPH and is followed at the [place] for this. Sounds like he may have had a TURP procedure recently. He is not on any medications for his prostate. In addition he is describing fatigue. This is a new symptom for him and reports he is sleeping much more regularly and often than he did in the past. He's not exercising as much as he had been before. Of note he is on 3 different antidepressant medications for his PTSD, again managed by the [facility].  He reports fairly stable exertional angina however at times he does get more short of breath with exercise. His last stress test was in 2011.  Cory [name] returns today for follow-up. He is without complaint. His blood pressure appears to be well-controlled. He denies any chest pain and is actually had very little need to take any short acting nitroglycerin since he's been on Imdur. He remains active and has no shortness of breath.  01/08/2017  Cory [name] returns today for follow-up. He was seen in October by Murray Hodgkins, NP, for follow-up of an ST elevation MI. He was vacationing in the [facility] area and presented to [facility] hospital after having ST elevation. He underwent emergent Catheterization and was found to have subtotal occlusion of the right coronary artery. He received 4 drug-eluting stents and was discharged on Effient, aspirin, atenolol and simvastatin. Subsequent to that he is done fairly well without any recurrent chest pain.  He does get a little mild dyspnea with exertion. He did not participate in cardiac rehabilitation. He was noted to be markedly bradycardic and follow-up and his atenolol was discontinued. His simvastatin was discontinued in favor of a highly active statin, namely Lipitor 80 mg daily. A repeat lipid profile was performed on 12/04/2016 showing total cluster 105, triglycerides 63, HDL-C 49, and LDL-C 43. Is is excellent control and I would continue with his current dose. Also of note, his blood pressure is elevated today. He says that it has remained elevated since discontinuing his atenolol.  01/24/2017  Cory [name] returns for follow-up of his echo. Recently that demonstrated an EF of 45% which is reduced from his prior studies. This is related to his recent STEMI. Hopefully he'll have some recovery of his LV function in time. I did switch him from losartan 100 up to valsartan 320 since his blood pressure was not at goal. Now his blood pressure is much improved at 130/82. He feels really well other than he has some pain behind his left knee. He reports to stiffness and difficulty in extending it. He does not recall injuring it. He wondered if this is related to his cholesterol medicine. Of note he is been on a statin medication now for several months.  08/07/2017  Cory [name] was seen today in follow-up. Over the last few months she's had no new complaints. He denies any chest pain or worsening shortness of breath. He was seen about his knee pain and told he  has arthritis. His EF was 45% on echo and recently was subjected to a valsartan and recall. He was switched over to irbesartan and seems to be tolerating that well. Blood pressure is reasonably well controlled. He should be on dual antiplatelet therapy and at least until October 2018.  PMHx:  Past Medical History:  Diagnosis Date  . Anemia   . Arthritis   . Coronary artery disease    a. 09/2001 PCI/BMS to D1: 2.5 x 15 Express 2 BMS;  b. 2011 Cath:  stable anatomy; c. 03/2014 Low risk MV, EF 54%;  d. 09/2016 Inf STEMI/PCI [facility]): LM nl, LAD 71m D1 nl w/ 90% in 121minf branch, LCX nl, RCA 99 (Synergy DES x 4 - 3.5x28 prox, 3.5x3831m.5x16d, 2.25x16 to RPDA), EF 55%.  . GMarland KitchenRD (gastroesophageal reflux disease)   . Hyperlipidemia   . Hypertensive heart disease   . Pneumonia   . PTSD (post-traumatic stress disorder)     Past Surgical History:  Procedure Laterality Date  . CARDIAC CATHETERIZATION  05/17/2010   left main free of significant lesion; LAD with 60% lesion in mid segment & 60-70% lesion in mid-distal segment with subtotal occlusion at apex & small side branch of diagonal 1 that is subtotally occluded; ramus intermedius with 90% ostial stenosis; L Cfx w/mild luminal irreg; RCA is dominant vessel with diffuse disease, 50% prox lesion & 60% lesion in mid segment & 60% lesion in PDA  . CATARACT EXTRACTION W/PHACO Right 06/07/2015   Procedure: CATARACT EXTRACTION PHACO AND INTRAOCULAR LENS PLACEMENT (IOC);  Surgeon: RoyMarylynn PearsonD;  [facility]  Laterality: Right;  . COLONOSCOPY  02/25/2012   Procedure: COLONOSCOPY;  Surgeon: MarJamesetta SoD;  [facility];  Service: Gastroenterology;  Laterality: N/A;  . CORONARY ANGIOPLASTY WITH STENT PLACEMENT  09/2001   Taxus (2.5x15m74mtent to 1st diagonal   . ESOPHAGEAL DILATION N/A 08/14/2016   Procedure: ESOPHAGEAL DILATION;  Surgeon: NajeRogene Houston;  [facility];  Service: Endoscopy;  Laterality: N/A;  . ESOPHAGOGASTRODUODENOSCOPY  02/25/2012   Procedure: ESOPHAGOGASTRODUODENOSCOPY (EGD);  Surgeon: MarkJamesetta So;  [facility];  Service: Gastroenterology;  Laterality: N/A;  . ESOPHAGOGASTRODUODENOSCOPY N/A 07/28/2014   Procedure: ESOPHAGOGASTRODUODENOSCOPY (EGD);  Surgeon: NajeRogene Houston;  [facility];  Service: Endoscopy;  Laterality: N/A;  200[place]  .  ESOPHAGOGASTRODUODENOSCOPY N/A 08/14/2016   Procedure: ESOPHAGOGASTRODUODENOSCOPY (EGD);  Surgeon: NajeRogene Houston;  [facility];  Service: Endoscopy;  Laterality: N/A;  100  . EYE SURGERY Bilateral    lasik surgery   . KNEE SURGERY    . LATERAL EPICONDYLE RELEASE Left 01/21/2014   Procedure: Left Tennis Elbow Release with debridement  tendon repair with reattachment;  Surgeon: StanCarole Civil;  Location: [facility];  Service: Orthopedics;  Laterality: Left;  . penile implant    . PROSTATE SURGERY    . SHOULDER SURGERY    . TONSILLECTOMY    . TRANSTHORACIC ECHOCARDIOGRAM  03/2010   EF=>55%; LV mildly dilated; LA mod dilated; mild MR & TR; normal RVSP; mild pulm valve regurg    FAMHx:  Family History  Problem Relation Age of Onset  . Colitis Maternal Aunt     SOCHx:   reports that he has never smoked. He has never used smokeless tobacco. He reports that he does not drink alcohol or use drugs.  ALLERGIES:  Allergies  Allergen Reactions  . Ace Inhibitors   . Benadryl [Diphenhydramine Hcl]     Opposite reaction of medication purpose - hyper  . Benadryl [Diphenhydramine-Zinc Acetate]   . Vioxx [Rofecoxib] Other (See Comments)    headache    ROS: Pertinent items noted in HPI and remainder of comprehensive ROS otherwise negative.  HOME MEDS: Current Outpatient Prescriptions  Medication Sig Dispense Refill  . aspirin 81 MG tablet Take 81 mg by mouth daily.    Marland Kitchen atorvastatin (LIPITOR) 80 MG tablet Take 1 tablet (80 mg total) by mouth daily. 90 tablet 3  . buPROPion (WELLBUTRIN XL) 150 MG 24 hr tablet Take 150 mg by mouth daily.    Marland Kitchen EFFIENT 10 MG TABS tablet Take 10 mg by mouth daily.    Marland Kitchen gabapentin (NEURONTIN) 300 MG capsule Take 300 mg by mouth 2 (two) times daily.    . irbesartan (AVAPRO) 300 MG tablet Take 1 tablet (300 mg total) by mouth daily. 90 tablet 3  . Loratadine (CLARITIN PO) Take by mouth daily as needed.    Marland Kitchen MAGNESIUM PO Take 1.25 mg by mouth  daily.    . pantoprazole (PROTONIX) 40 MG tablet Take 1 tablet (40 mg total) by mouth 2 (two) times daily before a meal. 180 tablet 1  . traZODone (DESYREL) 100 MG tablet Take 100 mg by mouth at bedtime.     Marland Kitchen venlafaxine (EFFEXOR) 75 MG tablet Take 75 mg by mouth daily.     No current facility-administered medications for this visit.     LABS/IMAGING: No results found for this or any previous visit (from the past 48 hour(s)). No results found.  VITALS: BP 132/78   Pulse 63   Ht 5' 9.75" (1.772 m)   Wt 173 lb (78.5 kg)   BMI 25.00 kg/m   EXAM: General appearance: alert and no distress Heart: regular rate and rhythm, S1, S2 normal, no murmur, click, rub or gallop Extremities: extremities normal, atraumatic, no cyanosis or edema and Left knee is without crepitus or effusion, no obvious palpable Baker's cyst Neurologic: Grossly normal  EKG: Normal sinus rhythm at 63, inferior posterior infarct-old, personally reviewed  ASSESSMENT: 1. Recent inferior STEMI, status post 4 DES to the RCA (2017[place] 2. LVEF 45% (12/2016) 3. Coronary artery disease status post Taxus DES to the first diagonal and 2002 4. PTSD 5. Hypertension 6. Dyslipidemia 7. Left posterior knee pain  PLAN: 1.   Cory [name] is doing well without any new symptoms of his coronary artery disease. He's very physically active and has no limitations. He is been switched to irbesartan from valsartan due to the medicine recall. We'll plan to continue dual antiplatelet therapy at least until October see him in early November. That time I may discontinue his Effient. He'll need a repeat echocardiogram next year to see if there is been any improvement in LV function.  Follow-up with me in 3 months.  [name], MD, [facility] Attending Cardiologist Gordon C Shetara Launer 08/07/2017, 4:36 PM

## 2017-08-07 NOTE — Patient Instructions (Addendum)
Your physician wants you to follow-up in: November 2018 with Dr. Debara Pickett

## 2017-08-13 ENCOUNTER — Telehealth: Payer: Self-pay | Admitting: Internal Medicine

## 2017-08-13 ENCOUNTER — Other Ambulatory Visit: Payer: Self-pay

## 2017-08-13 DIAGNOSIS — I10 Essential (primary) hypertension: Secondary | ICD-10-CM

## 2017-08-13 MED ORDER — IRBESARTAN 300 MG PO TABS: 300.0000 mg | ORAL_TABLET | Freq: Every day | ORAL | 3 refills | Status: DC

## 2017-08-13 NOTE — Telephone Encounter (Signed)
°*  STAT* If patient is at the pharmacy, call can be transferred to refill team.   1. Which medications need to be refilled? (please list name of each medication and dose if known) Irbesartan-new prescription  2. Which pharmacy/location (including street and city if local pharmacy) is medication to be sent to?[phone]  3. Do they need a 30 day or 90 day supply? [employer]

## 2017-08-15 DIAGNOSIS — D509 Iron deficiency anemia, unspecified: Secondary | ICD-10-CM | POA: Diagnosis not present

## 2017-08-15 DIAGNOSIS — D508 Other iron deficiency anemias: Secondary | ICD-10-CM | POA: Diagnosis not present

## 2017-08-15 DIAGNOSIS — E782 Mixed hyperlipidemia: Secondary | ICD-10-CM | POA: Diagnosis not present

## 2017-08-15 DIAGNOSIS — I10 Essential (primary) hypertension: Secondary | ICD-10-CM | POA: Diagnosis not present

## 2017-08-15 DIAGNOSIS — R69 Illness, unspecified: Secondary | ICD-10-CM | POA: Diagnosis not present

## 2017-08-15 DIAGNOSIS — R7309 Other abnormal glucose: Secondary | ICD-10-CM | POA: Diagnosis not present

## 2017-08-15 DIAGNOSIS — I219 Acute myocardial infarction, unspecified: Secondary | ICD-10-CM | POA: Diagnosis not present

## 2017-08-15 DIAGNOSIS — I2510 Atherosclerotic heart disease of native coronary artery without angina pectoris: Secondary | ICD-10-CM | POA: Diagnosis not present

## 2017-08-15 DIAGNOSIS — E291 Testicular hypofunction: Secondary | ICD-10-CM | POA: Diagnosis not present

## 2017-08-15 DIAGNOSIS — Z0001 Encounter for general adult medical examination with abnormal findings: Secondary | ICD-10-CM | POA: Diagnosis not present

## 2017-08-15 DIAGNOSIS — Z6825 Body mass index (BMI) 25.0-25.9, adult: Secondary | ICD-10-CM | POA: Diagnosis not present

## 2017-08-25 ENCOUNTER — Telehealth: Payer: Self-pay | Admitting: Internal Medicine

## 2017-08-25 NOTE — Telephone Encounter (Signed)
lmtcb

## 2017-08-25 NOTE — Telephone Encounter (Signed)
New Message    Please call he received 2 calls last week but does not know what they are pertaining too?

## 2017-08-27 NOTE — Telephone Encounter (Signed)
S/w pt he states that he received 2 calls while he was out of town-I do not see that we called him. Pt states that nothing is needed at this time

## 2017-09-17 ENCOUNTER — Ambulatory Visit (INDEPENDENT_AMBULATORY_CARE_PROVIDER_SITE_OTHER): Payer: Medicare HMO | Admitting: Orthopedic Surgery

## 2017-09-17 ENCOUNTER — Encounter: Payer: Self-pay | Admitting: Orthopedic Surgery

## 2017-09-17 DIAGNOSIS — M1712 Unilateral primary osteoarthritis, left knee: Secondary | ICD-10-CM | POA: Diagnosis not present

## 2017-09-17 DIAGNOSIS — M7711 Lateral epicondylitis, right elbow: Secondary | ICD-10-CM

## 2017-09-17 MED ORDER — METHYLPREDNISOLONE ACETATE 40 MG/ML IJ SUSP
40.0000 mg | Freq: Once | INTRAMUSCULAR | Status: AC
  Administered 2017-09-17: 40 mg via INTRA_ARTICULAR

## 2017-09-17 MED ORDER — METHYLPREDNISOLONE ACETATE 40 MG/ML IJ SUSP
20.0000 mg | Freq: Once | INTRAMUSCULAR | Status: AC
  Administered 2017-09-17: 20 mg via INTRAMUSCULAR

## 2017-09-17 NOTE — Progress Notes (Signed)
Routine follow-up patient requests injections  Chief Complaint  Patient presents with  . Knee Pain    left knee   . Elbow Pain    right elbow wants injection    Procedure note injection for right tennis elbow   Diagnosis right tennis elbow  Anesthesia ethyl chloride was used Alcohol use is clean the skin  After we obtained verbal consent and timeout a 25-gauge needle was used to inject 40 mg of Depo-Medrol and 3 cc of 1% lidocaine just distal to the insertion of the ECRB  There were no complications and a sterile bandage was applied.   Procedure note left knee injection verbal consent was obtained to inject left knee joint  Timeout was completed to confirm the site of injection  The medications used were 40 mg of Depo-Medrol and 1% lidocaine 3 cc  Anesthesia was provided by ethyl chloride and the skin was prepped with alcohol.  After cleaning the skin with alcohol a 20-gauge needle was used to inject the left knee joint. There were no complications. A sterile bandage was applied.  Encounter Diagnoses  Name Primary?  Marland Kitchen Arthritis of left knee Yes  . Right tennis elbow

## 2017-09-26 ENCOUNTER — Encounter (INDEPENDENT_AMBULATORY_CARE_PROVIDER_SITE_OTHER): Payer: Self-pay | Admitting: Internal Medicine

## 2017-10-06 ENCOUNTER — Telehealth: Payer: Self-pay | Admitting: *Deleted

## 2017-10-06 NOTE — Telephone Encounter (Signed)
Pt of Dr. Debara Pickett  He dropped off yellow sheet request for information. Note reads "Do I stop effient now or after November 9th appt?  Heart attack was 10-02-16"  Review of recent OV notes, looks like this was discussed but decision to be deferred either to anniversary date or to return appt.   Routed to Dr. Debara Pickett for any advice.

## 2017-10-07 MED ORDER — PRASUGREL HCL 10 MG PO TABS: 10.0000 mg | ORAL_TABLET | Freq: Every day | ORAL | 1 refills | Status: DC

## 2017-10-07 NOTE — Telephone Encounter (Signed)
Pt notified of recommendations, verbalized understanding. Refilled rx to pharmacy per pt request.

## 2017-10-07 NOTE — Telephone Encounter (Signed)
Continue until I see him in November at follow-up.  Thanks  Dr. Lemmie Evens

## 2017-11-04 DIAGNOSIS — H6983 Other specified disorders of Eustachian tube, bilateral: Secondary | ICD-10-CM | POA: Diagnosis not present

## 2017-11-04 DIAGNOSIS — J019 Acute sinusitis, unspecified: Secondary | ICD-10-CM | POA: Diagnosis not present

## 2017-11-04 DIAGNOSIS — Z681 Body mass index (BMI) 19 or less, adult: Secondary | ICD-10-CM | POA: Diagnosis not present

## 2017-11-17 ENCOUNTER — Ambulatory Visit: Payer: Medicare HMO | Admitting: Internal Medicine

## 2017-11-17 ENCOUNTER — Encounter: Payer: Self-pay | Admitting: Internal Medicine

## 2017-11-17 VITALS — BP 118/74 | HR 60 | Ht 69.0 in | Wt 173.8 lb

## 2017-11-17 DIAGNOSIS — I255 Ischemic cardiomyopathy: Secondary | ICD-10-CM

## 2017-11-17 DIAGNOSIS — I2510 Atherosclerotic heart disease of native coronary artery without angina pectoris: Secondary | ICD-10-CM | POA: Diagnosis not present

## 2017-11-17 DIAGNOSIS — E785 Hyperlipidemia, unspecified: Secondary | ICD-10-CM | POA: Diagnosis not present

## 2017-11-17 DIAGNOSIS — I10 Essential (primary) hypertension: Secondary | ICD-10-CM

## 2017-11-17 MED ORDER — IRBESARTAN 300 MG PO TABS: 300.0000 mg | ORAL_TABLET | Freq: Every day | ORAL | 3 refills | Status: DC

## 2017-11-17 NOTE — Patient Instructions (Addendum)
Your physician has recommended you make the following change in your medication: STOP Effient  Your physician has requested that you have an echocardiogram in 6 months @ [address]. Echocardiography is a painless test that uses sound waves to create images of your heart. It provides your doctor with information about the size and shape of your heart and how well your heart's chambers and valves are working. This procedure takes approximately one hour. There are no restrictions for this procedure.  Your physician recommends that you return for lab work in [place] (fasting - to check cholesterol)  Your physician wants you to follow-up in: 6 months with Dr. Debara Pickett. You will receive a reminder letter in the mail two months in advance. If you don't receive a letter, please call our office to schedule the follow-up appointment.

## 2017-11-18 ENCOUNTER — Encounter: Payer: Self-pay | Admitting: Internal Medicine

## 2017-11-18 ENCOUNTER — Encounter (INDEPENDENT_AMBULATORY_CARE_PROVIDER_SITE_OTHER): Payer: Self-pay | Admitting: Internal Medicine

## 2017-11-18 ENCOUNTER — Ambulatory Visit (INDEPENDENT_AMBULATORY_CARE_PROVIDER_SITE_OTHER): Payer: Medicare HMO | Admitting: Internal Medicine

## 2017-11-18 VITALS — BP 122/70 | HR 56 | Temp 97.5°F | Ht 69.0 in | Wt 173.0 lb

## 2017-11-18 DIAGNOSIS — K2270 Barrett's esophagus without dysplasia: Secondary | ICD-10-CM

## 2017-11-18 DIAGNOSIS — I255 Ischemic cardiomyopathy: Secondary | ICD-10-CM | POA: Insufficient documentation

## 2017-11-18 DIAGNOSIS — R1310 Dysphagia, unspecified: Secondary | ICD-10-CM

## 2017-11-18 DIAGNOSIS — R1319 Other dysphagia: Secondary | ICD-10-CM

## 2017-11-18 NOTE — Patient Instructions (Signed)
OV in 1 year.  

## 2017-11-18 NOTE — Progress Notes (Signed)
Subjective:    Patient ID: Cory [name], male    DOB: 1946/11/14, 71 y.o.   MRN: [id number] Wt in November 2017 186. HPI Here today for f/u. Last seen in November of 2017. Hx of Barrett's esophagus and dysphagia.  He tells me he is doing great. His appetite is good. GERD controlled with Protonix. No dysphagia.  Usually has a BM. No melena or BRRB.   Hx significant for CAD and has 4 stents.   08/14/2016 EGD/ED: dysphagia. Reflux, Barrett's esophagus:  Impression: - LA Grade B reflux esophagitis. - Esophageal mucosal changes secondary to  established short-segment Barrett's disease. - Z-line irregular, 38 cm from the incisors. - No endoscopic esophageal abnormality to explain  patient's dysphagia. Esophagus dilated. Dilated. - 2 cm hiatal hernia. - Normal stomach. - Normal duodenal bulb and second portion of the  duodenum.  Review of Systems Past Medical History:  Diagnosis Date  . Anemia   . Arthritis   . Coronary artery disease    a. 09/2001 PCI/BMS to D1: 2.5 x 15 Express 2 BMS;  b. 2011 Cath: stable anatomy; c. 03/2014 Low risk MV, EF 54%;  d. 09/2016 Inf STEMI/PCI [facility]): LM nl, LAD 43m, D1 nl w/ 90% in 58mm inf branch, LCX nl, RCA 99 (Synergy DES x 4 - 3.5x28 prox, 3.5x43m, 2.5x16d, 2.25x16 to RPDA), EF 55%.  Marland Kitchen GERD (gastroesophageal reflux disease)   . Hyperlipidemia   . Hypertensive heart disease   . Pneumonia   . PTSD (post-traumatic stress disorder)     Past Surgical History:  Procedure Laterality Date  . CARDIAC CATHETERIZATION  05/17/2010   left main free of significant lesion; LAD with 60% lesion in mid segment & 60-70% lesion in mid-distal segment with subtotal occlusion at apex & small side  branch of diagonal 1 that is subtotally occluded; ramus intermedius with 90% ostial stenosis; L Cfx w/mild luminal irreg; RCA is dominant vessel with diffuse disease, 50% prox lesion & 60% lesion in mid segment & 60% lesion in PDA  . CATARACT EXTRACTION W/PHACO Right 06/07/2015   Procedure: CATARACT EXTRACTION PHACO AND INTRAOCULAR LENS PLACEMENT (IOC);  Surgeon: Marylynn Pearson, MD;  Location: [place];  Service: Ophthalmology;  Laterality: Right;  . COLONOSCOPY  02/25/2012   Procedure: COLONOSCOPY;  Surgeon: Jamesetta So, MD;  [facility];  Service: Gastroenterology;  Laterality: N/A;  . CORONARY ANGIOPLASTY WITH STENT PLACEMENT  09/2001   Taxus (2.5x3mm) stent to 1st diagonal   . ESOPHAGEAL DILATION N/A 08/14/2016   Procedure: ESOPHAGEAL DILATION;  Surgeon: Rogene Houston, MD;  [facility];  Service: Endoscopy;  Laterality: N/A;  . ESOPHAGOGASTRODUODENOSCOPY  02/25/2012   Procedure: ESOPHAGOGASTRODUODENOSCOPY (EGD);  Surgeon: Jamesetta So, MD;  [facility];  Service: Gastroenterology;  Laterality: N/A;  . ESOPHAGOGASTRODUODENOSCOPY N/A 07/28/2014   Procedure: ESOPHAGOGASTRODUODENOSCOPY (EGD);  Surgeon: Rogene Houston, MD;  [facility];  Service: Endoscopy;  Laterality: N/A;  200-rescheduled to [place] notified pt  . ESOPHAGOGASTRODUODENOSCOPY N/A 08/14/2016   Procedure: ESOPHAGOGASTRODUODENOSCOPY (EGD);  Surgeon: Rogene Houston, MD;  [facility];  Service: Endoscopy;  Laterality: N/A;  100  . EYE SURGERY Bilateral    lasik surgery   . KNEE SURGERY    . LATERAL EPICONDYLE RELEASE Left 01/21/2014   Procedure: Left Tennis Elbow Release with debridement  tendon repair with reattachment;  Surgeon: Carole Civil, MD;  Location[facility];  Service: Orthopedics;  Laterality: Left;  .  penile implant    . PROSTATE SURGERY    . SHOULDER SURGERY    . TONSILLECTOMY    . TRANSTHORACIC ECHOCARDIOGRAM  03/2010   EF=>55%; LV mildly dilated; LA mod  dilated; mild MR & TR; normal RVSP; mild pulm valve regurg    Allergies  Allergen Reactions  . Ace Inhibitors   . Benadryl [Diphenhydramine Hcl]     Opposite reaction of medication purpose - hyper  . Benadryl [Diphenhydramine-Zinc Acetate]   . Vioxx [Rofecoxib] Other (See Comments)    headache    Current Outpatient Medications on File Prior to Visit  Medication Sig Dispense Refill  . aspirin 81 MG tablet Take 81 mg by mouth daily.    Marland Kitchen atorvastatin (LIPITOR) 80 MG tablet Take 1 tablet (80 mg total) by mouth daily. 90 tablet 3  . buPROPion (WELLBUTRIN XL) 150 MG 24 hr tablet Take 150 mg by mouth daily.    Marland Kitchen gabapentin (NEURONTIN) 300 MG capsule Take 300 mg by mouth 2 (two) times daily.    . irbesartan (AVAPRO) 300 MG tablet Take 1 tablet (300 mg total) daily by mouth. 90 tablet 3  . Loratadine (CLARITIN PO) Take by mouth daily as needed.    Marland Kitchen MAGNESIUM PO Take 1.25 mg by mouth daily.    . pantoprazole (PROTONIX) 40 MG tablet Take 1 tablet (40 mg total) by mouth 2 (two) times daily before a meal. 180 tablet 1  . traZODone (DESYREL) 100 MG tablet Take 100 mg by mouth at bedtime.     Marland Kitchen venlafaxine (EFFEXOR) 75 MG tablet Take 75 mg by mouth daily.     No current facility-administered medications on file prior to visit.         Objective:   Physical Exam Blood pressure 122/70, pulse (!) 56, temperature (!) 97.5 F (36.4 C), height 5\' 9"  (1.753 m), weight 173 lb (78.5 kg). Alert and oriented. Skin warm and dry. Oral mucosa is moist.   . Sclera anicteric, conjunctivae is pink. Thyroid not enlarged. No cervical lymphadenopathy. Lungs clear. Heart regular rate and rhythm.  Abdomen is soft. Bowel sounds are positive. No hepatomegaly. No abdominal masses felt. No tenderness.  No edema to lower extremities.   .         Assessment & Plan:  Barrett's esophagus. Continue the Protionix. Dysphagia: Having no problems. Continue the Protonix.

## 2017-11-18 NOTE — Progress Notes (Signed)
OFFICE NOTE  Chief Complaint:  No complaints   Primary Care Physician: Sharilyn Sites, MD  HPI:  Cory [name] is a 71 year old gentleman, who I have been following for coronary artery disease, status post Taxus stenting in 2002. He also has had some ongoing chronic angina controlled on Imdur 60 mg. He also has PTSD, on a number of medications, but that has been stable, as well as hypertension. Today he has a few different complaints, including some increasing urinary frequency. He does have a history of BPH and is followed at the [place] for this. Sounds like he may have had a TURP procedure recently. He is not on any medications for his prostate. In addition he is describing fatigue. This is a new symptom for him and reports he is sleeping much more regularly and often than he did in the past. He's not exercising as much as he had been before. Of note he is on 3 different antidepressant medications for his PTSD, again managed by the [facility].  He reports fairly stable exertional angina however at times he does get more short of breath with exercise. His last stress test was in 2011.  Cory [name] returns today for follow-up. He is without complaint. His blood pressure appears to be well-controlled. He denies any chest pain and is actually had very little need to take any short acting nitroglycerin since he's been on Imdur. He remains active and has no shortness of breath.  01/08/2017  Cory [name] returns today for follow-up. He was seen in October by Murray Hodgkins, NP, for follow-up of an ST elevation MI. He was vacationing in the [facility] area and presented to [facility] hospital after having ST elevation. He underwent emergent Catheterization and was found to have subtotal occlusion of the right coronary artery. He received 4 drug-eluting stents and was discharged on Effient, aspirin, atenolol and simvastatin. Subsequent to that he is done fairly well without any recurrent chest  pain. He does get a little mild dyspnea with exertion. He did not participate in cardiac rehabilitation. He was noted to be markedly bradycardic and follow-up and his atenolol was discontinued. His simvastatin was discontinued in favor of a highly active statin, namely Lipitor 80 mg daily. A repeat lipid profile was performed on 12/04/2016 showing total cluster 105, triglycerides 63, HDL-C 49, and LDL-C 43. Is is excellent control and I would continue with his current dose. Also of note, his blood pressure is elevated today. He says that it has remained elevated since discontinuing his atenolol.  01/24/2017  Cory [name] returns for follow-up of his echo. Recently that demonstrated an EF of 45% which is reduced from his prior studies. This is related to his recent STEMI. Hopefully he'll have some recovery of his LV function in time. I did switch him from losartan 100 up to valsartan 320 since his blood pressure was not at goal. Now his blood pressure is much improved at 130/82. He feels really well other than he has some pain behind his left knee. He reports to stiffness and difficulty in extending it. He does not recall injuring it. He wondered if this is related to his cholesterol medicine. Of note he is been on a statin medication now for several months.  08/07/2017  Cory [name] was seen today in follow-up. Over the last few months she's had no new complaints. He denies any chest pain or worsening shortness of breath. He was seen about his knee pain and told  he has arthritis. His EF was 45% on echo and recently was subjected to a valsartan and recall. He was switched over to irbesartan and seems to be tolerating that well. Blood pressure is reasonably well controlled. He should be on dual antiplatelet therapy and at least until October 2018.  11/18/2017  Cory [name] returns today for follow-up.  Overall is without complaints.  EKG shows sinus rhythm at 60.  He is now 1 year since his ST elevation MI.  We had  discussed possibly discontinuing his Effient.  He denies any further chest pain.  He remains physically active.  PMHx:  Past Medical History:  Diagnosis Date  . Anemia   . Arthritis   . Coronary artery disease    a. 09/2001 PCI/BMS to D1: 2.5 x 15 Express 2 BMS;  b. 2011 Cath: stable anatomy; c. 03/2014 Low risk MV, EF 54%;  d. 09/2016 Inf STEMI/PCI [facility]): LM nl, LAD 69m, D1 nl w/ 90% in 79mm inf branch, LCX nl, RCA 99 (Synergy DES x 4 - 3.5x28 prox, 3.5x52m, 2.5x16d, 2.25x16 to RPDA), EF 55%.  Marland Kitchen GERD (gastroesophageal reflux disease)   . Hyperlipidemia   . Hypertensive heart disease   . Pneumonia   . PTSD (post-traumatic stress disorder)     Past Surgical History:  Procedure Laterality Date  . CARDIAC CATHETERIZATION  05/17/2010   left main free of significant lesion; LAD with 60% lesion in mid segment & 60-70% lesion in mid-distal segment with subtotal occlusion at apex & small side branch of diagonal 1 that is subtotally occluded; ramus intermedius with 90% ostial stenosis; L Cfx w/mild luminal irreg; RCA is dominant vessel with diffuse disease, 50% prox lesion & 60% lesion in mid segment & 60% lesion in PDA  . CATARACT EXTRACTION W/PHACO Right 06/07/2015   Procedure: CATARACT EXTRACTION PHACO AND INTRAOCULAR LENS PLACEMENT (IOC);  Surgeon: Marylynn Pearson, MD;  Location: [place];  Service: Ophthalmology;  Laterality: Right;  . COLONOSCOPY  02/25/2012   Procedure: COLONOSCOPY;  Surgeon: Jamesetta So, MD;  [facility];  Service: Gastroenterology;  Laterality: N/A;  . CORONARY ANGIOPLASTY WITH STENT PLACEMENT  09/2001   Taxus (2.5x60mm) stent to 1st diagonal   . ESOPHAGEAL DILATION N/A 08/14/2016   Procedure: ESOPHAGEAL DILATION;  Surgeon: Rogene Houston, MD;  [facility];  Service: Endoscopy;  Laterality: N/A;  . ESOPHAGOGASTRODUODENOSCOPY  02/25/2012   Procedure: ESOPHAGOGASTRODUODENOSCOPY (EGD);  Surgeon: Jamesetta So, MD;  [facility];  Service: Gastroenterology;  Laterality: N/A;  . ESOPHAGOGASTRODUODENOSCOPY N/A 07/28/2014   Procedure: ESOPHAGOGASTRODUODENOSCOPY (EGD);  Surgeon: Rogene Houston, MD;  [facility];  Service: Endoscopy;  Laterality: N/A;  200-rescheduled to [place] notified pt  . ESOPHAGOGASTRODUODENOSCOPY N/A 08/14/2016   Procedure: ESOPHAGOGASTRODUODENOSCOPY (EGD);  Surgeon: Rogene Houston, MD;  [facility];  Service: Endoscopy;  Laterality: N/A;  100  . EYE SURGERY Bilateral    lasik surgery   . KNEE SURGERY    . LATERAL EPICONDYLE RELEASE Left 01/21/2014   Procedure: Left Tennis Elbow Release with debridement  tendon repair with reattachment;  Surgeon: Carole Civil, MD;  Location: [facility];  Service: Orthopedics;  Laterality: Left;  . penile implant    . PROSTATE SURGERY    . SHOULDER SURGERY    . TONSILLECTOMY    . TRANSTHORACIC ECHOCARDIOGRAM  03/2010   EF=>55%; LV mildly dilated; LA mod dilated; mild MR & TR; normal RVSP; mild pulm  valve regurg    FAMHx:  Family History  Problem Relation Age of Onset  . Colitis Maternal Aunt     SOCHx:   reports that  has never smoked. he has never used smokeless tobacco. He reports that he does not drink alcohol or use drugs.  ALLERGIES:  Allergies  Allergen Reactions  . Ace Inhibitors   . Benadryl [Diphenhydramine Hcl]     Opposite reaction of medication purpose - hyper  . Benadryl [Diphenhydramine-Zinc Acetate]   . Vioxx [Rofecoxib] Other (See Comments)    headache    ROS: Pertinent items noted in HPI and remainder of comprehensive ROS otherwise negative.  HOME MEDS: Current Outpatient Medications  Medication Sig Dispense Refill  . aspirin 81 MG tablet Take 81 mg by mouth daily.    Marland Kitchen atorvastatin (LIPITOR) 80 MG tablet Take 1 tablet (80 mg total) by mouth daily. 90 tablet 3  . buPROPion (WELLBUTRIN XL) 150 MG 24 hr tablet Take 150 mg by mouth daily.    Marland Kitchen gabapentin (NEURONTIN) 300 MG capsule Take 300 mg by  mouth 2 (two) times daily.    . irbesartan (AVAPRO) 300 MG tablet Take 1 tablet (300 mg total) daily by mouth. 90 tablet 3  . Loratadine (CLARITIN PO) Take by mouth daily as needed.    Marland Kitchen MAGNESIUM PO Take 1.25 mg by mouth daily.    . pantoprazole (PROTONIX) 40 MG tablet Take 1 tablet (40 mg total) by mouth 2 (two) times daily before a meal. 180 tablet 1  . traZODone (DESYREL) 100 MG tablet Take 100 mg by mouth at bedtime.     Marland Kitchen venlafaxine (EFFEXOR) 75 MG tablet Take 75 mg by mouth daily.     No current facility-administered medications for this visit.     LABS/IMAGING: No results found for this or any previous visit (from the past 48 hour(s)). No results found.  VITALS: BP 118/74   Pulse 60   Ht 5\' 9"  (1.753 m)   Wt 173 lb 12.8 oz (78.8 kg)   BMI 25.67 kg/m   EXAM: General appearance: alert and no distress Heart: regular rate and rhythm, S1, S2 normal, no murmur, click, rub or gallop Extremities: extremities normal, atraumatic, no cyanosis or edema and Left knee is without crepitus or effusion, no obvious palpable Baker's cyst Neurologic: Grossly normal  EKG: Normal sinus rhythm at 60, inferior posterior infarct (old)-personally reviewed  ASSESSMENT: 1. Inferior STEMI, status post 4 DES to the RCA (2017)-[place] 2. LVEF 45% (12/2016) 3. Coronary artery disease status post Taxus DES to the first diagonal and 2002 4. PTSD 5. Hypertension 6. Dyslipidemia  PLAN: 1.   Cory [name] T should do well without recurrent angina.  His LVEF has come up to 45% in January 2018.  Is now been over a year since his PCI and I feel that he could discontinue Effient.  He should remain on aspirin lifelong.  Blood pressure is well controlled.  His cholesterol needs to be reassessed.  We will also repeat an echocardiogram in 6 months and plan follow-up then.   [name], MD, [facility], [place] Director of the Advanced Lipid Disorders &  Cardiovascular  Risk Reduction [facility] Attending Cardiologist  Direct Dial: [phone]  Fax: [phone]  Website:  [email or web address] 11/18/2017, 6:35 PM

## 2018-01-02 ENCOUNTER — Ambulatory Visit (INDEPENDENT_AMBULATORY_CARE_PROVIDER_SITE_OTHER): Payer: Medicare HMO

## 2018-01-02 ENCOUNTER — Telehealth: Payer: Self-pay | Admitting: Radiology

## 2018-01-02 ENCOUNTER — Encounter: Payer: Self-pay | Admitting: Orthopedic Surgery

## 2018-01-02 ENCOUNTER — Ambulatory Visit: Payer: Medicare HMO | Admitting: Orthopedic Surgery

## 2018-01-02 ENCOUNTER — Telehealth: Payer: Self-pay | Admitting: Internal Medicine

## 2018-01-02 VITALS — BP 126/79 | HR 60 | Ht 69.0 in | Wt 177.0 lb

## 2018-01-02 DIAGNOSIS — Z981 Arthrodesis status: Secondary | ICD-10-CM

## 2018-01-02 DIAGNOSIS — M545 Low back pain, unspecified: Secondary | ICD-10-CM

## 2018-01-02 DIAGNOSIS — M7711 Lateral epicondylitis, right elbow: Secondary | ICD-10-CM | POA: Diagnosis not present

## 2018-01-02 DIAGNOSIS — M79605 Pain in left leg: Secondary | ICD-10-CM | POA: Diagnosis not present

## 2018-01-02 DIAGNOSIS — M7122 Synovial cyst of popliteal space [Baker], left knee: Secondary | ICD-10-CM

## 2018-01-02 DIAGNOSIS — M25562 Pain in left knee: Secondary | ICD-10-CM

## 2018-01-02 NOTE — Telephone Encounter (Signed)
Patient wants you to give him a call at home.  [phone]

## 2018-01-02 NOTE — Telephone Encounter (Signed)
Returned call to patient's wife.Stated she wanted to ask Dr.Hilty if ok for husband to have MRI of his knee with heart stents.Advised I will send message to Dr.Hilty.

## 2018-01-02 NOTE — Addendum Note (Signed)
Addended byCandice Camp on: 01/02/2018 11:20 AM   Modules accepted: Orders

## 2018-01-02 NOTE — Progress Notes (Signed)
Chief Complaint  Patient presents with  . Knee Pain    left  . Elbow Pain    right      72 year old male history of right elbow pain injected for tennis elbow and history of left knee pain injected with some relief  Now he complains of persistent pain lateral side right elbow worse with activity relieved somewhat with rest.  It is a dull aching pain worsened with wrist extension  Left knee pain especially with kneeling but it is in the back of the knee and its associated with a fullness and a feeling that there is cyst back there.  This pain is exacerbated also when the patient has had the knee sitting still for several minutes relieved once he gets going and walking there is no catching locking or giving way  He has a history of coronary artery disease status post stent placement  History of lumbar fusion  Review of Systems  Constitutional: Negative for fever.  Respiratory: Negative for shortness of breath.   Cardiovascular: Negative for chest pain.  Musculoskeletal: Positive for back pain and joint pain.  Skin: Negative for itching and rash.  Neurological: Negative for tingling and sensory change.     Past Medical History:  Diagnosis Date  . Anemia   . Arthritis   . Coronary artery disease    a. 09/2001 PCI/BMS to D1: 2.5 x 15 Express 2 BMS;  b. 2011 Cath: stable anatomy; c. 03/2014 Low risk MV, EF 54%;  d. 09/2016 Inf STEMI/PCI [facility]): LM nl, LAD 57m D1 nl w/ 90% in 129minf branch, LCX nl, RCA 99 (Synergy DES x 4 - 3.5x28 prox, 3.5x3870m.5x16d, 2.25x16 to RPDA), EF 55%.  . GMarland KitchenRD (gastroesophageal reflux disease)   . Hyperlipidemia   . Hypertensive heart disease   . Pneumonia   . PTSD (post-traumatic stress disorder)    BP 126/79   Pulse 60   Ht '5\' 9"'$  (1.753 m)   Wt 177 lb (80.3 kg)   BMI 26.14 kg/m  Physical Exam  Constitutional: He is oriented to person, place, and time. He appears well-developed and well-nourished.  Musculoskeletal:   Left elbow: Normal.       Right knee: Normal.       Arms:      Legs: Neurological: He is alert and oriented to person, place, and time. Gait normal.  Psychiatric: He has a normal mood and affect.   X-rays taken lumbar spine  Radiology report as dictated by Dr. HarAline Brochureatient has extensive lumbar spine disease in multiple areas with mild coronal plane scoliosis.  Multiple osteophytes are noted between L3 and 4 L4 and 5 with apex of the scoliosis at 3 4 to the right large osteophyte disc complex L3-4 on the left  Mild pelvic height differentiation left higher than right  Extremely straight lumbar spine with exacerbation of the findings noted prior.  Appears to have had an L5-S1 procedure and there is disease at L4 L3 L2 L1 and loss of lordosis.    Impression extensive lumbar spondylosis and degenerative scoliosis  Encounter Diagnoses  Name Primary?  . Lumbar back pain Yes  . Posterior left knee pain   . Right tennis elbow   . Baker's cyst of knee, left   . History of lumbar spinal fusion     Plan lumbar spine at this time will be observed  Posterior left knee pain and mass patient will need MRI to evaluate  I will  inject his left knee and his right tennis elbow  Procedures:   Procedure note left knee injection verbal consent was obtained to inject left knee joint  Timeout was completed to confirm the site of injection  The medications used were 40 mg of Depo-Medrol and 1% lidocaine 3 cc  Anesthesia was provided by ethyl chloride and the skin was prepped with alcohol.  After cleaning the skin with alcohol a 20-gauge needle was used to inject the left knee joint. There were no complications. A sterile bandage was applied.  Procedure note injection for right tennis elbow   Diagnosis right tennis elbow  Anesthesia ethyl chloride was used Alcohol use is clean the skin  After we obtained verbal consent and timeout a 25-gauge needle was used to inject 40 mg of  Depo-Medrol and 3 cc of 1% lidocaine just distal to the insertion of the ECRB  There were no complications and a sterile bandage was applied.

## 2018-01-02 NOTE — Telephone Encounter (Signed)
New message    Patient calling to confirm the type of stents he has. Patient planning on getting MRI of his knee. Please call

## 2018-01-02 NOTE — Telephone Encounter (Signed)
Patient has a stent, he will ck and see if it is MRI compatible , he has a card in his wallet, he will call me back and let me know what card indicates.

## 2018-01-02 NOTE — Telephone Encounter (Signed)
Patient has card in his Cory [name] in regards to his stent he will bring it with him to the MRI scan

## 2018-01-03 NOTE — Telephone Encounter (Signed)
Yes .. Ok to have MRI with heart stents.  Dr. Lemmie Evens

## 2018-01-05 NOTE — Telephone Encounter (Signed)
LMTCB

## 2018-01-06 NOTE — Telephone Encounter (Signed)
Patient made aware.

## 2018-01-06 NOTE — Telephone Encounter (Signed)
Follow up  Patient is returning call that he received in reference to MRI. Please call

## 2018-01-08 ENCOUNTER — Ambulatory Visit (HOSPITAL_COMMUNITY)
Admission: RE | Admit: 2018-01-08 | Discharge: 2018-01-08 | Disposition: A | Discharge status: Home / Self Care | Payer: Medicare HMO | Source: Ambulatory Visit | Attending: Orthopedic Surgery | Admitting: Orthopedic Surgery

## 2018-01-08 DIAGNOSIS — M25462 Effusion, left knee: Secondary | ICD-10-CM | POA: Insufficient documentation

## 2018-01-08 DIAGNOSIS — M25562 Pain in left knee: Secondary | ICD-10-CM | POA: Diagnosis not present

## 2018-01-08 DIAGNOSIS — M7122 Synovial cyst of popliteal space [Baker], left knee: Secondary | ICD-10-CM | POA: Insufficient documentation

## 2018-01-08 DIAGNOSIS — S83242A Other tear of medial meniscus, current injury, left knee, initial encounter: Secondary | ICD-10-CM | POA: Diagnosis not present

## 2018-01-08 DIAGNOSIS — M899 Disorder of bone, unspecified: Secondary | ICD-10-CM | POA: Insufficient documentation

## 2018-01-08 DIAGNOSIS — X58XXXA Exposure to other specified factors, initial encounter: Secondary | ICD-10-CM | POA: Insufficient documentation

## 2018-01-16 ENCOUNTER — Encounter: Payer: Self-pay | Admitting: Orthopedic Surgery

## 2018-01-16 ENCOUNTER — Ambulatory Visit (INDEPENDENT_AMBULATORY_CARE_PROVIDER_SITE_OTHER): Payer: Medicare HMO | Admitting: Orthopedic Surgery

## 2018-01-16 VITALS — BP 148/75 | HR 72 | Ht 69.0 in | Wt 181.0 lb

## 2018-01-16 DIAGNOSIS — M1712 Unilateral primary osteoarthritis, left knee: Secondary | ICD-10-CM

## 2018-01-16 DIAGNOSIS — M23322 Other meniscus derangements, posterior horn of medial meniscus, left knee: Secondary | ICD-10-CM | POA: Diagnosis not present

## 2018-01-16 NOTE — Progress Notes (Signed)
Patient ID: Cory [name], male   DOB: Jul 30, 1946, 72 y.o.   MRN: [id number]  MRI FOLLOW UP  Chief Complaint  Patient presents with  . Knee Pain    left    HPI Cory [name] is a 72 y.o. male.    The patient has had MRI of the knee  Left knee pain especially with kneeling but it is in the back of the knee and its associated with a fullness and a feeling that there is cyst back there.  This pain is exacerbated also when the patient has had the knee sitting still for several minutes relieved once he gets going and walking there is no catching locking or giving way   He has a history of coronary artery disease status post stent placement   History of lumbar fusion    Review of Systems  Constitutional: Negative for chills, fever and weight loss.  Respiratory: Negative for shortness of breath.   Cardiovascular: Negative for chest pain.  Neurological: Negative for tingling.    Physical Exam BP (!) 148/75   Pulse 72   Ht '5\' 9"'$  (1.753 m)   Wt 181 lb (82.1 kg)   BMI 26.73 kg/m  Previous exams have revealed that he does have tenderness on the medial joint line although he has free range of motion no ligaments were torn on exam his strength is normal skin was intact pulses are good sensation was normal as well.  He was walking without support Data  No diagnosis found. THE PROBLEM IS WORSE   My Independent image interpretation of the MRI showed was osteoarthritis with torn medial meniscus  The report was read as follows   IMPRESSION: 1. Oblique and free edge tear of the posterior horn medial meniscus primarily involving the inferior surface. 2. Grade 2 signal in the midbody lateral meniscus. 3. Expanded ACL with increased internal signal compatible with ACL cyst or prominent degeneration. 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 4. Mild to moderate degrees of degenerative chondral thinning. Small non-fragmented free osteochondral lesion at the lateral  femoral trochlear groove. 5. Small Baker's cyst with adjacent semimembranosus-tibial collateral ligament bursitis. 6. Trace knee effusion.     Electronically Signed   By: Van Clines M.D.   On: 01/08/2018 15:13   The plan is to arthroscopic surgery left knee partial medial meniscectomy  The procedure has been fully reviewed with the patient; The risks and benefits of surgery have been discussed and explained and understood. Alternative treatment has also been reviewed, questions were encouraged and answered. The postoperative plan is also been reviewed.   [name], MD 01/16/2018 8:56 AM

## 2018-01-27 DIAGNOSIS — Z7982 Long term (current) use of aspirin: Secondary | ICD-10-CM | POA: Diagnosis not present

## 2018-01-27 DIAGNOSIS — I252 Old myocardial infarction: Secondary | ICD-10-CM | POA: Diagnosis not present

## 2018-01-27 DIAGNOSIS — Z8249 Family history of ischemic heart disease and other diseases of the circulatory system: Secondary | ICD-10-CM | POA: Diagnosis not present

## 2018-01-27 DIAGNOSIS — G629 Polyneuropathy, unspecified: Secondary | ICD-10-CM | POA: Diagnosis not present

## 2018-01-27 DIAGNOSIS — K219 Gastro-esophageal reflux disease without esophagitis: Secondary | ICD-10-CM | POA: Diagnosis not present

## 2018-01-27 DIAGNOSIS — I10 Essential (primary) hypertension: Secondary | ICD-10-CM | POA: Diagnosis not present

## 2018-01-27 DIAGNOSIS — R69 Illness, unspecified: Secondary | ICD-10-CM | POA: Diagnosis not present

## 2018-01-27 DIAGNOSIS — E785 Hyperlipidemia, unspecified: Secondary | ICD-10-CM | POA: Diagnosis not present

## 2018-01-27 DIAGNOSIS — G4700 Insomnia, unspecified: Secondary | ICD-10-CM | POA: Diagnosis not present

## 2018-01-27 DIAGNOSIS — I2510 Atherosclerotic heart disease of native coronary artery without angina pectoris: Secondary | ICD-10-CM | POA: Diagnosis not present

## 2018-01-27 NOTE — Patient Instructions (Signed)
Cory [name]  01/27/2018     '@PREFPERIOPPHARMACY'$ @   Your procedure is scheduled on  02/05/2018   Report to [facility] at  825  A.M.  Call this number if you have problems the morning of surgery:  [phone]   Remember:  Do not eat food or drink liquids after midnight.  Take these medicines the morning of surgery with A SIP OF WATER  Wellbutrin, neurontin, avapro, claritin, protonix, effexor.   Do not wear jewelry, make-up or nail polish.  Do not wear lotions, powders, or perfumes, or deodorant.  Do not shave 48 hours prior to surgery.  Men may shave face and neck.  Do not bring valuables to the hospital.  [facility] is not responsible for any belongings or valuables.  Contacts, dentures or bridgework may not be worn into surgery.  Leave your suitcase in the car.  After surgery it may be brought to your room.  For patients admitted to the hospital, discharge time will be determined by your treatment team.  Patients discharged the day of surgery will not be allowed to drive home.   Name and phone number of your driver:   family Special instructions:  None  Please read over the following fact sheets that you were given. Anesthesia Post-op Instructions and Care and Recovery After Surgery      Knee Ligament Injury, Arthroscopy Arthroscopy is a surgical technique in which your health care provider examines your knee through a small, pencil-sized telescope (arthroscope). Often, repairs to injured ligaments can be done with instruments in the arthroscope. Arthroscopy is less invasive than open-knee surgery. Tell a health care provider about:  Any allergies you have.  All medicines you are taking, including vitamins, herbs, eye drops, creams, and over-the-counter medicines.  Any problems you or family members have had with anesthetic medicines.  Any blood disorders you have.  Any surgeries you have had.  Any medical conditions you have. What are the  risks? Generally, this is a safe procedure. However, as with any procedure, problems can occur. Possible problems include:  Infection.  Bleeding.  Stiffness.  What happens before the procedure?  Ask your health care provider about changing or stopping any regular medicines. Avoid taking aspirin or blood thinners as directed by your health care provider.  Do not eat or drink anything after midnight the night before surgery.  If you smoke, do not smoke for at least 2 weeks before your surgery.  Do not drink alcohol starting the day before your surgery.  Let your health care provider know if you develop a cold or any infection before your surgery.  Arrange for someone to drive you home after the surgery or after your hospital stay. Also arrange for someone to help you with activities during recovery. What happens during the procedure?  Small monitors will be put on your body. They are used to check your heart, blood pressure, and oxygen levels.  An IV access tube will be put into one of your veins. Medicine will be able to flow directly into your body through this IV tube.  You might be given a medicine to help you relax (sedative).  You will be given a medicine that makes you go to sleep (general anesthetic), and a breathing tube will be placed into your lungs during the procedure.  Several small incisions are made in your knee. Saline fluid is placed into one of the incisions to expand the  knee and clear away any blood in the knee.  Your health care provider will insert the arthroscope to examine the injured knee.  During arthroscopy, your health care provider may find a partial or complete tear in a ligament.  Tools can be inserted through the other incisions to repair the injured ligaments.  The incisions are then closed with absorbable stitches and covered with dressings. What happens after the procedure?  You will be taken to the recovery area where you will be  monitored.  When you are awake, stable, and taking fluids without problems, you will be allowed to go home. This information is not intended to replace advice given to you by your health care provider. Make sure you discuss any questions you have with your health care provider. Document Released: 12/13/2000 Document Revised: 05/23/2016 Document Reviewed: 07/28/2013 Elsevier Interactive Patient Education  2017 [place].  Arthroscopic Knee Ligament Repair, Care After This sheet gives you information about how to care for yourself after your procedure. Your health care provider may also give you more specific instructions. If you have problems or questions, contact your health care provider. What can I expect after the procedure? After the procedure, it is common to have:  Pain in your knee.  Bruising and swelling on your knee, calf, and ankle for 3-4 days.  Fatigue.  Follow these instructions at home: If you have a brace or immobilizer:  Wear the brace or immobilizer as told by your health care provider. Remove it only as told by your health care provider.  Loosen the splint or immobilizer if your toes tingle, become numb, or turn cold and blue.  Keep the brace or immobilizer clean. Bathing  Do not take baths, swim, or use a hot tub until your health care provider approves. Ask your health care provider if you can take showers.  Keep your bandage (dressing) dry until your health care provider says that it can be removed. Cover it and your brace or immobilizer with a watertight covering when you take a shower. Incision care  Follow instructions from your health care provider about how to take care of your incision. Make sure you: ? Wash your hands with soap and water before you change your bandage (dressing). If soap and water are not available, use hand sanitizer. ? Change your dressing as told by your health care provider. ? Leave stitches (sutures), skin glue, or adhesive  strips in place. These skin closures may need to stay in place for 2 weeks or longer. If adhesive strip edges start to loosen and curl up, you may trim the loose edges. Do not remove adhesive strips completely unless your health care provider tells you to do that.  Check your incision area every day for signs of infection. Check for: ? More redness, swelling, or pain. ? More fluid or blood. ? Warmth. ? Pus or a bad smell. Managing pain, stiffness, and swelling  If directed, put ice on the affected area. ? If you have a removable brace or immobilizer, remove it as told by your health care provider. ? Put ice in a plastic bag. ? Place a towel between your skin and the bag or between your brace or immobilizer and the bag. ? Leave the ice on for 20 minutes, 2-3 times a day.  Move your toes often to avoid stiffness and to lessen swelling.  Raise (elevate) the injured area above the level of your heart while you are sitting or lying down. Driving  Do  not drive until your health care provider approves. If you have a brace or immobilizer on your leg, ask your health care provider when it is safe for you to drive.  Do not drive or use heavy machinery while taking prescription pain medicine. Activity  Rest as directed. Ask your health care provider what activities are safe for you.  Do physical therapy exercises as told by your health care provider. Physical therapy will help you regain strength and motion in your knee.  Follow instructions from your health care provider about: ? When you may start motion exercises. ? When you may start riding a stationary bike and doing other low-impact activities. ? When you may start to jog and do other high-impact activities. Safety  Do not use the injured limb to support your body weight until your health care provider says that you can. Use crutches as told by your health care provider. General instructions  Do not use any products that contain  nicotine or tobacco, such as cigarettes and e-cigarettes. These can delay bone healing. If you need help quitting, ask your health care provider.  To prevent or treat constipation while you are taking prescription pain medicine, your health care provider may recommend that you: ? Drink enough fluid to keep your urine clear or pale yellow. ? Take over-the-counter or prescription medicines. ? Eat foods that are high in fiber, such as fresh fruits and vegetables, whole grains, and beans. ? Limit foods that are high in fat and processed sugars, such as fried and sweet foods.  Take over-the-counter and prescription medicines only as told by your health care provider.  Keep all follow-up visits as told by your health care provider. This is important. Contact a health care provider if:  You have more redness, swelling, or pain around an incision.  You have more fluid or blood coming from an incision.  Your incision feels warm to the touch.  You have a fever.  You have pain or swelling in your knee, and it gets worse.  You have pain that does not get better with medicine. Get help right away if:  You have trouble breathing.  You have pus or a bad smell coming from an incision.  You have numbness and tingling near the knee joint. Summary  After the procedure, it is common to have knee pain with bruising and swelling on your knee, calf, and ankle.  Icing your knee and raising your leg above the level of your heart will help control the pain and the swelling.  Do physical therapy exercises as told by your health care provider. Physical therapy will help you regain strength and motion in your knee. This information is not intended to replace advice given to you by your health care provider. Make sure you discuss any questions you have with your health care provider. Document Released: 10/06/2013 Document Revised: 12/10/2016 Document Reviewed: 12/10/2016 Elsevier Interactive Patient  Education  2017 [place] Anesthesia, Adult General anesthesia is the use of medicines to make a person "go to sleep" (be unconscious) for a medical procedure. General anesthesia is often recommended when a procedure:  Is long.  Requires you to be still or in an unusual position.  Is major and can cause you to lose blood.  Is impossible to do without general anesthesia.  The medicines used for general anesthesia are called general anesthetics. In addition to making you sleep, the medicines:  Prevent pain.  Control your blood pressure.  Relax your  muscles.  Tell a health care provider about:  Any allergies you have.  All medicines you are taking, including vitamins, herbs, eye drops, creams, and over-the-counter medicines.  Any problems you or family members have had with anesthetic medicines.  Types of anesthetics you have had in the past.  Any bleeding disorders you have.  Any surgeries you have had.  Any medical conditions you have.  Any history of heart or lung conditions, such as heart failure, sleep apnea, or chronic obstructive pulmonary disease (COPD).  Whether you are pregnant or may be pregnant.  Whether you use tobacco, alcohol, marijuana, or street drugs.  Any history of Armed forces logistics/support/administrative officer.  Any history of depression or anxiety. What are the risks? Generally, this is a safe procedure. However, problems may occur, including:  Allergic reaction to anesthetics.  Lung and heart problems.  Inhaling food or liquids from your stomach into your lungs (aspiration).  Injury to nerves.  Waking up during your procedure and being unable to move (rare).  Extreme agitation or a state of mental confusion (delirium) when you wake up from the anesthetic.  Air in the bloodstream, which can lead to stroke.  These problems are more likely to develop if you are having a major surgery or if you have an advanced medical condition. You can prevent some of  these complications by answering all of your health care provider's questions thoroughly and by following all pre-procedure instructions. General anesthesia can cause side effects, including:  Nausea or vomiting  A sore throat from the breathing tube.  Feeling cold or shivery.  Feeling tired, washed out, or achy.  Sleepiness or drowsiness.  Confusion or agitation.  What happens before the procedure? Staying hydrated Follow instructions from your health care provider about hydration, which may include:  Up to 2 hours before the procedure - you may continue to drink clear liquids, such as water, clear fruit juice, black coffee, and plain tea.  Eating and drinking restrictions Follow instructions from your health care provider about eating and drinking, which may include:  8 hours before the procedure - stop eating heavy meals or foods such as meat, fried foods, or fatty foods.  6 hours before the procedure - stop eating light meals or foods, such as toast or cereal.  6 hours before the procedure - stop drinking milk or drinks that contain milk.  2 hours before the procedure - stop drinking clear liquids.  Medicines  Ask your health care provider about: ? Changing or stopping your regular medicines. This is especially important if you are taking diabetes medicines or blood thinners. ? Taking medicines such as aspirin and ibuprofen. These medicines can thin your blood. Do not take these medicines before your procedure if your health care provider instructs you not to. ? Taking new dietary supplements or medicines. Do not take these during the week before your procedure unless your health care provider approves them.  If you are told to take a medicine or to continue taking a medicine on the day of the procedure, take the medicine with sips of water. General instructions   Ask if you will be going home the same day, the following day, or after a longer hospital stay. ? Plan to  have someone take you home. ? Plan to have someone stay with you for the first 24 hours after you leave the hospital or clinic.  For 3-6 weeks before the procedure, try not to use any tobacco products, such as cigarettes, chewing tobacco,  and e-cigarettes.  You may brush your teeth on the morning of the procedure, but make sure to spit out the toothpaste. What happens during the procedure?  You will be given anesthetics through a mask and through an IV tube in one of your veins.  You may receive medicine to help you relax (sedative).  As soon as you are asleep, a breathing tube may be used to help you breathe.  An anesthesia specialist will stay with you throughout the procedure. He or she will help keep you comfortable and safe by continuing to give you medicines and adjusting the amount of medicine that you get. He or she will also watch your blood pressure, pulse, and oxygen levels to make sure that the anesthetics do not cause any problems.  If a breathing tube was used to help you breathe, it will be removed before you wake up. The procedure may vary among health care providers and hospitals. What happens after the procedure?  You will wake up, often slowly, after the procedure is complete, usually in a recovery area.  Your blood pressure, heart rate, breathing rate, and blood oxygen level will be monitored until the medicines you were given have worn off.  You may be given medicine to help you calm down if you feel anxious or agitated.  If you will be going home the same day, your health care provider may check to make sure you can stand, drink, and urinate.  Your health care providers will treat your pain and side effects before you go home.  Do not drive for 24 hours if you received a sedative.  You may: ? Feel nauseous and vomit. ? Have a sore throat. ? Have mental slowness. ? Feel cold or shivery. ? Feel sleepy. ? Feel tired. ? Feel sore or achy, even in parts of your  body where you did not have surgery. This information is not intended to replace advice given to you by your health care provider. Make sure you discuss any questions you have with your health care provider. Document Released: 03/24/2008 Document Revised: 05/28/2016 Document Reviewed: 11/30/2015 Elsevier Interactive Patient Education  2018 [place] Anesthesia, Adult, Care After These instructions provide you with information about caring for yourself after your procedure. Your health care provider may also give you more specific instructions. Your treatment has been planned according to current medical practices, but problems sometimes occur. Call your health care provider if you have any problems or questions after your procedure. What can I expect after the procedure? After the procedure, it is common to have:  Vomiting.  A sore throat.  Mental slowness.  It is common to feel:  Nauseous.  Cold or shivery.  Sleepy.  Tired.  Sore or achy, even in parts of your body where you did not have surgery.  Follow these instructions at home: For at least 24 hours after the procedure:  Do not: ? Participate in activities where you could fall or become injured. ? Drive. ? Use heavy machinery. ? Drink alcohol. ? Take sleeping pills or medicines that cause drowsiness. ? Make important decisions or sign legal documents. ? Take care of children on your own.  Rest. Eating and drinking  If you vomit, drink water, juice, or soup when you can drink without vomiting.  Drink enough fluid to keep your urine clear or pale yellow.  Make sure you have little or no nausea before eating solid foods.  Follow the diet recommended by your health care  provider. General instructions  Have a responsible adult stay with you until you are awake and alert.  Return to your normal activities as told by your health care provider. Ask your health care provider what activities are safe for  you.  Take over-the-counter and prescription medicines only as told by your health care provider.  If you smoke, do not smoke without supervision.  Keep all follow-up visits as told by your health care provider. This is important. Contact a health care provider if:  You continue to have nausea or vomiting at home, and medicines are not helpful.  You cannot drink fluids or start eating again.  You cannot urinate after 8-12 hours.  You develop a skin rash.  You have fever.  You have increasing redness at the site of your procedure. Get help right away if:  You have difficulty breathing.  You have chest pain.  You have unexpected bleeding.  You feel that you are having a life-threatening or urgent problem. This information is not intended to replace advice given to you by your health care provider. Make sure you discuss any questions you have with your health care provider. Document Released: 03/24/2001 Document Revised: 05/20/2016 Document Reviewed: 11/30/2015 Elsevier Interactive Patient Education  [employer].

## 2018-01-29 ENCOUNTER — Encounter (HOSPITAL_COMMUNITY)
Admission: RE | Admit: 2018-01-29 | Discharge: 2018-01-29 | Disposition: A | Discharge status: Home / Self Care | Payer: Medicare HMO | Source: Ambulatory Visit | Attending: Orthopedic Surgery | Admitting: Orthopedic Surgery

## 2018-01-29 ENCOUNTER — Encounter (HOSPITAL_COMMUNITY): Payer: Self-pay

## 2018-01-29 DIAGNOSIS — Z79899 Other long term (current) drug therapy: Secondary | ICD-10-CM | POA: Diagnosis not present

## 2018-01-29 DIAGNOSIS — E785 Hyperlipidemia, unspecified: Secondary | ICD-10-CM | POA: Insufficient documentation

## 2018-01-29 DIAGNOSIS — M23322 Other meniscus derangements, posterior horn of medial meniscus, left knee: Secondary | ICD-10-CM | POA: Insufficient documentation

## 2018-01-29 DIAGNOSIS — I119 Hypertensive heart disease without heart failure: Secondary | ICD-10-CM | POA: Insufficient documentation

## 2018-01-29 DIAGNOSIS — Z01818 Encounter for other preprocedural examination: Secondary | ICD-10-CM | POA: Diagnosis not present

## 2018-01-29 DIAGNOSIS — M1712 Unilateral primary osteoarthritis, left knee: Secondary | ICD-10-CM | POA: Diagnosis not present

## 2018-01-29 DIAGNOSIS — I252 Old myocardial infarction: Secondary | ICD-10-CM | POA: Insufficient documentation

## 2018-01-29 DIAGNOSIS — K219 Gastro-esophageal reflux disease without esophagitis: Secondary | ICD-10-CM | POA: Diagnosis not present

## 2018-01-29 DIAGNOSIS — Z7982 Long term (current) use of aspirin: Secondary | ICD-10-CM | POA: Diagnosis not present

## 2018-01-29 DIAGNOSIS — I2510 Atherosclerotic heart disease of native coronary artery without angina pectoris: Secondary | ICD-10-CM | POA: Diagnosis not present

## 2018-01-29 HISTORY — DX: Acute myocardial infarction, unspecified: I21.9

## 2018-01-29 LAB — CBC WITH DIFFERENTIAL/PLATELET
Basophils Absolute: 0 10*3/uL (ref 0.0–0.1)
Basophils Relative: 0 %
Eosinophils Absolute: 0.3 10*3/uL (ref 0.0–0.7)
Eosinophils Relative: 6 %
HCT: 41.3 % (ref 39.0–52.0)
Hemoglobin: 13.3 g/dL (ref 13.0–17.0)
Lymphocytes Relative: 34 %
Lymphs Abs: 1.6 10*3/uL (ref 0.7–4.0)
MCH: 29.8 pg (ref 26.0–34.0)
MCHC: 32.2 g/dL (ref 30.0–36.0)
MCV: 92.6 fL (ref 78.0–100.0)
Monocytes Absolute: 0.5 10*3/uL (ref 0.1–1.0)
Monocytes Relative: 11 %
Neutro Abs: 2.3 10*3/uL (ref 1.7–7.7)
Neutrophils Relative %: 49 %
Platelets: 173 10*3/uL (ref 150–400)
RBC: 4.46 MIL/uL (ref 4.22–5.81)
RDW: 12.6 % (ref 11.5–15.5)
WBC: 4.7 10*3/uL (ref 4.0–10.5)

## 2018-01-29 LAB — COMPREHENSIVE METABOLIC PANEL
ALT: 24 U/L (ref 17–63)
AST: 23 U/L (ref 15–41)
Albumin: 4.3 g/dL (ref 3.5–5.0)
Alkaline Phosphatase: 66 U/L (ref 38–126)
Anion gap: 8 (ref 5–15)
BUN: 16 mg/dL (ref 6–20)
CO2: 25 mmol/L (ref 22–32)
Calcium: 9.2 mg/dL (ref 8.9–10.3)
Chloride: 105 mmol/L (ref 101–111)
Creatinine, Ser: 0.99 mg/dL (ref 0.61–1.24)
GFR calc Af Amer: >60 mL/min (ref >60)
GFR calc non Af Amer: >60 mL/min (ref >60)
Glucose, Bld: 114 mg/dL — ABNORMAL HIGH (ref 65–99)
Potassium: 4.3 mmol/L (ref 3.5–5.1)
Sodium: 138 mmol/L (ref 135–145)
Total Bilirubin: 0.7 mg/dL (ref 0.3–1.2)
Total Protein: 6.9 g/dL (ref 6.5–8.1)

## 2018-01-29 LAB — PROTIME-INR
INR: 1
Prothrombin Time: 13.1 seconds (ref 11.4–15.2)

## 2018-02-04 NOTE — H&P (Signed)
Patient ID: Cory [name], male   DOB: Mar 08, 1946, 72 y.o.   MRN: [id number]   MRI FOLLOW UP   Chief Complaint  Patient presents with  . Knee Pain      left      HPI Cory [name] is a 72 y.o. male.     The patient has had MRI of the knee   Left knee pain especially with kneeling but it is in the back of the knee and its associated with a fullness and a feeling that there is cyst back there.  This pain is exacerbated also when the patient has had the knee sitting still for several minutes relieved once he gets going and walking there is no catching locking or giving way   He has a history of coronary artery disease status post stent placement   History of lumbar fusion     Past Surgical History:  Procedure Laterality Date  . CARDIAC CATHETERIZATION  05/17/2010   left main free of significant lesion; LAD with 60% lesion in mid segment & 60-70% lesion in mid-distal segment with subtotal occlusion at apex & small side branch of diagonal 1 that is subtotally occluded; ramus intermedius with 90% ostial stenosis; L Cfx w/mild luminal irreg; RCA is dominant vessel with diffuse disease, 50% prox lesion & 60% lesion in mid segment & 60% lesion in PDA  . CATARACT EXTRACTION W/PHACO Right 06/07/2015   Procedure: CATARACT EXTRACTION PHACO AND INTRAOCULAR LENS PLACEMENT (IOC);  Surgeon: Marylynn Pearson, MD;  Location: [facility];  Service: Ophthalmology;  Laterality: Right;  . COLONOSCOPY  02/25/2012   Procedure: COLONOSCOPY;  Surgeon: Jamesetta So, MD;  Location: [facility];  Service: Gastroenterology;  Laterality: N/A;  . CORONARY ANGIOPLASTY WITH STENT PLACEMENT  09/2001   Taxus (2.5x52mm) stent to 1st diagonal   . ESOPHAGEAL DILATION N/A 08/14/2016   Procedure: ESOPHAGEAL DILATION;  Surgeon: Rogene Houston, MD;  Location: [facility];  Service: Endoscopy;  Laterality: N/A;  . ESOPHAGOGASTRODUODENOSCOPY  02/25/2012   Procedure: ESOPHAGOGASTRODUODENOSCOPY (EGD);  Surgeon: Jamesetta So, MD;   Location: [facility];  Service: Gastroenterology;  Laterality: N/A;  . ESOPHAGOGASTRODUODENOSCOPY N/A 07/28/2014   Procedure: ESOPHAGOGASTRODUODENOSCOPY (EGD);  Surgeon: Rogene Houston, MD;  Location: [facility];  Service: Endoscopy;  Laterality: N/A;  200-rescheduled to [place] notified pt  . ESOPHAGOGASTRODUODENOSCOPY N/A 08/14/2016   Procedure: ESOPHAGOGASTRODUODENOSCOPY (EGD);  Surgeon: Rogene Houston, MD;  Location: [facility];  Service: Endoscopy;  Laterality: N/A;  100  . EYE SURGERY Bilateral    lasik surgery   . KNEE SURGERY    . LATERAL EPICONDYLE RELEASE Left 01/21/2014   Procedure: Left Tennis Elbow Release with debridement  tendon repair with reattachment;  Surgeon: Carole Civil, MD;  Location: [facility];  Service: Orthopedics;  Laterality: Left;  . penile implant    . PROSTATE SURGERY    . SHOULDER SURGERY    . TONSILLECTOMY    . TRANSTHORACIC ECHOCARDIOGRAM  03/2010   EF=>55%; LV mildly dilated; LA mod dilated; mild MR & TR; normal RVSP; mild pulm valve regurg      Review of Systems  Constitutional: Negative for chills, fever and weight loss.  Respiratory: Negative for shortness of breath.   Cardiovascular: Negative for chest pain.  Neurological: Negative for tingling.      Physical Exam BP (!) 148/75   Pulse 72   Ht 5\' 9"  (1.753 m)   Wt 181 lb (82.1 kg)  BMI 26.73 kg/m  Previous exams have revealed that he does have tenderness on the medial joint line although he has free range of motion no ligaments were torn on exam his strength is normal skin was intact pulses are good sensation was normal as well.  He was walking without support Data   No diagnosis found. 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 Oblique and free edge tear of the posterior horn medial meniscus primarily involving the inferior surface. 2. Grade  2 signal in the midbody lateral meniscus. 3. Expanded ACL with increased internal signal compatible with ACL cyst or prominent degeneration. 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 4. Mild to moderate degrees of degenerative chondral thinning. Small non-fragmented free osteochondral lesion at the lateral femoral trochlear groove. 5. Small Baker's cyst with adjacent semimembranosus-tibial collateral ligament bursitis. 6. Trace knee effusion.     Electronically Signed   By: Van Clines M.D.   On: 01/08/2018 15:13     The plan is to arthroscopic surgery left knee partial medial meniscectomy   The procedure has been fully reviewed with the patient; The risks and benefits of surgery have been discussed and explained and understood. Alternative treatment has also been reviewed, questions were encouraged and answered. The postoperative plan is also been reviewed.

## 2018-02-05 ENCOUNTER — Encounter (HOSPITAL_COMMUNITY): Payer: Self-pay

## 2018-02-05 ENCOUNTER — Ambulatory Visit (HOSPITAL_COMMUNITY): Payer: Medicare HMO | Admitting: Anesthesiology

## 2018-02-05 ENCOUNTER — Encounter (HOSPITAL_COMMUNITY)
Admission: RE | Disposition: A | Discharge status: Home / Self Care | Payer: Self-pay | Source: Ambulatory Visit | Attending: Orthopedic Surgery

## 2018-02-05 ENCOUNTER — Other Ambulatory Visit: Payer: Self-pay

## 2018-02-05 ENCOUNTER — Ambulatory Visit (HOSPITAL_COMMUNITY)
Admission: RE | Admit: 2018-02-05 | Discharge: 2018-02-05 | Disposition: A | Discharge status: Home / Self Care | Payer: Medicare HMO | Source: Ambulatory Visit | Attending: Orthopedic Surgery | Admitting: Orthopedic Surgery

## 2018-02-05 DIAGNOSIS — M23242 Derangement of anterior horn of lateral meniscus due to old tear or injury, left knee: Secondary | ICD-10-CM | POA: Diagnosis not present

## 2018-02-05 DIAGNOSIS — M23222 Derangement of posterior horn of medial meniscus due to old tear or injury, left knee: Secondary | ICD-10-CM | POA: Diagnosis not present

## 2018-02-05 DIAGNOSIS — M94262 Chondromalacia, left knee: Secondary | ICD-10-CM | POA: Insufficient documentation

## 2018-02-05 DIAGNOSIS — I252 Old myocardial infarction: Secondary | ICD-10-CM | POA: Diagnosis not present

## 2018-02-05 DIAGNOSIS — Z981 Arthrodesis status: Secondary | ICD-10-CM | POA: Diagnosis not present

## 2018-02-05 DIAGNOSIS — K219 Gastro-esophageal reflux disease without esophagitis: Secondary | ICD-10-CM | POA: Diagnosis not present

## 2018-02-05 DIAGNOSIS — I10 Essential (primary) hypertension: Secondary | ICD-10-CM | POA: Diagnosis not present

## 2018-02-05 DIAGNOSIS — Z955 Presence of coronary angioplasty implant and graft: Secondary | ICD-10-CM | POA: Diagnosis not present

## 2018-02-05 DIAGNOSIS — D649 Anemia, unspecified: Secondary | ICD-10-CM | POA: Diagnosis not present

## 2018-02-05 DIAGNOSIS — M25562 Pain in left knee: Secondary | ICD-10-CM | POA: Diagnosis not present

## 2018-02-05 DIAGNOSIS — F419 Anxiety disorder, unspecified: Secondary | ICD-10-CM | POA: Diagnosis not present

## 2018-02-05 DIAGNOSIS — Z9841 Cataract extraction status, right eye: Secondary | ICD-10-CM | POA: Insufficient documentation

## 2018-02-05 DIAGNOSIS — M1990 Unspecified osteoarthritis, unspecified site: Secondary | ICD-10-CM | POA: Insufficient documentation

## 2018-02-05 DIAGNOSIS — S83201A Bucket-handle tear of unspecified meniscus, current injury, left knee, initial encounter: Secondary | ICD-10-CM | POA: Diagnosis not present

## 2018-02-05 DIAGNOSIS — F4310 Post-traumatic stress disorder, unspecified: Secondary | ICD-10-CM | POA: Insufficient documentation

## 2018-02-05 DIAGNOSIS — S83242A Other tear of medial meniscus, current injury, left knee, initial encounter: Secondary | ICD-10-CM | POA: Diagnosis not present

## 2018-02-05 DIAGNOSIS — R69 Illness, unspecified: Secondary | ICD-10-CM | POA: Diagnosis not present

## 2018-02-05 DIAGNOSIS — I2510 Atherosclerotic heart disease of native coronary artery without angina pectoris: Secondary | ICD-10-CM | POA: Insufficient documentation

## 2018-02-05 HISTORY — PX: KNEE ARTHROSCOPY WITH MEDIAL MENISECTOMY: SHX5651

## 2018-02-05 SURGERY — ARTHROSCOPY, KNEE, WITH MEDIAL MENISCECTOMY
Anesthesia: General | Laterality: Left

## 2018-02-05 MED ORDER — MIDAZOLAM HCL 2 MG/2ML IJ SOLN
1.0000 mg | INTRAMUSCULAR | Status: AC
  Administered 2018-02-05: 2 mg via INTRAVENOUS

## 2018-02-05 MED ORDER — ACETAMINOPHEN 500 MG PO TABS
500.0000 mg | ORAL_TABLET | Freq: Once | ORAL | Status: AC
Start: 2018-02-05 — End: 2018-02-05
  Administered 2018-02-05: 500 mg via ORAL
  Filled 2018-02-05: qty 1

## 2018-02-05 MED ORDER — BUPIVACAINE-EPINEPHRINE (PF) 0.5% -1:200000 IJ SOLN
INTRAMUSCULAR | Status: AC
  Filled 2018-02-05: qty 60

## 2018-02-05 MED ORDER — BUPIVACAINE-EPINEPHRINE (PF) 0.5% -1:200000 IJ SOLN
INTRAMUSCULAR | Status: DC | PRN
  Administered 2018-02-05: 50 mL
  Administered 2018-02-05: 10 mL

## 2018-02-05 MED ORDER — EPINEPHRINE PF 1 MG/ML IJ SOLN
INTRAMUSCULAR | Status: AC
  Filled 2018-02-05: qty 5

## 2018-02-05 MED ORDER — CHLORHEXIDINE GLUCONATE 4 % EX LIQD: 60.0000 mL | Freq: Once | CUTANEOUS | Status: DC

## 2018-02-05 MED ORDER — MIDAZOLAM HCL 2 MG/2ML IJ SOLN
INTRAMUSCULAR | Status: AC
  Filled 2018-02-05: qty 2

## 2018-02-05 MED ORDER — ONDANSETRON HCL 4 MG/2ML IJ SOLN
4.0000 mg | Freq: Once | INTRAMUSCULAR | Status: AC
Start: 2018-02-05 — End: 2018-02-05
  Administered 2018-02-05: 4 mg via INTRAVENOUS
  Filled 2018-02-05: qty 2

## 2018-02-05 MED ORDER — LACTATED RINGERS IV SOLN
INTRAVENOUS | Status: DC
  Administered 2018-02-05: 09:00:00 via INTRAVENOUS

## 2018-02-05 MED ORDER — EPINEPHRINE PF 1 MG/ML IJ SOLN
INTRAMUSCULAR | Status: DC | PRN
  Administered 2018-02-05 (×3): 3000 mL

## 2018-02-05 MED ORDER — PROPOFOL 10 MG/ML IV BOLUS
INTRAVENOUS | Status: DC | PRN
  Administered 2018-02-05: 170 mg via INTRAVENOUS

## 2018-02-05 MED ORDER — ONDANSETRON HCL 4 MG/2ML IJ SOLN
4.0000 mg | Freq: Once | INTRAMUSCULAR | Status: AC
  Administered 2018-02-05: 4 mg via INTRAVENOUS

## 2018-02-05 MED ORDER — ONDANSETRON HCL 4 MG/2ML IJ SOLN
INTRAMUSCULAR | Status: AC
  Filled 2018-02-05: qty 2

## 2018-02-05 MED ORDER — LIDOCAINE HCL (PF) 1 % IJ SOLN
INTRAMUSCULAR | Status: AC
  Filled 2018-02-05: qty 5

## 2018-02-05 MED ORDER — GLYCOPYRROLATE 0.2 MG/ML IJ SOLN
INTRAMUSCULAR | Status: AC
  Filled 2018-02-05: qty 1

## 2018-02-05 MED ORDER — FENTANYL CITRATE (PF) 100 MCG/2ML IJ SOLN: 25.0000 ug | INTRAMUSCULAR | Status: DC | PRN

## 2018-02-05 MED ORDER — FENTANYL CITRATE (PF) 100 MCG/2ML IJ SOLN
INTRAMUSCULAR | Status: AC
  Filled 2018-02-05: qty 2

## 2018-02-05 MED ORDER — FENTANYL CITRATE (PF) 100 MCG/2ML IJ SOLN
INTRAMUSCULAR | Status: DC | PRN
  Administered 2018-02-05 (×2): 50 ug via INTRAVENOUS

## 2018-02-05 MED ORDER — CEFAZOLIN SODIUM-DEXTROSE 2-4 GM/100ML-% IV SOLN
2.0000 g | INTRAVENOUS | Status: AC
  Administered 2018-02-05: 2 g via INTRAVENOUS
  Filled 2018-02-05: qty 100

## 2018-02-05 MED ORDER — SODIUM CHLORIDE 0.9 % IR SOLN
Status: DC | PRN
  Administered 2018-02-05: 1000 mL

## 2018-02-05 MED ORDER — HYDROCODONE-ACETAMINOPHEN 5-325 MG PO TABS: 1.0000 | ORAL_TABLET | ORAL | 0 refills | Status: DC | PRN

## 2018-02-05 MED ORDER — HYDROCODONE-ACETAMINOPHEN 5-325 MG PO TABS
1.0000 | ORAL_TABLET | Freq: Once | ORAL | Status: AC
  Administered 2018-02-05: 1 via ORAL
  Filled 2018-02-05: qty 1

## 2018-02-05 MED ORDER — LIDOCAINE HCL 2 % EX GEL
CUTANEOUS | Status: DC | PRN
  Administered 2018-02-05: 50 via TOPICAL

## 2018-02-05 MED ORDER — PROPOFOL 10 MG/ML IV BOLUS
INTRAVENOUS | Status: AC
  Filled 2018-02-05: qty 20

## 2018-02-05 SURGICAL SUPPLY — 45 items
BAG HAMPER (MISCELLANEOUS) ×2 IMPLANT
BANDAGE ELASTIC 6 LF NS (GAUZE/BANDAGES/DRESSINGS) ×2 IMPLANT
BLADE AGGRESSIVE PLUS 4.0 (BLADE) ×2 IMPLANT
BLADE SURG SZ11 CARB STEEL (BLADE) ×2 IMPLANT
CHLORAPREP W/TINT 26ML (MISCELLANEOUS) ×2 IMPLANT
CLOTH BEACON ORANGE TIMEOUT ST (SAFETY) ×2 IMPLANT
COOLER CRYO IC GRAV AND TUBE (ORTHOPEDIC SUPPLIES) ×2 IMPLANT
CUFF CRYO KNEE18X23 MED (MISCELLANEOUS) ×2 IMPLANT
CUFF TOURNIQUET SINGLE 34IN LL (TOURNIQUET CUFF) ×2 IMPLANT
DECANTER SPIKE VIAL GLASS SM (MISCELLANEOUS) ×4 IMPLANT
GAUZE SPONGE 4X4 12PLY STRL (GAUZE/BANDAGES/DRESSINGS) ×2 IMPLANT
GAUZE SPONGE 4X4 16PLY XRAY LF (GAUZE/BANDAGES/DRESSINGS) ×2 IMPLANT
GAUZE XEROFORM 5X9 LF (GAUZE/BANDAGES/DRESSINGS) ×2 IMPLANT
GLOVE BIOGEL PI IND STRL 7.0 (GLOVE) ×2 IMPLANT
GLOVE BIOGEL PI INDICATOR 7.0 (GLOVE) ×2
GLOVE ECLIPSE 6.5 STRL STRAW (GLOVE) ×2 IMPLANT
GLOVE SKINSENSE NS SZ8.0 LF (GLOVE) ×1
GLOVE SKINSENSE STRL SZ8.0 LF (GLOVE) ×1 IMPLANT
GLOVE SS N UNI LF 8.5 STRL (GLOVE) ×2 IMPLANT
GOWN STRL REUS W/ TWL LRG LVL3 (GOWN DISPOSABLE) ×1 IMPLANT
GOWN STRL REUS W/TWL LRG LVL3 (GOWN DISPOSABLE) ×2
GOWN STRL REUS W/TWL XL LVL3 (GOWN DISPOSABLE) ×2 IMPLANT
HLDR LEG FOAM (MISCELLANEOUS) ×1 IMPLANT
IV NS IRRIG 3000ML ARTHROMATIC (IV SOLUTION) ×6 IMPLANT
KIT BLADEGUARD II DBL (SET/KITS/TRAYS/PACK) ×2 IMPLANT
KIT ROOM TURNOVER AP CYSTO (KITS) ×2 IMPLANT
LEG HOLDER FOAM (MISCELLANEOUS) ×2
MANIFOLD NEPTUNE II (INSTRUMENTS) ×2 IMPLANT
MARKER SKIN DUAL TIP RULER LAB (MISCELLANEOUS) ×2 IMPLANT
NEEDLE HYPO 18GX1.5 BLUNT FILL (NEEDLE) ×2 IMPLANT
NEEDLE HYPO 21X1.5 SAFETY (NEEDLE) ×2 IMPLANT
NEEDLE SPNL 18GX3.5 QUINCKE PK (NEEDLE) ×2 IMPLANT
NS IRRIG 1000ML POUR BTL (IV SOLUTION) ×2 IMPLANT
PACK ARTHRO LIMB DRAPE STRL (MISCELLANEOUS) ×2 IMPLANT
PAD ABD 5X9 TENDERSORB (GAUZE/BANDAGES/DRESSINGS) ×2 IMPLANT
PAD ARMBOARD 7.5X6 YLW CONV (MISCELLANEOUS) ×2 IMPLANT
PADDING CAST COTTON 6X4 STRL (CAST SUPPLIES) ×2 IMPLANT
PROBE BIPOLAR 50 DEGREE SUCT (MISCELLANEOUS) ×2 IMPLANT
SET ARTHROSCOPY INST (INSTRUMENTS) ×2 IMPLANT
SET BASIN LINEN APH (SET/KITS/TRAYS/PACK) ×2 IMPLANT
SUT ETHILON 3 0 FSL (SUTURE) ×2 IMPLANT
SYR 30ML LL (SYRINGE) ×2 IMPLANT
SYRINGE 10CC LL (SYRINGE) ×2 IMPLANT
TUBE CONNECTING 12X1/4 (SUCTIONS) ×4 IMPLANT
TUBING ARTHRO INFLOW-ONLY STRL (TUBING) ×2 IMPLANT

## 2018-02-05 NOTE — Anesthesia Procedure Notes (Signed)
Procedure Name: LMA Insertion Performed by: Sandrea Matte, CRNA Pre-anesthesia Checklist: Patient identified, Emergency Drugs available, Suction available and Patient being monitored Patient Re-evaluated:Patient Re-evaluated prior to induction Oxygen Delivery Method: Circle system utilized Preoxygenation: Pre-oxygenation with 100% oxygen Induction Type: IV induction LMA: LMA inserted LMA Size: 4.0 Number of attempts: 1

## 2018-02-05 NOTE — Transfer of Care (Signed)
Immediate Anesthesia Transfer of Care Note  Patient: Cory [name]  Procedure(s) Performed: LEFT KNEE ARTHROSCOPY WITH PARTIAL MEDIAL MENISECTOMY (Left )  Patient Location: PACU  Anesthesia Type:General  Level of Consciousness: awake, alert , oriented and patient cooperative  Airway & Oxygen Therapy: Patient Spontanous Breathing and Patient connected to nasal cannula oxygen  Post-op Assessment: Report given to RN, Post -op Vital signs reviewed and stable and Patient moving all extremities X 4  Post vital signs: Reviewed and stable  Last Vitals:  Vitals:   02/05/18 0930 02/05/18 0935  BP: 138/84   Pulse:    Resp: 20 15  Temp:    SpO2: 93% 94%    Last Pain:  Vitals:   02/05/18 0844  TempSrc: Oral  PainSc: 4       Patients Stated Pain Goal: 4 (35/46/56 8127)  Complications: No apparent anesthesia complications

## 2018-02-05 NOTE — Anesthesia Preprocedure Evaluation (Addendum)
Anesthesia Evaluation  Patient identified by MRN, date of birth, ID band Patient awake    Reviewed: Allergy & Precautions, H&P , NPO status , Patient's Chart, lab work & pertinent test results  Airway Mallampati: II  TM Distance: >3 FB Neck ROM: Full    Dental no notable dental hx. (+) Teeth Intact, Dental Advisory Given   Pulmonary neg pulmonary ROS,    Pulmonary exam normal breath sounds clear to auscultation       Cardiovascular hypertension, Pt. on medications (-) angina+ CAD, + Past MI and + Cardiac Stents   Rhythm:Regular Rate:Normal     Neuro/Psych PSYCHIATRIC DISORDERS ( PTSD) Anxiety negative neurological ROS     GI/Hepatic Neg liver ROS, GERD  Controlled and Medicated,  Endo/Other  negative endocrine ROS  Renal/GU negative Renal ROS  negative genitourinary   Musculoskeletal  (+) Arthritis , Osteoarthritis,    Abdominal   Peds  Hematology  (+) anemia ,   Anesthesia Other Findings   Reproductive/Obstetrics negative OB ROS                            Anesthesia Physical Anesthesia Plan  ASA: III  Anesthesia Plan: General   Post-op Pain Management:    Induction: Intravenous  PONV Risk Score and Plan:   Airway Management Planned: LMA  Additional Equipment:   Intra-op Plan:   Post-operative Plan: Extubation in OR  Informed Consent: I have reviewed the patients History and Physical, chart, labs and discussed the procedure including the risks, benefits and alternatives for the proposed anesthesia with the patient or authorized representative who has indicated his/her understanding and acceptance.     Plan Discussed with:   Anesthesia Plan Comments:       Anesthesia Quick Evaluation

## 2018-02-05 NOTE — Anesthesia Postprocedure Evaluation (Signed)
Anesthesia Post Note  Patient: Cory [name]  Procedure(s) Performed: LEFT KNEE ARTHROSCOPY WITH PARTIAL MEDIAL MENISECTOMY (Left )  Patient location during evaluation: PACU Anesthesia Type: General Level of consciousness: awake and alert and oriented Pain management: pain level controlled Vital Signs Assessment: post-procedure vital signs reviewed and stable Respiratory status: spontaneous breathing Cardiovascular status: stable Postop Assessment: no apparent nausea or vomiting and adequate PO intake Anesthetic complications: no Comments: Late entry     Last Vitals:  Vitals:   02/05/18 1115 02/05/18 1128  BP: 130/73 135/73  Pulse: 66 66  Resp: 15   Temp:  36.6 C  SpO2: 94% 99%    Last Pain:  Vitals:   02/05/18 1128  TempSrc: Oral  PainSc:                  Tressie Stalker

## 2018-02-05 NOTE — Discharge Instructions (Signed)
DME walker for outpatient postoperative use

## 2018-02-05 NOTE — Brief Op Note (Signed)
02/05/2018  10:42 AM  PATIENT:  Cory [name]  72 y.o. male  PRE-OPERATIVE DIAGNOSIS:  left knee pain; torn medial meniscus  POST-OPERATIVE DIAGNOSIS:  left knee pain; torn medial meniscus  PROCEDURE:  Procedure(s): LEFT KNEE ARTHROSCOPY WITH PARTIAL MEDIAL MENISECTOMY (Left)   Surgical findings were torn medial meniscus posterior horn mild chondromalacia throughout the knee free edge tearing of the lateral meniscus anterior horn and significant  SURGEON:  Surgeon(s) and Role:    Carole Civil, MD - Primary  PHYSICIAN ASSISTANT:   ASSISTANTS: none   ANESTHESIA:   general  EBL:  0 mL   BLOOD ADMINISTERED:none  DRAINS: none   LOCAL MEDICATIONS USED:  MARCAINE     SPECIMEN:  No Specimen  DISPOSITION OF SPECIMEN:  N/A  COUNTS:  YES  TOURNIQUET:  * Missing tourniquet times found for documented tourniquets in log: [id number] *  DICTATION: .Dragon Dictation  PLAN OF CARE: Discharge to home after PACU  PATIENT DISPOSITION:  PACU - hemodynamically stable.   Delay start of Pharmacological VTE agent (>24hrs) due to surgical blood loss or risk of bleeding: not applicable  [id number]  Knee arthroscopy dictation  The patient was identified in the preoperative holding area using 2 approved identification mechanisms. The chart was reviewed and updated. The surgical site was confirmed as left knee and marked with an indelible marker.  The patient was taken to the operating room for anesthesia. After successful general anesthesia, Ancef 2 g was used as IV antibiotics.  The patient was placed in the supine position with the (left) the operative extremity in an arthroscopic leg holder and the opposite extremity in a padded leg holder.  The timeout was executed.  A lateral portal was established with an 11 blade and the scope was introduced into the joint. A diagnostic arthroscopy was performed in circumferential manner examining the entire knee joint. A medial portal was  established and the diagnostic arthroscopy was repeated using a probe to palpate intra-articular structures as they were encountered.   Primary findings torn medial meniscus left knee  The medial meniscus was resected using a duckbill forceps. The meniscal fragments were removed with a motorized shaver. The meniscus was balanced with a combination of a motorized shaver and a an arthroscopic shaver was used to remove the ArthroCare wand until a stable rim was obtained.  Free edge tear of the lateral meniscus which was very small and significant  The arthroscopic pump was placed on the wash mode and any excess debris was removed from the joint using suction.  60 cc of Marcaine with epinephrine was injected through the arthroscope.  The portals were closed with 3-0 nylon suture.  A sterile bandage, Ace wrap and Cryo/Cuff was placed and the Cryo/Cuff was activated. The patient was taken to the recovery room in stable condition.

## 2018-02-05 NOTE — Interval H&P Note (Signed)
History and Physical Interval Note:  02/05/2018 9:37 AM  Cory [name]  has presented today for surgery, with the diagnosis of left knee arthroscopy with medial menisectomy  The various methods of treatment have been discussed with the patient and family. After consideration of risks, benefits and other options for treatment, the patient has consented to  Procedure(s): KNEE ARTHROSCOPY WITH MEDIAL MENISECTOMY (Left) as a surgical intervention .  The patient's history has been reviewed, patient examined, no change in status, stable for surgery.  I have reviewed the patient's chart and labs.  Questions were answered to the patient's satisfaction.     [name]

## 2018-02-05 NOTE — Op Note (Signed)
02/05/2018  10:42 AM  PATIENT:  Cory [name]  71 y.o. male  PRE-OPERATIVE DIAGNOSIS:  left knee pain; torn medial meniscus  POST-OPERATIVE DIAGNOSIS:  left knee pain; torn medial meniscus  PROCEDURE:  Procedure(s): LEFT KNEE ARTHROSCOPY WITH PARTIAL MEDIAL MENISECTOMY (Left)   Surgical findings were torn medial meniscus posterior horn mild chondromalacia throughout the knee free edge tearing of the lateral meniscus anterior horn and significant  SURGEON:  Surgeon(s) and Role:    * Deseree Zemaitis E, MD - Primary  PHYSICIAN ASSISTANT:   ASSISTANTS: none   ANESTHESIA:   general  EBL:  0 mL   BLOOD ADMINISTERED:none  DRAINS: none   LOCAL MEDICATIONS USED:  MARCAINE     SPECIMEN:  No Specimen  DISPOSITION OF SPECIMEN:  N/A  COUNTS:  YES  TOURNIQUET:  * Missing tourniquet times found for documented tourniquets in log: [id number] *  DICTATION: .Dragon Dictation  PLAN OF CARE: Discharge to home after PACU  PATIENT DISPOSITION:  PACU - hemodynamically stable.   Delay start of Pharmacological VTE agent (>24hrs) due to surgical blood loss or risk of bleeding: not applicable  29881  Knee arthroscopy dictation  The patient was identified in the preoperative holding area using 2 approved identification mechanisms. The chart was reviewed and updated. The surgical site was confirmed as left knee and marked with an indelible marker.  The patient was taken to the operating room for anesthesia. After successful general anesthesia, Ancef 2 g was used as IV antibiotics.  The patient was placed in the supine position with the (left) the operative extremity in an arthroscopic leg holder and the opposite extremity in a padded leg holder.  The timeout was executed.  A lateral portal was established with an 11 blade and the scope was introduced into the joint. A diagnostic arthroscopy was performed in circumferential manner examining the entire knee joint. A medial portal was  established and the diagnostic arthroscopy was repeated using a probe to palpate intra-articular structures as they were encountered.   Primary findings torn medial meniscus left knee  The medial meniscus was resected using a duckbill forceps. The meniscal fragments were removed with a motorized shaver. The meniscus was balanced with a combination of a motorized shaver and a an arthroscopic shaver was used to remove the ArthroCare wand until a stable rim was obtained.  Free edge tear of the lateral meniscus which was very small and significant  The arthroscopic pump was placed on the wash mode and any excess debris was removed from the joint using suction.  60 cc of Marcaine with epinephrine was injected through the arthroscope.  The portals were closed with 3-0 nylon suture.  A sterile bandage, Ace wrap and Cryo/Cuff was placed and the Cryo/Cuff was activated. The patient was taken to the recovery room in stable condition.   

## 2018-02-06 ENCOUNTER — Encounter (HOSPITAL_COMMUNITY): Payer: Self-pay | Admitting: Orthopedic Surgery

## 2018-02-12 DIAGNOSIS — Z9889 Other specified postprocedural states: Secondary | ICD-10-CM | POA: Insufficient documentation

## 2018-02-16 ENCOUNTER — Ambulatory Visit (INDEPENDENT_AMBULATORY_CARE_PROVIDER_SITE_OTHER): Payer: Medicare HMO | Admitting: Orthopedic Surgery

## 2018-02-16 ENCOUNTER — Encounter: Payer: Self-pay | Admitting: Orthopedic Surgery

## 2018-02-16 DIAGNOSIS — Z9889 Other specified postprocedural states: Secondary | ICD-10-CM

## 2018-02-16 NOTE — Progress Notes (Signed)
POST OP VISIT   Chief Complaint  Patient presents with  . Knee Pain    left    Encounter Diagnosis  Name Primary?  . S/P left knee arthroscopy 02/05/18      Current Outpatient Medications:  .  acetaminophen (TYLENOL) 325 MG tablet, Take 650 mg by mouth daily as needed for moderate pain or headache., Disp: , Rfl:  .  aspirin 81 MG tablet, Take 81 mg by mouth every evening. , Disp: , Rfl:  .  atorvastatin (LIPITOR) 80 MG tablet, Take 1 tablet (80 mg total) by mouth daily. (Patient taking differently: Take 80 mg by mouth every evening. ), Disp: 90 tablet, Rfl: 3 .  buPROPion (WELLBUTRIN SR) 150 MG 12 hr tablet, Take 150 mg by mouth daily., Disp: , Rfl:  .  fluticasone (FLONASE) 50 MCG/ACT nasal spray, Place 1 spray into both nostrils daily as needed for allergies or rhinitis., Disp: , Rfl:  .  gabapentin (NEURONTIN) 300 MG capsule, Take 300 mg by mouth 2 (two) times daily., Disp: , Rfl:  .  HYDROcodone-acetaminophen (NORCO/VICODIN) 5-325 MG tablet, Take 1 tablet by mouth every 4 (four) hours as needed for moderate pain., Disp: 30 tablet, Rfl: 0 .  irbesartan (AVAPRO) 300 MG tablet, Take 1 tablet (300 mg total) daily by mouth., Disp: 90 tablet, Rfl: 3 .  loratadine (CLARITIN) 10 MG tablet, Take 10 mg by mouth daily as needed for allergies., Disp: , Rfl:  .  Magnesium Malate 1250 (141.7 Mg) MG TABS, Take 1,250 mg by mouth daily., Disp: , Rfl:  .  pantoprazole (PROTONIX) 40 MG tablet, Take 1 tablet (40 mg total) by mouth 2 (two) times daily before a meal., Disp: 180 tablet, Rfl: 1 .  traZODone (DESYREL) 100 MG tablet, Take 100 mg by mouth at bedtime. , Disp: , Rfl:  .  venlafaxine (EFFEXOR) 75 MG tablet, Take 75 mg by mouth daily., Disp: , Rfl:   THERE ARE NO COMPLAINTS   THE WOUND LOOKS CLEAN AND THERE ARE NO SIGNS OF ERYTHEMA  NEUROVASCULAR EXAM IS NORMAL   EVERYTHING LOOKS GOOD   Sutures were removed, flexion is 100 degrees extension is 10 degrees  FU WILL BE SCHEDULED, home  exercise program with PEP pad  Follow-up 3 weeks

## 2018-02-16 NOTE — Patient Instructions (Signed)
Start home exercises

## 2018-02-18 ENCOUNTER — Ambulatory Visit: Payer: Medicare HMO | Admitting: Orthopedic Surgery

## 2018-02-23 ENCOUNTER — Telehealth: Payer: Self-pay | Admitting: Orthopedic Surgery

## 2018-02-23 NOTE — Telephone Encounter (Signed)
Patient came by stating he is hurting more than before and the baker's cyst is back. He is in a lot of pain. Please advise.

## 2018-02-24 DIAGNOSIS — E663 Overweight: Secondary | ICD-10-CM | POA: Diagnosis not present

## 2018-02-24 DIAGNOSIS — Z6825 Body mass index (BMI) 25.0-25.9, adult: Secondary | ICD-10-CM | POA: Diagnosis not present

## 2018-02-24 DIAGNOSIS — Z1389 Encounter for screening for other disorder: Secondary | ICD-10-CM | POA: Diagnosis not present

## 2018-02-24 DIAGNOSIS — J069 Acute upper respiratory infection, unspecified: Secondary | ICD-10-CM | POA: Diagnosis not present

## 2018-02-24 NOTE — Telephone Encounter (Signed)
Put ice on his knee and take pain medicine prescribed

## 2018-02-24 NOTE — Telephone Encounter (Signed)
Relayed message to patient

## 2018-03-11 ENCOUNTER — Ambulatory Visit (INDEPENDENT_AMBULATORY_CARE_PROVIDER_SITE_OTHER): Payer: Medicare HMO | Admitting: Orthopedic Surgery

## 2018-03-11 ENCOUNTER — Encounter: Payer: Self-pay | Admitting: Orthopedic Surgery

## 2018-03-11 VITALS — BP 130/84 | HR 66 | Ht 69.0 in | Wt 173.0 lb

## 2018-03-11 DIAGNOSIS — Z9889 Other specified postprocedural states: Secondary | ICD-10-CM

## 2018-03-11 DIAGNOSIS — M1712 Unilateral primary osteoarthritis, left knee: Secondary | ICD-10-CM

## 2018-03-11 DIAGNOSIS — M25562 Pain in left knee: Secondary | ICD-10-CM

## 2018-03-11 DIAGNOSIS — M23322 Other meniscus derangements, posterior horn of medial meniscus, left knee: Secondary | ICD-10-CM

## 2018-03-11 DIAGNOSIS — Z981 Arthrodesis status: Secondary | ICD-10-CM

## 2018-03-11 MED ORDER — MELOXICAM 7.5 MG PO TABS: 7.5000 mg | ORAL_TABLET | Freq: Every day | ORAL | 0 refills | Status: DC

## 2018-03-11 NOTE — Progress Notes (Signed)
POST OP VISIT   Patient ID: Cory [name], male   DOB: Jan 30, 1946, 72 y.o.   MRN: [id number]  Chief Complaint  Patient presents with  . Post-op Follow-up    02/05/18 left knee arthroscopy     Encounter Diagnosis  Name Primary?  . S/P left knee arthroscopy 02/05/18 Yes   C/O medial knee pain and swelling behind the left knee.  He says is getting better every day he would like to use a stationary bike at the [facility]  His exam today shows some tenderness over the medial femoral condyle which is mild he has not quite full extension is 120 degrees of flexion some fullness in the back of the knee no joint effusion palpable anteriorly    02/05/2018  10:42 AM  PATIENT:  Cory [name]  72 y.o. male  PRE-OPERATIVE DIAGNOSIS:  left knee pain; torn medial meniscus  POST-OPERATIVE DIAGNOSIS:  left knee pain; torn medial meniscus  PROCEDURE:  Procedure(s): LEFT KNEE ARTHROSCOPY WITH PARTIAL MEDIAL MENISECTOMY (Left)   Surgical findings were torn medial meniscus posterior horn mild chondromalacia throughout the knee free edge tearing of the lateral meniscus anterior horn and significant    Start meloxicam  Return in 6 weeks okay to start by cycle at the [facility]   Encounter Diagnoses  Name Primary?  . S/P left knee arthroscopy 02/05/18 Yes  . Arthritis of left knee   . Derangement of posterior horn of medial meniscus of left knee   . Posterior left knee pain   . History of lumbar spinal fusion

## 2018-04-28 ENCOUNTER — Ambulatory Visit (INDEPENDENT_AMBULATORY_CARE_PROVIDER_SITE_OTHER): Payer: Medicare HMO | Admitting: Orthopedic Surgery

## 2018-04-28 ENCOUNTER — Encounter: Payer: Self-pay | Admitting: Orthopedic Surgery

## 2018-04-28 VITALS — Ht 69.0 in | Wt 176.0 lb

## 2018-04-28 DIAGNOSIS — Z9889 Other specified postprocedural states: Secondary | ICD-10-CM | POA: Diagnosis not present

## 2018-04-28 DIAGNOSIS — M7122 Synovial cyst of popliteal space [Baker], left knee: Secondary | ICD-10-CM

## 2018-04-28 MED ORDER — MELOXICAM 7.5 MG PO TABS: 7.5000 mg | ORAL_TABLET | Freq: Every day | ORAL | 0 refills | Status: DC

## 2018-04-28 NOTE — Progress Notes (Signed)
POSTOP VISIT  POD #11 weeks  Ht 5\' 9"  (1.753 m)   Wt 176 lb (79.8 kg)   BMI 25.99 kg/m   Encounter Diagnoses  Name Primary?  . S/P left knee arthroscopy 02/05/18 Yes  . Cyst, baker's knee, left     Patient complains of pain posterior aspect left knee difficulty bending his knee difficulty getting in and out of his car pain is mainly with flexion and at all posterior with no pain over the medial joint line  Examination left knee no tenderness over the medial joint line is active flexion is 100 degrees as painful extension -5 degrees knee is not warm tenderness in the popliteal fossa   or erythematous to touch   Aspiration attempted a popliteal cyst.  No fluid obtained. Procedure note left knee popliteal cyst aspiration.  Patient gave verbal consent.  Site confirmation performed.  18-gauge needle placed in the popliteal fossa carefully.  No gelatinous or synovial fluid obtained  Recommend ultrasound guided aspiration  Recommend continue meloxicam  Order ultrasound-guided  aspiration left knee  Patient will continue meloxicam and see me after the aspiration is attempted

## 2018-04-29 ENCOUNTER — Other Ambulatory Visit: Payer: Self-pay | Admitting: Orthopedic Surgery

## 2018-05-07 ENCOUNTER — Ambulatory Visit
Admission: RE | Admit: 2018-05-07 | Discharge: 2018-05-07 | Disposition: A | Discharge status: Home / Self Care | Payer: Medicare HMO | Source: Ambulatory Visit | Attending: Orthopedic Surgery | Admitting: Orthopedic Surgery

## 2018-05-07 DIAGNOSIS — M7122 Synovial cyst of popliteal space [Baker], left knee: Secondary | ICD-10-CM | POA: Diagnosis not present

## 2018-05-18 ENCOUNTER — Ambulatory Visit (HOSPITAL_COMMUNITY): Payer: Medicare HMO | Attending: Cardiology

## 2018-05-18 ENCOUNTER — Other Ambulatory Visit: Payer: Self-pay | Admitting: Internal Medicine

## 2018-05-18 DIAGNOSIS — E785 Hyperlipidemia, unspecified: Secondary | ICD-10-CM | POA: Diagnosis not present

## 2018-05-18 DIAGNOSIS — I255 Ischemic cardiomyopathy: Secondary | ICD-10-CM | POA: Diagnosis not present

## 2018-05-18 DIAGNOSIS — I119 Hypertensive heart disease without heart failure: Secondary | ICD-10-CM | POA: Insufficient documentation

## 2018-05-18 DIAGNOSIS — I2510 Atherosclerotic heart disease of native coronary artery without angina pectoris: Secondary | ICD-10-CM | POA: Diagnosis not present

## 2018-05-20 ENCOUNTER — Other Ambulatory Visit: Payer: Self-pay

## 2018-05-20 DIAGNOSIS — E785 Hyperlipidemia, unspecified: Secondary | ICD-10-CM

## 2018-05-21 ENCOUNTER — Encounter: Payer: Self-pay | Admitting: Internal Medicine

## 2018-05-21 ENCOUNTER — Ambulatory Visit (INDEPENDENT_AMBULATORY_CARE_PROVIDER_SITE_OTHER): Payer: Medicare HMO | Admitting: Internal Medicine

## 2018-05-21 VITALS — BP 114/72 | HR 70 | Ht 69.0 in | Wt 173.8 lb

## 2018-05-21 DIAGNOSIS — I2510 Atherosclerotic heart disease of native coronary artery without angina pectoris: Secondary | ICD-10-CM | POA: Diagnosis not present

## 2018-05-21 DIAGNOSIS — I255 Ischemic cardiomyopathy: Secondary | ICD-10-CM | POA: Diagnosis not present

## 2018-05-21 DIAGNOSIS — E782 Mixed hyperlipidemia: Secondary | ICD-10-CM

## 2018-05-21 DIAGNOSIS — E785 Hyperlipidemia, unspecified: Secondary | ICD-10-CM | POA: Diagnosis not present

## 2018-05-21 MED ORDER — CARVEDILOL 3.125 MG PO TABS: 3.1250 mg | ORAL_TABLET | Freq: Two times a day (BID) | ORAL | 3 refills | Status: DC

## 2018-05-21 MED ORDER — CARVEDILOL 3.125 MG PO TABS: 3.1250 mg | ORAL_TABLET | Freq: Two times a day (BID) | ORAL | 3 refills | Status: AC

## 2018-05-21 NOTE — Progress Notes (Signed)
OFFICE NOTE  Chief Complaint:  Routine follow-up  Primary Care Physician: Sharilyn Sites, MD  HPI:  Cory [name] is a 72 year old gentleman, who I have been following for coronary artery disease, status post Taxus stenting in 2002. He also has had some ongoing chronic angina controlled on Imdur 60 mg. He also has PTSD, on a number of medications, but that has been stable, as well as hypertension. Today he has a few different complaints, including some increasing urinary frequency. He does have a history of BPH and is followed at the [place] for this. Sounds like he may have had a TURP procedure recently. He is not on any medications for his prostate. In addition he is describing fatigue. This is a new symptom for him and reports he is sleeping much more regularly and often than he did in the past. He's not exercising as much as he had been before. Of note he is on 3 different antidepressant medications for his PTSD, again managed by the [facility].  He reports fairly stable exertional angina however at times he does get more short of breath with exercise. His last stress test was in 2011.  Mr. Cory [name] returns today for follow-up. He is without complaint. His blood pressure appears to be well-controlled. He denies any chest pain and is actually had very little need to take any short acting nitroglycerin since he's been on Imdur. He remains active and has no shortness of breath.  01/08/2017  Mr. Cory [name] returns today for follow-up. He was seen in October by Murray Hodgkins, NP, for follow-up of an ST elevation MI. He was vacationing in the [facility] area and presented to [facility] hospital after having ST elevation. He underwent emergent Catheterization and was found to have subtotal occlusion of the right coronary artery. He received 4 drug-eluting stents and was discharged on Effient, aspirin, atenolol and simvastatin. Subsequent to that he is done fairly well without any recurrent chest  pain. He does get a little mild dyspnea with exertion. He did not participate in cardiac rehabilitation. He was noted to be markedly bradycardic and follow-up and his atenolol was discontinued. His simvastatin was discontinued in favor of a highly active statin, namely Lipitor 80 mg daily. A repeat lipid profile was performed on 12/04/2016 showing total cluster 105, triglycerides 63, HDL-C 49, and LDL-C 43. Is is excellent control and I would continue with his current dose. Also of note, his blood pressure is elevated today. He says that it has remained elevated since discontinuing his atenolol.  01/24/2017  Mr. Cory [name] returns for follow-up of his echo. Recently that demonstrated an EF of 45% which is reduced from his prior studies. This is related to his recent STEMI. Hopefully he'll have some recovery of his LV function in time. I did switch him from losartan 100 up to valsartan 320 since his blood pressure was not at goal. Now his blood pressure is much improved at 130/82. He feels really well other than he has some pain behind his left knee. He reports to stiffness and difficulty in extending it. He does not recall injuring it. He wondered if this is related to his cholesterol medicine. Of note he is been on a statin medication now for several months.  08/07/2017  Mr. Cory [name] was seen today in follow-up. Over the last few months she's had no new complaints. He denies any chest pain or worsening shortness of breath. He was seen about his knee pain and told he  has arthritis. His EF was 45% on echo and recently was subjected to a valsartan and recall. He was switched over to irbesartan and seems to be tolerating that well. Blood pressure is reasonably well controlled. He should be on dual antiplatelet therapy and at least until October 2018.  11/18/2017  Mr. Cory [name] returns today for follow-up.  Overall is without complaints.  EKG shows sinus rhythm at 60.  He is now 1 year since his ST elevation MI.  We had  discussed possibly discontinuing his Effient.  He denies any further chest pain.  He remains physically active.  05/21/2018  Mr. Cory [name] was seen today in follow-up.  He seems to be doing really well.  He denies any chest pain or worsening shortness of breath.  He had a repeat echo that unfortunately does not show any improvement in LVEF which is still 45% with inferior hypokinesis.  The LVEF however has not worsened.  He is on irbesartan, but not is not on a beta-blocker.  He takes aspirin monotherapy.  He had a lipid profile drawn this morning however the results are pending.  PMHx:  Past Medical History:  Diagnosis Date  . Anemia   . Arthritis   . Coronary artery disease    a. 09/2001 PCI/BMS to D1: 2.5 x 15 Express 2 BMS;  b. 2011 Cath: stable anatomy; c. 03/2014 Low risk MV, EF 54%;  d. 09/2016 Inf STEMI/PCI [facility]): LM nl, LAD 40m D1 nl w/ 90% in 13minf branch, LCX nl, RCA 99 (Synergy DES x 4 - 3.5x28 prox, 3.5x3817m.5x16d, 2.25x16 to RPDA), EF 55%.  . GMarland KitchenRD (gastroesophageal reflux disease)   . Hyperlipidemia   . Hypertensive heart disease   . Myocardial infarction ([id number]  . Pneumonia   . PTSD (post-traumatic stress disorder)     Past Surgical History:  Procedure Laterality Date  . CARDIAC CATHETERIZATION  05/17/2010   left main free of significant lesion; LAD with 60% lesion in mid segment & 60-70% lesion in mid-distal segment with subtotal occlusion at apex & small side branch of diagonal 1 that is subtotally occluded; ramus intermedius with 90% ostial stenosis; L Cfx w/mild luminal irreg; RCA is dominant vessel with diffuse disease, 50% prox lesion & 60% lesion in mid segment & 60% lesion in PDA  . CATARACT EXTRACTION W/PHACO Right 06/07/2015   Procedure: CATARACT EXTRACTION PHACO AND INTRAOCULAR LENS PLACEMENT (IOC);  Surgeon: RoyMarylynn PearsonD;  [facility];  Laterality: Right;  . COLONOSCOPY  02/25/2012   Procedure: COLONOSCOPY;   Surgeon: MarJamesetta SoD;  [facility];  Service: Gastroenterology;  Laterality: N/A;  . CORONARY ANGIOPLASTY WITH STENT PLACEMENT  09/2001   Taxus (2.5x15m46mtent to 1st diagonal   . ESOPHAGEAL DILATION N/A 08/14/2016   Procedure: ESOPHAGEAL DILATION;  Surgeon: NajeRogene Houston;  [facility];  Service: Endoscopy;  Laterality: N/A;  . ESOPHAGOGASTRODUODENOSCOPY  02/25/2012   Procedure: ESOPHAGOGASTRODUODENOSCOPY (EGD);  Surgeon: MarkJamesetta So;  [facility];  Service: Gastroenterology;  Laterality: N/A;  . ESOPHAGOGASTRODUODENOSCOPY N/A 07/28/2014   Procedure: ESOPHAGOGASTRODUODENOSCOPY (EGD);  Surgeon: NajeRogene Houston;  [facility];  Service: Endoscopy;  Laterality: N/A;  200-rescheduled to 245 Turinified pt  . ESOPHAGOGASTRODUODENOSCOPY N/A 08/14/2016   Procedure: ESOPHAGOGASTRODUODENOSCOPY (EGD);  Surgeon: NajeRogene Houston;  [facility];  Service: Endoscopy;  Laterality: N/A;  100  . EYE SURGERY Bilateral  lasik surgery   . KNEE ARTHROSCOPY WITH MEDIAL MENISECTOMY Left 02/05/2018   Procedure: LEFT KNEE ARTHROSCOPY WITH PARTIAL MEDIAL MENISECTOMY;  Surgeon: Carole Civil, MD;  Location: [facility];  Service: Orthopedics;  Laterality: Left;  . KNEE SURGERY    . LATERAL EPICONDYLE RELEASE Left 01/21/2014   Procedure: Left Tennis Elbow Release with debridement  tendon repair with reattachment;  Surgeon: Carole Civil, MD;  Location: [facility];  Service: Orthopedics;  Laterality: Left;  . penile implant    . PROSTATE SURGERY    . SHOULDER SURGERY    . TONSILLECTOMY    . TRANSTHORACIC ECHOCARDIOGRAM  03/2010   EF=>55%; LV mildly dilated; LA mod dilated; mild MR & TR; normal RVSP; mild pulm valve regurg    FAMHx:  Family History  Problem Relation Age of Onset  . Colitis Maternal Aunt     SOCHx:   reports that he has never smoked. He has never used smokeless tobacco. He reports that he does not drink alcohol or  use drugs.  ALLERGIES:  Allergies  Allergen Reactions  . Ace Inhibitors     unknown  . Benadryl [Diphenhydramine Hcl]     Opposite reaction of medication purpose - hyper  . Vioxx [Rofecoxib] Other (See Comments)    headache    ROS: Pertinent items noted in HPI and remainder of comprehensive ROS otherwise negative.  HOME MEDS: Current Outpatient Medications  Medication Sig Dispense Refill  . acetaminophen (TYLENOL) 325 MG tablet Take 650 mg by mouth daily as needed for moderate pain or headache.    Marland Kitchen aspirin 81 MG tablet Take 81 mg by mouth every evening.     Marland Kitchen atorvastatin (LIPITOR) 80 MG tablet Take 1 tablet (80 mg total) by mouth daily. (Patient taking differently: Take 80 mg by mouth every evening. ) 90 tablet 3  . buPROPion (WELLBUTRIN SR) 150 MG 12 hr tablet Take 150 mg by mouth daily.    . fluticasone (FLONASE) 50 MCG/ACT nasal spray Place 1 spray into both nostrils daily as needed for allergies or rhinitis.    Marland Kitchen gabapentin (NEURONTIN) 300 MG capsule Take 300 mg by mouth 2 (two) times daily.    Marland Kitchen HYDROcodone-acetaminophen (NORCO/VICODIN) 5-325 MG tablet Take 1 tablet by mouth every 4 (four) hours as needed for moderate pain. 30 tablet 0  . irbesartan (AVAPRO) 300 MG tablet Take 1 tablet (300 mg total) daily by mouth. 90 tablet 3  . loratadine (CLARITIN) 10 MG tablet Take 10 mg by mouth daily as needed for allergies.    . Magnesium Malate 1250 (141.7 Mg) MG TABS Take 1,250 mg by mouth daily.    . pantoprazole (PROTONIX) 40 MG tablet Take 1 tablet (40 mg total) by mouth 2 (two) times daily before a meal. 180 tablet 1  . traZODone (DESYREL) 100 MG tablet Take 100 mg by mouth at bedtime.     Marland Kitchen venlafaxine (EFFEXOR) 75 MG tablet Take 75 mg by mouth daily.     No current facility-administered medications for this visit.     LABS/IMAGING: No results found for this or any previous visit (from the past 48 hour(s)). No results found.  VITALS: BP 114/72   Pulse 70   Ht 5' 9"$   (1.753 m)   Wt 173 lb 12.8 oz (78.8 kg)   BMI 25.67 kg/m   EXAM: General appearance: alert and no distress Neck: no carotid bruit, no JVD and thyroid not enlarged, symmetric, no tenderness/mass/nodules Lungs: clear to auscultation bilaterally Heart:  regular rate and rhythm, S1, S2 normal, no murmur, click, rub or gallop Abdomen: soft, non-tender; bowel sounds normal; no masses,  no organomegaly Extremities: extremities normal, atraumatic, no cyanosis or edema Pulses: 2+ and symmetric Skin: Skin color, texture, turgor normal. No rashes or lesions Neurologic: Grossly normal Psych: Pleasant  EKG: Sinus rhythm with PVCs at 70, inferior infarct pattern  ASSESSMENT: 1. Inferior STEMI, status post 4 DES to the RCA (2017[place] 2. LVEF 45% (04/2018) 3. Coronary artery disease status post Taxus DES to the first diagonal and 2002 4. PTSD 5. Hypertension 6. Dyslipidemia  PLAN: 1.   Mr. Cory [name] continues to be asymptomatic.  His LVEF has not improved much since his last echo and remains around 45%.  Blood pressure is well controlled on irbesartan but is not on beta-blocker.  I think he benefit from low-dose beta-blocker for coronary disease, heart failure and to reduce cardiovascular events.  He has a lipid profile pending and will adjust his medications and diet accordingly.  Plan follow-up with me annually or sooner as necessary.  [name], MD, [facility], [place] Director of the Advanced Lipid Disorders &  Cardiovascular Risk Reduction Clinic Attending Cardiologist  Direct Dial: [phone]  Fax: [phone]  Website:  [email or web address] Liliahna Cudd 05/21/2018, 2:15 PM

## 2018-05-21 NOTE — Patient Instructions (Signed)
Your physician has recommended you make the following change in your medication:  -- START carvedilol 3.125mg  twice daily  Your physician wants you to follow-up in: 1 year with Dr. Debara Pickett. You will receive a reminder letter in the mail two months in advance. If you don't receive a letter, please call our office to schedule the follow-up appointment.

## 2018-05-22 LAB — LIPID PANEL
Chol/HDL Ratio: 2.4 ratio (ref 0.0–5.0)
Cholesterol, Total: 114 mg/dL (ref 100–199)
HDL: 47 mg/dL (ref >39)
LDL Calculated: 50 mg/dL (ref 0–99)
Triglycerides: 84 mg/dL (ref 0–149)
VLDL Cholesterol Cal: 17 mg/dL (ref 5–40)

## 2018-06-05 DIAGNOSIS — H9192 Unspecified hearing loss, left ear: Secondary | ICD-10-CM | POA: Diagnosis not present

## 2018-06-05 DIAGNOSIS — H6122 Impacted cerumen, left ear: Secondary | ICD-10-CM | POA: Diagnosis not present

## 2018-06-05 DIAGNOSIS — Z1389 Encounter for screening for other disorder: Secondary | ICD-10-CM | POA: Diagnosis not present

## 2018-06-05 DIAGNOSIS — E663 Overweight: Secondary | ICD-10-CM | POA: Diagnosis not present

## 2018-06-05 DIAGNOSIS — Z6825 Body mass index (BMI) 25.0-25.9, adult: Secondary | ICD-10-CM | POA: Diagnosis not present

## 2018-06-22 ENCOUNTER — Telehealth: Payer: Self-pay | Admitting: Radiology

## 2018-06-22 ENCOUNTER — Ambulatory Visit (INDEPENDENT_AMBULATORY_CARE_PROVIDER_SITE_OTHER): Payer: Medicare HMO | Admitting: Orthopedic Surgery

## 2018-06-22 VITALS — BP 132/77 | HR 69 | Ht 69.0 in | Wt 178.0 lb

## 2018-06-22 DIAGNOSIS — Z9889 Other specified postprocedural states: Secondary | ICD-10-CM | POA: Diagnosis not present

## 2018-06-22 DIAGNOSIS — M7122 Synovial cyst of popliteal space [Baker], left knee: Secondary | ICD-10-CM | POA: Diagnosis not present

## 2018-06-22 DIAGNOSIS — M1712 Unilateral primary osteoarthritis, left knee: Secondary | ICD-10-CM | POA: Diagnosis not present

## 2018-06-22 NOTE — Telephone Encounter (Signed)
I called to make sure he is aware of preop time. Kim did call him to advise already.

## 2018-06-22 NOTE — Patient Instructions (Signed)
You have decided to proceed with knee replacement surgery. You have decided not to continue with nonoperative measures such as but not limited to oral medication, weight loss, activity modification, physical therapy, bracing, or injection.  We will perform the procedure commonly known as total knee replacement. Some of the risks associated with knee replacement surgery include but are not limited to Bleeding Infection Swelling Stiffness Blood clot Pain that persists even after surgery  Infection is especially devastating complication of knee surgery although rare. If infection does occur your implant will usually have to be removed and several surgeries and antibiotics will be needed to eradicate the infection prior to performing a repeat replacement.   In some cases amputation is required to eradicate the infection. In other rare cases a knee fusion is needed   In compliance with recent [place] law in federal regulation regarding opioid use and abuse and addiction, we will taper (stop) opioid medication after 6 weeks.  If you're not comfortable with these risks and would like to continue with nonoperative treatment please let Dr. Vonetta Foulk know prior to your surgery.  

## 2018-06-22 NOTE — Progress Notes (Signed)
Progress Note   Patient ID: Cory [name], male   DOB: 1946/04/27, 72 y.o.   MRN: [id number] Chief Complaint  Patient presents with  . Follow-up    Recheck on left knee.     Chief Complaint  Patient presents with  . Follow-up    Recheck on left knee.    72 year old male had knee arthroscopy did well for about a day or 2 was able to walk normally but over the last several months he has had posterior knee pain and swelling associated with some anterior knee pain.  He had a Baker's cyst aspirated did well for couple days and then the pain came back.  He has intermittent pain and swelling with night pain and swelling at the end of the day unrelieved by gabapentin Tylenol he refuses any opioid medications.  He has severe pain at night and he cannot do his normal activities and he is sort of at the end of his rope    Review of Systems  Constitutional: Negative for chills and fever.  Respiratory: Positive for shortness of breath.   Cardiovascular: Positive for chest pain.  Musculoskeletal: Negative for back pain.  Neurological: Negative for tingling and sensory change.   No outpatient medications have been marked as taking for the 06/22/18 encounter (Office Visit) with Carole Civil, MD.    Past Medical History:  Diagnosis Date  . Anemia   . Arthritis   . Coronary artery disease    a. 09/2001 PCI/BMS to D1: 2.5 x 15 Express 2 BMS;  b. 2011 Cath: stable anatomy; c. 03/2014 Low risk MV, EF 54%;  d. 09/2016 Inf STEMI/PCI [facility]): LM nl, LAD 30m D1 nl w/ 90% in 134minf branch, LCX nl, RCA 99 (Synergy DES x 4 - 3.5x28 prox, 3.5x3856m.5x16d, 2.25x16 to RPDA), EF 55%.  . GMarland KitchenRD (gastroesophageal reflux disease)   . Hyperlipidemia   . Hypertensive heart disease   . Myocardial infarction ([id number]  . Pneumonia   . PTSD (post-traumatic stress disorder)      Allergies  Allergen Reactions  . Ace Inhibitors     unknown  . Benadryl [Diphenhydramine Hcl]    Opposite reaction of medication purpose - hyper  . Vioxx [Rofecoxib] Other (See Comments)    headache     BP 132/77   Pulse 69   Ht '5\' 9"'$  (1.753 m)   Wt 178 lb (80.7 kg)   BMI 26.29 kg/m    Physical Exam  Constitutional: He is oriented to person, place, and time. He appears well-developed and well-nourished.  Vital signs have been reviewed and are stable. Gen. appearance the patient is well-developed and well-nourished with normal grooming and hygiene.   Musculoskeletal:       Left knee: He exhibits effusion.  Neurological: He is alert and oriented to person, place, and time.  Skin: Skin is warm and dry. No erythema.  Psychiatric: He has a normal mood and affect.  Vitals reviewed.   Right Knee Exam   Muscle Strength  The patient has normal right knee strength.  Tenderness  The patient is experiencing no tenderness.   Range of Motion  Extension: normal  Flexion: normal   Tests  McMurray:  Medial - negative Lateral - negative Varus: negative Valgus: negative Drawer:  Anterior - negative    Posterior - negative  Other  Erythema: absent Scars: absent Sensation: normal Pulse: present Swelling: none   Left Knee Exam   Muscle Strength  The  patient has normal left knee strength.  Tenderness  The patient is experiencing no tenderness.   Range of Motion  Extension: normal  Flexion:  120 normal   Tests  McMurray:  Medial - negative Lateral - negative Varus: negative Valgus: negative Drawer:  Anterior - negative     Posterior - negative  Other  Erythema: absent Scars: absent Sensation: normal Pulse: present Swelling: none Effusion: effusion present  Comments:  Painful Baker's cyst      [name], MD   MEDICAL DECISION MAKING   Imaging:  Prior imagingRadiology report as dictated by Dr. Aline Brochure   Patient presented with a acute onset of posterior knee pain which has subsequently resolved   He does have a small effusion. His overall  alignment is about 4 of valgus between the tibia and femur   He has mild subchondral sclerosis of the medial plateau. He has moderate amount of joint space narrowing medially mild laterally   No fracture is seen. The bone quality looks normal.   Impression arthritis medial and lateral compartment mild to moderate.   NEW PROBLEM  Encounter Diagnoses  Name Primary?  . S/P left knee arthroscopy 02/05/18 Yes  . Cyst, baker's knee, left   . Arthritis of left knee    Surgical dictation2/06/2018  10:42 AM  PATIENT:  Cory [name]  72 y.o. male  PRE-OPERATIVE DIAGNOSIS:  left knee pain; torn medial meniscus  POST-OPERATIVE DIAGNOSIS:  left knee pain; torn medial meniscus  PROCEDURE:  Procedure(s): LEFT KNEE ARTHROSCOPY WITH PARTIAL MEDIAL MENISECTOMY (Left)   Surgical findings were torn medial meniscus posterior horn mild chondromalacia throughout the knee free edge tearing of the lateral meniscus anterior horn and significant  SURGEON:  Surgeon(s) and Role:    Carole Civil, MD - Primary  PHYSICIAN ASSISTANT:   ASSISTANTS: none   ANESTHESIA:   general  EBL:  0 mL   BLOOD ADMINISTERED:none  DRAINS: none   LOCAL MEDICATIONS USED:  MARCAINE     SPECIMEN:  No Specimen  DISPOSITION OF SPECIMEN:  N/A  COUNTS:  YES  TOURNIQUET:  * Missing tourniquet times found for documented tourniquets in log: [id number] *  DICTATION: .Dragon Dictation  PLAN OF CARE: Discharge to home after PACU  PATIENT DISPOSITION:  PACU - hemodynamically stable.   Delay start of Pharmacological VTE agent (>24hrs) due to surgical blood loss or risk of bleeding: not applicable  [id number]  Knee arthroscopy dictation  The patient was identified in the preoperative holding area using 2 approved identification mechanisms. The chart was reviewed and updated. The surgical site was confirmed as left knee and marked with an indelible marker.  The patient was taken to the operating room for  anesthesia. After successful general anesthesia, Ancef 2 g was used as IV antibiotics.  The patient was placed in the supine position with the (left) the operative extremity in an arthroscopic leg holder and the opposite extremity in a padded leg holder.  The timeout was executed.  A lateral portal was established with an 11 blade and the scope was introduced into the joint. A diagnostic arthroscopy was performed in circumferential manner examining the entire knee joint. A medial portal was established and the diagnostic arthroscopy was repeated using a probe to palpate intra-articular structures as they were encountered.   Primary findings torn medial meniscus left knee  The medial meniscus was resected using a duckbill forceps. The meniscal fragments were removed with a motorized shaver. The meniscus was balanced with a  combination of a motorized shaver and a an arthroscopic shaver was used to remove the ArthroCare wand until a stable rim was obtained.  Free edge tear of the lateral meniscus which was very small and significant  The arthroscopic pump was placed on the wash mode and any excess debris was removed from the joint using suction.  60 cc of Marcaine with epinephrine was injected through the arthroscope.  The portals were closed with 3-0 nylon suture.  A sterile bandage, Ace wrap and Cryo/Cuff was placed and the Cryo/Cuff was activated. The patient was taken to the recovery room in stable condition.    PLAN: (RX., injection, surgery,frx,mri/ct, XR 2 body ares)  Left total knee arthroplasty for August 15   The procedure has been fully reviewed with the patient; The risks and benefits of surgery have been discussed and explained and understood. Alternative treatment has also been reviewed, questions were encouraged and answered. The postoperative plan is also been reviewed.    10:24 AM 06/22/2018

## 2018-06-22 NOTE — Telephone Encounter (Signed)
-----   Message from Nils Flack sent at 06/22/2018 12:07 PM EDT ----- Regarding: preop and surgery date and time Hi Laquanda Bick,  I have scheduled the above patient for preop on 08/10/18 @ 1:45pm and his surgery is on 08/13/18 @ 7:30am.  Thank you,  Lorre Nick.

## 2018-06-22 NOTE — Addendum Note (Signed)
Addended byCandice Camp on: 06/22/2018 10:48 AM   Modules accepted: Orders, SmartSet

## 2018-07-01 ENCOUNTER — Telehealth: Payer: Self-pay | Admitting: Orthopedic Surgery

## 2018-07-01 DIAGNOSIS — M7122 Synovial cyst of popliteal space [Baker], left knee: Secondary | ICD-10-CM

## 2018-07-01 NOTE — Telephone Encounter (Signed)
OK TO SCHED WITHOUT APPT

## 2018-07-01 NOTE — Telephone Encounter (Signed)
Cory [name] came by this morning stating that he has fluid on his knee again.  He wants [place] to refer him back to [facility] to have this drawn off like was done before.  He asked if I could get with you and/or Dr. Aline Brochure.  He wasn't sure if he could call and set this up or if we had to schedule it.  He asked for you to give him a call.  Thanks

## 2018-07-01 NOTE — Telephone Encounter (Signed)
Put in the order, called GI to advise. Called patient to advise.

## 2018-07-01 NOTE — Telephone Encounter (Signed)
Last time they injected Depomedrol, he is scheduled to have total knee replacement, do you just want them to aspirate it w/o the depomedrol, or okay to use it?

## 2018-07-01 NOTE — Telephone Encounter (Signed)
Yes no depo

## 2018-07-16 ENCOUNTER — Telehealth: Payer: Self-pay | Admitting: Orthopedic Surgery

## 2018-07-16 DIAGNOSIS — M25562 Pain in left knee: Secondary | ICD-10-CM | POA: Diagnosis not present

## 2018-07-16 DIAGNOSIS — M7122 Synovial cyst of popliteal space [Baker], left knee: Secondary | ICD-10-CM | POA: Diagnosis not present

## 2018-07-16 DIAGNOSIS — M25462 Effusion, left knee: Secondary | ICD-10-CM | POA: Diagnosis not present

## 2018-07-16 DIAGNOSIS — Z9889 Other specified postprocedural states: Secondary | ICD-10-CM | POA: Diagnosis not present

## 2018-07-16 DIAGNOSIS — M1712 Unilateral primary osteoarthritis, left knee: Secondary | ICD-10-CM | POA: Diagnosis not present

## 2018-07-16 NOTE — Telephone Encounter (Signed)
Patient cancelled surgery  To you FYI   I have another patient who wanted to move surgery to this day, so I have messaged patient to fill this spot.

## 2018-07-16 NOTE — Telephone Encounter (Signed)
Patient went to a knee specialist today and it was determined that surgery would not change anything. Patient has decided to cancel his surgery on 8/15 and his pre-op on 8/12. Stated he will continue to see the knee specialist now. I will cancel his POST OP appointment.  Please advise

## 2018-07-28 ENCOUNTER — Ambulatory Visit
Admission: RE | Admit: 2018-07-28 | Discharge: 2018-07-28 | Disposition: A | Discharge status: Home / Self Care | Payer: Medicare HMO | Source: Ambulatory Visit | Attending: Orthopedic Surgery | Admitting: Orthopedic Surgery

## 2018-07-28 ENCOUNTER — Other Ambulatory Visit: Payer: Self-pay | Admitting: Orthopedic Surgery

## 2018-07-28 DIAGNOSIS — M1712 Unilateral primary osteoarthritis, left knee: Secondary | ICD-10-CM | POA: Diagnosis not present

## 2018-07-28 DIAGNOSIS — Z9889 Other specified postprocedural states: Secondary | ICD-10-CM | POA: Diagnosis not present

## 2018-07-28 DIAGNOSIS — M7122 Synovial cyst of popliteal space [Baker], left knee: Secondary | ICD-10-CM

## 2018-07-28 DIAGNOSIS — G8929 Other chronic pain: Secondary | ICD-10-CM | POA: Diagnosis not present

## 2018-07-28 NOTE — Progress Notes (Signed)
Interventional Radiology Progress Note  Mr Sites arrives for an US guided left bakers cyst aspiration/injection.    On [place], there is only scan fluid, and the treatment was deferred.    I discussed the findings with the patient, who understands.    Signed,  Dulcy Fanny. Earleen Newport, DO

## 2018-08-10 ENCOUNTER — Other Ambulatory Visit (HOSPITAL_COMMUNITY): Payer: Medicare HMO

## 2018-08-13 ENCOUNTER — Inpatient Hospital Stay: Admit: 2018-08-13 | Payer: Medicare HMO | Admitting: Orthopedic Surgery

## 2018-08-13 SURGERY — ARTHROPLASTY, KNEE, TOTAL
Anesthesia: Choice | Laterality: Left

## 2018-08-17 ENCOUNTER — Telehealth: Payer: Self-pay | Admitting: *Deleted

## 2018-08-17 NOTE — Telephone Encounter (Signed)
PRIMARY CARDIOLOGIST - DR Farrel [facility] Pre-operative Risk Assessment    Request for surgical clearance:  1. What type of surgery is being performed? ORTHOPAEDIC  PROCEDURE   2. When is this surgery scheduled? TBD  3. What type of clearance is required (medical clearance vs. Pharmacy clearance to hold med vs. Both)? MEDICAL  4. Are there any medications that need to be held prior to surgery and how long? ASPIRIN 81 MG  5. Practice name and name of physician performing surgery?  [facility] [place]  6. What is your office phone number [phone]   7.   What is your office fax number   8.   Anesthesia type (None, local, MAC, general) ?    Raiford Simmonds 08/17/2018, 5:42 PM  _________________________________________________________________   (provider comments below)

## 2018-08-18 ENCOUNTER — Telehealth: Payer: Self-pay

## 2018-08-18 NOTE — Telephone Encounter (Signed)
   [facility] Pre-operative Risk Assessment    Request for surgical clearance:  1. What type of surgery is being performed? Major orthopaedic procedure   2. When is this surgery scheduled? TBD  3. What type of clearance is required (medical clearance vs. Pharmacy clearance to hold med vs. Both)? Both  4. Are there any medications that need to be held prior to surgery and how long? Aspirin   5. Practice name and name of physician performing surgery? [facility]   Dr.Stephen Lucey  6. What is your office phone number [phone]    7.   What is your office fax number [phone]  8.   Anesthesia type (None, local, MAC, general) ? Not listed   Kathyrn Lass 08/18/2018, 11:36 AM  _________________________________________________________________   (provider comments below)

## 2018-08-24 ENCOUNTER — Telehealth: Payer: Self-pay | Admitting: Internal Medicine

## 2018-08-24 NOTE — Telephone Encounter (Signed)
Ok to hold aspirin 7 days prior to case if necessary (may not be) per orthopedics. Resume after.  -Mali

## 2018-08-24 NOTE — Telephone Encounter (Signed)
New Message:    Pt is calling regarding the surgical clearance.

## 2018-08-24 NOTE — Telephone Encounter (Signed)
Dr. Debara Pickett, can you please comment on holding ASA?  When I receive this response, I will follow up with a phone call to the patient to complete clearance.

## 2018-08-24 NOTE — Telephone Encounter (Signed)
Called patient, advised that clearance was sent to [facility] and they would look over his chart to verify if cleared and that information would be sent to [facility].   Patient verified understanding.

## 2018-08-25 DIAGNOSIS — Z0001 Encounter for general adult medical examination with abnormal findings: Secondary | ICD-10-CM | POA: Diagnosis not present

## 2018-08-25 DIAGNOSIS — R69 Illness, unspecified: Secondary | ICD-10-CM | POA: Diagnosis not present

## 2018-08-25 DIAGNOSIS — I2510 Atherosclerotic heart disease of native coronary artery without angina pectoris: Secondary | ICD-10-CM | POA: Diagnosis not present

## 2018-08-25 DIAGNOSIS — Z01810 Encounter for preprocedural cardiovascular examination: Secondary | ICD-10-CM | POA: Diagnosis not present

## 2018-08-25 DIAGNOSIS — Z1389 Encounter for screening for other disorder: Secondary | ICD-10-CM | POA: Diagnosis not present

## 2018-08-25 DIAGNOSIS — Z6825 Body mass index (BMI) 25.0-25.9, adult: Secondary | ICD-10-CM | POA: Diagnosis not present

## 2018-08-25 DIAGNOSIS — D508 Other iron deficiency anemias: Secondary | ICD-10-CM | POA: Diagnosis not present

## 2018-08-25 DIAGNOSIS — E782 Mixed hyperlipidemia: Secondary | ICD-10-CM | POA: Diagnosis not present

## 2018-08-25 DIAGNOSIS — R7309 Other abnormal glucose: Secondary | ICD-10-CM | POA: Diagnosis not present

## 2018-08-25 DIAGNOSIS — I10 Essential (primary) hypertension: Secondary | ICD-10-CM | POA: Diagnosis not present

## 2018-08-25 DIAGNOSIS — E663 Overweight: Secondary | ICD-10-CM | POA: Diagnosis not present

## 2018-08-25 NOTE — Telephone Encounter (Signed)
   Primary Cardiologist: [name], MD  Chart reviewed as part of pre-operative protocol coverage. Patient was contacted 08/25/2018 in reference to pre-operative risk assessment for pending surgery as outlined below.  Cory [name] was last seen on 05/21/18 by Dr. Debara Pickett.  Since that day, Cory [name] has done well. He can complete more than 4.0 METS.   Regarding ASA, per Dr. Debara Pickett: Ok to hold aspirin 7 days prior to case if necessary (may not be) per orthopedics. Resume after.  Therefore, based on ACC/AHA guidelines, the patient would be at acceptable risk for the planned procedure without further cardiovascular testing.   I will route this recommendation to the requesting party via Epic fax function and remove from pre-op pool.  Please call with questions.  Tami Lin Duke, PA 08/25/2018, 3:26 PM

## 2018-08-25 NOTE — Telephone Encounter (Signed)
Left voicemail on home and cell to call back.

## 2018-08-28 ENCOUNTER — Ambulatory Visit: Payer: Medicare HMO | Admitting: Orthopedic Surgery

## 2018-09-02 NOTE — Telephone Encounter (Signed)
Covering staff, why this sent today? Seen received 8/19. Also needs missing information >> type of procedure, fax # and type of anesthesia.

## 2018-09-03 NOTE — Telephone Encounter (Signed)
Spoke with [facility] @ [facility] [place] re: surgical clearance, to get more information. Per Cory [name], she already has the clearance. After searching pts chart, it has already been sent.  Will close this encounter and delete it from preop pool.

## 2018-09-07 ENCOUNTER — Other Ambulatory Visit: Payer: Self-pay | Admitting: Orthopedic Surgery

## 2018-10-07 NOTE — Patient Instructions (Addendum)
Cory [name]  10/07/2018   Your procedure is scheduled on: 10-19-18     Report to [facility] Main  Entrance    Report to Admitting at 8:30 AM    Call this number if you have problems the morning of surgery [phone]     Remember: Do not eat food or drink liquids :After Midnight.      Take these medicines the morning of surgery with A SIP OF WATER: Bupropion (Wellbutrin), Carvedilol (Coreg), Pantoprazole (Protonix), Venlafaxine (Effexor), Gabapentin (Neurotin), and Loratadine (Claritin). You may also bring and use your inhaler as needed.                                 You may not have any metal on your body including hair pins and              piercings  Do not wear jewelry, lotions, powders, cologne or deodorant             Men may shave face and neck.   Do not bring valuables to the hospital. Cory [name].  Contacts, dentures or bridgework may not be worn into surgery.  Leave suitcase in the car. After surgery it may be brought to your room.    Special Instructions: Please bring and use your Healthcare Power of Attorney or Living Will             Please read over the following fact sheets you were given: _____________________________________________________________________             [facility] - Preparing for Surgery Before surgery, you can play an important role.  Because skin is not sterile, your skin needs to be as free of germs as possible.  You can reduce the number of germs on your skin by washing with CHG (chlorahexidine gluconate) soap before surgery.  CHG is an antiseptic cleaner which kills germs and bonds with the skin to continue killing germs even after washing. Please DO NOT use if you have an allergy to CHG or antibacterial soaps.  If your skin becomes reddened/irritated stop using the CHG and inform your nurse when you arrive at Short Stay. Do not shave (including legs and  underarms) for at least 48 hours prior to the first CHG shower.  You may shave your face/neck. Please follow these instructions carefully:  1.  Shower with CHG Soap the night before surgery and the  morning of Surgery.  2.  If you choose to wash your hair, wash your hair first as usual with your  normal  shampoo.  3.  After you shampoo, rinse your hair and body thoroughly to remove the  shampoo.                           4.  Use CHG as you would any other liquid soap.  You can apply chg directly  to the skin and wash                       Gently with a scrungie or clean washcloth.  5.  Apply the CHG Soap to your body ONLY FROM THE NECK DOWN.   Do  not use on face/ open                           Wound or open sores. Avoid contact with eyes, ears mouth and genitals (private parts).                       Wash face,  Genitals (private parts) with your normal soap.             6.  Wash thoroughly, paying special attention to the area where your surgery  will be performed.  7.  Thoroughly rinse your body with warm water from the neck down.  8.  DO NOT shower/wash with your normal soap after using and rinsing off  the CHG Soap.                9.  Pat yourself dry with a clean towel.            10.  Wear clean pajamas.            11.  Place clean sheets on your bed the night of your first shower and do not  sleep with pets. Day of Surgery : Do not apply any lotions/deodorants the morning of surgery.  Please wear clean clothes to the hospital/surgery center.  FAILURE TO FOLLOW THESE INSTRUCTIONS MAY RESULT IN THE CANCELLATION OF YOUR SURGERY PATIENT SIGNATURE_________________________________  NURSE SIGNATURE__________________________________  ________________________________________________________________________   Cory [name]  An incentive spirometer is a tool that can help keep your lungs clear and active. This tool measures how well you are filling your lungs with each breath. Taking  long deep breaths may help reverse or decrease the chance of developing breathing (pulmonary) problems (especially infection) following:  A long period of time when you are unable to move or be active. BEFORE THE PROCEDURE   If the spirometer includes an indicator to show your best effort, your nurse or respiratory therapist will set it to a desired goal.  If possible, sit up straight or lean slightly forward. Try not to slouch.  Hold the incentive spirometer in an upright position. INSTRUCTIONS FOR USE  1. Sit on the edge of your bed if possible, or sit up as far as you can in bed or on a chair. 2. Hold the incentive spirometer in an upright position. 3. Breathe out normally. 4. Place the mouthpiece in your mouth and seal your lips tightly around it. 5. Breathe in slowly and as deeply as possible, raising the piston or the ball toward the top of the column. 6. Hold your breath for 3-5 seconds or for as long as possible. Allow the piston or ball to fall to the bottom of the column. 7. Remove the mouthpiece from your mouth and breathe out normally. 8. Rest for a few seconds and repeat Steps 1 through 7 at least 10 times every 1-2 hours when you are awake. Take your time and take a few normal breaths between deep breaths. 9. The spirometer may include an indicator to show your best effort. Use the indicator as a goal to work toward during each repetition. 10. After each set of 10 deep breaths, practice coughing to be sure your lungs are clear. If you have an incision (the cut made at the time of surgery), support your incision when coughing by placing a pillow or rolled up towels firmly against it. Once you are able to get out of bed,  walk around indoors and cough well. You may stop using the incentive spirometer when instructed by your caregiver.  RISKS AND COMPLICATIONS  Take your time so you do not get dizzy or light-headed.  If you are in pain, you may need to take or ask for pain  medication before doing incentive spirometry. It is harder to take a deep breath if you are having pain. AFTER USE  Rest and breathe slowly and easily.  It can be helpful to keep track of a log of your progress. Your caregiver can provide you with a simple table to help with this. If you are using the spirometer at home, follow these instructions: [place] IF:   You are having difficultly using the spirometer.  You have trouble using the spirometer as often as instructed.  Your pain medication is not giving enough relief while using the spirometer.  You develop fever of 100.5 F (38.1 C) or higher. SEEK IMMEDIATE MEDICAL CARE IF:   You cough up bloody sputum that had not been present before.  You develop fever of 102 F (38.9 C) or greater.  You develop worsening pain at or near the incision site. MAKE SURE YOU:   Understand these instructions.  Will watch your condition.  Will get help right away if you are not doing well or get worse. Document Released: 04/28/2007 Document Revised: 03/09/2012 Document Reviewed: 06/29/2007 [facility] Patient Information 2014 [place], [place].   ________________________________________________________________________

## 2018-10-07 NOTE — Progress Notes (Addendum)
  8-20/19 Cardiac Clearance from Ledora Bottcher, PA on chart  05-21-18 (Epic) EKG  05-18-18 (Epic) ECHO

## 2018-10-09 ENCOUNTER — Other Ambulatory Visit: Payer: Self-pay

## 2018-10-09 ENCOUNTER — Encounter (HOSPITAL_COMMUNITY): Payer: Self-pay

## 2018-10-09 ENCOUNTER — Encounter (HOSPITAL_COMMUNITY)
Admission: RE | Admit: 2018-10-09 | Discharge: 2018-10-09 | Disposition: A | Discharge status: Home / Self Care | Payer: Medicare HMO | Source: Ambulatory Visit | Attending: Orthopedic Surgery | Admitting: Orthopedic Surgery

## 2018-10-09 DIAGNOSIS — M1712 Unilateral primary osteoarthritis, left knee: Secondary | ICD-10-CM | POA: Diagnosis not present

## 2018-10-09 DIAGNOSIS — Z01812 Encounter for preprocedural laboratory examination: Secondary | ICD-10-CM | POA: Insufficient documentation

## 2018-10-09 LAB — CBC WITH DIFFERENTIAL/PLATELET
Abs Immature Granulocytes: 0.02 10*3/uL (ref 0.00–0.07)
Basophils Absolute: 0.1 10*3/uL (ref 0.0–0.1)
Basophils Relative: 1 %
Eosinophils Absolute: 0.5 10*3/uL (ref 0.0–0.5)
Eosinophils Relative: 9 %
HCT: 42.7 % (ref 39.0–52.0)
Hemoglobin: 13.9 g/dL (ref 13.0–17.0)
Immature Granulocytes: 0 %
Lymphocytes Relative: 35 %
Lymphs Abs: 2 10*3/uL (ref 0.7–4.0)
MCH: 29.9 pg (ref 26.0–34.0)
MCHC: 32.6 g/dL (ref 30.0–36.0)
MCV: 91.8 fL (ref 80.0–100.0)
Monocytes Absolute: 0.7 10*3/uL (ref 0.1–1.0)
Monocytes Relative: 13 %
Neutro Abs: 2.4 10*3/uL (ref 1.7–7.7)
Neutrophils Relative %: 42 %
Platelets: 170 10*3/uL (ref 150–400)
RBC: 4.65 MIL/uL (ref 4.22–5.81)
RDW: 12.6 % (ref 11.5–15.5)
WBC: 5.7 10*3/uL (ref 4.0–10.5)
nRBC: 0 % (ref 0.0–0.2)

## 2018-10-09 LAB — COMPREHENSIVE METABOLIC PANEL
ALT: 22 U/L (ref 0–44)
AST: 21 U/L (ref 15–41)
Albumin: 4.5 g/dL (ref 3.5–5.0)
Alkaline Phosphatase: 79 U/L (ref 38–126)
Anion gap: 8 (ref 5–15)
BUN: 22 mg/dL (ref 8–23)
CO2: 28 mmol/L (ref 22–32)
Calcium: 9.9 mg/dL (ref 8.9–10.3)
Chloride: 107 mmol/L (ref 98–111)
Creatinine, Ser: 1.08 mg/dL (ref 0.61–1.24)
GFR calc Af Amer: >60 mL/min (ref >60)
GFR calc non Af Amer: >60 mL/min (ref >60)
Glucose, Bld: 87 mg/dL (ref 70–99)
Potassium: 4.9 mmol/L (ref 3.5–5.1)
Sodium: 143 mmol/L (ref 135–145)
Total Bilirubin: 1.2 mg/dL (ref 0.3–1.2)
Total Protein: 7.4 g/dL (ref 6.5–8.1)

## 2018-10-09 LAB — SURGICAL PCR SCREEN
MRSA, PCR: NEGATIVE
Staphylococcus aureus: NEGATIVE

## 2018-10-18 MED ORDER — BUPIVACAINE LIPOSOME 1.3 % IJ SUSP
20.0000 mL | INTRAMUSCULAR | Status: DC
  Filled 2018-10-18: qty 20

## 2018-10-19 ENCOUNTER — Encounter (HOSPITAL_COMMUNITY): Payer: Self-pay | Admitting: Anesthesiology

## 2018-10-19 ENCOUNTER — Ambulatory Visit (HOSPITAL_COMMUNITY): Payer: No Typology Code available for payment source | Admitting: Anesthesiology

## 2018-10-19 ENCOUNTER — Observation Stay (HOSPITAL_COMMUNITY)
Admission: RE | Admit: 2018-10-19 | Discharge: 2018-10-20 | Disposition: A | Discharge status: Home Health | Payer: No Typology Code available for payment source | Source: Other Acute Inpatient Hospital | Attending: Orthopedic Surgery | Admitting: Orthopedic Surgery

## 2018-10-19 ENCOUNTER — Other Ambulatory Visit: Payer: Self-pay

## 2018-10-19 ENCOUNTER — Encounter (HOSPITAL_COMMUNITY)
Admission: RE | Disposition: A | Discharge status: Home Health | Payer: Self-pay | Source: Other Acute Inpatient Hospital | Attending: Orthopedic Surgery

## 2018-10-19 DIAGNOSIS — I255 Ischemic cardiomyopathy: Secondary | ICD-10-CM | POA: Diagnosis not present

## 2018-10-19 DIAGNOSIS — I252 Old myocardial infarction: Secondary | ICD-10-CM | POA: Diagnosis not present

## 2018-10-19 DIAGNOSIS — Z7982 Long term (current) use of aspirin: Secondary | ICD-10-CM | POA: Insufficient documentation

## 2018-10-19 DIAGNOSIS — Z96659 Presence of unspecified artificial knee joint: Secondary | ICD-10-CM

## 2018-10-19 DIAGNOSIS — I2510 Atherosclerotic heart disease of native coronary artery without angina pectoris: Secondary | ICD-10-CM | POA: Diagnosis not present

## 2018-10-19 DIAGNOSIS — K219 Gastro-esophageal reflux disease without esophagitis: Secondary | ICD-10-CM | POA: Diagnosis not present

## 2018-10-19 DIAGNOSIS — Z79899 Other long term (current) drug therapy: Secondary | ICD-10-CM | POA: Diagnosis not present

## 2018-10-19 DIAGNOSIS — F4310 Post-traumatic stress disorder, unspecified: Secondary | ICD-10-CM | POA: Diagnosis not present

## 2018-10-19 DIAGNOSIS — E782 Mixed hyperlipidemia: Secondary | ICD-10-CM | POA: Insufficient documentation

## 2018-10-19 DIAGNOSIS — M1712 Unilateral primary osteoarthritis, left knee: Principal | ICD-10-CM | POA: Insufficient documentation

## 2018-10-19 DIAGNOSIS — I119 Hypertensive heart disease without heart failure: Secondary | ICD-10-CM | POA: Diagnosis not present

## 2018-10-19 HISTORY — PX: TOTAL KNEE ARTHROPLASTY: SHX125

## 2018-10-19 SURGERY — ARTHROPLASTY, KNEE, TOTAL
Anesthesia: General | Site: Knee | Laterality: Left

## 2018-10-19 MED ORDER — GABAPENTIN 300 MG PO CAPS
300.0000 mg | ORAL_CAPSULE | Freq: Three times a day (TID) | ORAL | Status: DC
  Administered 2018-10-19 – 2018-10-20 (×3): 300 mg via ORAL
  Filled 2018-10-19 (×3): qty 1

## 2018-10-19 MED ORDER — TRANEXAMIC ACID-NACL 1000-0.7 MG/100ML-% IV SOLN
1000.0000 mg | Freq: Once | INTRAVENOUS | Status: AC
  Administered 2018-10-19: 1000 mg via INTRAVENOUS
  Filled 2018-10-19: qty 100

## 2018-10-19 MED ORDER — METOCLOPRAMIDE HCL 5 MG PO TABS: 5.0000 mg | ORAL_TABLET | Freq: Three times a day (TID) | ORAL | Status: DC | PRN

## 2018-10-19 MED ORDER — FERROUS SULFATE 325 (65 FE) MG PO TABS
325.0000 mg | ORAL_TABLET | Freq: Three times a day (TID) | ORAL | Status: DC
  Administered 2018-10-20 (×2): 325 mg via ORAL
  Filled 2018-10-19 (×2): qty 1

## 2018-10-19 MED ORDER — OXYCODONE HCL 5 MG PO TABS
5.0000 mg | ORAL_TABLET | ORAL | Status: DC | PRN
  Administered 2018-10-19: 10 mg via ORAL
  Administered 2018-10-20: 5 mg via ORAL
  Filled 2018-10-19: qty 1
  Filled 2018-10-19: qty 2

## 2018-10-19 MED ORDER — FENTANYL CITRATE (PF) 100 MCG/2ML IJ SOLN
INTRAMUSCULAR | Status: AC
  Administered 2018-10-19: 50 ug via INTRAVENOUS
  Filled 2018-10-19: qty 2

## 2018-10-19 MED ORDER — EPHEDRINE 5 MG/ML INJ
INTRAVENOUS | Status: AC
  Filled 2018-10-19: qty 10

## 2018-10-19 MED ORDER — BUPROPION HCL ER (SR) 100 MG PO TB12
100.0000 mg | ORAL_TABLET | Freq: Every day | ORAL | Status: DC
  Administered 2018-10-20: 100 mg via ORAL
  Filled 2018-10-19: qty 1

## 2018-10-19 MED ORDER — TRAZODONE HCL 100 MG PO TABS
100.0000 mg | ORAL_TABLET | Freq: Every day | ORAL | Status: DC
  Administered 2018-10-19: 100 mg via ORAL
  Filled 2018-10-19: qty 1

## 2018-10-19 MED ORDER — ATORVASTATIN CALCIUM 40 MG PO TABS
80.0000 mg | ORAL_TABLET | Freq: Every day | ORAL | Status: DC
  Administered 2018-10-19: 80 mg via ORAL
  Filled 2018-10-19: qty 2

## 2018-10-19 MED ORDER — FENTANYL CITRATE (PF) 100 MCG/2ML IJ SOLN
25.0000 ug | INTRAMUSCULAR | Status: DC | PRN
  Administered 2018-10-19: 50 ug via INTRAVENOUS

## 2018-10-19 MED ORDER — GABAPENTIN 300 MG PO CAPS: 300.0000 mg | ORAL_CAPSULE | Freq: Two times a day (BID) | ORAL | Status: DC

## 2018-10-19 MED ORDER — CEFAZOLIN SODIUM-DEXTROSE 2-4 GM/100ML-% IV SOLN
2.0000 g | INTRAVENOUS | Status: AC
  Administered 2018-10-19: 2 g via INTRAVENOUS
  Filled 2018-10-19: qty 100

## 2018-10-19 MED ORDER — HYDROMORPHONE HCL 1 MG/ML IJ SOLN: 0.5000 mg | INTRAMUSCULAR | Status: DC | PRN

## 2018-10-19 MED ORDER — ALUM & MAG HYDROXIDE-SIMETH 200-200-20 MG/5ML PO SUSP: 30.0000 mL | ORAL | Status: DC | PRN

## 2018-10-19 MED ORDER — ONDANSETRON HCL 4 MG/2ML IJ SOLN
INTRAMUSCULAR | Status: DC | PRN
  Administered 2018-10-19: 4 mg via INTRAVENOUS

## 2018-10-19 MED ORDER — GABAPENTIN 300 MG PO CAPS: 300.0000 mg | ORAL_CAPSULE | Freq: Once | ORAL | Status: DC

## 2018-10-19 MED ORDER — ROPIVACAINE HCL 7.5 MG/ML IJ SOLN
INTRAMUSCULAR | Status: DC | PRN
  Administered 2018-10-19: 20 mL via PERINEURAL

## 2018-10-19 MED ORDER — ONDANSETRON HCL 4 MG PO TABS: 4.0000 mg | ORAL_TABLET | Freq: Four times a day (QID) | ORAL | Status: DC | PRN

## 2018-10-19 MED ORDER — ZOLPIDEM TARTRATE 5 MG PO TABS: 5.0000 mg | ORAL_TABLET | Freq: Every evening | ORAL | Status: DC | PRN

## 2018-10-19 MED ORDER — ONDANSETRON HCL 4 MG/2ML IJ SOLN: 4.0000 mg | Freq: Four times a day (QID) | INTRAMUSCULAR | Status: DC | PRN

## 2018-10-19 MED ORDER — BUPIVACAINE HCL (PF) 0.5 % IJ SOLN
INTRAMUSCULAR | Status: AC
  Filled 2018-10-19: qty 30

## 2018-10-19 MED ORDER — TRAMADOL HCL 50 MG PO TABS
50.0000 mg | ORAL_TABLET | Freq: Four times a day (QID) | ORAL | Status: DC
  Administered 2018-10-20 (×2): 50 mg via ORAL
  Filled 2018-10-19 (×2): qty 1

## 2018-10-19 MED ORDER — MIDAZOLAM HCL 2 MG/2ML IJ SOLN
INTRAMUSCULAR | Status: AC
  Administered 2018-10-19: 1 mg via INTRAVENOUS
  Filled 2018-10-19: qty 2

## 2018-10-19 MED ORDER — GLYCOPYRROLATE PF 0.2 MG/ML IJ SOSY
PREFILLED_SYRINGE | INTRAMUSCULAR | Status: DC | PRN
  Administered 2018-10-19: .2 mg via INTRAVENOUS

## 2018-10-19 MED ORDER — FLEET ENEMA 7-19 GM/118ML RE ENEM: 1.0000 | ENEMA | Freq: Once | RECTAL | Status: DC | PRN

## 2018-10-19 MED ORDER — METHOCARBAMOL 500 MG IVPB - SIMPLE MED
INTRAVENOUS | Status: AC
  Administered 2018-10-19: 500 mg via INTRAVENOUS
  Filled 2018-10-19: qty 50

## 2018-10-19 MED ORDER — IRBESARTAN 150 MG PO TABS
300.0000 mg | ORAL_TABLET | Freq: Every day | ORAL | Status: DC
  Administered 2018-10-19 – 2018-10-20 (×2): 300 mg via ORAL
  Filled 2018-10-19 (×2): qty 2

## 2018-10-19 MED ORDER — SODIUM CHLORIDE 0.9 % IV SOLN
INTRAVENOUS | Status: DC
  Administered 2018-10-19: 16:00:00 via INTRAVENOUS

## 2018-10-19 MED ORDER — PHENYLEPHRINE 40 MCG/ML (10ML) SYRINGE FOR IV PUSH (FOR BLOOD PRESSURE SUPPORT)
PREFILLED_SYRINGE | INTRAVENOUS | Status: AC
  Filled 2018-10-19: qty 10

## 2018-10-19 MED ORDER — EPHEDRINE SULFATE-NACL 50-0.9 MG/10ML-% IV SOSY
PREFILLED_SYRINGE | INTRAVENOUS | Status: DC | PRN
  Administered 2018-10-19 (×4): 5 mg via INTRAVENOUS
  Administered 2018-10-19: 10 mg via INTRAVENOUS
  Administered 2018-10-19: 5 mg via INTRAVENOUS
  Administered 2018-10-19: 10 mg via INTRAVENOUS
  Administered 2018-10-19: 5 mg via INTRAVENOUS

## 2018-10-19 MED ORDER — CHLORHEXIDINE GLUCONATE 4 % EX LIQD: 60.0000 mL | Freq: Once | CUTANEOUS | Status: DC

## 2018-10-19 MED ORDER — PHENYLEPHRINE 40 MCG/ML (10ML) SYRINGE FOR IV PUSH (FOR BLOOD PRESSURE SUPPORT)
PREFILLED_SYRINGE | INTRAVENOUS | Status: DC | PRN
  Administered 2018-10-19 (×5): 80 ug via INTRAVENOUS

## 2018-10-19 MED ORDER — METHOCARBAMOL 500 MG IVPB - SIMPLE MED
500.0000 mg | Freq: Four times a day (QID) | INTRAVENOUS | Status: DC | PRN
  Administered 2018-10-19: 500 mg via INTRAVENOUS
  Filled 2018-10-19: qty 50

## 2018-10-19 MED ORDER — METOCLOPRAMIDE HCL 5 MG/ML IJ SOLN: 5.0000 mg | Freq: Three times a day (TID) | INTRAMUSCULAR | Status: DC | PRN

## 2018-10-19 MED ORDER — PANTOPRAZOLE SODIUM 40 MG PO TBEC
40.0000 mg | DELAYED_RELEASE_TABLET | Freq: Two times a day (BID) | ORAL | Status: DC
  Administered 2018-10-19 – 2018-10-20 (×2): 40 mg via ORAL
  Filled 2018-10-19 (×2): qty 1

## 2018-10-19 MED ORDER — CARVEDILOL 3.125 MG PO TABS
3.1250 mg | ORAL_TABLET | Freq: Two times a day (BID) | ORAL | Status: DC
  Administered 2018-10-19 – 2018-10-20 (×2): 3.125 mg via ORAL
  Filled 2018-10-19 (×2): qty 1

## 2018-10-19 MED ORDER — TRANEXAMIC ACID-NACL 1000-0.7 MG/100ML-% IV SOLN
1000.0000 mg | INTRAVENOUS | Status: AC
  Administered 2018-10-19: 1000 mg via INTRAVENOUS
  Filled 2018-10-19: qty 100

## 2018-10-19 MED ORDER — SENNOSIDES-DOCUSATE SODIUM 8.6-50 MG PO TABS: 1.0000 | ORAL_TABLET | Freq: Every evening | ORAL | Status: DC | PRN

## 2018-10-19 MED ORDER — ACETAMINOPHEN 500 MG PO TABS
1000.0000 mg | ORAL_TABLET | Freq: Once | ORAL | Status: AC
  Administered 2018-10-19: 1000 mg via ORAL
  Filled 2018-10-19: qty 2

## 2018-10-19 MED ORDER — SODIUM CHLORIDE 0.9 % IR SOLN
Status: DC | PRN
  Administered 2018-10-19: 2000 mL

## 2018-10-19 MED ORDER — DEXAMETHASONE SODIUM PHOSPHATE 10 MG/ML IJ SOLN
8.0000 mg | Freq: Once | INTRAMUSCULAR | Status: AC
  Administered 2018-10-19: 10 mg via INTRAVENOUS

## 2018-10-19 MED ORDER — ASPIRIN EC 325 MG PO TBEC
325.0000 mg | DELAYED_RELEASE_TABLET | Freq: Two times a day (BID) | ORAL | Status: DC
  Administered 2018-10-20: 325 mg via ORAL
  Filled 2018-10-19: qty 1

## 2018-10-19 MED ORDER — STERILE WATER FOR IRRIGATION IR SOLN
Status: DC | PRN
  Administered 2018-10-19: 2000 mL

## 2018-10-19 MED ORDER — PROPOFOL 10 MG/ML IV BOLUS
INTRAVENOUS | Status: DC | PRN
  Administered 2018-10-19: 50 mg via INTRAVENOUS
  Administered 2018-10-19: 140 mg via INTRAVENOUS
  Administered 2018-10-19: 20 mg via INTRAVENOUS

## 2018-10-19 MED ORDER — FENTANYL CITRATE (PF) 100 MCG/2ML IJ SOLN
INTRAMUSCULAR | Status: AC
  Filled 2018-10-19: qty 2

## 2018-10-19 MED ORDER — METHOCARBAMOL 500 MG PO TABS: 500.0000 mg | ORAL_TABLET | Freq: Four times a day (QID) | ORAL | Status: DC | PRN

## 2018-10-19 MED ORDER — PANTOPRAZOLE SODIUM 40 MG PO TBEC: 40.0000 mg | DELAYED_RELEASE_TABLET | Freq: Every day | ORAL | Status: DC

## 2018-10-19 MED ORDER — MENTHOL 3 MG MT LOZG: 1.0000 | LOZENGE | OROMUCOSAL | Status: DC | PRN

## 2018-10-19 MED ORDER — 0.9 % SODIUM CHLORIDE (POUR BTL) OPTIME
TOPICAL | Status: DC | PRN
  Administered 2018-10-19: 1000 mL

## 2018-10-19 MED ORDER — LACTATED RINGERS IV SOLN
INTRAVENOUS | Status: DC
  Administered 2018-10-19 (×2): via INTRAVENOUS

## 2018-10-19 MED ORDER — MIDAZOLAM HCL 2 MG/2ML IJ SOLN
1.0000 mg | INTRAMUSCULAR | Status: DC
  Administered 2018-10-19: 1 mg via INTRAVENOUS

## 2018-10-19 MED ORDER — DOCUSATE SODIUM 100 MG PO CAPS
100.0000 mg | ORAL_CAPSULE | Freq: Two times a day (BID) | ORAL | Status: DC
  Administered 2018-10-19 – 2018-10-20 (×2): 100 mg via ORAL
  Filled 2018-10-19 (×2): qty 1

## 2018-10-19 MED ORDER — ONDANSETRON HCL 4 MG/2ML IJ SOLN: 4.0000 mg | Freq: Once | INTRAMUSCULAR | Status: DC | PRN

## 2018-10-19 MED ORDER — FENTANYL CITRATE (PF) 100 MCG/2ML IJ SOLN
50.0000 ug | INTRAMUSCULAR | Status: DC
  Administered 2018-10-19: 50 ug via INTRAVENOUS
  Administered 2018-10-19: 100 ug via INTRAVENOUS

## 2018-10-19 MED ORDER — VENLAFAXINE HCL 75 MG PO TABS
75.0000 mg | ORAL_TABLET | Freq: Every day | ORAL | Status: DC
  Administered 2018-10-20: 75 mg via ORAL
  Filled 2018-10-19: qty 1

## 2018-10-19 MED ORDER — LIDOCAINE 2% (20 MG/ML) 5 ML SYRINGE
INTRAMUSCULAR | Status: DC | PRN
  Administered 2018-10-19: 50 mg via INTRAVENOUS

## 2018-10-19 MED ORDER — CEFAZOLIN SODIUM-DEXTROSE 2-4 GM/100ML-% IV SOLN
2.0000 g | Freq: Four times a day (QID) | INTRAVENOUS | Status: AC
  Administered 2018-10-19 – 2018-10-20 (×2): 2 g via INTRAVENOUS
  Filled 2018-10-19 (×2): qty 100

## 2018-10-19 MED ORDER — BISACODYL 5 MG PO TBEC: 5.0000 mg | DELAYED_RELEASE_TABLET | Freq: Every day | ORAL | Status: DC | PRN

## 2018-10-19 MED ORDER — SODIUM CHLORIDE 0.9 % IJ SOLN
INTRAMUSCULAR | Status: AC
  Filled 2018-10-19: qty 30

## 2018-10-19 MED ORDER — PROPOFOL 10 MG/ML IV BOLUS
INTRAVENOUS | Status: AC
  Filled 2018-10-19: qty 60

## 2018-10-19 MED ORDER — PHENOL 1.4 % MT LIQD
1.0000 | OROMUCOSAL | Status: DC | PRN
  Filled 2018-10-19: qty 177

## 2018-10-19 MED ORDER — DEXAMETHASONE SODIUM PHOSPHATE 10 MG/ML IJ SOLN
10.0000 mg | Freq: Once | INTRAMUSCULAR | Status: AC
  Administered 2018-10-20: 10 mg via INTRAVENOUS
  Filled 2018-10-19: qty 1

## 2018-10-19 MED ORDER — ACETAMINOPHEN 500 MG PO TABS
1000.0000 mg | ORAL_TABLET | Freq: Four times a day (QID) | ORAL | Status: AC
  Administered 2018-10-19 – 2018-10-20 (×4): 1000 mg via ORAL
  Filled 2018-10-19 (×5): qty 2

## 2018-10-19 SURGICAL SUPPLY — 57 items
ARTISURF 11M PLY L 6-9EF KNEE (Knees) ×3 IMPLANT
BAG ZIPLOCK 12X15 (MISCELLANEOUS) ×3 IMPLANT
BANDAGE ACE 6X5 VEL STRL LF (GAUZE/BANDAGES/DRESSINGS) ×3 IMPLANT
BANDAGE ELASTIC 6 VELCRO ST LF (GAUZE/BANDAGES/DRESSINGS) ×3 IMPLANT
BLADE SAGITTAL 13X1.27X60 (BLADE) ×2 IMPLANT
BLADE SAGITTAL 13X1.27X60MM (BLADE) ×1
BLADE SAW SGTL 83.5X18.5 (BLADE) ×3 IMPLANT
BLADE SURG 15 STRL LF DISP TIS (BLADE) ×1 IMPLANT
BLADE SURG 15 STRL SS (BLADE) ×3
BOWL SMART MIX CTS (DISPOSABLE) ×3 IMPLANT
CEMENT BONE SIMPLEX SPEEDSET (Cement) ×3 IMPLANT
CLOSURE WOUND 1/2 X4 (GAUZE/BANDAGES/DRESSINGS) ×2
COVER SURGICAL LIGHT HANDLE (MISCELLANEOUS) ×3 IMPLANT
COVER WAND RF STERILE (DRAPES) IMPLANT
CUFF TOURN SGL QUICK 34 (TOURNIQUET CUFF) ×3
CUFF TRNQT CYL 34X4X40X1 (TOURNIQUET CUFF) ×1 IMPLANT
DECANTER SPIKE VIAL GLASS SM (MISCELLANEOUS) IMPLANT
DRAPE INCISE IOBAN 66X45 STRL (DRAPES) ×6 IMPLANT
DRAPE U-SHAPE 47X51 STRL (DRAPES) ×3 IMPLANT
DRSG AQUACEL AG ADV 3.5X10 (GAUZE/BANDAGES/DRESSINGS) ×3 IMPLANT
DURAPREP 26ML APPLICATOR (WOUND CARE) ×6 IMPLANT
ELECT REM PT RETURN 15FT ADLT (MISCELLANEOUS) ×3 IMPLANT
FEMUR  CMT CCR STD SZ9 L KNEE (Knees) ×3 IMPLANT
FEMUR CMT CCR STD SZ9 L KNEE (Knees) ×1 IMPLANT
FEMUR CMTD CCR STD SZ9 L KNEE (Knees) ×1 IMPLANT
GLOVE BIOGEL M 7.0 STRL (GLOVE) IMPLANT
GLOVE BIOGEL M STRL SZ7.5 (GLOVE) ×3 IMPLANT
GLOVE BIOGEL PI IND STRL 7.5 (GLOVE) ×1 IMPLANT
GLOVE BIOGEL PI IND STRL 8.5 (GLOVE) ×2 IMPLANT
GLOVE BIOGEL PI INDICATOR 7.5 (GLOVE) ×2
GLOVE BIOGEL PI INDICATOR 8.5 (GLOVE) ×4
GLOVE SURG ORTHO 8.0 STRL STRW (GLOVE) ×3 IMPLANT
GOWN STRL REUS W/ TWL XL LVL3 (GOWN DISPOSABLE) ×2 IMPLANT
GOWN STRL REUS W/TWL 2XL LVL3 (GOWN DISPOSABLE) ×3 IMPLANT
GOWN STRL REUS W/TWL XL LVL3 (GOWN DISPOSABLE) ×6
HANDPIECE INTERPULSE COAX TIP (DISPOSABLE) ×3
HOOD PEEL AWAY FLYTE STAYCOOL (MISCELLANEOUS) ×9 IMPLANT
MANIFOLD NEPTUNE II (INSTRUMENTS) ×3 IMPLANT
NS IRRIG 1000ML POUR BTL (IV SOLUTION) ×3 IMPLANT
PACK TOTAL KNEE CUSTOM (KITS) ×3 IMPLANT
POSITIONER SURGICAL ARM (MISCELLANEOUS) ×3 IMPLANT
SET HNDPC FAN SPRY TIP SCT (DISPOSABLE) ×1 IMPLANT
STEM POLY PAT PLY 35M KNEE (Knees) ×3 IMPLANT
STEM TIBIA 5 DEG SZ F L KNEE (Knees) ×1 IMPLANT
STRIP CLOSURE SKIN 1/2X4 (GAUZE/BANDAGES/DRESSINGS) ×4 IMPLANT
SUT BONE WAX W31G (SUTURE) ×3 IMPLANT
SUT MNCRL AB 3-0 PS2 18 (SUTURE) ×3 IMPLANT
SUT STRATAFIX 0 PDS 27 VIOLET (SUTURE) ×3
SUT STRATAFIX PDS+ 0 24IN (SUTURE) ×3 IMPLANT
SUT VIC AB 1 CT1 36 (SUTURE) ×3 IMPLANT
SUTURE STRATFX 0 PDS 27 VIOLET (SUTURE) ×1 IMPLANT
SYR CONTROL 10ML LL (SYRINGE) ×6 IMPLANT
TIBIA STEM 5 DEG SZ F L KNEE (Knees) ×3 IMPLANT
TRAY FOLEY MTR SLVR 16FR STAT (SET/KITS/TRAYS/PACK) ×3 IMPLANT
WATER STERILE IRR 1000ML POUR (IV SOLUTION) ×6 IMPLANT
WRAP KNEE MAXI GEL POST OP (GAUZE/BANDAGES/DRESSINGS) ×3 IMPLANT
YANKAUER SUCT BULB TIP 10FT TU (MISCELLANEOUS) ×3 IMPLANT

## 2018-10-19 NOTE — Anesthesia Procedure Notes (Signed)
Spinal  Patient location during procedure: OR Start time: 10/19/2018 11:15 AM End time: 10/19/2018 11:21 AM Staffing Anesthesiologist: Catalina Gravel, MD Performed: anesthesiologist  Preanesthetic Checklist Completed: patient identified, surgical consent, pre-op evaluation, timeout performed, IV checked, risks and benefits discussed and monitors and equipment checked Spinal Block Patient position: sitting Prep: site prepped and draped and DuraPrep Patient monitoring: continuous pulse ox and blood pressure Approach: midline Location: L3-4 Injection technique: single-shot Needle Needle type: Quincke  Needle gauge: 22 G Assessment Events: failed spinal Additional Notes Attempt x2 by CRNA.  Attempt x2 by MDA with 24ga Pencan and 22ga Quinke. +CSF return.  Patient with maintained motor function, elected to proceed with GA with LMA.  Hoy Morn, MD

## 2018-10-19 NOTE — Progress Notes (Signed)
AssistedDr. Turk with left, ultrasound guided, adductor canal block. Side rails up, monitors on throughout procedure. See vital signs in flow sheet. Tolerated Procedure well.  

## 2018-10-19 NOTE — Transfer of Care (Signed)
Immediate Anesthesia Transfer of Care Note  Patient: Cory [name]  Procedure(s) Performed: Procedure(s) with comments: LEFT TOTAL KNEE ARTHROPLASTY (Left) - with abductor block failed spinal  Patient Location: PACU  Anesthesia Type:Spinal  Level of Consciousness:  sedated, patient cooperative and responds to stimulation  Airway & Oxygen Therapy:Patient Spontanous Breathing and Patient connected to face mask oxgen  Post-op Assessment:  Report given to PACU RN and Post -op Vital signs reviewed and stable  Post vital signs:  Reviewed and stable  Last Vitals:  Vitals:   10/19/18 1048 10/19/18 1050  BP:    Pulse: (!) 56 (!) 48  Resp: 15 16  Temp:    SpO2: 938% 18%    Complications: No apparent anesthesia complications

## 2018-10-19 NOTE — Evaluation (Signed)
Physical Therapy Evaluation Patient Details Name: Cory [name] MRN: [id number] DOB: 03/13/1946 Today's Date: 10/19/2018   History of Present Illness  pt s/p LTKA on 10/19/2018.   Clinical Impression  Pt is s/p TKA resulting in the deficits listed below (see PT Problem List).  Pt will benefit from acute PT to increase their independence and safety with mobility to allow discharge home with spouse assisting .      Follow Up Recommendations Home health PT;Follow surgeon's recommendation for DC plan and follow-up therapies    Equipment Recommendations  Rolling walker with 5" wheels(pt states he has a standard walker with no wheels and would like to have a RW to progress gait )    Recommendations for Other Services       Precautions / Restrictions Precautions Precautions: Knee Precaution Booklet Issued: No Precaution Comments: educated with TKA precuations, no pillow or bend under knee for prolonged periods, and educated with postioning properly Restrictions Weight Bearing Restrictions: No      Mobility  Bed Mobility Overal bed mobility: Needs Assistance Bed Mobility: Supine to Sit;Sit to Supine     Supine to sit: Min assist     General bed mobility comments: assist with UB and LE and use of rail   Transfers Overall transfer level: Needs assistance Equipment used: Rolling walker (2 wheeled) Transfers: Sit to/from Stand Sit to Stand: Min assist         General transfer comment: cues for hand placement and safety for RW sit to stand and stand to sit for controlled movement   Ambulation/Gait Ambulation/Gait assistance: Min assist Gait Distance (Feet): 3 Feet Assistive device: Rolling walker (2 wheeled)       General Gait Details: more like a hop and a pivot. limited to more of a stand pivot to recliner due to muscle acitivty returning but pt with very little sensation returning during session   Stairs            Wheelchair Mobility    Modified Rankin  (Stroke Patients Only)       Balance Overall balance assessment: Mild deficits observed, not formally tested                                           Pertinent Vitals/Pain Pain Assessment: 0-10 Pain Score: 3  Pain Location: L knee Pain Descriptors / Indicators: Aching;Dull Pain Intervention(s): Monitored during session;RN gave pain meds during session;Ice applied    Home Living Family/patient expects to be discharged to:: Private residence Living Arrangements: Spouse/significant other Available Help at Discharge: Family Type of Home: House Home Access: Stairs to enter Entrance Stairs-Rails: None Entrance Stairs-Number of Steps: 1 Home Layout: One level Home Equipment: Environmental consultant - standard      Prior Function Level of Independence: Independent         Comments: just ws difficlut to do things lately due to pain      Hand Dominance        Extremity/Trunk Assessment        Lower Extremity Assessment Lower Extremity Assessment: LLE deficits/detail LLE Deficits / Details: 10-70 (educated a lot with knee extension improtance and postioning)       Communication   Communication: No difficulties  Cognition Arousal/Alertness: Awake/alert Behavior During Therapy: WFL for tasks assessed/performed Overall Cognitive Status: Within Functional Limits for tasks assessed  General Comments      Exercises Total Joint Exercises Ankle Circles/Pumps: AROM;Both;Supine;10 reps Quad Sets: AROM;10 reps;Both;Supine Heel Slides: AAROM;10 reps;Left;Supine Straight Leg Raises: Left;10 reps;AROM Goniometric ROM: 10-60 (eventually down to about 5 after knee extension exercises )    Assessment/Plan    PT Assessment    PT Problem List         PT Treatment Interventions      PT Goals (Current goals can be found in the Care Plan section)  Acute Rehab PT Goals Patient Stated Goal: to get back into his  yard  PT Goal Formulation: With patient Time For Goal Achievement: 11/02/18 Potential to Achieve Goals: Good    Frequency     Barriers to discharge        Co-evaluation               AM-PAC PT "6 Clicks" Daily Activity  Outcome Measure Difficulty turning over in bed (including adjusting bedclothes, sheets and blankets)?: Unable Difficulty moving from lying on back to sitting on the side of the bed? : Unable Difficulty sitting down on and standing up from a chair with arms (e.g., wheelchair, bedside commode, etc,.)?: Unable Help needed moving to and from a bed to chair (including a wheelchair)?: A Little Help needed walking in hospital room?: A Little Help needed climbing 3-5 steps with a railing? : A Little 6 Click Score: 12    End of Session Equipment Utilized During Treatment: Gait belt Activity Tolerance: Patient tolerated treatment well Patient left: in chair;with call bell/phone within reach;with chair alarm set;with family/visitor present Nurse Communication: Mobility status      Time: 3151-7616 PT Time Calculation (min) (ACUTE ONLY): 27 min   Charges:   PT Evaluation $PT Eval Low Complexity: 1 Low PT Treatments $Therapeutic Activity: 8-22 mins        Clide Dales, PT Acute Rehabilitation Services Pager: [phone] Office: [phone] 10/19/2018   Clide Dales 10/19/2018, 9:13 PM

## 2018-10-19 NOTE — Anesthesia Procedure Notes (Signed)
Anesthesia Regional Block: Adductor canal block   Pre-Anesthetic Checklist: ,, timeout performed, Correct Patient, Correct Site, Correct Laterality, Correct Procedure, Correct Position, site marked, Risks and benefits discussed,  Surgical consent,  Pre-op evaluation,  At surgeon's request and post-op pain management  Laterality: Left  Prep: chloraprep       Needles:  Injection technique: Single-shot  Needle Type: Echogenic Needle     Needle Length: 9cm  Needle Gauge: 21     Additional Needles:   Procedures:,,,, ultrasound used (permanent image in chart),,,,  Narrative:  Start time: 10/19/2018 10:30 AM End time: 10/19/2018 10:35 AM Injection made incrementally with aspirations every 5 mL.  Performed by: Personally  Anesthesiologist: Catalina Gravel, MD  Additional Notes: No pain on injection. No increased resistance to injection. Injection made in 5cc increments.  Good needle visualization.  Patient tolerated procedure well.

## 2018-10-19 NOTE — Anesthesia Preprocedure Evaluation (Addendum)
Anesthesia Evaluation  Patient identified by MRN, date of birth, ID band Patient awake    Reviewed: Allergy & Precautions, NPO status , Patient's Chart, lab work & pertinent test results, reviewed documented beta blocker date and time   Airway Mallampati: II  TM Distance: <3 FB Neck ROM: Full    Dental  (+) Teeth Intact, Dental Advisory Given, Caps   Pulmonary neg pulmonary ROS,    Pulmonary exam normal breath sounds clear to auscultation       Cardiovascular hypertension, Pt. on home beta blockers and Pt. on medications (-) angina+ CAD, + Past MI and + Cardiac Stents  Normal cardiovascular exam+ Valvular Problems/Murmurs MR  Rhythm:Regular Rate:Normal  Echo 05/18/18: Study Conclusions  - Left ventricle: The cavity size was normal. Wall thickness was increased in a pattern of mild LVH. Systolic function was mildly to moderately reduced. The estimated ejection fraction was in the range of 40% to 45%. There is akinesis of the inferolateral myocardium. There is hypokinesis of the inferior myocardium. Doppler parameters are consistent with abnormal left ventricular   relaxation (grade 1 diastolic dysfunction). - Ascending aorta: The ascending aorta was mildly dilated. - Mitral valve: There was mild regurgitation.   Neuro/Psych PSYCHIATRIC DISORDERS Anxiety negative neurological ROS     GI/Hepatic Neg liver ROS, GERD  Medicated,  Endo/Other  negative endocrine ROS  Renal/GU negative Renal ROS     Musculoskeletal  (+) Arthritis , Osteoarthritis,    Abdominal   Peds  Hematology negative hematology ROS (+) Plt 170k   Anesthesia Other Findings Day of surgery medications reviewed with the patient.  Reproductive/Obstetrics                            Anesthesia Physical Anesthesia Plan  ASA: III  Anesthesia Plan: Spinal   Post-op Pain Management:  Regional for Post-op pain   Induction:   PONV  Risk Score and Plan: 1 and Propofol infusion and Ondansetron  Airway Management Planned: Natural Airway and Simple Face Mask  Additional Equipment:   Intra-op Plan:   Post-operative Plan:   Informed Consent: I have reviewed the patients History and Physical, chart, labs and discussed the procedure including the risks, benefits and alternatives for the proposed anesthesia with the patient or authorized representative who has indicated his/her understanding and acceptance.   Dental advisory given  Plan Discussed with: CRNA, Anesthesiologist and Surgeon  Anesthesia Plan Comments:         Anesthesia Quick Evaluation

## 2018-10-19 NOTE — Anesthesia Procedure Notes (Signed)
Procedure Name: LMA Insertion Date/Time: 10/19/2018 11:26 AM Performed by: Lavina Hamman, CRNA Pre-anesthesia Checklist: Patient identified, Emergency Drugs available, Suction available, Patient being monitored and Timeout performed Patient Re-evaluated:Patient Re-evaluated prior to induction Oxygen Delivery Method: Circle system utilized Preoxygenation: Pre-oxygenation with 100% oxygen Induction Type: IV induction Ventilation: Mask ventilation without difficulty LMA: LMA inserted Tube size: 4.0 mm Number of attempts: 1 Placement Confirmation: positive ETCO2 and breath sounds checked- equal and bilateral Tube secured with: Tape Dental Injury: Teeth and Oropharynx as per pre-operative assessment

## 2018-10-19 NOTE — H&P (Signed)
Cory [name] MRN:  [id number] DOB/SEX:  Sep 27, 1946/male  CHIEF COMPLAINT:  Painful left Knee  HISTORY: Patient is a 72 y.o. male presented with a history of pain in the left knee. Onset of symptoms was gradual starting a few years ago with gradually worsening course since that time. Patient has been treated conservatively with over-the-counter NSAIDs and activity modification. Patient currently rates pain in the knee at 10 out of 10 with activity. There is pain at night.  PAST MEDICAL HISTORY: Patient Active Problem List   Diagnosis Date Noted  . S/P left knee arthroscopy 02/05/18 02/12/2018  . Ischemic cardiomyopathy 11/18/2017  . Acute pain of left knee 01/24/2017  . NSTEMI (non-ST elevated myocardial infarction) ([place]) 01/08/2017  . ST elevation myocardial infarction (STEMI) of inferior wall, subsequent episode of care ([place]) 01/08/2017  . Mixed hyperlipidemia 01/08/2017  . Sinus bradycardia 01/08/2017  . Left tennis elbow 11/08/2013  . Stable angina ([place]) 11/08/2013  . CAD in native artery 11/08/2013  . PTSD (post-traumatic stress disorder) 11/08/2013  . Essential hypertension 11/08/2013  . Dyslipidemia 11/08/2013  . Fracture of phalanx of thumb 12/03/2011   Past Medical History:  Diagnosis Date  . Anemia   . Arthritis   . Coronary artery disease    a. 09/2001 PCI/BMS to D1: 2.5 x 15 Express 2 BMS;  b. 2011 Cath: stable anatomy; c. 03/2014 Low risk MV, EF 54%;  d. 09/2016 Inf STEMI/PCI [facility]): LM nl, LAD 75m D1 nl w/ 90% in 12minf branch, LCX nl, RCA 99 (Synergy DES x 4 - 3.5x28 prox, 3.5x3824m.5x16d, 2.25x16 to RPDA), EF 55%.  . GMarland KitchenRD (gastroesophageal reflux disease)   . Hyperlipidemia   . Hypertensive heart disease   . Myocardial infarction ([place]  . Pneumonia   . PTSD (post-traumatic stress disorder)    Past Surgical History:  Procedure Laterality Date  . CARDIAC CATHETERIZATION  05/17/2010   left main free of significant lesion; LAD with 60%  lesion in mid segment & 60-70% lesion in mid-distal segment with subtotal occlusion at apex & small side branch of diagonal 1 that is subtotally occluded; ramus intermedius with 90% ostial stenosis; L Cfx w/mild luminal irreg; RCA is dominant vessel with diffuse disease, 50% prox lesion & 60% lesion in mid segment & 60% lesion in PDA  . CATARACT EXTRACTION W/PHACO Right 06/07/2015   Procedure: CATARACT EXTRACTION PHACO AND INTRAOCULAR LENS PLACEMENT (IOC);  Surgeon: RoyMarylynn PearsonD;  [facility];  Laterality: Right;  . COLONOSCOPY  02/25/2012   Procedure: COLONOSCOPY;  Surgeon: MarJamesetta SoD;  [facility];  Service: Gastroenterology;  Laterality: N/A;  . CORONARY ANGIOPLASTY WITH STENT PLACEMENT  09/2001   [place] (2.5x15m80mtent to 1st diagonal   . ESOPHAGEAL DILATION N/A 08/14/2016   Procedure: ESOPHAGEAL DILATION;  Surgeon: NajeRogene Houston;  [facility];  Service: Endoscopy;  Laterality: N/A;  . ESOPHAGOGASTRODUODENOSCOPY  02/25/2012   Procedure: ESOPHAGOGASTRODUODENOSCOPY (EGD);  Surgeon: MarkJamesetta So;  [facility];  Service: Gastroenterology;  Laterality: N/A;  . ESOPHAGOGASTRODUODENOSCOPY N/A 07/28/2014   Procedure: ESOPHAGOGASTRODUODENOSCOPY (EGD);  Surgeon: NajeRogene Houston;  [facility];  Service: Endoscopy;  Laterality: N/A;  200-rescheduled to [place] pt  . ESOPHAGOGASTRODUODENOSCOPY N/A 08/14/2016   Procedure: ESOPHAGOGASTRODUODENOSCOPY (EGD);  Surgeon: NajeRogene Houston;  [facility];  Service: Endoscopy;  Laterality: N/A;  100  . EYE SURGERY Bilateral  lasik surgery   . KNEE ARTHROSCOPY WITH MEDIAL MENISECTOMY Left 02/05/2018   Procedure: LEFT KNEE ARTHROSCOPY WITH PARTIAL MEDIAL MENISECTOMY;  Surgeon: Carole Civil, MD;  Location: [facility];  Service: Orthopedics;  Laterality: Left;  . KNEE SURGERY    . LATERAL EPICONDYLE RELEASE Left 01/21/2014   Procedure: Left Tennis  Elbow Release with debridement  tendon repair with reattachment;  Surgeon: Carole Civil, MD;  Location: [facility];  Service: Orthopedics;  Laterality: Left;  . penile implant    . PROSTATE SURGERY    . SHOULDER SURGERY    . TONSILLECTOMY    . TRANSTHORACIC ECHOCARDIOGRAM  03/2010   EF=>55%; LV mildly dilated; LA mod dilated; mild MR & TR; normal RVSP; mild pulm valve regurg     MEDICATIONS:   Medications Prior to Admission  Medication Sig Dispense Refill Last Dose  . acetaminophen (TYLENOL) 325 MG tablet Take 650 mg by mouth daily as needed for moderate pain or headache.   Past Week at Unknown time  . aspirin 81 MG tablet Take 81 mg by mouth every evening.    Past Week at Unknown time  . atorvastatin (LIPITOR) 80 MG tablet Take 1 tablet (80 mg total) by mouth daily. (Patient taking differently: Take 80 mg by mouth every evening. ) 90 tablet 3 Taking  . buPROPion (WELLBUTRIN SR) 100 MG 12 hr tablet Take 100 mg by mouth daily.   10/19/2018 at Unknown time  . carvedilol (COREG) 3.125 MG tablet Take 1 tablet (3.125 mg total) by mouth 2 (two) times daily. 180 tablet 3 10/19/2018 at Unknown time  . fluticasone (FLONASE) 50 MCG/ACT nasal spray Place 1 spray into both nostrils daily as needed for allergies or rhinitis.   Taking  . gabapentin (NEURONTIN) 300 MG capsule Take 300 mg by mouth 2 (two) times daily.   10/19/2018 at Unknown time  . irbesartan (AVAPRO) 300 MG tablet Take 1 tablet (300 mg total) daily by mouth. 90 tablet 3 10/18/2018 at Unknown time  . loratadine (CLARITIN) 10 MG tablet Take 10 mg by mouth daily.    Taking  . Magnesium Malate 1250 (141.7 Mg) MG TABS Take 1,250 mg by mouth daily.   Taking  . pantoprazole (PROTONIX) 40 MG tablet Take 1 tablet (40 mg total) by mouth 2 (two) times daily before a meal. 180 tablet 1 10/19/2018 at Unknown time  . traZODone (DESYREL) 100 MG tablet Take 100 mg by mouth at bedtime.    Taking  . venlafaxine (EFFEXOR) 75 MG tablet Take 75 mg by mouth  daily.   10/19/2018 at Unknown time    ALLERGIES:   Allergies  Allergen Reactions  . Ace Inhibitors Other (See Comments)    unknown  . Benadryl [Diphenhydramine Hcl] Other (See Comments)    Opposite reaction of medication purpose - hyper  . Vioxx [Rofecoxib] Other (See Comments)    headache    REVIEW OF SYSTEMS:  A comprehensive review of systems was negative except for: Musculoskeletal: positive for arthralgias and bone pain   FAMILY HISTORY:   Family History  Problem Relation Age of Onset  . Colitis Maternal Aunt     SOCIAL HISTORY:   Social History   Tobacco Use  . Smoking status: Never Smoker  . Smokeless tobacco: Never Used  Substance Use Topics  . Alcohol use: No     EXAMINATION:  Vital signs in last 24 hours: Temp:  [97.7 F (36.5 C)] 97.7 F (36.5 C) (10/21 0854) Pulse Rate:  [  62] 62 (10/21 0854) Resp:  [16] 16 (10/21 0854) BP: (146)/(96) 146/96 (10/21 0854) SpO2:  [99 %] 99 % (10/21 0854)  BP (!) 146/96 (BP Location: Right Arm)   Pulse 62   Temp 97.7 F (36.5 C) (Oral)   Resp 16   SpO2 99%   General Appearance:    Alert, cooperative, no distress, appears stated age  Head:    Normocephalic, without obvious abnormality, atraumatic  Eyes:    PERRL, conjunctiva/corneas clear, EOM's intact, fundi    benign, both eyes       Ears:    Normal TM's and external ear canals, both ears  Nose:   Nares normal, septum midline, mucosa normal, no drainage    or sinus tenderness  Throat:   Lips, mucosa, and tongue normal; teeth and gums normal  Neck:   Supple, symmetrical, trachea midline, no adenopathy;       thyroid:  No enlargement/tenderness/nodules; no carotid   bruit or JVD  Back:     Symmetric, no curvature, ROM normal, no CVA tenderness  Lungs:     Clear to auscultation bilaterally, respirations unlabored  Chest wall:    No tenderness or deformity  Heart:    Regular rate and rhythm, S1 and S2 normal, no murmur, rub   or gallop  Abdomen:     Soft,  non-tender, bowel sounds active all four quadrants,    no masses, no organomegaly  Genitalia:    Normal male without lesion, discharge or tenderness  Rectal:    Normal tone, normal prostate, no masses or tenderness;   guaiac negative stool  Extremities:   Extremities normal, atraumatic, no cyanosis or edema  Pulses:   2+ and symmetric all extremities  Skin:   Skin color, texture, turgor normal, no rashes or lesions  Lymph nodes:   Cervical, supraclavicular, and axillary nodes normal  Neurologic:   CNII-XII intact. Normal strength, sensation and reflexes      throughout    Musculoskeletal:  ROM 0-120, Ligaments intact,  Imaging Review Plain radiographs demonstrate severe degenerative joint disease of the left knee. The overall alignment is neutral. The bone quality appears to be good for age and reported activity level.  Assessment/Plan: Primary osteoarthritis, left knee   The patient history, physical examination and imaging studies are consistent with advanced degenerative joint disease of the left knee. The patient has failed conservative treatment.  The clearance notes were reviewed.  After discussion with the patient it was felt that Total Knee Replacement was indicated. The procedure,  risks, and benefits of total knee arthroplasty were presented and reviewed. The risks including but not limited to aseptic loosening, infection, blood clots, vascular injury, stiffness, patella tracking problems complications among others were discussed. The patient acknowledged the explanation, agreed to proceed with the plan.  Preoperative templating of the joint replacement has been completed, documented, and submitted to the Operating Room personnel in order to optimize intra-operative equipment management.    Patient's anticipated LOS is less than 2 midnights, meeting these requirements: - Lives within 1 hour of care - Has a competent adult at home to recover with post-op recover - NO history  of  - Chronic pain requiring opiods  - Diabetes  - Coronary Artery Disease  - Heart failure  - Heart attack  - Stroke  - DVT/VTE  - Cardiac arrhythmia  - Respiratory Failure/COPD  - Renal failure  - Anemia  - Advanced Liver disease       Donia Ast  10/19/2018, 9:13 AM

## 2018-10-20 ENCOUNTER — Encounter (HOSPITAL_COMMUNITY): Payer: Self-pay | Admitting: Orthopedic Surgery

## 2018-10-20 DIAGNOSIS — M1712 Unilateral primary osteoarthritis, left knee: Secondary | ICD-10-CM | POA: Diagnosis not present

## 2018-10-20 LAB — CBC
HCT: 35.2 % — ABNORMAL LOW (ref 39.0–52.0)
Hemoglobin: 11.4 g/dL — ABNORMAL LOW (ref 13.0–17.0)
MCH: 30 pg (ref 26.0–34.0)
MCHC: 32.4 g/dL (ref 30.0–36.0)
MCV: 92.6 fL (ref 80.0–100.0)
Platelets: 157 10*3/uL (ref 150–400)
RBC: 3.8 MIL/uL — ABNORMAL LOW (ref 4.22–5.81)
RDW: 12.5 % (ref 11.5–15.5)
WBC: 10.7 10*3/uL — ABNORMAL HIGH (ref 4.0–10.5)
nRBC: 0 % (ref 0.0–0.2)

## 2018-10-20 LAB — BASIC METABOLIC PANEL
Anion gap: 6 (ref 5–15)
BUN: 14 mg/dL (ref 8–23)
CO2: 27 mmol/L (ref 22–32)
Calcium: 8.7 mg/dL — ABNORMAL LOW (ref 8.9–10.3)
Chloride: 105 mmol/L (ref 98–111)
Creatinine, Ser: 0.86 mg/dL (ref 0.61–1.24)
GFR calc Af Amer: >60 mL/min (ref >60)
GFR calc non Af Amer: >60 mL/min (ref >60)
Glucose, Bld: 145 mg/dL — ABNORMAL HIGH (ref 70–99)
Potassium: 4.6 mmol/L (ref 3.5–5.1)
Sodium: 138 mmol/L (ref 135–145)

## 2018-10-20 MED ORDER — METHOCARBAMOL 500 MG PO TABS: 500.0000 mg | ORAL_TABLET | Freq: Four times a day (QID) | ORAL | 0 refills | Status: DC | PRN

## 2018-10-20 MED ORDER — OXYCODONE HCL 5 MG PO TABS: 5.0000 mg | ORAL_TABLET | Freq: Four times a day (QID) | ORAL | 0 refills | Status: DC | PRN

## 2018-10-20 MED ORDER — ASPIRIN 325 MG PO TBEC: 325.0000 mg | DELAYED_RELEASE_TABLET | Freq: Two times a day (BID) | ORAL | 0 refills | Status: DC

## 2018-10-20 NOTE — Care Management Note (Signed)
Case Management Note  Patient Details  Name: Cory [name] MRN: [id number] Date of Birth: 04-06-1946  Subjective/Objective:   Discharge planning, spoke with patient at bedside. Have chosen [facility] for [facility] PT, evaluate and treat.                  Action/Plan: Contacted [facility] for referral. Needs RW, contacted [facility] to deliver to room. [phone]  Expected Discharge Date:  10/20/18               Expected Discharge Plan:  [place]  In-House Referral:  NA  Discharge planning Services  CM Consult  Post Acute Care Choice:  Durable Medical Equipment, Home Health Choice offered to:  Patient  DME Arranged:  [place] rolling DME Agency:  [place]:  PT [place] Agency:  [facility] (formerly [facility])  Status of Service:  Completed, signed off  If discussed at [employer] of Stay Meetings, dates discussed:    Additional Comments:  Guadalupe Maple, RN 10/20/2018, 3:01 PM

## 2018-10-20 NOTE — Plan of Care (Signed)
Patient discharged home in stable condition 

## 2018-10-20 NOTE — Op Note (Signed)
TOTAL KNEE REPLACEMENT OPERATIVE NOTE:  10/19/2018  2:50 PM  PATIENT:  Cory [name]  72 y.o. male  PRE-OPERATIVE DIAGNOSIS:  primary osteoarthritis left knee  POST-OPERATIVE DIAGNOSIS:  primary osteoarthritis left knee  PROCEDURE:  Procedure(s): LEFT TOTAL KNEE ARTHROPLASTY  SURGEON:  Surgeon(s): Vickey Huger, MD  PHYSICIAN ASSISTANT:  Nehemiah Massed, PA-C  ANESTHESIA:   spinal  SPECIMEN: None  COUNTS:  Correct  TOURNIQUET:   Total Tourniquet Time Documented: Thigh (Left) - 43 minutes Total: Thigh (Left) - 43 minutes   DICTATION:  Indication for procedure:    The patient is a 72 y.o. male who has failed conservative treatment for primary osteoarthritis left knee.  Informed consent was obtained prior to anesthesia. The risks versus benefits of the operation were explain and in a way the patient can, and did, understand.   On the implant demand matching protocol, this patient scored 10.  Therefore, this patient was not receive a polyethylene insert with vitamin E which is a high demand implant.  Description of procedure:     The patient was taken to the operating room and placed under anesthesia.  The patient was positioned in the usual fashion taking care that all body parts were adequately padded and/or protected.  A tourniquet was applied and the leg prepped and draped in the usual sterile fashion.  The extremity was exsanguinated with the esmarch and tourniquet inflated to 350 mmHg.  Pre-operative range of motion was normal.  The knee was in 5 degree of mild varus.  A midline incision approximately 6-7 inches long was made with a #10 blade.  A new blade was used to make a parapatellar arthrotomy going 2-3 cm into the quadriceps tendon, over the patella, and alongside the medial aspect of the patellar tendon.  A synovectomy was then performed with the #10 blade and forceps. I then elevated the deep MCL off the medial tibial metaphysis subperiosteally around to the  semimembranosus attachment.    I everted the patella and used calipers to measure patellar thickness.  I used the reamer to ream down to appropriate thickness to recreate the native thickness.  I then removed excess bone with the rongeur and sagittal saw.  I used the appropriately sized template and drilled the three lug holes.  I then put the trial in place and measured the thickness with the calipers to ensure recreation of the native thickness.  The trial was then removed and the patella subluxed and the knee brought into flexion.  A homan retractor was place to retract and protect the patella and lateral structures.  A Z-retractor was place medially to protect the medial structures.  The extra-medullary alignment system was used to make cut the tibial articular surface perpendicular to the anamotic axis of the tibia and in 3 degrees of posterior slope.  The cut surface and alignment jig was removed.  I then used the intramedullary alignment guide to make a 6 valgus cut on the distal femur.  I then marked out the epicondylar axis on the distal femur.  The posterior condylar axis measured 3 degrees.  I then used the anterior referencing sizer and measured the femur to be a size 9.  The 4-In-1 cutting block was screwed into place in external rotation matching the posterior condylar angle, making our cuts perpendicular to the epicondylar axis.  Anterior, posterior and chamfer cuts were made with the sagittal saw.  The cutting block and cut pieces were removed.  A lamina spreader was placed in  90 degrees of flexion.  The ACL, PCL, menisci, and posterior condylar osteophytes were removed.  A 11 mm spacer blocked was found to offer good flexion and extension gap balance after mild in degree releasing.   The scoop retractor was then placed and the femoral finishing block was pinned in place.  The small sagittal saw was used as well as the lug drill to finish the femur.  The block and cut surfaces were removed  and the medullary canal hole filled with autograft bone from the cut pieces.  The tibia was delivered forward in deep flexion and external rotation.  A size F tray was selected and pinned into place centered on the medial 1/3 of the tibial tubercle.  The reamer and keel was used to prepare the tibia through the tray.    I then trialed with the size 9 femur, size F tibia, a 11 mm insert and the 35 patella.  I had excellent flexion/extension gap balance, excellent patella tracking.  Flexion was full and beyond 120 degrees; extension was zero.  These components were chosen and the staff opened them to me on the back table while the knee was lavaged copiously and the cement mixed.  The soft tissue was infiltrated with 60cc of exparel 1.3% through a 21 gauge needle.  I cemented in the components and removed all excess cement.  The polyethylene tibial component was snapped into place and the knee placed in extension while cement was hardening.  The capsule was infilltrated with a 60cc exparel/marcaine/saline mixture.   Once the cement was hard, the tourniquet was let down.  Hemostasis was obtained.  The arthrotomy was closed using a #1 stratofix running suture.  The deep soft tissues were closed with #0 vicryls and the subcuticular layer closed with #2-0 vicryl.  The skin was reapproximated and closed with 3.0 Monocryl.  The wound was covered with steristrips, aquacel dressing, and a TED stocking.   The patient was then awakened, extubated, and taken to the recovery room in stable condition.  BLOOD LOSS:  0000000 COMPLICATIONS:  None.  PLAN OF CARE: Admit for overnight observation  PATIENT DISPOSITION:  PACU - hemodynamically stable.   Delay start of Pharmacological VTE agent (>24hrs) due to surgical blood loss or risk of bleeding:  not applicable  Please fax a copy of this op note to my office at [phone] (please only include page 1 and 2 of the Case Information op note)

## 2018-10-20 NOTE — Plan of Care (Signed)
Plan of care reviewed and discussed with patient.   

## 2018-10-20 NOTE — Progress Notes (Signed)
SPORTS MEDICINE AND JOINT REPLACEMENT  Cory Mulch, MD    Carlyon Shadow, PA-C [place], [place], [place]  [address]                             [phone]   PROGRESS NOTE  Subjective:  negative for Chest Pain  negative for Shortness of Breath  negative for Nausea/Vomiting   negative for Calf Pain  negative for Bowel Movement   Tolerating Diet: yes         Patient reports pain as 3 on 0-10 scale.    Objective: Vital signs in last 24 hours:    Patient Vitals for the past 24 hrs:  BP Temp Temp src Pulse Resp SpO2 Height Weight  10/20/18 0518 116/81 97.8 F (36.6 C) Oral 60 17 98 % -- --  10/20/18 0136 125/79 97.6 F (36.4 C) Oral 82 18 96 % -- --  10/19/18 2031 132/86 (!) 97.5 F (36.4 C) Oral 88 17 99 % -- --  10/19/18 1930 -- -- -- 80 18 -- -- --  10/19/18 1755 137/86 -- -- 79 18 99 % -- --  10/19/18 1613 (!) 149/88 (!) 97.4 F (36.3 C) Oral 75 18 98 % -- --  10/19/18 1507 (!) 155/84 -- -- 66 16 98 % -- --  10/19/18 1407 (!) 140/95 -- -- 72 16 100 % -- --  10/19/18 1355 (!) 135/95 -- -- 74 13 99 % -- --  10/19/18 1345 133/75 (!) 97.1 F (36.2 C) -- 62 14 98 % -- --  10/19/18 1330 (!) 151/79 -- -- 71 16 100 % -- --  10/19/18 1315 (!) 112/98 (!) 97.3 F (36.3 C) -- 72 15 100 % -- --  10/19/18 1050 -- -- -- (!) 48 16 99 % -- --  10/19/18 1048 -- -- -- (!) 56 15 100 % -- --  10/19/18 1046 132/89 -- -- (!) 53 15 99 % -- --  10/19/18 1044 -- -- -- (!) 51 12 100 % -- --  10/19/18 1042 -- -- -- (!) 51 18 99 % -- --  10/19/18 1040 -- -- -- (!) 48 17 98 % -- --  10/19/18 1038 -- -- -- (!) 54 11 100 % -- --  10/19/18 1037 -- -- -- (!) 54 15 100 % -- --  10/19/18 1036 (!) 154/88 -- -- (!) 48 12 99 % -- --  10/19/18 1035 -- -- -- (!) 51 20 99 % -- --  10/19/18 1034 -- -- -- (!) 51 17 100 % -- --  10/19/18 1033 -- -- -- (!) 47 (!) 22 100 % -- --  10/19/18 1032 -- -- -- (!) 49 (!) 22 99 % -- --  10/19/18 1031 -- -- -- (!) 57 (!) 24 100 % -- --  10/19/18 0919 --  -- -- -- -- -- '5\' 9"'$  (1.753 m) 81.4 kg  10/19/18 0854 (!) 146/96 97.7 F (36.5 C) Oral 62 16 99 % -- --    '@flow'$ {1959:LAST@   Intake/Output from previous day:   10/21 0701 - 10/22 0700 In: 3691.5 [P.O.:810; I.V.:2681.5] Out: 3000 [Urine:2950]   Intake/Output this shift:   No intake/output data recorded.   Intake/Output      10/21 0701 - 10/22 0700 10/22 0701 - 10/23 0700   P.O. 810    I.V. (mL/kg) 2681.5 (32.9)    IV Piggyback 200    Total Intake(mL/kg)  3691.5 (45.3)    Urine (mL/kg/hr) 2950    Blood 50    Total Output 3000    Net +691.5            LABORATORY DATA: Recent Labs    10/20/18 0431  WBC 10.7*  HGB 11.4*  HCT 35.2*  PLT 157   Recent Labs    10/20/18 0431  NA 138  K 4.6  CL 105  CO2 27  BUN 14  CREATININE 0.86  GLUCOSE 145*  CALCIUM 8.7*   Lab Results  Component Value Date   INR 1.00 01/29/2018   INR 1.07 11/10/2011    Examination:  General appearance: alert, cooperative and no distress Extremities: extremities normal, atraumatic, no cyanosis or edema  Wound Exam: clean, dry, intact   Drainage:  None: wound tissue dry  Motor Exam: Quadriceps and Hamstrings Intact  Sensory Exam: Superficial Peroneal, Deep Peroneal and Tibial normal   Assessment:    1 Day Post-Op  Procedure(s) (LRB): LEFT TOTAL KNEE ARTHROPLASTY (Left)  ADDITIONAL DIAGNOSIS:  Active Problems:   S/P total knee replacement     Plan: Physical Therapy as ordered Weight Bearing as Tolerated (WBAT)  DVT Prophylaxis:  Aspirin  DISCHARGE PLAN: Home  DISCHARGE NEEDS: HHPT       Patient's anticipated LOS is less than 2 midnights, meeting these requirements:  - Lives within 1 hour of care - Has a competent adult at home to recover with post-op recover - NO history of  - Chronic pain requiring opiods  - Diabetes  - Coronary Artery Disease  - Heart failure  - Heart attack  - Stroke  - DVT/VTE  - Cardiac arrhythmia  - Respiratory Failure/COPD  - Renal  failure  - Anemia  - Advanced Liver disease   Patient doing well, expected D/C home today     Donia Ast 10/20/2018, 7:13 AM

## 2018-10-20 NOTE — Anesthesia Postprocedure Evaluation (Signed)
Anesthesia Post Note  Patient: Cory [name]  Procedure(s) Performed: LEFT TOTAL KNEE ARTHROPLASTY (Left Knee)     Patient location during evaluation: PACU Anesthesia Type: General Level of consciousness: awake and alert, awake and oriented Pain management: pain level controlled Vital Signs Assessment: post-procedure vital signs reviewed and stable Respiratory status: spontaneous breathing, nonlabored ventilation and respiratory function stable Cardiovascular status: blood pressure returned to baseline and stable Postop Assessment: no apparent nausea or vomiting Anesthetic complications: no    Last Vitals:  Vitals:   10/20/18 0136 10/20/18 0518  BP: 125/79 116/81  Pulse: 82 60  Resp: 18 17  Temp: 36.4 C 36.6 C  SpO2: 96% 98%    Last Pain:  Vitals:   10/20/18 0640  TempSrc:   PainSc: 0-No pain                 Catalina Gravel

## 2018-10-20 NOTE — Progress Notes (Signed)
Physical Therapy Treatment Patient Details Name: Cory [name] MRN: [id number] DOB: 01/18/1946 Today's Date: 10/20/2018    History of Present Illness pt s/p LTKA on 10/19/2018.     PT Comments    Progressing well with mobility. Pt politely requested a 2nd session to ambulate again prior to d/c later today.    Follow Up Recommendations  Follow surgeon's recommendation for DC plan and follow-up therapies     Equipment Recommendations  Rolling walker with 5" wheels    Recommendations for Other Services       Precautions / Restrictions Precautions Precautions: Knee Precaution Comments: educated with TKA precuations, no pillow or bend under knee for prolonged periods, and educated with postioning properly Restrictions Weight Bearing Restrictions: No Other Position/Activity Restrictions: WBAT    Mobility  Bed Mobility Overal bed mobility: Modified Independent                Transfers Overall transfer level: Needs assistance Equipment used: Rolling walker (2 wheeled) Transfers: Sit to/from Stand Sit to Stand: Supervision         General transfer comment: VCS safety, hand placement.   Ambulation/Gait Ambulation/Gait assistance: Min guard Gait Distance (Feet): 200 Feet Assistive device: Rolling walker (2 wheeled) Gait Pattern/deviations: Step-to pattern;Step-through pattern;Decreased stride length     General Gait Details: close guard for safety. Pt denied feeling as if knee would buckle. Noted some increased knee flexion during stance. Cues for safety, sequence, technique.    Stairs Stairs: Yes Stairs assistance: Min guard Stair Management: Step to pattern;Forwards;With walker Number of Stairs: 1 General stair comments: VCs safety, technique, sequence. Close guard for safety.    Wheelchair Mobility    Modified Rankin (Stroke Patients Only)       Balance Overall balance assessment: Mild deficits observed, not formally tested                                          Cognition Arousal/Alertness: Awake/alert Behavior During Therapy: WFL for tasks assessed/performed Overall Cognitive Status: Within Functional Limits for tasks assessed                                        Exercises Total Joint Exercises Ankle Circles/Pumps: AROM;Both;10 reps;Supine Quad Sets: AROM;Both;10 reps;Supine Hip ABduction/ADduction: AROM;Left;10 reps;Supine Straight Leg Raises: AROM;Left;10 reps;Supine Knee Flexion: AROM;Left;10 reps;Seated Goniometric ROM: ~5-90 degrees    General Comments        Pertinent Vitals/Pain Pain Assessment: 0-10 Pain Score: 3  Pain Location: L knee Pain Descriptors / Indicators: Sore Pain Intervention(s): Monitored during session;Repositioned;Ice applied    Home Living                      Prior Function            PT Goals (current goals can now be found in the care plan section) Progress towards PT goals: Progressing toward goals    Frequency    7X/week      PT Plan Current plan remains appropriate    Co-evaluation              AM-PAC PT "6 Clicks" Daily Activity  Outcome Measure  Difficulty turning over in bed (including adjusting bedclothes, sheets and blankets)?: None Difficulty moving from lying on back to sitting on the  side of the bed? : None Difficulty sitting down on and standing up from a chair with arms (e.g., wheelchair, bedside commode, etc,.)?: None Help needed moving to and from a bed to chair (including a wheelchair)?: A Little Help needed walking in hospital room?: A Little Help needed climbing 3-5 steps with a railing? : A Little 6 Click Score: 21    End of Session Equipment Utilized During Treatment: Gait belt Activity Tolerance: Patient tolerated treatment well Patient left: in bed;with call bell/phone within reach   PT Visit Diagnosis: Pain;Difficulty in walking, not elsewhere classified (R26.2);Other abnormalities of  gait and mobility (R26.89) Pain - Right/Left: Left Pain - part of body: Knee     Time: 8242-3536 PT Time Calculation (min) (ACUTE ONLY): 20 min  Charges:  $Gait Training: 8-22 mins                        Weston Anna, [place] Pager: [phone] Office: [phone]

## 2018-10-20 NOTE — Progress Notes (Addendum)
Physical Therapy Treatment Patient Details Name: KWEKU WOLFENBARGER MRN: [id number] DOB: 1946-06-14 Today's Date: 10/20/2018    History of Present Illness pt s/p LTKA on 10/19/2018.     PT Comments    Progressing well with mobility. All education completed. Okay to d/c from PT standpoint-made RN aware.  Follow Up Recommendations  Follow surgeon's recommendation for DC plan and follow-up therapies     Equipment Recommendations  Rolling walker with 5" wheels    Recommendations for Other Services       Precautions / Restrictions Precautions Precautions: Knee Precaution Comments: educated with TKA precuations, no pillow or bend under knee for prolonged periods, and educated with postioning properly Restrictions Weight Bearing Restrictions: No Other Position/Activity Restrictions: WBAT    Mobility  Bed Mobility Overal bed mobility: Modified Independent                Transfers Overall transfer level: Needs assistance Equipment used: Rolling walker (2 wheeled) Transfers: Sit to/from Stand Sit to Stand: Supervision         General transfer comment: VCS safety, hand placement.   Ambulation/Gait Ambulation/Gait assistance: Supervision Gait Distance (Feet): 300 Feet Assistive device: Rolling walker (2 wheeled) Gait Pattern/deviations: Step-to pattern;Step-through pattern;Decreased stride length     General Gait Details:  for safety. Pt denied feeling as if knee would buckle. Noted some increased knee flexion during stance. Cues for safety, sequence, technique.    Stairs Stairs: Yes Stairs assistance: Min guard Stair Management: Step to pattern;Forwards;With walker Number of Stairs: 1 General stair comments: VCs safety, technique, sequence. Close guard for safety.    Wheelchair Mobility    Modified Rankin (Stroke Patients Only)       Balance Overall balance assessment: Mild deficits observed, not formally tested                                           Cognition Arousal/Alertness: Awake/alert Behavior During Therapy: WFL for tasks assessed/performed Overall Cognitive Status: Within Functional Limits for tasks assessed                                        Exercises   General Comments        Pertinent Vitals/Pain Pain Assessment: 0-10 Pain Score: 4  Pain Location: L knee Pain Descriptors / Indicators: Sore Pain Intervention(s): Monitored during session;Repositioned;Ice applied    Home Living                      Prior Function            PT Goals (current goals can now be found in the care plan section) Progress towards PT goals: Progressing toward goals    Frequency    7X/week      PT Plan Current plan remains appropriate    Co-evaluation              AM-PAC PT "6 Clicks" Daily Activity  Outcome Measure  Difficulty turning over in bed (including adjusting bedclothes, sheets and blankets)?: None Difficulty moving from lying on back to sitting on the side of the bed? : None Difficulty sitting down on and standing up from a chair with arms (e.g., wheelchair, bedside commode, etc,.)?: None Help needed moving to and from a bed to chair (including  a wheelchair)?: None Help needed walking in hospital room?: A Little Help needed climbing 3-5 steps with a railing? : A Little 6 Click Score: 22    End of Session Equipment Utilized During Treatment: Gait belt Activity Tolerance: Patient tolerated treatment well Patient left: in bed;with call bell/phone within reach   PT Visit Diagnosis: Pain;Difficulty in walking, not elsewhere classified (R26.2);Other abnormalities of gait and mobility (R26.89) Pain - Right/Left: Left Pain - part of body: Knee     Time: [id number] PT Time Calculation (min) (ACUTE ONLY): 11 min  Charges:  $Gait Training: 8-22 mins                        Weston Anna, [place] Pager: [phone] Office:  [phone]

## 2018-10-20 NOTE — Discharge Summary (Signed)
SPORTS MEDICINE & JOINT REPLACEMENT   Lara Mulch, MD   Carlyon Shadow, PA-C [place], [place], [place]  [address]                             [phone]  PATIENT ID: Cory [name]        MRN:  [id number]          DOB/AGE: 1946/10/25 / 72 y.o.    DISCHARGE SUMMARY  ADMISSION DATE:    10/19/2018 DISCHARGE DATE:   10/20/2018   ADMISSION DIAGNOSIS: primary osteoarthritis left knee    DISCHARGE DIAGNOSIS:  primary osteoarthritis left knee    ADDITIONAL DIAGNOSIS: Active Problems:   S/P total knee replacement  Past Medical History:  Diagnosis Date  . Anemia   . Arthritis   . Coronary artery disease    a. 09/2001 PCI/BMS to D1: 2.5 x 15 Express 2 BMS;  b. 2011 Cath: stable anatomy; c. 03/2014 Low risk MV, EF 54%;  d. 09/2016 Inf STEMI/PCI [facility]): LM nl, LAD 56m, D1 nl w/ 90% in 82mm inf branch, LCX nl, RCA 99 (Synergy DES x 4 - 3.5x28 prox, 3.5x36m, 2.5x16d, 2.25x16 to RPDA), EF 55%.  Marland Kitchen GERD (gastroesophageal reflux disease)   . Hyperlipidemia   . Hypertensive heart disease   . Myocardial infarction ([place]) 2017  . Pneumonia   . PTSD (post-traumatic stress disorder)     PROCEDURE: Procedure(s): LEFT TOTAL KNEE ARTHROPLASTY on 10/19/2018  CONSULTS:    HISTORY:  See H&P in chart  HOSPITAL COURSE:  Cory [name] is a 72 y.o. admitted on 10/19/2018 and found to have a diagnosis of primary osteoarthritis left knee.  After appropriate laboratory studies were obtained  they were taken to the operating room on 10/19/2018 and underwent Procedure(s): LEFT TOTAL KNEE ARTHROPLASTY.   They were given perioperative antibiotics:  Anti-infectives (From admission, onward)   Start     Dose/Rate Route Frequency Ordered Stop   10/19/18 1730  ceFAZolin (ANCEF) IVPB 2g/100 mL premix     2 g 200 mL/hr over 30 Minutes Intravenous Every 6 hours 10/19/18 1417 10/20/18 0056   10/19/18 0900  ceFAZolin (ANCEF) IVPB 2g/100 mL premix     2 g 200 mL/hr over 30  Minutes Intravenous On call to O.R. 10/19/18 8502 10/19/18 1120    .  Patient given tranexamic acid IV or topical and exparel intra-operatively.  Tolerated the procedure well.    POD# 1: Vital signs were stable.  Patient denied Chest pain, shortness of breath, or calf pain.  Patient was started on Aspirin twice daily at 8am.  Consults to PT, OT, and care management were made.  The patient was weight bearing as tolerated.  CPM was placed on the operative leg 0-90 degrees for 6-8 hours a day. When out of the CPM, patient was placed in the foam block to achieve full extension. Incentive spirometry was taught.  Dressing was changed.       POD #2, Continued  PT for ambulation and exercise program.  IV saline locked.  O2 discontinued.    The remainder of the hospital course was dedicated to ambulation and strengthening.   The patient was discharged on 1 Day Post-Op in  Good condition.  Blood products given:none  DIAGNOSTIC STUDIES: Recent vital signs:  Patient Vitals for the past 24 hrs:  BP Temp Temp src Pulse Resp SpO2  10/20/18 0939 140/85 98 F (36.7  C) Oral 90 - 95 %  10/20/18 0518 116/81 97.8 F (36.6 C) Oral 60 17 98 %  10/20/18 0136 125/79 97.6 F (36.4 C) Oral 82 18 96 %  10/19/18 2031 132/86 (!) 97.5 F (36.4 C) Oral 88 17 99 %  10/19/18 1930 - - - 80 18 -  10/19/18 1755 137/86 - - 79 18 99 %  10/19/18 1613 (!) 149/88 (!) 97.4 F (36.3 C) Oral 75 18 98 %  10/19/18 1507 (!) 155/84 - - 66 16 98 %  10/19/18 1407 (!) 140/95 - - 72 16 100 %  10/19/18 1355 (!) 135/95 - - 74 13 99 %  10/19/18 1345 133/75 (!) 97.1 F (36.2 C) - 62 14 98 %  10/19/18 1330 (!) 151/79 - - 71 16 100 %  10/19/18 1315 (!) 112/98 (!) 97.3 F (36.3 C) - 72 15 100 %       Recent laboratory studies: Recent Labs    10/20/18 0431  WBC 10.7*  HGB 11.4*  HCT 35.2*  PLT 157   Recent Labs    10/20/18 0431  NA 138  K 4.6  CL 105  CO2 27  BUN 14  CREATININE 0.86  GLUCOSE 145*  CALCIUM 8.7*    Lab Results  Component Value Date   INR 1.00 01/29/2018   INR 1.07 11/10/2011     Recent Radiographic Studies :  No results found.  DISCHARGE INSTRUCTIONS: Discharge Instructions    CPM   Complete by:  As directed    Continuous passive motion machine (CPM):      Use the CPM from 0 to 90 for 4-6 hours per day.      You may increase by 10 per day.  You may break it up into 2 or 3 sessions per day.      Use CPM for 2 weeks or until you are told to stop.   Call MD / Call 911   Complete by:  As directed    If you experience chest pain or shortness of breath, CALL 911 and be transported to the hospital emergency room.  If you develope a fever above 101 F, pus (white drainage) or increased drainage or redness at the wound, or calf pain, call your surgeon's office.   Constipation Prevention   Complete by:  As directed    Drink plenty of fluids.  Prune juice may be helpful.  You may use a stool softener, such as Colace (over the counter) 100 mg twice a day.  Use MiraLax (over the counter) for constipation as needed.   Diet - low sodium heart healthy   Complete by:  As directed    Discharge instructions   Complete by:  As directed    INSTRUCTIONS AFTER JOINT REPLACEMENT   Remove items at home which could result in a fall. This includes throw rugs or furniture in walking pathways ICE to the affected joint every three hours while awake for 30 minutes at a time, for at least the first 3-5 days, and then as needed for pain and swelling.  Continue to use ice for pain and swelling. You may notice swelling that will progress down to the foot and ankle.  This is normal after surgery.  Elevate your leg when you are not up walking on it.   Continue to use the breathing machine you got in the hospital (incentive spirometer) which will help keep your temperature down.  It is common for your temperature to cycle  up and down following surgery, especially at night when you are not up moving around and  exerting yourself.  The breathing machine keeps your lungs expanded and your temperature down.   DIET:  As you were doing prior to hospitalization, we recommend a well-balanced diet.  DRESSING / WOUND CARE / SHOWERING  Keep the surgical dressing until follow up.  The dressing is water proof, so you can shower without any extra covering.  IF THE DRESSING FALLS OFF or the wound gets wet inside, change the dressing with sterile gauze.  Please use good hand washing techniques before changing the dressing.  Do not use any lotions or creams on the incision until instructed by your surgeon.    ACTIVITY  Increase activity slowly as tolerated, but follow the weight bearing instructions below.   No driving for 6 weeks or until further direction given by your physician.  You cannot drive while taking narcotics.  No lifting or carrying greater than 10 lbs. until further directed by your surgeon. Avoid periods of inactivity such as sitting longer than an hour when not asleep. This helps prevent blood clots.  You may return to work once you are authorized by your doctor.     WEIGHT BEARING   Weight bearing as tolerated with assist device (walker, cane, etc) as directed, use it as long as suggested by your surgeon or therapist, typically at least 4-6 weeks.   EXERCISES  Results after joint replacement surgery are often greatly improved when you follow the exercise, range of motion and muscle strengthening exercises prescribed by your doctor. Safety measures are also important to protect the joint from further injury. Any time any of these exercises cause you to have increased pain or swelling, decrease what you are doing until you are comfortable again and then slowly increase them. If you have problems or questions, call your caregiver or physical therapist for advice.   Rehabilitation is important following a joint replacement. After just a few days of immobilization, the muscles of the leg can become  weakened and shrink (atrophy).  These exercises are designed to build up the tone and strength of the thigh and leg muscles and to improve motion. Often times heat used for twenty to thirty minutes before working out will loosen up your tissues and help with improving the range of motion but do not use heat for the first two weeks following surgery (sometimes heat can increase post-operative swelling).   These exercises can be done on a training (exercise) mat, on the floor, on a table or on a bed. Use whatever works the best and is most comfortable for you.    Use music or television while you are exercising so that the exercises are a pleasant break in your day. This will make your life better with the exercises acting as a break in your routine that you can look forward to.   Perform all exercises about fifteen times, three times per day or as directed.  You should exercise both the operative leg and the other leg as well.   Exercises include:   Quad Sets - Tighten up the muscle on the front of the thigh (Quad) and hold for 5-10 seconds.   Straight Leg Raises - With your knee straight (if you were given a brace, keep it on), lift the leg to 60 degrees, hold for 3 seconds, and slowly lower the leg.  Perform this exercise against resistance later as your leg gets stronger.  Leg Slides:  Lying on your back, slowly slide your foot toward your buttocks, bending your knee up off the floor (only go as far as is comfortable). Then slowly slide your foot back down until your leg is flat on the floor again.  Angel Wings: Lying on your back spread your legs to the side as far apart as you can without causing discomfort.  Hamstring Strength:  Lying on your back, push your heel against the floor with your leg straight by tightening up the muscles of your buttocks.  Repeat, but this time bend your knee to a comfortable angle, and push your heel against the floor.  You may put a pillow under the heel to make it more  comfortable if necessary.   A rehabilitation program following joint replacement surgery can speed recovery and prevent re-injury in the future due to weakened muscles. Contact your doctor or a physical therapist for more information on knee rehabilitation.    CONSTIPATION  Constipation is defined medically as fewer than three stools per week and severe constipation as less than one stool per week.  Even if you have a regular bowel pattern at home, your normal regimen is likely to be disrupted due to multiple reasons following surgery.  Combination of anesthesia, postoperative narcotics, change in appetite and fluid intake all can affect your bowels.   YOU MUST use at least one of the following options; they are listed in order of increasing strength to get the job done.  They are all available over the counter, and you may need to use some, POSSIBLY even all of these options:    Drink plenty of fluids (prune juice may be helpful) and high fiber foods Colace 100 mg by mouth twice a day  Senokot for constipation as directed and as needed Dulcolax (bisacodyl), take with full glass of water  Miralax (polyethylene glycol) once or twice a day as needed.  If you have tried all these things and are unable to have a bowel movement in the first 3-4 days after surgery call either your surgeon or your primary doctor.    If you experience loose stools or diarrhea, hold the medications until you stool forms back up.  If your symptoms do not get better within 1 week or if they get worse, check with your doctor.  If you experience "the worst abdominal pain ever" or develop nausea or vomiting, please contact the office immediately for further recommendations for treatment.   ITCHING:  If you experience itching with your medications, try taking only a single pain pill, or even half a pain pill at a time.  You can also use Benadryl over the counter for itching or also to help with sleep.   TED HOSE STOCKINGS:   Use stockings on both legs until for at least 2 weeks or as directed by physician office. They may be removed at night for sleeping.  MEDICATIONS:  See your medication summary on the "After Visit Summary" that nursing will review with you.  You may have some home medications which will be placed on hold until you complete the course of blood thinner medication.  It is important for you to complete the blood thinner medication as prescribed.  PRECAUTIONS:  If you experience chest pain or shortness of breath - call 911 immediately for transfer to the hospital emergency department.   If you develop a fever greater that 101 F, purulent drainage from wound, increased redness or drainage from wound, foul odor from the wound/dressing, or  calf pain - CONTACT YOUR SURGEON.                                                   FOLLOW-UP APPOINTMENTS:  If you do not already have a post-op appointment, please call the office for an appointment to be seen by your surgeon.  Guidelines for how soon to be seen are listed in your "After Visit Summary", but are typically between 1-4 weeks after surgery.  OTHER INSTRUCTIONS:   Knee Replacement:  Do not place pillow under knee, focus on keeping the knee straight while resting. CPM instructions: 0-90 degrees, 2 hours in the morning, 2 hours in the afternoon, and 2 hours in the evening. Place foam block, curve side up under heel at all times except when in CPM or when walking.  DO NOT modify, tear, cut, or change the foam block in any way.  MAKE SURE YOU:  Understand these instructions.  Get help right away if you are not doing well or get worse.    Thank you for letting us be a part of your medical care team.  It is a privilege we respect greatly.  We hope these instructions will help you stay on track for a fast and full recovery!   Increase activity slowly as tolerated   Complete by:  As directed       DISCHARGE MEDICATIONS:   Allergies as of 10/20/2018       Reactions   Ace Inhibitors Other (See Comments)   unknown   Benadryl [diphenhydramine Hcl] Other (See Comments)   Opposite reaction of medication purpose - hyper   Vioxx [rofecoxib] Other (See Comments)   headache      Medication List    STOP taking these medications   aspirin 81 MG tablet Replaced by:  aspirin 325 MG EC tablet     TAKE these medications   acetaminophen 325 MG tablet Commonly known as:  TYLENOL Take 650 mg by mouth daily as needed for moderate pain or headache.   aspirin 325 MG EC tablet Take 1 tablet (325 mg total) by mouth 2 (two) times daily. Replaces:  aspirin 81 MG tablet   atorvastatin 80 MG tablet Commonly known as:  LIPITOR Take 1 tablet (80 mg total) by mouth daily. What changed:  when to take this   buPROPion 100 MG 12 hr tablet Commonly known as:  WELLBUTRIN SR Take 100 mg by mouth daily.   carvedilol 3.125 MG tablet Commonly known as:  COREG Take 1 tablet (3.125 mg total) by mouth 2 (two) times daily.   fluticasone 50 MCG/ACT nasal spray Commonly known as:  FLONASE Place 1 spray into both nostrils daily as needed for allergies or rhinitis.   gabapentin 300 MG capsule Commonly known as:  NEURONTIN Take 300 mg by mouth 2 (two) times daily.   irbesartan 300 MG tablet Commonly known as:  AVAPRO Take 1 tablet (300 mg total) daily by mouth.   loratadine 10 MG tablet Commonly known as:  CLARITIN Take 10 mg by mouth daily.   Magnesium Malate 1250 (141.7 Mg) MG Tabs Take 1,250 mg by mouth daily.   methocarbamol 500 MG tablet Commonly known as:  ROBAXIN Take 1-2 tablets (500-1,000 mg total) by mouth every 6 (six) hours as needed for muscle spasms.   oxyCODONE 5 MG immediate release tablet  Commonly known as:  Oxy IR/ROXICODONE Take 1-2 tablets (5-10 mg total) by mouth every 6 (six) hours as needed for moderate pain (pain score 4-6).   pantoprazole 40 MG tablet Commonly known as:  PROTONIX Take 1 tablet (40 mg total) by mouth 2 (two)  times daily before a meal.   traZODone 100 MG tablet Commonly known as:  DESYREL Take 100 mg by mouth at bedtime.   venlafaxine 75 MG tablet Commonly known as:  EFFEXOR Take 75 mg by mouth daily.            Durable Medical Equipment  (From admission, onward)         Start     Ordered   10/19/18 1418  DME Walker rolling  Once    Question:  Patient needs a walker to treat with the following condition  Answer:  S/P total knee replacement   10/19/18 1417   10/19/18 1418  DME 3 n 1  Once     10/19/18 1417   10/19/18 1418  DME Bedside commode  Once    Question:  Patient needs a bedside commode to treat with the following condition  Answer:  S/P total knee replacement   10/19/18 1417          FOLLOW UP VISIT:    DISPOSITION: HOME VS. SNF  CONDITION:  Good   Donia Ast 10/20/2018, 12:28 PM

## 2018-10-21 DIAGNOSIS — M1712 Unilateral primary osteoarthritis, left knee: Secondary | ICD-10-CM | POA: Diagnosis not present

## 2018-10-21 DIAGNOSIS — Z96652 Presence of left artificial knee joint: Secondary | ICD-10-CM | POA: Diagnosis not present

## 2018-10-22 ENCOUNTER — Other Ambulatory Visit: Payer: Self-pay

## 2018-10-22 ENCOUNTER — Ambulatory Visit (HOSPITAL_COMMUNITY): Payer: No Typology Code available for payment source | Attending: Orthopedic Surgery | Admitting: Physical Therapy

## 2018-10-22 ENCOUNTER — Encounter (HOSPITAL_COMMUNITY): Payer: Self-pay | Admitting: Physical Therapy

## 2018-10-22 DIAGNOSIS — R2689 Other abnormalities of gait and mobility: Secondary | ICD-10-CM

## 2018-10-22 DIAGNOSIS — M25562 Pain in left knee: Secondary | ICD-10-CM | POA: Diagnosis present

## 2018-10-22 DIAGNOSIS — M6281 Muscle weakness (generalized): Secondary | ICD-10-CM | POA: Insufficient documentation

## 2018-10-22 DIAGNOSIS — M25662 Stiffness of left knee, not elsewhere classified: Secondary | ICD-10-CM | POA: Diagnosis present

## 2018-10-22 NOTE — Therapy (Addendum)
[place], [place], [address] Phone: [phone]   Fax:  [phone]  Physical Therapy Evaluation  Patient Details  Name: Cory [name] MRN: [id number] Date of Birth: May 13, 1946 Referring Provider (PT): Irving Shows, MD   Encounter Date: 10/22/2018  PT End of Session - 10/22/18 1714    Visit Number  1    Number of Visits  13    Date for PT Re-Evaluation  12/03/18   Mini-reassess 11/12/18   Authorization Type  Primary: [facility] based on medical necessity limited to 4 modalities per visit; Secondary: [employer]    Authorization Time Period  10/22/18 - 12/03/18    Authorization - Visit Number  1    Authorization - Number of Visits  10    PT Start Time  1600    PT Stop Time  1646    PT Time Calculation (min)  46 min    Equipment Utilized During Treatment  Gait belt    Activity Tolerance  Patient tolerated treatment well;Patient limited by pain    Behavior During Therapy  WFL for tasks assessed/performed       Past Medical History:  Diagnosis Date  . Anemia   . Arthritis   . Coronary artery disease    a. 09/2001 PCI/BMS to D1: 2.5 x 15 Express 2 BMS;  b. 2011 Cath: stable anatomy; c. 03/2014 Low risk MV, EF 54%;  d. 09/2016 Inf STEMI/PCI [facility]): LM nl, LAD 7m, D1 nl w/ 90% in 35mm inf branch, LCX nl, RCA 99 (Synergy DES x 4 - 3.5x28 prox, 3.5x32m, 2.5x16d, 2.25x16 to RPDA), EF 55%.  Marland Kitchen GERD (gastroesophageal reflux disease)   . Hyperlipidemia   . Hypertensive heart disease   . Myocardial infarction ([place]) 2017  . Pneumonia   . PTSD (post-traumatic stress disorder)     Past Surgical History:  Procedure Laterality Date  . CARDIAC CATHETERIZATION  05/17/2010   left main free of significant lesion; LAD with 60% lesion in mid segment & 60-70% lesion in mid-distal segment with subtotal occlusion at apex & small side branch of diagonal 1 that is subtotally occluded; ramus intermedius with 90%  ostial stenosis; L Cfx w/mild luminal irreg; RCA is dominant vessel with diffuse disease, 50% prox lesion & 60% lesion in mid segment & 60% lesion in PDA  . CATARACT EXTRACTION W/PHACO Right 06/07/2015   Procedure: CATARACT EXTRACTION PHACO AND INTRAOCULAR LENS PLACEMENT (IOC);  Surgeon: Marylynn Pearson, MD;  Location: [facility];  Service: Ophthalmology;  Laterality: Right;  . COLONOSCOPY  02/25/2012   Procedure: COLONOSCOPY;  Surgeon: Jamesetta So, MD;  [facility];  Service: Gastroenterology;  Laterality: N/A;  . CORONARY ANGIOPLASTY WITH STENT PLACEMENT  09/2001   Taxus (2.5x5mm) stent to 1st diagonal   . ESOPHAGEAL DILATION N/A 08/14/2016   Procedure: ESOPHAGEAL DILATION;  Surgeon: Rogene Houston, MD;  [facility];  Service: Endoscopy;  Laterality: N/A;  . ESOPHAGOGASTRODUODENOSCOPY  02/25/2012   Procedure: ESOPHAGOGASTRODUODENOSCOPY (EGD);  Surgeon: Jamesetta So, MD;  [facility];  Service: Gastroenterology;  Laterality: N/A;  . ESOPHAGOGASTRODUODENOSCOPY N/A 07/28/2014   Procedure: ESOPHAGOGASTRODUODENOSCOPY (EGD);  Surgeon: Rogene Houston, MD;  [facility];  Service: Endoscopy;  Laterality: N/A;  200-rescheduled to [place] notified pt  . ESOPHAGOGASTRODUODENOSCOPY N/A 08/14/2016   Procedure: ESOPHAGOGASTRODUODENOSCOPY (EGD);  Surgeon: Rogene Houston, MD;  [facility];  Service: Endoscopy;  Laterality: N/A;  100  . EYE SURGERY Bilateral    lasik surgery   . KNEE ARTHROSCOPY WITH MEDIAL MENISECTOMY Left 02/05/2018   Procedure: LEFT KNEE ARTHROSCOPY WITH PARTIAL MEDIAL MENISECTOMY;  Surgeon: Carole Civil, MD;  Location: [facility];  Service: Orthopedics;  Laterality: Left;  . KNEE SURGERY    . LATERAL EPICONDYLE RELEASE Left 01/21/2014   Procedure: Left Tennis Elbow Release with debridement  tendon repair with reattachment;  Surgeon: Carole Civil, MD;  Location: [facility];  Service: Orthopedics;  Laterality: Left;  . penile implant     . PROSTATE SURGERY    . SHOULDER SURGERY    . TONSILLECTOMY    . TOTAL KNEE ARTHROPLASTY Left 10/19/2018   Procedure: LEFT TOTAL KNEE ARTHROPLASTY;  Surgeon: Vickey Huger, MD;  Location: [facility];  Service: Orthopedics;  Laterality: Left;  with abductor block failed spinal  . TRANSTHORACIC ECHOCARDIOGRAM  03/2010   EF=>55%; LV mildly dilated; LA mod dilated; mild MR & TR; normal RVSP; mild pulm valve regurg    There were no vitals filed for this visit.   Subjective Assessment - 10/22/18 1613    Subjective  Patient stated he first had arthroscopic surgery on his left knee in February and that he was unable to bend the knee much and it progressively got worse and this resulted in him needing a total knee replacement. Patient reported that he had a left total knee replacement on 10/19/18. He stated that he was planning to have home health therapy, but switched to having outpatient physical therapy instead. Patient reported that there were no complications with the surgery. Patient denied any tingling or numbness in his left leg.     Patient is accompained by:  Family member   Wife in waiting area   Pertinent History  Left TKA 10/19/18    Limitations  Standing;Walking;House hold activities    How long can you stand comfortably?  1 minute    How long can you walk comfortably?  4-5 minutes    Patient Stated Goals  Get the knee moving better    Currently in Pain?  Yes    Pain Score  5     Pain Location  Knee    Pain Orientation  Left    Pain Descriptors / Indicators  Sore    Pain Type  Surgical pain    Pain Onset  In the past 7 days    Pain Frequency  Constant    Aggravating Factors   Bending    Pain Relieving Factors  Ice    Effect of Pain on Daily Activities  Severely affects         [facility] PT Assessment - 10/22/18 0001      Assessment   Medical Diagnosis  S/P Left TKA    Referring Provider (PT)  Irving Shows, MD    Onset Date/Surgical Date  10/19/18    Next MD Visit  --   2  weeks   Prior Therapy  Yes, but unsure what for      Precautions   Precautions  Knee    Precaution Comments  Left TKA      Balance Screen   Has the patient fallen in the past 6 months  No    Has the patient had a decrease in activity level because of a fear of falling?   No    Is the patient reluctant to leave their home because of a fear of falling?   No  Home Environment   Living Environment  Private residence    Living Arrangements  Spouse/significant other    Type of [place] to enter    Entrance Stairs-Number of Steps  1    [place]  One level    [place] - 2 wheels;Cane - single point;Bedside commode;Crutches      Prior Function   Level of Independence  Independent;Independent with basic ADLs    Vocation  Retired      Charity fundraiser Status  Within Functional Limits for tasks assessed      Observation/Other Assessments   Focus on Therapeutic Outcomes (FOTO)   28% (72% limited)      Observation/Other Assessments-Edema    Edema  Circumferential      Circumferential Edema   Circumferential - Right  16.5 inches at joint line    Circumferential - Left   15 inches at joint line      Sensation   Light Touch  Not tested   Patient denied any tingling or numbness     ROM / Strength   AROM / PROM / Strength  AROM;Strength      AROM   AROM Assessment Site  Knee    Right/Left Knee  Left;Right    Right Knee Extension  0    Right Knee Flexion  140    Left Knee Extension  14    Left Knee Flexion  53      Strength   Strength Assessment Site  Hip;Knee;Ankle    Right/Left Hip  Right;Left    Right Hip Flexion  4/5    Right Hip Extension  --   Test next session possibly in standing   Right Hip ABduction  --   Test next session possibly in standing   Left Hip Flexion  2/5    Left Hip Extension  --   Test next session possibly in standing   Left Hip ABduction  --   Test next  session possibly in standing   Right/Left Knee  Right;Left    Right Knee Flexion  5/5    Right Knee Extension  5/5    Left Knee Flexion  3+/5    Left Knee Extension  3-/5    Right/Left Ankle  Right;Left    Right Ankle Dorsiflexion  5/5    Left Ankle Dorsiflexion  4+/5      Palpation   Palpation comment  Patient reported tenderness to palpation surrounding left knee joint, patient denied tenderness in calf muscle      Bed Mobility   Bed Mobility  Sit to Supine;Supine to Sit    Supine to Sit  Moderate Assistance - Patient 50-74%    Sit to Supine  Moderate Assistance - Patient 50-74%      Ambulation/Gait   Ambulation Distance (Feet)  78 Feet   2MWT   Assistive device  Rolling walker    Gait Pattern  Step-to pattern;Decreased hip/knee flexion - left;Left flexed knee in stance;Decreased stance time - left;Decreased stride length;Decreased dorsiflexion - left;Decreased weight shift to left;Antalgic    Ambulation Surface  Level;Indoor    Gait velocity  0.19 m/s      Balance   Balance Assessed  Yes      Static Standing Balance   Static Standing - Balance Support  No upper extremity supported    Static Standing Balance -  Activities  Single Leg Stance - Right Leg;Single Leg Stance - Left Leg    Static Standing - Comment/# of Minutes  Right LE: 3 seconds; Left LE: 0 seconds      Standardized Balance Assessment   Standardized Balance Assessment  Five Times Sit to Stand    Five times sit to stand comments   30 second chair rise: 1 sit to stand with bilateral UE assist and left lower extremity out in front      Observations: Noted a pocket of swelling with some mild redness on the lateral side of patient's left knee, no excessive heat felt.          Objective measurements completed on examination: See above findings.      [facility] Adult PT Treatment/Exercise - 10/22/18 0001      Exercises   Exercises  Knee/Hip      Knee/Hip Exercises: Supine   Quad Sets   AROM;Strengthening;Left    Quad Sets Limitations  2 repetitions for HEP demonstration    Short Arc [employer]  AROM;Strengthening;Left    Short Arc Quad Sets Limitations  2 repetitions for HEP demonstration    Heel Slides  AROM;Strengthening;Left    Heel Slides Limitations  1 repetition for HEP demonstration    Other Supine Knee/Hip Exercises  Ankle pumps x 5 for HEP demonstration             PT Education - 10/22/18 1713    Education Details  Discussed examination findings, plan of care, HEP, iceing and elevation and safety.     Person(s) Educated  Patient    Methods  Explanation;Demonstration;Verbal cues;Handout;Tactile cues    Comprehension  Verbalized understanding;Returned demonstration;Need further instruction       PT Short Term Goals - 10/22/18 1719      PT SHORT TERM GOAL #1   Title  Patient will demonstrate understanding and report regular compliance with HEP to improve knee AROM, lower extremity strength, and overall functional mobility.     Time  3    Period  Weeks    Status  New    Target Date  11/12/18      PT SHORT TERM GOAL #2   Title  Patient will demonstrate left knee extension/flexion active range of motion of at least 5-95 degrees to assist with more normalized gait pattern and stair ambulation.    Time  3    Period  Weeks    Status  New    Target Date  11/12/18      PT SHORT TERM GOAL #3   Title  Patient will demonstrate improvement of  MMT grade in all musculature tested as deficient at evaluation to assist with improvement in gait mechanics and stair ambulation.     Time  3    Period  Weeks    Status  New    Target Date  11/12/18      PT SHORT TERM GOAL #4   Title  Patient will perform single limb stance on left/right lower extremity for 3 seconds in order to assist with stair ambulation.    Time  3    Period  Weeks    Status  New    Target Date  11/12/18        PT Long Term Goals - 10/22/18 1722      PT LONG TERM GOAL #1   Title   Patient will improve ROM for left knee extension/flexion to 0-115 degrees to improve squatting, and other functional mobility.  Time  6    Period  Weeks    Status  New    Target Date  12/03/18      PT LONG TERM GOAL #2   Title  Patient will demonstrate improvement of 1 MMT grade in all musculature tested as deficient at evaluation to assist with improvement in gait mechanics and stair ambulation.     Time  6    Period  Weeks    Status  New    Target Date  12/03/18      PT LONG TERM GOAL #3   Title  Patient will demonstrate ability to perform 10 repetitions on the 30 second chair rise indicating improved balance and functional strength.     Time  6    Period  Weeks    Status  New    Target Date  12/03/18      PT LONG TERM GOAL #4   Title  Patient will report ability to ambulate for 10 minutes with least restrictive assistive device with no greater than 3/10 pain for improved ease of performing household activities.    Time  6    Period  Weeks    Status  New    Target Date  12/03/18      PT LONG TERM GOAL #5   Title  Patient will demonstrate ability to ambulate at a gait velocity of at least 0.8 m/s with LRAD on 2MWT indicating improved ability to ambulate safely household and limited community distances.     Time  6    Period  Weeks    Status  New    Target Date  12/03/18             Plan - 10/22/18 1729    Clinical Impression Statement  Patient is a 72 year-old male who presented to physical therapy s/p left TKA on 10/19/18. Upon examination patient demonstrated post-operative deficits including edema in the left knee, decreased AROM in the left knee, decreased strength of the lower extremities, decreased balance, and overall decreased functional mobility. In addition, patient was reporting pain in his left knee which limited including when performing bed mobility and transfers requiring assistance in order to perform transfers and bed mobility. During 2MWT patient  ambulated at a gait speed of 0.19 m/s and required rolling walker in order to ambulate as well as demonstrated several gait deviations indicating decreased safety ambulating at home and in the community at this time. Remainder of session educated patient on methods to decrease swelling, on safety regarding icing, as well as on initial HEP to be performed daily. Patient would benefit from skilled physical therapy in order to address the abovementioned deficits and help patient return to his prior level of function.     History and Personal Factors relevant to plan of care:  Left TKA 10/19/18; history of heart attack, HTN, and PTSD    Clinical Presentation  Stable    Clinical Presentation due to:  FOTO, 30 second chair rise, MMT, ROM, palpation, 2MWT, and clinical judgement    Clinical Decision Making  Low    Rehab Potential  Good    Clinical Impairments Affecting Rehab Potential  Positive: Motivated; Negative: acute    PT Frequency  2x / week    PT Duration  6 weeks    PT Treatment/Interventions  ADLs/Self Care Home Management;Aquatic Therapy;Cryotherapy;Electrical Stimulation;Moist Heat;DME Instruction;Gait training;Stair training;Functional mobility training;Therapeutic activities;Therapeutic exercise;Balance training;Neuromuscular re-education;Patient/family education;Orthotic Fit/Training;Manual techniques;Scar mobilization;Passive range of motion;Dry needling;Energy conservation;Taping  PT Next Visit Plan  Review evaluation and goals and HEP. Measure hip extension and abduction strength when able. Focus on ROM and edema reduction initially and then progress to strengthening. Manual therapy to decrease edema.     PT Home Exercise Plan  10/22/18: Quad sets, heel slides, SAQ, 10x 1x/day; ankle pumps 2 x 30 1x/day    Consulted and Agree with Plan of Care  Patient       Patient will benefit from skilled therapeutic intervention in order to improve the following deficits and impairments:  Abnormal  gait, Decreased balance, Decreased endurance, Decreased mobility, Difficulty walking, Hypomobility, Decreased range of motion, Decreased scar mobility, Increased edema, Decreased activity tolerance, Decreased strength, Impaired flexibility, Pain  Visit Diagnosis: Acute pain of left knee  Stiffness of left knee, not elsewhere classified  Muscle weakness (generalized)  Other abnormalities of gait and mobility     Problem List Patient Active Problem List   Diagnosis Date Noted  . 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 S/P left knee arthroscopy 02/05/18 02/12/2018  . Ischemic cardiomyopathy 11/18/2017  . Acute pain of left knee 01/24/2017  . NSTEMI (non-ST elevated myocardial infarction) ([place]) 01/08/2017  . ST elevation myocardial infarction (STEMI) of inferior wall, subsequent episode of care ([place]) 01/08/2017  . Mixed hyperlipidemia 01/08/2017  . Sinus bradycardia 01/08/2017  . Left tennis elbow 11/08/2013  . Stable angina ([place]) 11/08/2013  . CAD in native artery 11/08/2013  . PTSD (post-traumatic stress disorder) 11/08/2013  . Essential hypertension 11/08/2013  . Dyslipidemia 11/08/2013  . Fracture of phalanx of thumb 12/03/2011   Clarene Critchley PT, DPT 5:43 PM, 10/22/18 [place], [place], [address] Phone: [phone]   Fax:  [phone]  Name: Cory [name] MRN: [id number] Date of Birth: 05-18-1946

## 2018-10-22 NOTE — Patient Instructions (Signed)
Quad Set    With other leg bent, foot flat, slowly tighten muscles on thigh of straight leg while counting out loud to __5__.  Repeat __10__ times. Do __1__ sessions per day.  http://[email or web address].exer.[email or web address]   Copyright  VHI. All rights reserved.  Heel Slide    Bend knee and pull heel toward buttocks. Hold __5__ seconds. Return.  Repeat _10___ times. Do _1___ sessions per day.  http://[email or web address].exer.us/371   Copyright  VHI. All rights reserved.  Short Arc [employer] a large can or rolled towel under leg. Straighten knee and leg. Hold __5__ seconds.  Repeat __10__ times. Do _1___ sessions per day.  http://[email or web address].exer.[email or web address]   Copyright  VHI. All rights reserved.  ANKLE: Pumps    With legs elevated. Point toes down, then up. _30__ reps per set, _2__ sets per day, _7__ days per week   Copyright  VHI. All rights reserved.

## 2018-10-27 ENCOUNTER — Encounter (HOSPITAL_COMMUNITY): Payer: Self-pay | Admitting: Physical Therapy

## 2018-10-27 ENCOUNTER — Ambulatory Visit (HOSPITAL_COMMUNITY): Payer: No Typology Code available for payment source | Admitting: Physical Therapy

## 2018-10-27 DIAGNOSIS — R2689 Other abnormalities of gait and mobility: Secondary | ICD-10-CM

## 2018-10-27 DIAGNOSIS — M25562 Pain in left knee: Secondary | ICD-10-CM | POA: Diagnosis not present

## 2018-10-27 DIAGNOSIS — M6281 Muscle weakness (generalized): Secondary | ICD-10-CM

## 2018-10-27 DIAGNOSIS — M25662 Stiffness of left knee, not elsewhere classified: Secondary | ICD-10-CM

## 2018-10-27 NOTE — Therapy (Signed)
[name], [place], [address] Phone: [phone]   Fax:  [phone]  Physical Therapy Treatment  Patient Details  Name: Cory [name] MRN: [id number] Date of Birth: [birth year] Referring Provider (PT): Irving Shows, MD   Encounter Date: 10/27/2018  PT End of Session - 10/27/18 1313    Visit Number  2    Number of Visits  13    Date for PT Re-Evaluation  12/03/18   Mini-reassess 11/12/18   Authorization Type  Primary: [facility] based on medical necessity limited to 4 modalities per visit; Secondary: [employer]    Authorization Time Period  10/22/18 - 12/03/18    Authorization - Visit Number  2    Authorization - Number of Visits  10    PT Start Time  [id number]    PT Stop Time  1344    PT Time Calculation (min)  39 min    Equipment Utilized During Treatment  Gait belt    Activity Tolerance  Patient tolerated treatment well;Patient limited by pain    Behavior During Therapy  WFL for tasks assessed/performed       Past Medical History:  Diagnosis Date  . Anemia   . Arthritis   . Coronary artery disease    a. 09/2001 PCI/BMS to D1: 2.5 x 15 Express 2 BMS;  b. 2011 Cath: stable anatomy; c. 03/2014 Low risk MV, EF 54%;  d. 09/2016 Inf STEMI/PCI [facility]): LM nl, LAD 36m D1 nl w/ 90% in 113minf branch, LCX nl, RCA 99 (Synergy DES x 4 - 3.5x28 prox, 3.5x3851m.5x16d, 2.25x16 to RPDA), EF 55%.  . GMarland KitchenRD (gastroesophageal reflux disease)   . Hyperlipidemia   . Hypertensive heart disease   . Myocardial infarction ([facility]  . Pneumonia   . PTSD (post-traumatic stress disorder)     Past Surgical History:  Procedure Laterality Date  . CARDIAC CATHETERIZATION  05/17/2010   left main free of significant lesion; LAD with 60% lesion in mid segment & 60-70% lesion in mid-distal segment with subtotal occlusion at apex & small side branch of diagonal 1 that is subtotally occluded; ramus intermedius with 90%  ostial stenosis; L Cfx w/mild luminal irreg; RCA is dominant vessel with diffuse disease, 50% prox lesion & 60% lesion in mid segment & 60% lesion in PDA  . CATARACT EXTRACTION W/PHACO Right 06/07/2015   Procedure: CATARACT EXTRACTION PHACO AND INTRAOCULAR LENS PLACEMENT (IOC);  Surgeon: RoyMarylynn PearsonD;  [facility];  Laterality: Right;  . COLONOSCOPY  02/25/2012   Procedure: COLONOSCOPY;  Surgeon: MarJamesetta SoD;  [facility];  Service: Gastroenterology;  Laterality: N/A;  . CORONARY ANGIOPLASTY WITH STENT PLACEMENT  09/2001   [place]us (2.5x15m41mtent to 1st diagonal   . ESOPHAGEAL DILATION N/A 08/14/2016   Procedure: ESOPHAGEAL DILATION;  Surgeon: NajeRogene Houston;  [facility];  Service: Endoscopy;  Laterality: N/A;  . ESOPHAGOGASTRODUODENOSCOPY  02/25/2012   Procedure: ESOPHAGOGASTRODUODENOSCOPY (EGD);  Surgeon: MarkJamesetta So;  [facility];  Service: Gastroenterology;  Laterality: N/A;  . ESOPHAGOGASTRODUODENOSCOPY N/A 07/28/2014   Procedure: ESOPHAGOGASTRODUODENOSCOPY (EGD);  Surgeon: NajeRogene Houston;  [facility];  Service: Endoscopy;  Laterality: N/A;  200-rescheduled to [address] [place]  . ESOPHAGOGASTRODUODENOSCOPY N/A 08/14/2016   Procedure: ESOPHAGOGASTRODUODENOSCOPY (EGD);  Surgeon: NajeRogene Houston;  [facility];  Service: Endoscopy;  Laterality: N/A;  100  . EYE SURGERY Bilateral    lasik surgery   . KNEE ARTHROSCOPY WITH MEDIAL MENISECTOMY Left 02/05/2018   Procedure: LEFT KNEE ARTHROSCOPY WITH PARTIAL MEDIAL MENISECTOMY;  Surgeon: Carole Civil, MD;  Location: [facility];  Service: Orthopedics;  Laterality: Left;  . KNEE SURGERY    . LATERAL EPICONDYLE RELEASE Left 01/21/2014   Procedure: Left Tennis Elbow Release with debridement  tendon repair with reattachment;  Surgeon: Carole Civil, MD;  Location: [facility];  Service: Orthopedics;  Laterality: Left;  . penile implant     . PROSTATE SURGERY    . SHOULDER SURGERY    . TONSILLECTOMY    . TOTAL KNEE ARTHROPLASTY Left 10/19/2018   Procedure: LEFT TOTAL KNEE ARTHROPLASTY;  Surgeon: Vickey Huger, MD;  Location: [facility];  Service: Orthopedics;  Laterality: Left;  with abductor block failed spinal  . TRANSTHORACIC ECHOCARDIOGRAM  03/2010   EF=>55%; LV mildly dilated; LA mod dilated; mild MR & TR; normal RVSP; mild pulm valve regurg    There were no vitals filed for this visit.  Subjective Assessment - 10/27/18 1311    Subjective  Patient stated that he has been feeling much better. He stated that he has been doing his exercises at home.     Patient is accompained by:  Family member   Wife in waiting area   Pertinent History  Left TKA 10/19/18    Limitations  Standing;Walking;House hold activities    How long can you stand comfortably?  1 minute    How long can you walk comfortably?  4-5 minutes    Patient Stated Goals  Get the knee moving better    Currently in Pain?  No/denies         [facility] PT Assessment - 10/27/18 1321      Assessment   Medical Diagnosis  S/P Left TKA      Observation/Other Assessments   Observations  Noted a pocket of swelling on the lateral side of the left knee with some portion of redness on the inside. Present at evaluation also.      AROM   Left Knee Extension  10    Left Knee Flexion  62                   [facility] Adult PT Treatment/Exercise - 10/27/18 1321      Ambulation/Gait   Ambulation/Gait  Yes    Ambulation/Gait Assistance  5: Supervision    Ambulation Distance (Feet)  226 Feet    Assistive device  Straight cane    Gait Pattern  Decreased hip/knee flexion - left;Left flexed knee in stance;Decreased stance time - left;Decreased stride length;Decreased dorsiflexion - left;Decreased weight shift to left;Antalgic;Step-through pattern    Ambulation Surface  Level;Indoor    Gait Comments  Cues to step forward with cane and operated leg at the same time.       Knee/Hip Exercises: Seated   Heel Slides  AROM;Strengthening;Left;1 set;10 reps    Heel Slides Limitations  5 second holds      Knee/Hip Exercises: Supine   Quad Sets  AROM;Strengthening;Left;10 reps    Straight Leg Raises  Strengthening;Left;1 set;10 reps    Knee Extension  AROM    Knee Extension Limitations  10   Left   Knee Flexion  AROM    Knee Flexion Limitations  62   Left     Manual Therapy   Manual Therapy  Edema management    Manual therapy comments  all manual completed separately from other skilled interventions    Edema Management  Retrodgrade massage to the left lower extremity with bilateral lower extremities elevated to decrease edema             PT Education - 10/27/18 1312    Education Details  Dicussed purpose and technique of interventions throughout session.     Person(s) Educated  Patient    Methods  Explanation    Comprehension  Verbalized understanding       PT Short Term Goals - 10/27/18 1315      PT SHORT TERM GOAL #1   Title  Patient will demonstrate understanding and report regular compliance with HEP to improve knee AROM, lower extremity strength, and overall functional mobility.     Time  3    Period  Weeks    Status  On-going      PT SHORT TERM GOAL #2   Title  Patient will demonstrate left knee extension/flexion active range of motion of at least 5-95 degrees to assist with more normalized gait pattern and stair ambulation.    Time  3    Period  Weeks    Status  On-going      PT SHORT TERM GOAL #3   Title  Patient will demonstrate improvement of  MMT grade in all musculature tested as deficient at evaluation to assist with improvement in gait mechanics and stair ambulation.     Time  3    Period  Weeks    Status  On-going      PT SHORT TERM GOAL #4   Title  Patient will perform single limb stance on left/right lower extremity for 3 seconds in order to assist with stair ambulation.    Time  3    Period  Weeks    Status   On-going        PT Long Term Goals - 10/27/18 1317      PT LONG TERM GOAL #1   Title  Patient will improve ROM for left knee extension/flexion to 0-115 degrees to improve squatting, and other functional mobility.    Time  6    Period  Weeks    Status  On-going      PT LONG TERM GOAL #2   Title  Patient will demonstrate improvement of 1 MMT grade in all musculature tested as deficient at evaluation to assist with improvement in gait mechanics and stair ambulation.     Time  6    Period  Weeks    Status  On-going      PT LONG TERM GOAL #3   Title  Patient will demonstrate ability to perform 10 repetitions on the 30 second chair rise indicating improved balance and functional strength.     Time  6    Period  Weeks    Status  On-going      PT LONG TERM GOAL #4   Title  Patient will report ability to ambulate for 10 minutes with least restrictive assistive device with no greater than 3/10 pain for improved ease of performing household activities.    Time  6    Period  Weeks    Status  On-going      PT LONG TERM GOAL #5   Title  Patient will demonstrate ability to ambulate at a gait velocity of at least 0.8 m/s with LRAD on 2MWT indicating improved ability to ambulate safely household and limited community distances.  Time  6    Period  Weeks    Status  On-going            Plan - 10/27/18 1353    Clinical Impression Statement  This session, patient reported no pain and stated that he was feeling much better. Began session with gait training, progressing patient to ambulating with a single point cane in the right upper extremity with verbal cues and demonstration in order to perform sequencing correctly. Then reviewed patient's evaluation and goals and patient gave his consent of the current goals. Then reviewed patient's HEP and had patient perform an additional couple exercises. Finally ended session with retrograde massage to decrease edema. Discussed with patient that he  should mention the area of swelling in his left knee at his upcoming physician appointment. Plan to continue with progression of exercises focusing on patient's left knee ROM at next session.     Rehab Potential  Good    Clinical Impairments Affecting Rehab Potential  Positive: Motivated; Negative: acute    PT Frequency  2x / week    PT Duration  6 weeks    PT Treatment/Interventions  ADLs/Self Care Home Management;Aquatic Therapy;Cryotherapy;Electrical Stimulation;Moist Heat;DME Instruction;Gait training;Stair training;Functional mobility training;Therapeutic activities;Therapeutic exercise;Balance training;Neuromuscular re-education;Patient/family education;Orthotic Fit/Training;Manual techniques;Scar mobilization;Passive range of motion;Dry needling;Energy conservation;Taping    PT Next Visit Plan  Measure hip extension and abduction strength when able. Focus on ROM and edema reduction initially and then progress to strengthening. Manual therapy to decrease edema.     PT Home Exercise Plan  10/22/18: Quad sets, heel slides, SAQ, 10x 1x/day; ankle pumps 2 x 30 1x/day    Consulted and Agree with Plan of Care  Patient       Patient will benefit from skilled therapeutic intervention in order to improve the following deficits and impairments:  Abnormal gait, Decreased balance, Decreased endurance, Decreased mobility, Difficulty walking, Hypomobility, Decreased range of motion, Decreased scar mobility, Increased edema, Decreased activity tolerance, Decreased strength, Impaired flexibility, Pain  Visit Diagnosis: Acute pain of left knee  Stiffness of left knee, not elsewhere classified  Muscle weakness (generalized)  Other abnormalities of gait and mobility     Problem List Patient Active Problem List   Diagnosis Date Noted  . 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 S/P left knee arthroscopy 02/05/18 02/12/2018  . Ischemic cardiomyopathy 11/18/2017  . Acute pain of left knee 01/24/2017  .  NSTEMI (non-ST elevated myocardial infarction) ([place]) 01/08/2017  . ST elevation myocardial infarction (STEMI) of inferior wall, subsequent episode of care ([place]) 01/08/2017  . Mixed hyperlipidemia 01/08/2017  . Sinus bradycardia 01/08/2017  . Left tennis elbow 11/08/2013  . Stable angina ([place]) 11/08/2013  . CAD in native artery 11/08/2013  . PTSD (post-traumatic stress disorder) 11/08/2013  . Essential hypertension 11/08/2013  . Dyslipidemia 11/08/2013  . Fracture of phalanx of thumb 12/03/2011   Clarene Critchley PT, DPT 1:56 PM, 10/27/18 [place], [place], [address] Phone: [phone]   Fax:  [phone]  Name: TRAEGER DEMARCE MRN: [id number] Date of Birth: Jul 20, 1946

## 2018-10-29 ENCOUNTER — Telehealth (HOSPITAL_COMMUNITY): Payer: Self-pay | Admitting: Physical Therapy

## 2018-10-29 ENCOUNTER — Ambulatory Visit (HOSPITAL_COMMUNITY): Payer: No Typology Code available for payment source | Admitting: Physical Therapy

## 2018-10-29 DIAGNOSIS — Z96652 Presence of left artificial knee joint: Secondary | ICD-10-CM | POA: Diagnosis not present

## 2018-10-29 DIAGNOSIS — M25462 Effusion, left knee: Secondary | ICD-10-CM | POA: Diagnosis not present

## 2018-10-29 NOTE — Telephone Encounter (Signed)
He have a appt in [place] around the same time he is due here and had to cancel.

## 2018-10-30 ENCOUNTER — Ambulatory Visit (HOSPITAL_COMMUNITY): Payer: No Typology Code available for payment source

## 2018-11-03 ENCOUNTER — Ambulatory Visit (HOSPITAL_COMMUNITY): Payer: No Typology Code available for payment source | Attending: Orthopedic Surgery | Admitting: Physical Therapy

## 2018-11-03 ENCOUNTER — Encounter (HOSPITAL_COMMUNITY): Payer: Self-pay | Admitting: Physical Therapy

## 2018-11-03 DIAGNOSIS — R2689 Other abnormalities of gait and mobility: Secondary | ICD-10-CM | POA: Diagnosis present

## 2018-11-03 DIAGNOSIS — M25662 Stiffness of left knee, not elsewhere classified: Secondary | ICD-10-CM | POA: Diagnosis present

## 2018-11-03 DIAGNOSIS — M25562 Pain in left knee: Secondary | ICD-10-CM

## 2018-11-03 DIAGNOSIS — M6281 Muscle weakness (generalized): Secondary | ICD-10-CM

## 2018-11-03 NOTE — Therapy (Signed)
[facility] [place], [place], [address] Phone: [phone]   Fax:  [phone]  Physical Therapy Treatment  Patient Details  Name: Cory [name] MRN: [id number] Date of Birth: [birth year] Referring Provider (PT): Irving Shows, MD   Encounter Date: 11/03/2018  PT End of Session - 11/03/18 1615    Visit Number  3    Number of Visits  13    Date for PT Re-Evaluation  12/03/18   Mini-reassess 11/12/18   Authorization Type  Primary: [facility] based on medical necessity limited to 4 modalities per visit; Secondary: [employer]    Authorization Time Period  10/22/18 - 12/03/18    Authorization - Visit Number  3    Authorization - Number of Visits  10    PT Start Time  [id number]   Patient arrived late   PT Stop Time  1653    PT Time Calculation (min)  47 min    Equipment Utilized During Treatment  --    Activity Tolerance  Patient tolerated treatment well    Behavior During Therapy  WFL for tasks assessed/performed       Past Medical History:  Diagnosis Date  . Anemia   . Arthritis   . Coronary artery disease    a. 09/2001 PCI/BMS to D1: 2.5 x 15 Express 2 BMS;  b. 2011 Cath: stable anatomy; c. 03/2014 Low risk MV, EF 54%;  d. 09/2016 Inf STEMI/PCI [facility]): LM nl, LAD 74m D1 nl w/ 90% in 112minf branch, LCX nl, RCA 99 (Synergy DES x 4 - 3.5x28 prox, 3.5x3869m.5x16d, 2.25x16 to RPDA), EF 55%.  . GMarland KitchenRD (gastroesophageal reflux disease)   . Hyperlipidemia   . Hypertensive heart disease   . [place]ocardial infarction [place]  . Pneumonia   . PTSD (post-traumatic stress disorder)     Past Surgical History:  Procedure Laterality Date  . CARDIAC CATHETERIZATION  05/17/2010   left main free of significant lesion; LAD with 60% lesion in mid segment & 60-70% lesion in mid-distal segment with subtotal occlusion at apex & small side branch of diagonal 1 that is subtotally occluded; ramus intermedius with 90% ostial  stenosis; L Cfx w/mild luminal irreg; RCA is dominant vessel with diffuse disease, 50% prox lesion & 60% lesion in mid segment & 60% lesion in PDA  . CATARACT EXTRACTION W/PHACO Right 06/07/2015   Procedure: CATARACT EXTRACTION PHACO AND INTRAOCULAR LENS PLACEMENT (IOC);  Surgeon: RoyMarylynn PearsonD;  [facility];  Laterality: Right;  . COLONOSCOPY  02/25/2012   Procedure: COLONOSCOPY;  Surgeon: MarJamesetta SoD;  [facility];  Service: Gastroenterology;  Laterality: N/A;  . CORONARY ANGIOPLASTY WITH STENT PLACEMENT  09/2001   [place] (2.5x15m53mtent to 1st diagonal   . ESOPHAGEAL DILATION N/A 08/14/2016   Procedure: ESOPHAGEAL DILATION;  Surgeon: NajeRogene Houston;  [facility];  Service: Endoscopy;  Laterality: N/A;  . ESOPHAGOGASTRODUODENOSCOPY  02/25/2012   Procedure: ESOPHAGOGASTRODUODENOSCOPY (EGD);  Surgeon: MarkJamesetta So;  [facility];  Service: Gastroenterology;  Laterality: N/A;  . ESOPHAGOGASTRODUODENOSCOPY N/A 07/28/2014   Procedure: ESOPHAGOGASTRODUODENOSCOPY (EGD);  Surgeon: NajeRogene Houston;  [facility];  Service: Endoscopy;  Laterality: N/A;  200-res[place]uled to [place]  . ESOPHAGOGASTRODUODENOSCOPY N/A 08/14/2016   Procedure: ESOPHAGOGASTRODUODENOSCOPY (EGD);  Surgeon: NajeRogene Houston;  [facility];  Service: Endoscopy;  Laterality: N/A;  100  . EYE SURGERY Bilateral    lasik surgery   . KNEE ARTHROSCOPY WITH MEDIAL MENISECTOMY Left 02/05/2018   Procedure: LEFT KNEE ARTHROSCOPY WITH PARTIAL MEDIAL MENISECTOMY;  Surgeon: Carole Civil, MD;  Location: [facility];  Service: Orthopedics;  Laterality: Left;  . KNEE SURGERY    . LATERAL EPICONDYLE RELEASE Left 01/21/2014   Procedure: Left Tennis Elbow Release with debridement  tendon repair with reattachment;  Surgeon: Carole Civil, MD;  Location: [facility];  Service: Orthopedics;  Laterality: Left;  . penile implant    .  PROSTATE SURGERY    . SHOULDER SURGERY    . TONSILLECTOMY    . TOTAL KNEE ARTHROPLASTY Left 10/19/2018   Procedure: LEFT TOTAL KNEE ARTHROPLASTY;  Surgeon: Vickey Huger, MD;  Location: [facility];  Service: Orthopedics;  Laterality: Left;  with abductor block failed spinal  . TRANSTHORACIC ECHOCARDIOGRAM  03/2010   EF=>55%; LV mildly dilated; LA mod dilated; mild MR & TR; normal RVSP; mild pulm valve regurg    There were no vitals filed for this visit.  Subjective Assessment - 11/03/18 1614    Subjective  Patient reported that his physician said he was doing well and that he did not comment on the pocket of swelling in his knee.     Patient is accompained by:  Family member   Wife in waiting area   Pertinent History  Left TKA 10/19/18    Limitations  Standing;Walking;House hold activities    How long can you stand comfortably?  1 minute    How long can you walk comfortably?  4-5 minutes    Patient Stated Goals  Get the knee moving better    Currently in Pain?  Yes    Pain Score  5     Pain Location  Knee    Pain Orientation  Left    Pain Descriptors / Indicators  Aching    Pain Type  Surgical pain    Pain Onset  1 to 4 weeks ago    Pain Frequency  Constant                       [facility] Adult PT Treatment/Exercise - 11/03/18 0001      Ambulation/Gait   Ambulation/Gait  Yes    Ambulation/Gait Assistance  5: Supervision    Ambulation Distance (Feet)  226 Feet    Assistive device  None    Gait Pattern  Decreased hip/knee flexion - left;Left flexed knee in stance;Decreased stance time - left;Decreased stride length;Decreased dorsiflexion - left;Decreased weight shift to left;Antalgic;Step-through pattern    Ambulation Surface  Level;Indoor    Gait Comments  Cues to perform heel to toe gait      Exercises   Exercises  Knee/Hip      Knee/Hip Exercises: Stretches   Passive Hamstring Stretch  Left;3 reps;30 seconds    Passive Hamstring Stretch Limitations  On 12 inch  step    Knee: Self-Stretch to increase Flexion  Left;10 seconds;Other (comment)   10 repetitions on 12'' step   Gastroc Stretch  Both;3 reps;30 seconds    Gastroc Stretch Limitations  On slant board      Knee/Hip Exercises: Seated   Long Arc Quad  AROM;Strengthening;Left;1 set;10 reps;Limitations    Long Arc Quad Limitations  3-5 second holds    Heel Slides  AROM;Strengthening;Left;1 set;10 reps    Heel Slides Limitations  5 second holds      Knee/Hip Exercises: Supine  Quad Sets  AROM;Strengthening;Left;10 reps    Short Arc [employer]  AROM;Strengthening;Left;1 set;10 reps    Short [employer] Limitations  5 second holds with foam roller under left knee    Straight Leg Raises  Strengthening;Left;1 set;10 reps    Knee Extension  AROM    Knee Extension Limitations  9    Knee Flexion  AROM    Knee Flexion Limitations  81      Manual Therapy   Manual Therapy  Edema management    Manual therapy comments  all manual completed separately from other skilled interventions    Edema Management  Retrodgrade massage to the left lower extremity with bilateral lower extremities elevated to decrease edema               PT Short Term Goals - 10/27/18 1315      PT SHORT TERM GOAL #1   Title  Patient will demonstrate understanding and report regular compliance with HEP to improve knee AROM, lower extremity strength, and overall functional mobility.     Time  3    Period  Weeks    Status  On-going      PT SHORT TERM GOAL #2   Title  Patient will demonstrate left knee extension/flexion active range of motion of at least 5-95 degrees to assist with more normalized gait pattern and stair ambulation.    Time  3    Period  Weeks    Status  On-going      PT SHORT TERM GOAL #3   Title  Patient will demonstrate improvement of  MMT grade in all musculature tested as deficient at evaluation to assist with improvement in gait mechanics and stair ambulation.     Time  3    Period  Weeks     Status  On-going      PT SHORT TERM GOAL #4   Title  Patient will perform single limb stance on left/right lower extremity for 3 seconds in order to assist with stair ambulation.    Time  3    Period  Weeks    Status  On-going        PT Long Term Goals - 10/27/18 1317      PT LONG TERM GOAL #1   Title  Patient will improve ROM for left knee extension/flexion to 0-115 degrees to improve squatting, and other functional mobility.    Time  6    Period  Weeks    Status  On-going      PT LONG TERM GOAL #2   Title  Patient will demonstrate improvement of 1 MMT grade in all musculature tested as deficient at evaluation to assist with improvement in gait mechanics and stair ambulation.     Time  6    Period  Weeks    Status  On-going      PT LONG TERM GOAL #3   Title  Patient will demonstrate ability to perform 10 repetitions on the 30 second chair rise indicating improved balance and functional strength.     Time  6    Period  Weeks    Status  On-going      PT LONG TERM GOAL #4   Title  Patient will report ability to ambulate for 10 minutes with least restrictive assistive device with no greater than 3/10 pain for improved ease of performing household activities.    Time  6    Period  Weeks  Status  On-going      PT LONG TERM GOAL #5   Title  Patient will demonstrate ability to ambulate at a gait velocity of at least 0.8 m/s with LRAD on 2MWT indicating improved ability to ambulate safely household and limited community distances.     Time  6    Period  Weeks    Status  On-going            Plan - 11/03/18 1708    Clinical Impression Statement  This session continued with established plan of care. Progressed patient with stretches to the left lower extremity to address deficits in AROM. Added hamstring stretch, gastroc stretch, and knee flexion stretch. Progressed patient with ambulation to practicing without the cane providing cues to roll through the foot from heel to  toe. This session patient's left knee AROM ranged from 9 to 81 degrees at the end of the session. Plan to continue progressing with a focus on AROM at this time.     Rehab Potential  Good    Clinical Impairments Affecting Rehab Potential  Positive: Motivated; Negative: acute    PT Frequency  2x / week    PT Duration  6 weeks    PT Treatment/Interventions  ADLs/Self Care Home Management;Aquatic Therapy;Cryotherapy;Electrical Stimulation;Moist Heat;DME Instruction;Gait training;Stair training;Functional mobility training;Therapeutic activities;Therapeutic exercise;Balance training;Neuromuscular re-education;Patient/family education;Orthotic Fit/Training;Manual techniques;Scar mobilization;Passive range of motion;Dry needling;Energy conservation;Taping    PT Next Visit Plan  Measure hip extension and abduction strength when able. Focus on ROM and edema reduction initially and then progress to strengthening. Manual therapy to decrease edema.     PT Home Exercise Plan  10/22/18: Quad sets, heel slides, SAQ, 10x 1x/day; ankle pumps 2 x 30 1x/day; 11/03/18: LAQ x 10 5'' holds    Consulted and Agree with Plan of Care  Patient       Patient will benefit from skilled therapeutic intervention in order to improve the following deficits and impairments:  Abnormal gait, Decreased balance, Decreased endurance, Decreased mobility, Difficulty walking, Hypomobility, Decreased range of motion, Decreased scar mobility, Increased edema, Decreased activity tolerance, Decreased strength, Impaired flexibility, Pain  Visit Diagnosis: Acute pain of left knee  Stiffness of left knee, not elsewhere classified  Muscle weakness (generalized)  Other abnormalities of gait and mobility     Problem List Patient Active Problem List   Diagnosis Date Noted  . 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 S/P left knee arthroscopy 02/05/18 02/12/2018  . Ischemic cardiomyopathy 11/18/2017  . Acute pain of left knee 01/24/2017  .  NSTEMI (non-ST elevated myocardial infarction) ([place]) 01/08/2017  . ST elevation myocardial infarction (STEMI) of inferior wall, subsequent episode of care ([place]) 01/08/2017  . Mixed hyperlipidemia 01/08/2017  . Sinus bradycardia 01/08/2017  . Left tennis elbow 11/08/2013  . Stable angina ([place]) 11/08/2013  . CAD in native artery 11/08/2013  . PTSD (post-traumatic stress disorder) 11/08/2013  . Essential hypertension 11/08/2013  . Dyslipidemia 11/08/2013  . Fracture of phalanx of thumb 12/03/2011   Clarene Critchley PT, DPT 5:09 PM, 11/03/18 [place], [place], [address] Phone: [phone]   Fax:  [phone]  Name: Cory [name] MRN: [id number] Date of Birth: 1946/01/06

## 2018-11-03 NOTE — Patient Instructions (Addendum)
Long [employer]    Straighten operated leg and try to hold it __3-5__ seconds. Use __0__ lbs on ankle. Repeat _10___ times. Do __1__ sessions a day.  http://gt2.exer.us/310   Copyright  VHI. All rights reserved.

## 2018-11-05 ENCOUNTER — Ambulatory Visit (HOSPITAL_COMMUNITY): Payer: No Typology Code available for payment source | Admitting: Physical Therapy

## 2018-11-05 ENCOUNTER — Encounter (HOSPITAL_COMMUNITY): Payer: Self-pay | Admitting: Physical Therapy

## 2018-11-05 DIAGNOSIS — M25562 Pain in left knee: Secondary | ICD-10-CM | POA: Diagnosis not present

## 2018-11-05 DIAGNOSIS — R2689 Other abnormalities of gait and mobility: Secondary | ICD-10-CM

## 2018-11-05 DIAGNOSIS — M25662 Stiffness of left knee, not elsewhere classified: Secondary | ICD-10-CM

## 2018-11-05 DIAGNOSIS — M6281 Muscle weakness (generalized): Secondary | ICD-10-CM

## 2018-11-05 NOTE — Therapy (Addendum)
[place], [place], [address] Phone: [phone]   Fax:  [phone]  Physical Therapy Treatment  Patient Details  Name: Cory [name] MRN: [id number] Date of Birth: 25-Feb-1946 Referring Provider (PT): Irving Shows, MD   Encounter Date: 11/05/2018  PT End of Session - 11/05/18 1606    Visit Number  4    Number of Visits  13    Date for PT Re-Evaluation  12/03/18   Mini-reassess 11/12/18   Authorization Type  Primary: [facility] based on medical necessity limited to 4 modalities per visit; [employer] [employer] HMO    Authorization Time Period  10/22/18 - 12/03/18    Authorization - Visit Number  4    Authorization - Number of Visits  10    PT Start Time  1600    PT Stop Time  1645    PT Time Calculation (min)  45 min    Activity Tolerance  Patient tolerated treatment well    Behavior During Therapy  WFL for tasks assessed/performed       Past Medical History:  Diagnosis Date  . Anemia   . Arthritis   . Coronary artery disease    a. 09/2001 PCI/BMS to D1: 2.5 x 15 Express 2 BMS;  b. 2011 Cath: stable anatomy; c. 03/2014 Low risk MV, EF 54%;  d. 09/2016 Inf STEMI/PCI [facility]): LM nl, LAD 92m D1 nl w/ 90% in 191minf branch, LCX nl, RCA 99 (Synergy DES x 4 - 3.5x28 prox, 3.5x3849m.5x16d, 2.25x16 to RPDA), EF 55%.  . GMarland KitchenRD (gastroesophageal reflux disease)   . Hyperlipidemia   . Hypertensive heart disease   [place]  . Pneumonia   . PTSD (post-traumatic stress disorder)     Past Surgical History:  Procedure Laterality Date  . CARDIAC CATHETERIZATION  05/17/2010   left main free of significant lesion; LAD with 60% lesion in mid segment & 60-70% lesion in mid-distal segment with subtotal occlusion at apex & small side branch of diagonal 1 that is subtotally occluded; ramus intermedius with 90% ostial stenosis; L Cfx w/mild luminal irreg; RCA is dominant vessel with  diffuse disease, 50% prox lesion & 60% lesion in mid segment & 60% lesion in PDA  . CATARACT EXTRACTION W/PHACO Right 06/07/2015   Procedure: CATARACT EXTRACTION PHACO AND INTRAOCULAR LENS PLACEMENT (IOC);  Surgeon: RoyMarylynn PearsonD;  [facility]  Laterality: Right;  . COLONOSCOPY  02/25/2012   Procedure: COLONOSCOPY;  Surgeon: MarJamesetta SoD;  [facility];  Service: Gastroenterology;  Laterality: N/A;  . CORONARY ANGIOPLASTY WITH STENT PLACEMENT  09/2001   [place] (2.5x15m44mtent to 1st diagonal   . ESOPHAGEAL DILATION N/A 08/14/2016   Procedure: ESOPHAGEAL DILATION;  Surgeon: NajeRogene Houston;  [facility];  Service: Endoscopy;  Laterality: N/A;  . ESOPHAGOGASTRODUODENOSCOPY  02/25/2012   Procedure: ESOPHAGOGASTRODUODENOSCOPY (EGD);  Surgeon: MarkJamesetta So;  [facility];  Service: Gastroenterology;  Laterality: N/A;  . ESOPHAGOGASTRODUODENOSCOPY N/A 07/28/2014   Procedure: ESOPHAGOGASTRODUODENOSCOPY (EGD);  Surgeon: NajeRogene Houston;  [facility];  Service: Endoscopy;  Laterality: N/A;  200-rescheduled to [address] [place]  . ESOPHAGOGASTRODUODENOSCOPY N/A 08/14/2016   Procedure: ESOPHAGOGASTRODUODENOSCOPY (EGD);  Surgeon: NajeRogene Houston;  [facility];  Service: Endoscopy;  Laterality: N/A;  100  . EYE SURGERY Bilateral    lasik surgery   .  KNEE ARTHROSCOPY WITH MEDIAL MENISECTOMY Left 02/05/2018   Procedure: LEFT KNEE ARTHROSCOPY WITH PARTIAL MEDIAL MENISECTOMY;  Surgeon: Carole Civil, MD;  Location: [facility];  Service: Orthopedics;  Laterality: Left;  . KNEE SURGERY    . LATERAL EPICONDYLE RELEASE Left 01/21/2014   Procedure: Left Tennis Elbow Release with debridement  tendon repair with reattachment;  Surgeon: Carole Civil, MD;  Location: [facility];  Service: Orthopedics;  Laterality: Left;  . penile implant    . PROSTATE SURGERY    . SHOULDER SURGERY    . TONSILLECTOMY    .  TOTAL KNEE ARTHROPLASTY Left 10/19/2018   Procedure: LEFT TOTAL KNEE ARTHROPLASTY;  Surgeon: Vickey Huger, MD;  Location: [facility];  Service: Orthopedics;  Laterality: Left;  with abductor block failed spinal  . TRANSTHORACIC ECHOCARDIOGRAM  03/2010   EF=>55%; LV mildly dilated; LA mod dilated; mild MR & TR; normal RVSP; mild pulm valve regurg    There were no vitals filed for this visit.  Subjective Assessment - 11/05/18 1602    Subjective  Patient reported that he did not have any soreness after last session. Patient denied any pain this session.     Patient is accompained by:  Family member   Wife in waiting area   Pertinent History  Left TKA 10/19/18    Limitations  Standing;Walking;House hold activities    How long can you stand comfortably?  1 minute    How long can you walk comfortably?  4-5 minutes    Patient Stated Goals  Get the knee moving better    Currently in Pain?  No/denies         [facility] PT Assessment - 11/05/18 0001      Strength   Right Hip Extension  3-/5   (Tested supine)   Right Hip ABduction  3-/5 (Tested supine)   Left Hip Extension  3-/5   (Tested supine)   Left Hip ABduction  3-/5   (Tested supine)                  [facility] Adult PT Treatment/Exercise - 11/05/18 0001      Exercises   Exercises  Knee/Hip      Knee/Hip Exercises: Stretches   Passive Hamstring Stretch  Left;3 reps;30 seconds    Passive Hamstring Stretch Limitations  On 12 inch step    Knee: Self-Stretch to increase Flexion  Left;10 seconds;Other (comment)   10 repetitions on 12'' step   Gastroc Stretch  Both;3 reps;30 seconds    Gastroc Stretch Limitations  On slant board      Knee/Hip Exercises: Standing   Rocker Board  2 minutes    Rocker Board Limitations  lateral with bilateral HHA      Knee/Hip Exercises: Seated   Long Arc Quad  AROM;Strengthening;Left;10 reps;Limitations;2 sets    [employer] Limitations  3-5 second holds    Heel Slides   AROM;Strengthening;Left;10 reps;2 sets    Heel Slides Limitations  5 second holds   Cues to slide heel back a little further during hold     Knee/Hip Exercises: Supine   Quad Sets  AROM;Strengthening;Left;10 reps;2 sets    Short Arc Quad Sets  AROM;Strengthening;Left;10 reps;2 sets    [employer] Limitations  5 second holds with foam roller under knee    Knee Extension  AROM    Knee Extension Limitations  8    Knee Flexion  AROM    Knee Flexion Limitations  82  Manual Therapy   Manual Therapy  Edema management    Manual therapy comments  all manual completed separately from other skilled interventions    Edema Management  Retrodgrade massage to the left lower extremity with bilateral lower extremities elevated to decrease edema             PT Education - 11/05/18 1605    Education Details  Discussed purpose and technique with interventions throughout session and increasing HEP exercises to 2 sets of 10 repetitions.     Person(s) Educated  Patient    Methods  Explanation    Comprehension  Verbalized understanding       PT Short Term Goals - 10/27/18 1315      PT SHORT TERM GOAL #1   Title  Patient will demonstrate understanding and report regular compliance with HEP to improve knee AROM, lower extremity strength, and overall functional mobility.     Time  3    Period  Weeks    Status  On-going      PT SHORT TERM GOAL #2   Title  Patient will demonstrate left knee extension/flexion active range of motion of at least 5-95 degrees to assist with more normalized gait pattern and stair ambulation.    Time  3    Period  Weeks    Status  On-going      PT SHORT TERM GOAL #3   Title  Patient will demonstrate improvement of  MMT grade in all musculature tested as deficient at evaluation to assist with improvement in gait mechanics and stair ambulation.     Time  3    Period  Weeks    Status  On-going      PT SHORT TERM GOAL #4   Title  Patient will perform  single limb stance on left/right lower extremity for 3 seconds in order to assist with stair ambulation.    Time  3    Period  Weeks    Status  On-going        PT Long Term Goals - 10/27/18 1317      PT LONG TERM GOAL #1   Title  Patient will improve ROM for left knee extension/flexion to 0-115 degrees to improve squatting, and other functional mobility.    Time  6    Period  Weeks    Status  On-going      PT LONG TERM GOAL #2   Title  Patient will demonstrate improvement of 1 MMT grade in all musculature tested as deficient at evaluation to assist with improvement in gait mechanics and stair ambulation.     Time  6    Period  Weeks    Status  On-going      PT LONG TERM GOAL #3   Title  Patient will demonstrate ability to perform 10 repetitions on the 30 second chair rise indicating improved balance and functional strength.     Time  6    Period  Weeks    Status  On-going      PT LONG TERM GOAL #4   Title  Patient will report ability to ambulate for 10 minutes with least restrictive assistive device with no greater than 3/10 pain for improved ease of performing household activities.    Time  6    Period  Weeks    Status  On-going      PT LONG TERM GOAL #5   Title  Patient will demonstrate ability to ambulate at a  gait velocity of at least 0.8 m/s with LRAD on 2MWT indicating improved ability to ambulate safely household and limited community distances.     Time  6    Period  Weeks    Status  On-going            Plan - 11/05/18 1634    Clinical Impression Statement  This session continued with established plan of care focusing on improving ROM and reducing edema. This session added rockerboard exercise as well as increased the number of sets with some exercises. This session patient's left knee AROM ranged from 8 to 82 degrees this session. Noted that the pocket of swelling in patient's knee was significantly reduced this session and that the area of redness had  decreased. Plan to continue with focus on improving patient's left knee active flexion and extension.      Rehab Potential  Good    Clinical Impairments Affecting Rehab Potential  Positive: Motivated; Negative: acute    PT Frequency  2x / week    PT Duration  6 weeks    PT Treatment/Interventions  ADLs/Self Care Home Management;Aquatic Therapy;Cryotherapy;Electrical Stimulation;Moist Heat;DME Instruction;Gait training;Stair training;Functional mobility training;Therapeutic activities;Therapeutic exercise;Balance training;Neuromuscular re-education;Patient/family education;Orthotic Fit/Training;Manual techniques;Scar mobilization;Passive range of motion;Dry needling;Energy conservation;Taping    PT Next Visit Plan  Add TKE and bike in upcoming sessions as able. Focus on ROM and edema reduction initially and then progress to strengthening. Manual therapy to decrease edema.     PT Home Exercise Plan  10/22/18: Quad sets, heel slides, SAQ, 2x10 1x/day; ankle pumps 2 x 30 1x/day; 11/03/18: LAQ 2 x 10 5'' holds    Consulted and Agree with Plan of Care  Patient       Patient will benefit from skilled therapeutic intervention in order to improve the following deficits and impairments:  Abnormal gait, Decreased balance, Decreased endurance, Decreased mobility, Difficulty walking, Hypomobility, Decreased range of motion, Decreased scar mobility, Increased edema, Decreased activity tolerance, Decreased strength, Impaired flexibility, Pain  Visit Diagnosis: Acute pain of left knee  Stiffness of left knee, not elsewhere classified  Muscle weakness (generalized)  Other abnormalities of gait and mobility     Problem List Patient Active Problem List   Diagnosis Date Noted  . 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 S/P left knee arthroscopy 02/05/18 02/12/2018  . Ischemic cardiomyopathy 11/18/2017  . Acute pain of left knee 01/24/2017  . NSTEMI (non-ST elevated myocardial infarction) ([place]) 01/08/2017   . ST elevation myocardial infarction (STEMI) of inferior wall, subsequent episode of care ([place]) 01/08/2017  . Mixed hyperlipidemia 01/08/2017  . Sinus bradycardia 01/08/2017  . Left tennis elbow 11/08/2013  . Stable angina ([place]) 11/08/2013  . CAD in native artery 11/08/2013  . PTSD (post-traumatic stress disorder) 11/08/2013  . Essential hypertension 11/08/2013  . Dyslipidemia 11/08/2013  . Fracture of phalanx of thumb 12/03/2011   Clarene Critchley PT, DPT 5:20 PM, 11/05/18 [place], [place], [address] Phone: [phone]   Fax:  [phone]  Name: Cory [name] MRN: [id number] Date of Birth: [birth year]

## 2018-11-10 ENCOUNTER — Ambulatory Visit (HOSPITAL_COMMUNITY): Payer: No Typology Code available for payment source | Admitting: Physical Therapy

## 2018-11-10 ENCOUNTER — Encounter (HOSPITAL_COMMUNITY): Payer: Self-pay | Admitting: Physical Therapy

## 2018-11-10 DIAGNOSIS — R2689 Other abnormalities of gait and mobility: Secondary | ICD-10-CM

## 2018-11-10 DIAGNOSIS — M25562 Pain in left knee: Secondary | ICD-10-CM | POA: Diagnosis not present

## 2018-11-10 DIAGNOSIS — M25662 Stiffness of left knee, not elsewhere classified: Secondary | ICD-10-CM

## 2018-11-10 DIAGNOSIS — M6281 Muscle weakness (generalized): Secondary | ICD-10-CM

## 2018-11-10 NOTE — Therapy (Signed)
[place], [place], [address] Phone: [phone]   Fax:  [phone]  Physical Therapy Treatment  Patient Details  Name: Cory [name] MRN: [id number] Date of Birth: 04/07/1946 Referring Provider (PT): Irving Shows, MD   Encounter Date: 11/10/2018  PT End of Session - 11/10/18 1631    Visit Number  5    Number of Visits  13    Date for PT Re-Evaluation  12/03/18   Mini-reassess 11/12/18   Authorization Type  Primary: [facility] based on medical necessity limited to 4 modalities per visit; [employer]    Authorization Time Period  10/22/18 - 12/03/18    Authorization - Visit Number  5    Authorization - Number of Visits  10    PT Start Time  3354    PT Stop Time  1645    PT Time Calculation (min)  48 min    Activity Tolerance  Patient tolerated treatment well    Behavior During Therapy  WFL for tasks assessed/performed       Past Medical History:  Diagnosis Date  . Anemia   . Arthritis   . Coronary artery disease    a. 09/2001 PCI/BMS to D1: 2.5 x 15 Express 2 BMS;  b. 2011 Cath: stable anatomy; c. 03/2014 Low risk MV, EF 54%;  d. 09/2016 Inf STEMI/PCI [facility]): LM nl, LAD 41m, D1 nl w/ 90% in 54mm inf branch, LCX nl, RCA 99 (Synergy DES x 4 - 3.5x28 prox, 3.5x56m, 2.5x16d, 2.25x16 to RPDA), EF 55%.  Marland Kitchen GERD (gastroesophageal reflux disease)   . Hyperlipidemia   . Hypertensive heart disease   . Myocardial infarction ([place]) 2017  . Pneumonia   . PTSD (post-traumatic stress disorder)     Past Surgical History:  Procedure Laterality Date  . CARDIAC CATHETERIZATION  05/17/2010   left main free of significant lesion; LAD with 60% lesion in mid segment & 60-70% lesion in mid-distal segment with subtotal occlusion at apex & small side branch of diagonal 1 that is subtotally occluded; ramus intermedius with 90% ostial stenosis; L Cfx w/mild luminal irreg; RCA is dominant vessel with  diffuse disease, 50% prox lesion & 60% lesion in mid segment & 60% lesion in PDA  . CATARACT EXTRACTION W/PHACO Right 06/07/2015   Procedure: CATARACT EXTRACTION PHACO AND INTRAOCULAR LENS PLACEMENT (IOC);  Surgeon: Marylynn Pearson, MD;  Location: [facility];  Service: Ophthalmology;  Laterality: Right;  . COLONOSCOPY  02/25/2012   Procedure: COLONOSCOPY;  Surgeon: Jamesetta So, MD;  [facility];  Service: Gastroenterology;  Laterality: N/A;  . CORONARY ANGIOPLASTY WITH STENT PLACEMENT  09/2001   Taxus (2.5x45mm) stent to 1st diagonal   . ESOPHAGEAL DILATION N/A 08/14/2016   Procedure: ESOPHAGEAL DILATION;  Surgeon: Rogene Houston, MD;  [facility];  Service: Endoscopy;  Laterality: N/A;  . ESOPHAGOGASTRODUODENOSCOPY  02/25/2012   Procedure: ESOPHAGOGASTRODUODENOSCOPY (EGD);  Surgeon: Jamesetta So, MD;  [facility];  Service: Gastroenterology;  Laterality: N/A;  . ESOPHAGOGASTRODUODENOSCOPY N/A 07/28/2014   Procedure: ESOPHAGOGASTRODUODENOSCOPY (EGD);  Surgeon: Rogene Houston, MD;  [facility];  Service: Endoscopy;  Laterality: N/A;  200-rescheduled to [facility] notified pt  . ESOPHAGOGASTRODUODENOSCOPY N/A 08/14/2016   Procedure: ESOPHAGOGASTRODUODENOSCOPY (EGD);  Surgeon: Rogene Houston, MD;  [facility];  Service: Endoscopy;  Laterality: N/A;  100  . EYE SURGERY Bilateral    lasik surgery   .  KNEE ARTHROSCOPY WITH MEDIAL MENISECTOMY Left 02/05/2018   Procedure: LEFT KNEE ARTHROSCOPY WITH PARTIAL MEDIAL MENISECTOMY;  Surgeon: Carole Civil, MD;  Location: [facility];  Service: Orthopedics;  Laterality: Left;  . KNEE SURGERY    . LATERAL EPICONDYLE RELEASE Left 01/21/2014   Procedure: Left Tennis Elbow Release with debridement  tendon repair with reattachment;  Surgeon: Carole Civil, MD;  Location: [facility];  Service: Orthopedics;  Laterality: Left;  . penile implant    . PROSTATE SURGERY    . SHOULDER SURGERY    . TONSILLECTOMY    .  TOTAL KNEE ARTHROPLASTY Left 10/19/2018   Procedure: LEFT TOTAL KNEE ARTHROPLASTY;  Surgeon: Vickey Huger, MD;  Location: [facility];  Service: Orthopedics;  Laterality: Left;  with abductor block failed spinal  . TRANSTHORACIC ECHOCARDIOGRAM  03/2010   EF=>55%; LV mildly dilated; LA mod dilated; mild MR & TR; normal RVSP; mild pulm valve regurg    There were no vitals filed for this visit.  Subjective Assessment - 11/10/18 1601    Subjective  Patient reported that he has been doing his exercises at home. He stated that he isn't having pain except occassionally when he bends his knee.     Patient is accompained by:  Family member   Wife in waiting area   Pertinent History  Left TKA 10/19/18    Limitations  Standing;Walking;House hold activities    How long can you stand comfortably?  1 minute    How long can you walk comfortably?  4-5 minutes    Patient Stated Goals  Get the knee moving better    Currently in Pain?  No/denies                       [facility] Adult PT Treatment/Exercise - 11/10/18 0001      Exercises   Exercises  Knee/Hip      Knee/Hip Exercises: Stretches   Passive Hamstring Stretch  Left;3 reps;30 seconds    Passive Hamstring Stretch Limitations  On 12 inch step    Hip Flexor Stretch  Left;3 reps;20 seconds    Hip Flexor Stretch Limitations  Supine with rope    Knee: Self-Stretch to increase Flexion  Left;10 seconds;Other (comment)   10'' holds on 12'' step   Gastroc Stretch  Both;3 reps;30 seconds    Gastroc Stretch Limitations  On slant board      Knee/Hip Exercises: Standing   Heel Raises  Both;1 set;10 reps;2 seconds    Heel Raises Limitations  1x10 2'' holds toe raises    Terminal Knee Extension  Strengthening;Left;1 set;AROM;10 reps;Theraband    Theraband Level (Terminal Knee Extension)  Level 2 (Red)    Terminal Knee Extension Limitations  Red theraband 10 x 10''     Rocker Board  2 minutes    Rocker Board Limitations  lateral with bilateral  HHA      Knee/Hip Exercises: Supine   Quad Sets  AROM;Strengthening;Left;10 reps;2 sets    Knee Extension  AROM    Knee Extension Limitations  6    Knee Flexion  AAROM    Knee Flexion Limitations  90      Knee/Hip Exercises: Sidelying   Hip ABduction  Strengthening;Left;1 set;10 reps    Hip ABduction Limitations  Verbal cues for form      Manual Therapy   Manual Therapy  Edema management;Joint mobilization    Manual therapy comments  all manual completed separately from other skilled interventions  Edema Management  Retrodgrade massage to the left lower extremity with bilateral lower extremities elevated to decrease edema    Joint Mobilization  Left patellofemoral joint mobilizations grade IV in all directions             PT Education - 11/10/18 1614    Education Details  Updated patient's HEP.     Person(s) Educated  Patient    Methods  Explanation;Demonstration;Handout    Comprehension  Verbalized understanding       PT Short Term Goals - 10/27/18 1315      PT SHORT TERM GOAL #1   Title  Patient will demonstrate understanding and report regular compliance with HEP to improve knee AROM, lower extremity strength, and overall functional mobility.     Time  3    Period  Weeks    Status  On-going      PT SHORT TERM GOAL #2   Title  Patient will demonstrate left knee extension/flexion active range of motion of at least 5-95 degrees to assist with more normalized gait pattern and stair ambulation.    Time  3    Period  Weeks    Status  On-going      PT SHORT TERM GOAL #3   Title  Patient will demonstrate improvement of  MMT grade in all musculature tested as deficient at evaluation to assist with improvement in gait mechanics and stair ambulation.     Time  3    Period  Weeks    Status  On-going      PT SHORT TERM GOAL #4   Title  Patient will perform single limb stance on left/right lower extremity for 3 seconds in order to assist with stair ambulation.    Time   3    Period  Weeks    Status  On-going        PT Long Term Goals - 10/27/18 1317      PT LONG TERM GOAL #1   Title  Patient will improve ROM for left knee extension/flexion to 0-115 degrees to improve squatting, and other functional mobility.    Time  6    Period  Weeks    Status  On-going      PT LONG TERM GOAL #2   Title  Patient will demonstrate improvement of 1 MMT grade in all musculature tested as deficient at evaluation to assist with improvement in gait mechanics and stair ambulation.     Time  6    Period  Weeks    Status  On-going      PT LONG TERM GOAL #3   Title  Patient will demonstrate ability to perform 10 repetitions on the 30 second chair rise indicating improved balance and functional strength.     Time  6    Period  Weeks    Status  On-going      PT LONG TERM GOAL #4   Title  Patient will report ability to ambulate for 10 minutes with least restrictive assistive device with no greater than 3/10 pain for improved ease of performing household activities.    Time  6    Period  Weeks    Status  On-going      PT LONG TERM GOAL #5   Title  Patient will demonstrate ability to ambulate at a gait velocity of at least 0.8 m/s with LRAD on 2MWT indicating improved ability to ambulate safely household and limited community distances.     Time  6    Period  Weeks    Status  On-going            Plan - 11/10/18 1656    Clinical Impression Statement  This session continued with focusing on improving patient's AROM in his knee. This session progressed patient to performing standing heel/toe raises and hip abduction strengthening exercises and added these to the patient's HEP. This session had patient perform hip flexor stretching to promote relaxation and ability for patient to lay the left leg onto the bed. This session also performed patellofemoral joint mobilizations in all directions to patient's left patella in order to improve mobility with knee extension and  flexion. This session patient's left knee ROM improved to 6 degrees of extension to 90 degrees of flexion. Patient would benefit from continued skilled physical therapy in order to continue progressing towards functional goals.     Rehab Potential  Good    Clinical Impairments Affecting Rehab Potential  Positive: Motivated; Negative: acute    PT Frequency  2x / week    PT Duration  6 weeks    PT Treatment/Interventions  ADLs/Self Care Home Management;Aquatic Therapy;Cryotherapy;Electrical Stimulation;Moist Heat;DME Instruction;Gait training;Stair training;Functional mobility training;Therapeutic activities;Therapeutic exercise;Balance training;Neuromuscular re-education;Patient/family education;Orthotic Fit/Training;Manual techniques;Scar mobilization;Passive range of motion;Dry needling;Energy conservation;Taping    PT Next Visit Plan  Add bike in upcoming sessions as able. Focus on ROM and edema reduction initially and then progress to strengthening. Manual therapy to decrease edema.     PT Home Exercise Plan  10/22/18: Quad sets, heel slides, SAQ, 2x10 1x/day; ankle pumps 2 x 30 1x/day; 11/03/18: LAQ 2 x 10 5'' holds; 11/10/18: Heel/toe raises x10, hip abduction x 10 2x/day    Consulted and Agree with Plan of Care  Patient       Patient will benefit from skilled therapeutic intervention in order to improve the following deficits and impairments:  Abnormal gait, Decreased balance, Decreased endurance, Decreased mobility, Difficulty walking, Hypomobility, Decreased range of motion, Decreased scar mobility, Increased edema, Decreased activity tolerance, Decreased strength, Impaired flexibility, Pain  Visit Diagnosis: Acute pain of left knee  Stiffness of left knee, not elsewhere classified  Muscle weakness (generalized)  Other abnormalities of gait and mobility     Problem List Patient Active Problem List   Diagnosis Date Noted  . 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 S/P left knee  arthroscopy 02/05/18 02/12/2018  . Ischemic cardiomyopathy 11/18/2017  . Acute pain of left knee 01/24/2017  . NSTEMI (non-ST elevated myocardial infarction) ([place]) 01/08/2017  . ST elevation myocardial infarction (STEMI) of inferior wall, subsequent episode of care ([place]) 01/08/2017  . Mixed hyperlipidemia 01/08/2017  . Sinus bradycardia 01/08/2017  . Left tennis elbow 11/08/2013  . Stable angina ([place]) 11/08/2013  . CAD in native artery 11/08/2013  . PTSD (post-traumatic stress disorder) 11/08/2013  . Essential hypertension 11/08/2013  . Dyslipidemia 11/08/2013  . Fracture of phalanx of thumb 12/03/2011   Clarene Critchley PT, DPT 5:01 PM, 11/10/18 [place], [place], [address] Phone: [phone]   Fax:  [phone]  Name: Cory [name] MRN: [id number] Date of Birth: Jan 05, 1946

## 2018-11-10 NOTE — Patient Instructions (Signed)
Ankle Plantar Flexion / Dorsiflexion, Standing    Stand while holding a stable object. Rise up on toes 10 times. Then raise up on heels 10. Hold each position _2__ seconds. Do _2__ sessions per day.  Copyright  VHI. All rights reserved.  Hip Abduction: Modified    Lying on right side with pillow between thighs, raise top leg from pillow, rotating slightly out. Repeat __10__ times per set. Do _1___ sets per session. Do __2__ sessions per day.  http://orth.exer.us/704   Copyright  VHI. All rights reserved.

## 2018-11-12 ENCOUNTER — Encounter (HOSPITAL_COMMUNITY): Payer: Self-pay | Admitting: Physical Therapy

## 2018-11-12 ENCOUNTER — Ambulatory Visit (HOSPITAL_COMMUNITY): Payer: No Typology Code available for payment source | Admitting: Physical Therapy

## 2018-11-12 DIAGNOSIS — M6281 Muscle weakness (generalized): Secondary | ICD-10-CM

## 2018-11-12 DIAGNOSIS — R2689 Other abnormalities of gait and mobility: Secondary | ICD-10-CM

## 2018-11-12 DIAGNOSIS — M25562 Pain in left knee: Secondary | ICD-10-CM | POA: Diagnosis not present

## 2018-11-12 DIAGNOSIS — M25662 Stiffness of left knee, not elsewhere classified: Secondary | ICD-10-CM

## 2018-11-12 NOTE — Therapy (Signed)
[place], [place], [address] Phone: [phone]   Fax:  [phone]  Physical Therapy Treatment  Patient Details  Name: Cory [name] MRN: [id number] Date of Birth: 28-Mar-1946 Referring Provider (PT): Irving Shows, MD   Encounter Date: 11/12/2018  PT End of Session - 11/12/18 1612    Visit Number  6    Number of Visits  13    Date for PT Re-Evaluation  12/03/18   Mini-reassess 11/12/18   Authorization Type  Primary: [facility] based on medical necessity limited to 4 modalities per visit; [employer]    Authorization Time Period  10/22/18 - 12/03/18    Authorization - Visit Number  6    Authorization - Number of Visits  10    PT Start Time  1605    PT Stop Time  1645    PT Time Calculation (min)  40 min    Activity Tolerance  Patient tolerated treatment well    Behavior During Therapy  WFL for tasks assessed/performed       Past Medical History:  Diagnosis Date  . Anemia   . Arthritis   . Coronary artery disease    a. 09/2001 PCI/BMS to D1: 2.5 x 15 Express 2 BMS;  b. 2011 Cath: stable anatomy; c. 03/2014 Low risk MV, EF 54%;  d. 09/2016 Inf STEMI/PCI [facility]): LM nl, LAD 32m, D1 nl w/ 90% in 36mm inf branch, LCX nl, RCA 99 (Synergy DES x 4 - 3.5x28 prox, 3.5x79m, 2.5x16d, 2.25x16 to RPDA), EF 55%.  Marland Kitchen GERD (gastroesophageal reflux disease)   . Hyperlipidemia   . Hypertensive heart disease   . Myocardial infarction ([place]) 2017  . Pneumonia   . PTSD (post-traumatic stress disorder)     Past Surgical History:  Procedure Laterality Date  . CARDIAC CATHETERIZATION  05/17/2010   left main free of significant lesion; LAD with 60% lesion in mid segment & 60-70% lesion in mid-distal segment with subtotal occlusion at apex & small side branch of diagonal 1 that is subtotally occluded; ramus intermedius with 90% ostial stenosis; L Cfx w/mild luminal irreg; RCA is dominant vessel with  diffuse disease, 50% prox lesion & 60% lesion in mid segment & 60% lesion in PDA  . CATARACT EXTRACTION W/PHACO Right 06/07/2015   Procedure: CATARACT EXTRACTION PHACO AND INTRAOCULAR LENS PLACEMENT (IOC);  Surgeon: Marylynn Pearson, MD;  Location: [place];  Service: Ophthalmology;  Laterality: Right;  . COLONOSCOPY  02/25/2012   Procedure: COLONOSCOPY;  Surgeon: Jamesetta So, MD;  [facility];  Service: Gastroenterology;  Laterality: N/A;  . CORONARY ANGIOPLASTY WITH STENT PLACEMENT  09/2001   Taxus (2.5x49mm) stent to 1st diagonal   . ESOPHAGEAL DILATION N/A 08/14/2016   Procedure: ESOPHAGEAL DILATION;  Surgeon: Rogene Houston, MD;  [facility];  Service: Endoscopy;  Laterality: N/A;  . ESOPHAGOGASTRODUODENOSCOPY  02/25/2012   Procedure: ESOPHAGOGASTRODUODENOSCOPY (EGD);  Surgeon: Jamesetta So, MD;  [facility];  Service: Gastroenterology;  Laterality: N/A;  . ESOPHAGOGASTRODUODENOSCOPY N/A 07/28/2014   Procedure: ESOPHAGOGASTRODUODENOSCOPY (EGD);  Surgeon: Rogene Houston, MD;  [facility];  Service: Endoscopy;  Laterality: N/A;  200-rescheduled to [place] notified pt  . ESOPHAGOGASTRODUODENOSCOPY N/A 08/14/2016   Procedure: ESOPHAGOGASTRODUODENOSCOPY (EGD);  Surgeon: Rogene Houston, MD;  [facility];  Service: Endoscopy;  Laterality: N/A;  100  . EYE SURGERY Bilateral    lasik surgery   .  KNEE ARTHROSCOPY WITH MEDIAL MENISECTOMY Left 02/05/2018   Procedure: LEFT KNEE ARTHROSCOPY WITH PARTIAL MEDIAL MENISECTOMY;  Surgeon: Carole Civil, MD;  Location: [facility];  Service: Orthopedics;  Laterality: Left;  . KNEE SURGERY    . LATERAL EPICONDYLE RELEASE Left 01/21/2014   Procedure: Left Tennis Elbow Release with debridement  tendon repair with reattachment;  Surgeon: Carole Civil, MD;  Location: [facility];  Service: Orthopedics;  Laterality: Left;  . penile implant    . PROSTATE SURGERY    . SHOULDER SURGERY    . TONSILLECTOMY    .  TOTAL KNEE ARTHROPLASTY Left 10/19/2018   Procedure: LEFT TOTAL KNEE ARTHROPLASTY;  Surgeon: Vickey Huger, MD;  Location: [facility];  Service: Orthopedics;  Laterality: Left;  with abductor block failed spinal  . TRANSTHORACIC ECHOCARDIOGRAM  03/2010   EF=>55%; LV mildly dilated; LA mod dilated; mild MR & TR; normal RVSP; mild pulm valve regurg    There were no vitals filed for this visit.  Subjective Assessment - 11/12/18 1608    Subjective  Patient denied pain at this session. Patient reported that his home exercises are going well.     Patient is accompained by:  Family member   Wife in waiting area   Pertinent History  Left TKA 10/19/18    Limitations  Standing;Walking;House hold activities    How long can you stand comfortably?  1 minute    How long can you walk comfortably?  4-5 minutes    Patient Stated Goals  Get the knee moving better    Currently in Pain?  No/denies                       [facility] Adult PT Treatment/Exercise - 11/12/18 0001      Knee/Hip Exercises: Stretches   Passive Hamstring Stretch  Left;3 reps;30 seconds    Passive Hamstring Stretch Limitations  On 12 inch step    Hip Flexor Stretch  Left;3 reps;20 seconds    Hip Flexor Stretch Limitations  Supine with rope    Knee: Self-Stretch to increase Flexion  Left;10 seconds;Other (comment)   10 repetitions on 12 inch step   Gastroc Stretch  Both;3 reps;30 seconds    Gastroc Stretch Limitations  On slant board      Knee/Hip Exercises: Standing   Heel Raises  Both;1 set;10 reps;2 seconds    Heel Raises Limitations  1x10 2'' holds toe raises    Terminal Knee Extension  Strengthening;Left;1 set;AROM;10 reps;Theraband    Theraband Level (Terminal Knee Extension)  Level 2 (Red)    Terminal Knee Extension Limitations  Red theraband 10 x 10''     Rocker Board  2 minutes    Rocker Board Limitations  lateral with bilateral HHA      Knee/Hip Exercises: Supine   Quad Sets  AROM;Strengthening;Left;10  reps;2 sets    Short Arc Quad Sets  AROM;Strengthening;Left;10 reps;2 sets    Knee Extension  AROM    Knee Extension Limitations  5    Knee Flexion  AROM    Knee Flexion Limitations  92      Manual Therapy   Manual Therapy  Edema management;Joint mobilization    Manual therapy comments  all manual completed separately from other skilled interventions    Edema Management  Retrodgrade massage to the left lower extremity with bilateral lower extremities elevated to decrease edema    Joint Mobilization  Left patellofemoral joint mobilizations grade IV in all directions; PA/AP  grade IV 3x30 second oscillations of tibiofemoral joint             PT Education - 11/12/18 1608    Education Details  Discussed purpose and technique of interventions throughout session.     Person(s) Educated  Patient    Methods  Explanation;Demonstration    Comprehension  Verbalized understanding       PT Short Term Goals - 10/27/18 1315      PT SHORT TERM GOAL #1   Title  Patient will demonstrate understanding and report regular compliance with HEP to improve knee AROM, lower extremity strength, and overall functional mobility.     Time  3    Period  Weeks    Status  On-going      PT SHORT TERM GOAL #2   Title  Patient will demonstrate left knee extension/flexion active range of motion of at least 5-95 degrees to assist with more normalized gait pattern and stair ambulation.    Time  3    Period  Weeks    Status  On-going      PT SHORT TERM GOAL #3   Title  Patient will demonstrate improvement of  MMT grade in all musculature tested as deficient at evaluation to assist with improvement in gait mechanics and stair ambulation.     Time  3    Period  Weeks    Status  On-going      PT SHORT TERM GOAL #4   Title  Patient will perform single limb stance on left/right lower extremity for 3 seconds in order to assist with stair ambulation.    Time  3    Period  Weeks    Status  On-going         PT Long Term Goals - 10/27/18 1317      PT LONG TERM GOAL #1   Title  Patient will improve ROM for left knee extension/flexion to 0-115 degrees to improve squatting, and other functional mobility.    Time  6    Period  Weeks    Status  On-going      PT LONG TERM GOAL #2   Title  Patient will demonstrate improvement of 1 MMT grade in all musculature tested as deficient at evaluation to assist with improvement in gait mechanics and stair ambulation.     Time  6    Period  Weeks    Status  On-going      PT LONG TERM GOAL #3   Title  Patient will demonstrate ability to perform 10 repetitions on the 30 second chair rise indicating improved balance and functional strength.     Time  6    Period  Weeks    Status  On-going      PT LONG TERM GOAL #4   Title  Patient will report ability to ambulate for 10 minutes with least restrictive assistive device with no greater than 3/10 pain for improved ease of performing household activities.    Time  6    Period  Weeks    Status  On-going      PT LONG TERM GOAL #5   Title  Patient will demonstrate ability to ambulate at a gait velocity of at least 0.8 m/s with LRAD on 2MWT indicating improved ability to ambulate safely household and limited community distances.     Time  6    Period  Weeks    Status  On-going  Plan - 11/12/18 1708    Clinical Impression Statement  This session continued with established plan of care. Held re-assessment until next session to be closer to when patient will be seeing his physician. This session patient demonstrated left knee AROM from 5 to 92 degrees. This session added PA/AP glides of tibiofemoral joint to improve patient's knee flexion and extension. Plan to re-assess patient at the next visit.     Rehab Potential  Good    Clinical Impairments Affecting Rehab Potential  Positive: Motivated; Negative: acute    PT Frequency  2x / week    PT Duration  6 weeks    PT Treatment/Interventions   ADLs/Self Care Home Management;Aquatic Therapy;Cryotherapy;Electrical Stimulation;Moist Heat;DME Instruction;Gait training;Stair training;Functional mobility training;Therapeutic activities;Therapeutic exercise;Balance training;Neuromuscular re-education;Patient/family education;Orthotic Fit/Training;Manual techniques;Scar mobilization;Passive range of motion;Dry needling;Energy conservation;Taping    PT Next Visit Plan  Re-assess next session. Add bike in upcoming sessions as able. Focus on ROM and edema reduction initially and then progress to strengthening. Manual therapy to decrease edema.     PT Home Exercise Plan  10/22/18: Quad sets, heel slides, SAQ, 2x10 1x/day; ankle pumps 2 x 30 1x/day; 11/03/18: LAQ 2 x 10 5'' holds; 11/10/18: Heel/toe raises x10, hip abduction x 10 2x/day    Consulted and Agree with Plan of Care  Patient       Patient will benefit from skilled therapeutic intervention in order to improve the following deficits and impairments:  Abnormal gait, Decreased balance, Decreased endurance, Decreased mobility, Difficulty walking, Hypomobility, Decreased range of motion, Decreased scar mobility, Increased edema, Decreased activity tolerance, Decreased strength, Impaired flexibility, Pain  Visit Diagnosis: Acute pain of left knee  Stiffness of left knee, not elsewhere classified  Muscle weakness (generalized)  Other abnormalities of gait and mobility     Problem List Patient Active Problem List   Diagnosis Date Noted  . 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 S/P left knee arthroscopy 02/05/18 02/12/2018  . Ischemic cardiomyopathy 11/18/2017  . Acute pain of left knee 01/24/2017  . NSTEMI (non-ST elevated myocardial infarction) ([place]) 01/08/2017  . ST elevation myocardial infarction (STEMI) of inferior wall, subsequent episode of care ([place]) 01/08/2017  . Mixed hyperlipidemia 01/08/2017  . Sinus bradycardia 01/08/2017  . Left tennis elbow 11/08/2013  . Stable angina  ([place]) 11/08/2013  . CAD in native artery 11/08/2013  . PTSD (post-traumatic stress disorder) 11/08/2013  . Essential hypertension 11/08/2013  . Dyslipidemia 11/08/2013  . Fracture of phalanx of thumb 12/03/2011   Clarene Critchley PT, DPT 5:10 PM, 11/12/18 [place], [place], [address] Phone: [phone]   Fax:  [phone]  Name: Cory [name] MRN: [id number] Date of Birth: 28-May-1946

## 2018-11-17 ENCOUNTER — Encounter (HOSPITAL_COMMUNITY): Payer: Self-pay | Admitting: Physical Therapy

## 2018-11-17 ENCOUNTER — Ambulatory Visit (HOSPITAL_COMMUNITY): Payer: No Typology Code available for payment source | Admitting: Physical Therapy

## 2018-11-17 DIAGNOSIS — M6281 Muscle weakness (generalized): Secondary | ICD-10-CM

## 2018-11-17 DIAGNOSIS — M25562 Pain in left knee: Secondary | ICD-10-CM | POA: Diagnosis not present

## 2018-11-17 DIAGNOSIS — R2689 Other abnormalities of gait and mobility: Secondary | ICD-10-CM

## 2018-11-17 DIAGNOSIS — M25662 Stiffness of left knee, not elsewhere classified: Secondary | ICD-10-CM

## 2018-11-17 NOTE — Therapy (Addendum)
[place], [place], [address] Phone: [phone]   Fax:  [phone]  Physical Therapy Treatment / Re-assessment   Patient Details  Name: Cory [name] MRN: [id number] Date of Birth: 22-Jun-1946 Referring Provider (PT): Irving Shows, MD   Encounter Date: 11/17/2018  PT End of Session - 11/17/18 1551    Visit Number  7    Number of Visits  13    Date for PT Re-Evaluation  12/03/18   Mini-reassess 11/12/18   Authorization Type  Primary: [facility] based on medical necessity limited to 4 modalities per visit; Secondary[employer]ad[employer]    Authorization Time Period  10/22/18 - 12/03/18    Authorization - Visit Number  7    Authorization - Number of Visits  10    PT Start Time  1443    PT Stop Time  1624    PT Time Calculation (min)  39 min    Activity Tolerance  Patient tolerated treatment well    Behavior During Therapy  WFL for tasks assessed/performed       Past Medical History:  Diagnosis Date  . Anemia   . Arthritis   . Coronary artery disease    a. 09/2001 PCI/BMS to D1: 2.5 x 15 Express 2 BMS;  b. 2011 Cath: stable anatomy; c. 03/2014 Low risk MV, EF 54%;  d. 09/2016 Inf STEMI/PCI [facility]): LM nl, LAD 29m D1 nl w/ 90% in 125minf branch, LCX nl, RCA 99 (Synergy DES x 4 - 3.5x28 prox, 3.5x3837m.5x16d, 2.25x16 to RPDA), EF 55%.  . GMarland KitchenRD (gastroesophageal reflux disease)   . Hyperlipidemia   . Hypertensive heart disease   . [place]  . Pneumonia   . PTSD (post-traumatic stress disorder)     Past Surgical History:  Procedure Laterality Date  . CARDIAC CATHETERIZATION  05/17/2010   left main free of significant lesion; LAD with 60% lesion in mid segment & 60-70% lesion in mid-distal segment with subtotal occlusion at apex & small side branch of diagonal 1 that is subtotally occluded; ramus intermedius with 90% ostial stenosis; L Cfx w/mild luminal irreg; RCA is  dominant vessel with diffuse disease, 50% prox lesion & 60% lesion in mid segment & 60% lesion in PDA  . CATARACT EXTRACTION W/PHACO Right 06/07/2015   Procedure: CATARACT EXTRACTION PHACO AND INTRAOCULAR LENS PLACEMENT (IOC);  Surgeon: RoyMarylynn PearsonD;  [facility];  Laterality: Right;  . COLONOSCOPY  02/25/2012   Procedure: COLONOSCOPY;  Surgeon: MarJamesetta SoD;  [facility];  Service: Gastroenterology;  Laterality: N/A;  . CORONARY ANGIOPLASTY WITH STENT PLACEMENT  09/2001   [place] (2.5x15m17mtent to 1st diagonal   . ESOPHAGEAL DILATION N/A 08/14/2016   Procedure: ESOPHAGEAL DILATION;  Surgeon: NajeRogene Houston;  [facility];  Service: Endoscopy;  Laterality: N/A;  . ESOPHAGOGASTRODUODENOSCOPY  02/25/2012   Procedure: ESOPHAGOGASTRODUODENOSCOPY (EGD);  Surgeon: MarkJamesetta So;  [facility];  Service: Gastroenterology;  Laterality: N/A;  . ESOPHAGOGASTRODUODENOSCOPY N/A 07/28/2014   Procedure: ESOPHAGOGASTRODUODENOSCOPY (EGD);  Surgeon: NajeRogene Houston;  [facility];  Service: Endoscopy;  Laterality: N/A;  200-rescheduled to 245 Moraified pt  . ESOPHAGOGASTRODUODENOSCOPY N/A 08/14/2016   Procedure: ESOPHAGOGASTRODUODENOSCOPY (EGD);  Surgeon: NajeRogene Houston;  [facility];  Service: Endoscopy;  Laterality: N/A;  100  . EYE SURGERY Bilateral  lasik surgery   . KNEE ARTHROSCOPY WITH MEDIAL MENISECTOMY Left 02/05/2018   Procedure: LEFT KNEE ARTHROSCOPY WITH PARTIAL MEDIAL MENISECTOMY;  Surgeon: Carole Civil, MD;  Location: [facility];  Service: Orthopedics;  Laterality: Left;  . KNEE SURGERY    . LATERAL EPICONDYLE RELEASE Left 01/21/2014   Procedure: Left Tennis Elbow Release with debridement  tendon repair with reattachment;  Surgeon: Carole Civil, MD;  Location: [facility];  Service: Orthopedics;  Laterality: Left;  . penile implant    . PROSTATE SURGERY    . SHOULDER SURGERY    .  TONSILLECTOMY    . TOTAL KNEE ARTHROPLASTY Left 10/19/2018   Procedure: LEFT TOTAL KNEE ARTHROPLASTY;  Surgeon: Vickey Huger, MD;  Location: [facility];  Service: Orthopedics;  Laterality: Left;  with abductor block failed spinal  . TRANSTHORACIC ECHOCARDIOGRAM  03/2010   EF=>55%; LV mildly dilated; LA mod dilated; mild MR & TR; normal RVSP; mild pulm valve regurg    There were no vitals filed for this visit.  Subjective Assessment - 11/17/18 1549    Subjective  Patient reported that his knee is aching today. He stated he's been doing his exercises at home.     Patient is accompained by:  --    Pertinent History  Left TKA 10/19/18    Limitations  Standing;Walking;House hold activities    How long can you stand comfortably?  Not limited    How long can you walk comfortably?  10 minutes    Patient Stated Goals  Get the knee moving better    Currently in Pain?  Yes    Pain Score  5     Pain Location  Knee    Pain Orientation  Left    Pain Descriptors / Indicators  Aching    Pain Type  Surgical pain         [facility] PT Assessment - 11/17/18 0001      Assessment   Medical Diagnosis  S/P Left TKA    Referring Provider (PT)  Irving Shows, MD      Observation/Other Assessments   Focus on Therapeutic Outcomes (FOTO)   52% (48% limited)   Was 28% (72% limited)     Circumferential Edema   Circumferential - Right  14 inches at joint line    Circumferential - Left   15.5 inches at joint line      AROM   Right Knee Extension  0    Right Knee Flexion  140    Left Knee Extension  5   was 10   Left Knee Flexion  99   was 62     Strength   Right Hip Flexion  4+/5   was 4   Right Hip Extension  3/5   was 3-   Right Hip ABduction  3/5   was 3-   Left Hip Flexion  4+/5   was 2   Left Hip Extension  3/5   was 3-   Left Hip ABduction  3/5   was 3-   Right Knee Flexion  5/5    Right Knee Extension  5/5    Left Knee Flexion  4/5   was 3+   Left Knee Extension  4/5   ws 3-   Right  Ankle Dorsiflexion  5/5   was 5   Left Ankle Dorsiflexion  5/5   was 4+     Palpation   Palpation comment  Noted muscular tightness through left hamstrings with  medial more tight than lateral       Ambulation/Gait   Ambulation/Gait  Yes    Gait velocity  0.97 m/s      Static Standing Balance   Static Standing - Balance Support  No upper extremity supported    Static Standing Balance -  Activities   Single Leg Stance - Right Leg;Single Leg Stance - Left Leg    Static Standing - Comment/# of Minutes  3 seconds each lower extremity      Standardized Balance Assessment   Five times sit to stand comments   30 second chair rise: 12 sit to stands without upper extremity assistance from standard height chair                   [facility] Adult PT Treatment/Exercise - 11/17/18 0001      Ambulation/Gait   Ambulation Distance (Feet)  380 Feet   2MWT   Assistive device  None    Gait Pattern  Decreased hip/knee flexion - left;Left flexed knee in stance;Decreased stance time - left;Decreased stride length;Decreased weight shift to left;Step-through pattern    Ambulation Surface  Level;Indoor      Exercises   Exercises  Knee/Hip      Knee/Hip Exercises: Standing   Forward Step Up  Left;1 set;10 reps;Step Height: 4";Hand Hold: 2      Knee/Hip Exercises: Supine   Short Arc Quad Sets  AROM;Strengthening;Left;10 reps;2 sets    Heel Slides  AROM;Strengthening;Left    Heel Slides Limitations  2x10     Knee Extension  AROM    Knee Extension Limitations  5    Knee Flexion  AROM    Knee Flexion Limitations  99             PT Education - 11/17/18 1550    Education Details  Re-assessment findings.    Person(s) Educated  Patient    Methods  Explanation    Comprehension  Verbalized understanding       PT Short Term Goals - 11/17/18 1634      PT SHORT TERM GOAL #1   Title  Patient will demonstrate understanding and report regular compliance with HEP to improve knee AROM,  lower extremity strength, and overall functional mobility.     Time  3    Period  Weeks    Status  Achieved      PT SHORT TERM GOAL #2   Title  Patient will demonstrate left knee extension/flexion active range of motion of at least 5-95 degrees to assist with more normalized gait pattern and stair ambulation.    Time  3    Period  Weeks    Status  Achieved      PT SHORT TERM GOAL #3   Title  Patient will demonstrate improvement of  MMT grade in all musculature tested as deficient at evaluation to assist with improvement in gait mechanics and stair ambulation.     Time  3    Period  Weeks    Status  Achieved      PT SHORT TERM GOAL #4   Title  Patient will perform single limb stance on left/right lower extremity for 3 seconds in order to assist with stair ambulation.    Time  3    Period  Weeks    Status  Achieved        PT Long Term Goals - 11/17/18 1635      PT LONG TERM GOAL #1  Title  Patient will improve ROM for left knee extension/flexion to 0-115 degrees to improve squatting, and other functional mobility.    Baseline  11/17/18: Patient's left knee AROM 5-99 degrees    Time  6    Period  Weeks    Status  On-going      PT LONG TERM GOAL #2   Title  Patient will demonstrate improvement of 1 MMT grade in all musculature tested as deficient at evaluation to assist with improvement in gait mechanics and stair ambulation.     Baseline  11/17/18: See objective measures    Time  6    Period  Weeks    Status  Partially Met      PT LONG TERM GOAL #3   Title  Patient will demonstrate ability to perform 10 repetitions on the 30 second chair rise indicating improved balance and functional strength.     Baseline  11/17/18: See objective measures    Time  6    Period  Weeks    Status  Achieved      PT LONG TERM GOAL #4   Title  Patient will report ability to ambulate for 10 minutes with least restrictive assistive device with no greater than 3/10 pain for improved ease of  performing household activities.    Baseline  11/17/18: Patient reported that he felt he could do this.     Time  6    Period  Weeks    Status  Achieved      PT LONG TERM GOAL #5   Title  Patient will demonstrate ability to ambulate at a gait velocity of at least 0.8 m/s with LRAD on 2MWT indicating improved ability to ambulate safely household and limited community distances.     Baseline  11/17/18: See objective measures    Time  6    Period  Weeks    Status  Achieved            Plan - 11/17/18 1644    Clinical Impression Statement  This session performed a re-assessment of patient's progress towards goals. Patient achieved 4 out of 4 short term goals. Patient achieved 3 out of 5 long term goals. Patient has demonstrated improvements in strength, AROM, balance, and functional mobility, however patient continued to demonstrate deficits in left knee AROM and in strength. Remainder of session educated patient on findings of re-assessment and patient performed exercises focusing on improving knee AROM and functional strength. Remainder of sessions should focus on these areas.     Rehab Potential  Good    Clinical Impairments Affecting Rehab Potential  Positive: Motivated; Negative: acute    PT Frequency  2x / week    PT Duration  6 weeks    PT Treatment/Interventions  ADLs/Self Care Home Management;Aquatic Therapy;Cryotherapy;Electrical Stimulation;Moist Heat;DME Instruction;Gait training;Stair training;Functional mobility training;Therapeutic activities;Therapeutic exercise;Balance training;Neuromuscular re-education;Patient/family education;Orthotic Fit/Training;Manual techniques;Scar mobilization;Passive range of motion;Dry needling;Energy conservation;Taping    PT Next Visit Plan  Hamstring soft tissue mobiliazation. Add bike in upcoming sessions as able. Focus on ROM and edema reduction initially and then progress to strengthening. Manual therapy to decrease edema. Follow-up on  physician appointment.     PT Home Exercise Plan  10/22/18: Quad sets, heel slides, SAQ, 2x10 1x/day; ankle pumps 2 x 30 1x/day; 11/03/18: LAQ 2 x 10 5'' holds; 11/10/18: Heel/toe raises x10, hip abduction x 10 2x/day    Consulted and Agree with Plan of Care  Patient       Patient  will benefit from skilled therapeutic intervention in order to improve the following deficits and impairments:  Abnormal gait, Decreased balance, Decreased endurance, Decreased mobility, Difficulty walking, Hypomobility, Decreased range of motion, Decreased scar mobility, Increased edema, Decreased activity tolerance, Decreased strength, Impaired flexibility, Pain  Visit Diagnosis: Acute pain of left knee  Stiffness of left knee, not elsewhere classified  Muscle weakness (generalized)  Other abnormalities of gait and mobility     Problem List Patient Active Problem List   Diagnosis Date Noted  . 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 S/P left knee arthroscopy 02/05/18 02/12/2018  . Ischemic cardiomyopathy 11/18/2017  . Acute pain of left knee 01/24/2017  . NSTEMI (non-ST elevated myocardial infarction) ([place]) 01/08/2017  . ST elevation myocardial infarction (STEMI) of inferior wall, subsequent episode of care ([place]) 01/08/2017  . Mixed hyperlipidemia 01/08/2017  . Sinus bradycardia 01/08/2017  . Left tennis elbow 11/08/2013  . Stable angina ([place]) 11/08/2013  . CAD in native artery 11/08/2013  . PTSD (post-traumatic stress disorder) 11/08/2013  . Essential hypertension 11/08/2013  . Dyslipidemia 11/08/2013  . Fracture of phalanx of thumb 12/03/2011   Clarene Critchley PT, DPT 4:47 PM, 11/17/18 [place], [place], [address] Phone: [phone]   Fax:  [phone]  Name: MATTHEW CINA MRN: [id number] Date of Birth: Mar 28, 1946

## 2018-11-18 ENCOUNTER — Ambulatory Visit (INDEPENDENT_AMBULATORY_CARE_PROVIDER_SITE_OTHER): Payer: Medicare HMO | Admitting: Internal Medicine

## 2018-11-19 ENCOUNTER — Ambulatory Visit (HOSPITAL_COMMUNITY): Payer: No Typology Code available for payment source | Admitting: Physical Therapy

## 2018-11-19 ENCOUNTER — Encounter (HOSPITAL_COMMUNITY): Payer: Self-pay | Admitting: Physical Therapy

## 2018-11-19 DIAGNOSIS — M25562 Pain in left knee: Secondary | ICD-10-CM

## 2018-11-19 DIAGNOSIS — R2689 Other abnormalities of gait and mobility: Secondary | ICD-10-CM

## 2018-11-19 DIAGNOSIS — M25662 Stiffness of left knee, not elsewhere classified: Secondary | ICD-10-CM

## 2018-11-19 DIAGNOSIS — M6281 Muscle weakness (generalized): Secondary | ICD-10-CM

## 2018-11-19 NOTE — Therapy (Signed)
[place], [place], [address] Phone: [phone]   Fax:  [phone]  Physical Therapy Treatment  Patient Details  Name: Cory [name] MRN: [id number] Date of Birth: 1946-07-22 Referring Provider (PT): Irving Shows, MD   Encounter Date: 11/19/2018  PT End of Session - 11/19/18 1655    Visit Number  8    Number of Visits  13    Date for PT Re-Evaluation  12/03/18   Mini-reassess 11/12/18   Authorization Type  Primary: [facility] based on medical necessity limited to 4 modalities per visit; [employer] [employer] HMO    Authorization Time Period  10/22/18 - 12/03/18    Authorization - Visit Number  8    Authorization - Number of Visits  10    PT Start Time  1602    PT Stop Time  1646   4 minutes unbilled for time on bicycle   PT Time Calculation (min)  44 min    Activity Tolerance  Patient tolerated treatment well    Behavior During Therapy  WFL for tasks assessed/performed       Past Medical History:  Diagnosis Date  . Anemia   . Arthritis   . Coronary artery disease    a. 09/2001 PCI/BMS to D1: 2.5 x 15 Express 2 BMS;  b. 2011 Cath: stable anatomy; c. 03/2014 Low risk MV, EF 54%;  d. 09/2016 Inf STEMI/PCI [facility]): LM nl, LAD 79m D1 nl w/ 90% in 16minf branch, LCX nl, RCA 99 (Synergy DES x 4 - 3.5x28 prox, 3.5x3854m.5x16d, 2.25x16 to RPDA), EF 55%.  . GMarland KitchenRD (gastroesophageal reflux disease)   . Hyperlipidemia   . Hypertensive heart disease   . [place]  . Pneumonia   . PTSD (post-traumatic stress disorder)     Past Surgical History:  Procedure Laterality Date  . CARDIAC CATHETERIZATION  05/17/2010   left main free of significant lesion; LAD with 60% lesion in mid segment & 60-70% lesion in mid-distal segment with subtotal occlusion at apex & small side branch of diagonal 1 that is subtotally occluded; ramus intermedius with 90% ostial stenosis; L Cfx w/mild  luminal irreg; RCA is dominant vessel with diffuse disease, 50% prox lesion & 60% lesion in mid segment & 60% lesion in PDA  . CATARACT EXTRACTION W/PHACO Right 06/07/2015   Procedure: CATARACT EXTRACTION PHACO AND INTRAOCULAR LENS PLACEMENT (IOC);  Surgeon: RoyMarylynn PearsonD;  [facility]  Laterality: Right;  . COLONOSCOPY  02/25/2012   Procedure: COLONOSCOPY;  Surgeon: MarJamesetta SoD;  [facility];  Service: Gastroenterology;  Laterality: N/A;  . CORONARY ANGIOPLASTY WITH STENT PLACEMENT  09/2001   [place] (2.5x15m23mtent to 1st diagonal   . ESOPHAGEAL DILATION N/A 08/14/2016   Procedure: ESOPHAGEAL DILATION;  Surgeon: NajeRogene Houston;  [facility];  Service: Endoscopy;  Laterality: N/A;  . ESOPHAGOGASTRODUODENOSCOPY  02/25/2012   Procedure: ESOPHAGOGASTRODUODENOSCOPY (EGD);  Surgeon: MarkJamesetta So;  [facility];  Service: Gastroenterology;  Laterality: N/A;  . ESOPHAGOGASTRODUODENOSCOPY N/A 07/28/2014   Procedure: ESOPHAGOGASTRODUODENOSCOPY (EGD);  Surgeon: NajeRogene Houston;  [facility];  Service: Endoscopy;  Laterality: N/A;  200-rescheduled to [place]  . ESOPHAGOGASTRODUODENOSCOPY N/A 08/14/2016   Procedure: ESOPHAGOGASTRODUODENOSCOPY (EGD);  Surgeon: NajeRogene Houston;  [facility];  Service: Endoscopy;  Laterality: N/A;  100  . EYE  SURGERY Bilateral    lasik surgery   . KNEE ARTHROSCOPY WITH MEDIAL MENISECTOMY Left 02/05/2018   Procedure: LEFT KNEE ARTHROSCOPY WITH PARTIAL MEDIAL MENISECTOMY;  Surgeon: Carole Civil, MD;  Location: [facility];  Service: Orthopedics;  Laterality: Left;  . KNEE SURGERY    . LATERAL EPICONDYLE RELEASE Left 01/21/2014   Procedure: Left Tennis Elbow Release with debridement  tendon repair with reattachment;  Surgeon: Carole Civil, MD;  Location: [facility];  Service: Orthopedics;  Laterality: Left;  . penile implant    . PROSTATE SURGERY    .  SHOULDER SURGERY    . TONSILLECTOMY    . TOTAL KNEE ARTHROPLASTY Left 10/19/2018   Procedure: LEFT TOTAL KNEE ARTHROPLASTY;  Surgeon: Vickey Huger, MD;  [facility]: [facility];  Service: Orthopedics;  Laterality: Left;  with abductor block failed spinal  . TRANSTHORACIC ECHOCARDIOGRAM  03/2010   EF=>55%; LV mildly dilated; LA mod dilated; mild MR & TR; normal RVSP; mild pulm valve regurg    There were no vitals filed for this visit.  Subjective Assessment - 11/19/18 1607    Subjective  Patient stated that his physician said everything looked good and that he could start doing bicycle.     Pertinent History  Left TKA 10/19/18    Limitations  Standing;Walking;House hold activities    How long can you stand comfortably?  Not limited    How long can you walk comfortably?  10 minutes    Patient Stated Goals  Get the knee moving better    Currently in Pain?  Yes    Pain Score  5     Pain Location  Knee    Pain Orientation  Left    Pain Descriptors / Indicators  Aching    Pain Type  Surgical pain                       [facility] Adult PT Treatment/Exercise - 11/19/18 0001      Exercises   Exercises  Knee/Hip      Knee/Hip Exercises: Stretches   Passive Hamstring Stretch  Left;3 reps;30 seconds    Passive Hamstring Stretch Limitations  On 12 inch step    Knee: Self-Stretch to increase Flexion  Left;10 seconds;Other (comment)   10 reps on 12 inch step   Gastroc Stretch  Both;3 reps;30 seconds    Gastroc Stretch Limitations  On slant board      Knee/Hip Exercises: Aerobic   Stationary Bike  4 minutes full revolutions no resistance; seat 17      Knee/Hip Exercises: Standing   Knee Flexion  Left;1 set;10 reps    Knee Flexion Limitations  Bilateral HHA    Terminal Knee Extension  Strengthening;Left;1 set;AROM;10 reps;Theraband    Theraband Level (Terminal Knee Extension)  Level 2 (Red)    Terminal Knee Extension Limitations  Red theraband 10 x 10''     Forward Step Up  Left;1  set;10 reps;Step Height: 4";Hand Hold: 2      Knee/Hip Exercises: Supine   Quad Sets  AROM;Strengthening;Left;10 reps;2 sets    Short Arc Quad Sets  AROM;Strengthening;Left;10 reps;2 sets    Knee Extension  AROM    Knee Extension Limitations  8    Knee Flexion  AROM    Knee Flexion Limitations  109      Manual Therapy   Manual Therapy  Soft tissue mobilization    Manual therapy comments  all manual completed separately from other skilled interventions  Soft tissue mobilization  Patient supine with knee over bolster. Soft tissue mobilization to patient's left hamstrings and quadriceps             PT Education - 11/19/18 1655    Education Details  Emphasized importance of continuing exercises at home.     Person(s) Educated  Patient    Methods  Explanation    Comprehension  Verbalized understanding       PT Short Term Goals - 11/17/18 1634      PT SHORT TERM GOAL #1   Title  Patient will demonstrate understanding and report regular compliance with HEP to improve knee AROM, lower extremity strength, and overall functional mobility.     Time  3    Period  Weeks    Status  Achieved      PT SHORT TERM GOAL #2   Title  Patient will demonstrate left knee extension/flexion active range of motion of at least 5-95 degrees to assist with more normalized gait pattern and stair ambulation.    Time  3    Period  Weeks    Status  Achieved      PT SHORT TERM GOAL #3   Title  Patient will demonstrate improvement of  MMT grade in all musculature tested as deficient at evaluation to assist with improvement in gait mechanics and stair ambulation.     Time  3    Period  Weeks    Status  Achieved      PT SHORT TERM GOAL #4   Title  Patient will perform single limb stance on left/right lower extremity for 3 seconds in order to assist with stair ambulation.    Time  3    Period  Weeks    Status  Achieved        PT Long Term Goals - 11/17/18 1635      PT LONG TERM GOAL #1    Title  Patient will improve ROM for left knee extension/flexion to 0-115 degrees to improve squatting, and other functional mobility.    Baseline  11/17/18: Patient's left knee AROM 5-99 degrees    Time  6    Period  Weeks    Status  On-going      PT LONG TERM GOAL #2   Title  Patient will demonstrate improvement of 1 MMT grade in all musculature tested as deficient at evaluation to assist with improvement in gait mechanics and stair ambulation.     Baseline  11/17/18: See objective measures    Time  6    Period  Weeks    Status  Partially Met      PT LONG TERM GOAL #3   Title  Patient will demonstrate ability to perform 10 repetitions on the 30 second chair rise indicating improved balance and functional strength.     Baseline  11/17/18: See objective measures    Time  6    Period  Weeks    Status  Achieved      PT LONG TERM GOAL #4   Title  Patient will report ability to ambulate for 10 minutes with least restrictive assistive device with no greater than 3/10 pain for improved ease of performing household activities.    Baseline  11/17/18: Patient reported that he felt he could do this.     Time  6    Period  Weeks    Status  Achieved      PT LONG TERM GOAL #5  Title  Patient will demonstrate ability to ambulate at a gait velocity of at least 0.8 m/s with LRAD on 2MWT indicating improved ability to ambulate safely household and limited community distances.     Baseline  11/17/18: See objective measures    Time  6    Period  Weeks    Status  Achieved            Plan - 11/19/18 1700    Clinical Impression Statement  This session focused on improving patient's AROM in the left knee. Added standing knee flexion strengthening this session as well as began session with stationary bicycle to improve patient's left knee AROM. This session also performed soft tissue mobilization to patient's left quadriceps and hamstrings to promote relaxation and improve knee mobility and  decrease left hip flexion. Patient's left knee extension AROM was slightly decreased this session to 8 degrees, but knee flexion improved to 109 this session. Patient would benefit from continued skilled physical therapy in order to continue progressing towards functional goals.     Rehab Potential  Good    Clinical Impairments Affecting Rehab Potential  Positive: Motivated; Negative: acute    PT Frequency  2x / week    PT Duration  6 weeks    PT Treatment/Interventions  ADLs/Self Care Home Management;Aquatic Therapy;Cryotherapy;Electrical Stimulation;Moist Heat;DME Instruction;Gait training;Stair training;Functional mobility training;Therapeutic activities;Therapeutic exercise;Balance training;Neuromuscular re-education;Patient/family education;Orthotic Fit/Training;Manual techniques;Scar mobilization;Passive range of motion;Dry needling;Energy conservation;Taping    PT Next Visit Plan   Focus on ROM and edema reduction initially and then progress to strengthening. Continue soft tissue mobilization, joint mobilization and begin scar massage as able. Initiate contract relax.     PT Home Exercise Plan  10/22/18: Quad sets, heel slides, SAQ, 2x10 1x/day; ankle pumps 2 x 30 1x/day; 11/03/18: LAQ 2 x 10 5'' holds; 11/10/18: Heel/toe raises x10, hip abduction x 10 2x/day    Consulted and Agree with Plan of Care  Patient       Patient will benefit from skilled therapeutic intervention in order to improve the following deficits and impairments:  Abnormal gait, Decreased balance, Decreased endurance, Decreased mobility, Difficulty walking, Hypomobility, Decreased range of motion, Decreased scar mobility, Increased edema, Decreased activity tolerance, Decreased strength, Impaired flexibility, Pain  Visit Diagnosis: Acute pain of left knee  Stiffness of left knee, not elsewhere classified  Muscle weakness (generalized)  Other abnormalities of gait and mobility     Problem List Patient Active Problem  List   Diagnosis Date Noted  . 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 S/P left knee arthroscopy 02/05/18 02/12/2018  . Ischemic cardiomyopathy 11/18/2017  . Acute pain of left knee 01/24/2017  . NSTEMI (non-ST elevated myocardial infarction) ([place]) 01/08/2017  . ST elevation myocardial infarction (STEMI) of inferior wall, subsequent episode of care ([place]) 01/08/2017  . Mixed hyperlipidemia 01/08/2017  . Sinus bradycardia 01/08/2017  . Left tennis elbow 11/08/2013  . Stable angina ([place]) 11/08/2013  . CAD in native artery 11/08/2013  . PTSD (post-traumatic stress disorder) 11/08/2013  . Essential hypertension 11/08/2013  . Dyslipidemia 11/08/2013  . Fracture of phalanx of thumb 12/03/2011   Clarene Critchley PT, DPT 5:05 PM, 11/19/18 [place], [place], [address] Phone: [phone]   Fax:  [phone]  Name: Cory [name] MRN: [id number] Date of Birth: [birth year]

## 2018-11-23 ENCOUNTER — Encounter (HOSPITAL_COMMUNITY): Payer: No Typology Code available for payment source | Admitting: Physical Therapy

## 2018-11-24 ENCOUNTER — Encounter (HOSPITAL_COMMUNITY): Payer: Self-pay | Admitting: Physical Therapy

## 2018-11-24 ENCOUNTER — Ambulatory Visit (HOSPITAL_COMMUNITY): Payer: No Typology Code available for payment source | Admitting: Physical Therapy

## 2018-11-24 DIAGNOSIS — R2689 Other abnormalities of gait and mobility: Secondary | ICD-10-CM

## 2018-11-24 DIAGNOSIS — M25662 Stiffness of left knee, not elsewhere classified: Secondary | ICD-10-CM

## 2018-11-24 DIAGNOSIS — M25562 Pain in left knee: Secondary | ICD-10-CM

## 2018-11-24 DIAGNOSIS — M6281 Muscle weakness (generalized): Secondary | ICD-10-CM

## 2018-11-24 NOTE — Therapy (Signed)
[place], [place], [address] Phone: [phone]   Fax:  [phone]  Physical Therapy Treatment  Patient Details  Name: Cory [name] MRN: [id number] Date of Birth: 25-Jan-1946 Referring Provider (PT): Irving Shows, MD   Encounter Date: 11/24/2018  PT End of Session - 11/24/18 1701    Visit Number  9    Number of Visits  13    Date for PT Re-Evaluation  12/03/18   Mini-reassess 11/12/18   Authorization Type  Primary: [facility] based on medical necessity limited to 4 modalities per visit; Secondary: [employer]    Authorization Time Period  10/22/18 - 12/03/18    Authorization - Visit Number  2   Updated for correct count   Authorization - Number of Visits  10    PT Start Time  1600    PT Stop Time  1646   4 minutes unbilled for bicycle   PT Time Calculation (min)  46 min    Activity Tolerance  Patient tolerated treatment well    Behavior During Therapy  WFL for tasks assessed/performed       Past Medical History:  Diagnosis Date  . Anemia   . Arthritis   . Coronary artery disease    a. 09/2001 PCI/BMS to D1: 2.5 x 15 Express 2 BMS;  b. 2011 Cath: stable anatomy; c. 03/2014 Low risk MV, EF 54%;  d. 09/2016 Inf STEMI/PCI [facility]): LM nl, LAD 47m D1 nl w/ 90% in 110minf branch, LCX nl, RCA 99 (Synergy DES x 4 - 3.5x28 prox, 3.5x3875m.5x16d, 2.25x16 to RPDA), EF 55%.  . GMarland KitchenRD (gastroesophageal reflux disease)   . Hyperlipidemia   . Hypertensive heart disease   . Myocardial infarction ([facility]017  . Pneumonia   . PTSD (post-traumatic stress disorder)     Past Surgical History:  Procedure Laterality Date  . CARDIAC CATHETERIZATION  05/17/2010   left main free of significant lesion; LAD with 60% lesion in mid segment & 60-70% lesion in mid-distal segment with subtotal occlusion at apex & small side branch of diagonal 1 that is subtotally occluded; ramus intermedius with 90% ostial  stenosis; L Cfx w/mild luminal irreg; RCA is dominant vessel with diffuse disease, 50% prox lesion & 60% lesion in mid segment & 60% lesion in PDA  . CATARACT EXTRACTION W/PHACO Right 06/07/2015   Procedure: CATARACT EXTRACTION PHACO AND INTRAOCULAR LENS PLACEMENT (IOC);  Surgeon: RoyMarylynn PearsonD;  [facility]  Laterality: Right;  . COLONOSCOPY  02/25/2012   Procedure: COLONOSCOPY;  Surgeon: MarJamesetta SoD;  [facility];  Service: Gastroenterology;  Laterality: N/A;  . CORONARY ANGIOPLASTY WITH STENT PLACEMENT  09/2001   [place]us (2.5x15m62mtent to 1st diagonal   . ESOPHAGEAL DILATION N/A 08/14/2016   Procedure: ESOPHAGEAL DILATION;  Surgeon: NajeRogene Houston;  [facility];  Service: Endoscopy;  Laterality: N/A;  . ESOPHAGOGASTRODUODENOSCOPY  02/25/2012   Procedure: ESOPHAGOGASTRODUODENOSCOPY (EGD);  Surgeon: MarkJamesetta So;  [facility];  Service: Gastroenterology;  Laterality: N/A;  . ESOPHAGOGASTRODUODENOSCOPY N/A 07/28/2014   Procedure: ESOPHAGOGASTRODUODENOSCOPY (EGD);  Surgeon: NajeRogene Houston;  [facility];  Service: Endoscopy;  Laterality: N/A;  200-rescheduled to 245 Newton[place] pt  . ESOPHAGOGASTRODUODENOSCOPY N/A 08/14/2016   Procedure: ESOPHAGOGASTRODUODENOSCOPY (EGD);  Surgeon: NajeRogene Houston;  [facility];  Service: Endoscopy;  Laterality: N/A;  100  .  EYE SURGERY Bilateral    lasik surgery   . KNEE ARTHROSCOPY WITH MEDIAL MENISECTOMY Left 02/05/2018   Procedure: LEFT KNEE ARTHROSCOPY WITH PARTIAL MEDIAL MENISECTOMY;  Surgeon: Carole Civil, MD;  Location: [facility];  Service: Orthopedics;  Laterality: Left;  . KNEE SURGERY    . LATERAL EPICONDYLE RELEASE Left 01/21/2014   Procedure: Left Tennis Elbow Release with debridement  tendon repair with reattachment;  Surgeon: Carole Civil, MD;  Location: [facility];  Service: Orthopedics;  Laterality: Left;  . penile implant    .  PROSTATE SURGERY    . SHOULDER SURGERY    . TONSILLECTOMY    . TOTAL KNEE ARTHROPLASTY Left 10/19/2018   Procedure: LEFT TOTAL KNEE ARTHROPLASTY;  Surgeon: Vickey Huger, MD;  Location: [facility];  Service: Orthopedics;  Laterality: Left;  with abductor block failed spinal  . TRANSTHORACIC ECHOCARDIOGRAM  03/2010   EF=>55%; LV mildly dilated; LA mod dilated; mild MR & TR; normal RVSP; mild pulm valve regurg    There were no vitals filed for this visit.  Subjective Assessment - 11/24/18 1603    Subjective  Patient stated he is feeling great today. He denied any pain.     Pertinent History  Left TKA 10/19/18    Limitations  Standing;Walking;House hold activities    How long can you stand comfortably?  Not limited    How long can you walk comfortably?  10 minutes    Patient Stated Goals  Get the knee moving better    Currently in Pain?  No/denies                       [facility] Adult PT Treatment/Exercise - 11/24/18 0001      Exercises   Exercises  Knee/Hip      Knee/Hip Exercises: Stretches   Passive Hamstring Stretch  Left;3 reps;30 seconds    Passive Hamstring Stretch Limitations  On 12 inch step    Hip Flexor Stretch  Left;3 reps;30 seconds    Hip Flexor Stretch Limitations  Supine with rope    Knee: Self-Stretch to increase Flexion  Left;10 seconds;Other (comment)   10 reps; 12-inch step   Gastroc Stretch  Both;3 reps;30 seconds    Gastroc Stretch Limitations  On slant board      Knee/Hip Exercises: Aerobic   Stationary Bike  4 minutes full revolutions no resistance; seat 16      Knee/Hip Exercises: Standing   Knee Flexion  Left;1 set;10 reps    Knee Flexion Limitations  Bilateral HHA    Terminal Knee Extension  Strengthening;Left;1 set;AROM;10 reps    Terminal Knee Extension Limitations  Pink ball into the wall 10x10 second holds    Lateral Step Up  Left;1 set;10 reps;Hand Hold: 2;Step Height: 4"    Forward Step Up  Left;1 set;10 reps;Step Height: 4";Hand Hold:  2    Step Down  1 set;10 reps;Hand Hold: 2;Step Height: 4"      Knee/Hip Exercises: Supine   Quad Sets  AROM;Strengthening;Left;10 reps;2 sets    Short Arc Quad Sets  AROM;Strengthening;Left;10 reps;2 sets    Knee Extension  AROM    Knee Extension Limitations  8    Knee Flexion  AROM    Knee Flexion Limitations  116      Manual Therapy   Manual Therapy  Soft tissue mobilization;Muscle Energy Technique    Manual therapy comments  all manual completed separately from other skilled interventions    Joint Mobilization  Left tibiofemoral PA and AP glides grade III-IV 3x30'' oscillations to improve left knee extension and flexion    Soft tissue mobilization  --    Muscle Energy Technique  Contract relax to increase knee flexion and extension 10 x 10'' holds             PT Education - 11/24/18 1700    Education Details  Updated HEP.     Person(s) Educated  Patient    Methods  Explanation    Comprehension  Verbalized understanding       PT Short Term Goals - 11/17/18 1634      PT SHORT TERM GOAL #1   Title  Patient will demonstrate understanding and report regular compliance with HEP to improve knee AROM, lower extremity strength, and overall functional mobility.     Time  3    Period  Weeks    Status  Achieved      PT SHORT TERM GOAL #2   Title  Patient will demonstrate left knee extension/flexion active range of motion of at least 5-95 degrees to assist with more normalized gait pattern and stair ambulation.    Time  3    Period  Weeks    Status  Achieved      PT SHORT TERM GOAL #3   Title  Patient will demonstrate improvement of  MMT grade in all musculature tested as deficient at evaluation to assist with improvement in gait mechanics and stair ambulation.     Time  3    Period  Weeks    Status  Achieved      PT SHORT TERM GOAL #4   Title  Patient will perform single limb stance on left/right lower extremity for 3 seconds in order to assist with stair ambulation.     Time  3    Period  Weeks    Status  Achieved        PT Long Term Goals - 11/17/18 1635      PT LONG TERM GOAL #1   Title  Patient will improve ROM for left knee extension/flexion to 0-115 degrees to improve squatting, and other functional mobility.    Baseline  11/17/18: Patient's left knee AROM 5-99 degrees    Time  6    Period  Weeks    Status  On-going      PT LONG TERM GOAL #2   Title  Patient will demonstrate improvement of 1 MMT grade in all musculature tested as deficient at evaluation to assist with improvement in gait mechanics and stair ambulation.     Baseline  11/17/18: See objective measures    Time  6    Period  Weeks    Status  Partially Met      PT LONG TERM GOAL #3   Title  Patient will demonstrate ability to perform 10 repetitions on the 30 second chair rise indicating improved balance and functional strength.     Baseline  11/17/18: See objective measures    Time  6    Period  Weeks    Status  Achieved      PT LONG TERM GOAL #4   Title  Patient will report ability to ambulate for 10 minutes with least restrictive assistive device with no greater than 3/10 pain for improved ease of performing household activities.    Baseline  11/17/18: Patient reported that he felt he could do this.     Time  6    Period  Weeks    Status  Achieved      PT LONG TERM GOAL #5   Title  Patient will demonstrate ability to ambulate at a gait velocity of at least 0.8 m/s with LRAD on 2MWT indicating improved ability to ambulate safely household and limited community distances.     Baseline  11/17/18: See objective measures    Time  6    Period  Weeks    Status  Achieved            Plan - 11/24/18 1623    Clinical Impression Statement  This session continued with established plan of care. This session had patient perform terminal knee extension pressing a ball into the wall. This session also initiated contract relax with manual therapy in order to promote muscular  relaxation and improve patient's left knee mobility. This session progressed patient to performing lateral step ups as well as step downs.  This session patient's left knee AROM ranged from 8 degrees extension to 116 degrees flexion. Patient would benefit from continued skilled physical therapy in order to continue progressing patient towards functional goals.     Rehab Potential  Good    Clinical Impairments Affecting Rehab Potential  Positive: Motivated; Negative: acute    PT Frequency  2x / week    PT Duration  6 weeks    PT Treatment/Interventions  ADLs/Self Care Home Management;Aquatic Therapy;Cryotherapy;Electrical Stimulation;Moist Heat;DME Instruction;Gait training;Stair training;Functional mobility training;Therapeutic activities;Therapeutic exercise;Balance training;Neuromuscular re-education;Patient/family education;Orthotic Fit/Training;Manual techniques;Scar mobilization;Passive range of motion;Dry needling;Energy conservation;Taping    PT Next Visit Plan   Possible Biodex to improve knee PROM, begin scar mobilization. Increase step height at next session. Focus on ROM and edema reduction initially and then progress to strengthening. Continue soft tissue mobilization, joint mobilization and begin scar massage as able. Initiate contract relax.     PT Home Exercise Plan  10/22/18: Quad sets, heel slides, SAQ, 2x10 1x/day; ankle pumps 2 x 30 1x/day; 11/03/18: LAQ 2 x 10 5'' holds; 11/10/18: Heel/toe raises x10, hip abduction x 10 2x/day; 11/24/18: Hamstring stretch 3 x 30 seconds    Consulted and Agree with Plan of Care  Patient       Patient will benefit from skilled therapeutic intervention in order to improve the following deficits and impairments:  Abnormal gait, Decreased balance, Decreased endurance, Decreased mobility, Difficulty walking, Hypomobility, Decreased range of motion, Decreased scar mobility, Increased edema, Decreased activity tolerance, Decreased strength, Impaired  flexibility, Pain  Visit Diagnosis: Acute pain of left knee  Stiffness of left knee, not elsewhere classified  Muscle weakness (generalized)  Other abnormalities of gait and mobility     Problem List Patient Active Problem List   Diagnosis Date Noted  . 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 S/P left knee arthroscopy 02/05/18 02/12/2018  . Ischemic cardiomyopathy 11/18/2017  . Acute pain of left knee 01/24/2017  . NSTEMI (non-ST elevated myocardial infarction) ([place]) 01/08/2017  . ST elevation myocardial infarction (STEMI) of inferior wall, subsequent episode of care ([place]) 01/08/2017  . Mixed hyperlipidemia 01/08/2017  . Sinus bradycardia 01/08/2017  . Left tennis elbow 11/08/2013  . Stable angina ([place]) 11/08/2013  . CAD in native artery 11/08/2013  . PTSD (post-traumatic stress disorder) 11/08/2013  . Essential hypertension 11/08/2013  . Dyslipidemia 11/08/2013  . Fracture of phalanx of thumb 12/03/2011   Clarene Critchley PT, DPT 5:07 PM, 11/24/18 [place], [place], [address] Phone: [phone]   Fax:  [phone]  Name: MERLE CIRELLI MRN: [id number] Date of Birth: [birth year]

## 2018-11-24 NOTE — Patient Instructions (Signed)
Hamstrings    One heel on bench, straighten leg, other leg straight. Reach arms forward, keeping back straight. Hold _30___ seconds.  Repeat __3__ times. Do _1-2___ sessions per day. For greater stretch, reach over leg.  Copyright  VHI. All rights reserved.

## 2018-12-02 ENCOUNTER — Ambulatory Visit (HOSPITAL_COMMUNITY): Payer: No Typology Code available for payment source | Attending: Orthopedic Surgery | Admitting: Physical Therapy

## 2018-12-02 ENCOUNTER — Encounter (HOSPITAL_COMMUNITY): Payer: Self-pay | Admitting: Physical Therapy

## 2018-12-02 DIAGNOSIS — M25562 Pain in left knee: Secondary | ICD-10-CM | POA: Diagnosis not present

## 2018-12-02 DIAGNOSIS — M6281 Muscle weakness (generalized): Secondary | ICD-10-CM | POA: Insufficient documentation

## 2018-12-02 DIAGNOSIS — M25662 Stiffness of left knee, not elsewhere classified: Secondary | ICD-10-CM | POA: Diagnosis present

## 2018-12-02 DIAGNOSIS — R2689 Other abnormalities of gait and mobility: Secondary | ICD-10-CM

## 2018-12-02 NOTE — Therapy (Signed)
[place], [place], [address] Phone: [phone]   Fax:  [phone]  Physical Therapy Treatment  Patient Details  Name: Cory [name] MRN: [id number] Date of Birth: 06/18/1946 Referring Provider (PT): Irving Shows, MD   Encounter Date: 12/02/2018  PT End of Session - 12/02/18 1702    Visit Number  10    Number of Visits  13    Date for PT Re-Evaluation  12/03/18   Mini-reassess 11/12/18   Authorization Type  Primary: [facility] based on medical necessity limited to 4 modalities per visit; Secondary: [employer]    Authorization Time Period  10/22/18 - 12/03/18    Authorization - Visit Number  3   Updated for correct count   Authorization - Number of Visits  10    PT Start Time  3267    PT Stop Time  1736    PT Time Calculation (min)  38 min    Activity Tolerance  Patient tolerated treatment well    Behavior During Therapy  WFL for tasks assessed/performed       Past Medical History:  Diagnosis Date  . Anemia   . Arthritis   . Coronary artery disease    a. 09/2001 PCI/BMS to D1: 2.5 x 15 Express 2 BMS;  b. 2011 Cath: stable anatomy; c. 03/2014 Low risk MV, EF 54%;  d. 09/2016 Inf STEMI/PCI [facility]): LM nl, LAD 26m D1 nl w/ 90% in 121minf branch, LCX nl, RCA 99 (Synergy DES x 4 - 3.5x28 prox, 3.5x3878m.5x16d, 2.25x16 to RPDA), EF 55%.  . GMarland KitchenRD (gastroesophageal reflux disease)   . Hyperlipidemia   . Hypertensive heart disease   . Myocardial infarction ([place]  . Pneumonia   . PTSD (post-traumatic stress disorder)     Past Surgical History:  Procedure Laterality Date  . CARDIAC CATHETERIZATION  05/17/2010   left main free of significant lesion; LAD with 60% lesion in mid segment & 60-70% lesion in mid-distal segment with subtotal occlusion at apex & small side branch of diagonal 1 that is subtotally occluded; ramus intermedius with 90% ostial stenosis; L Cfx w/mild luminal irreg; RCA  is dominant vessel with diffuse disease, 50% prox lesion & 60% lesion in mid segment & 60% lesion in PDA  . CATARACT EXTRACTION W/PHACO Right 06/07/2015   Procedure: CATARACT EXTRACTION PHACO AND INTRAOCULAR LENS PLACEMENT (IOC);  Surgeon: RoyMarylynn PearsonD;  [facility]  Laterality: Right;  . COLONOSCOPY  02/25/2012   Procedure: COLONOSCOPY;  Surgeon: MarJamesetta SoD;  [facility];  Service: Gastroenterology;  Laterality: N/A;  . CORONARY ANGIOPLASTY WITH STENT PLACEMENT  09/2001   [place] (2.5x15m69mtent to 1st diagonal   . ESOPHAGEAL DILATION N/A 08/14/2016   Procedure: ESOPHAGEAL DILATION;  Surgeon: NajeRogene Houston;  [facility];  Service: Endoscopy;  Laterality: N/A;  . ESOPHAGOGASTRODUODENOSCOPY  02/25/2012   Procedure: ESOPHAGOGASTRODUODENOSCOPY (EGD);  Surgeon: MarkJamesetta So;  [facility];  Service: Gastroenterology;  Laterality: N/A;  . ESOPHAGOGASTRODUODENOSCOPY N/A 07/28/2014   Procedure: ESOPHAGOGASTRODUODENOSCOPY (EGD);  Surgeon: NajeRogene Houston;  [facility];  Service: Endoscopy;  Laterality: N/A;  200-res[place]uled to [place]  . ESOPHAGOGASTRODUODENOSCOPY N/A 08/14/2016   Procedure: ESOPHAGOGASTRODUODENOSCOPY (EGD);  Surgeon: NajeRogene Houston;  [facility];  Service: Endoscopy;  Laterality: N/A;  100  . EYE SURGERY Bilateral  lasik surgery   . KNEE ARTHROSCOPY WITH MEDIAL MENISECTOMY Left 02/05/2018   Procedure: LEFT KNEE ARTHROSCOPY WITH PARTIAL MEDIAL MENISECTOMY;  Surgeon: Carole Civil, MD;  Location: [facility];  Service: Orthopedics;  Laterality: Left;  . KNEE SURGERY    . LATERAL EPICONDYLE RELEASE Left 01/21/2014   Procedure: Left Tennis Elbow Release with debridement  tendon repair with reattachment;  Surgeon: Carole Civil, MD;  Location: [facility];  Service: Orthopedics;  Laterality: Left;  . penile implant    . PROSTATE SURGERY    . SHOULDER SURGERY    .  TONSILLECTOMY    . TOTAL KNEE ARTHROPLASTY Left 10/19/2018   Procedure: LEFT TOTAL KNEE ARTHROPLASTY;  Surgeon: Vickey Huger, MD;  Location: [facility];  Service: Orthopedics;  Laterality: Left;  with abductor block failed spinal  . TRANSTHORACIC ECHOCARDIOGRAM  03/2010   EF=>55%; LV mildly dilated; LA mod dilated; mild MR & TR; normal RVSP; mild pulm valve regurg    There were no vitals filed for this visit.  Subjective Assessment - 12/02/18 1659    Subjective  Patient denied any pain, but reported some soreness. He stated he's been trying to do his exercises at home.     Pertinent History  Left TKA 10/19/18    Limitations  Standing;Walking;House hold activities    How long can you stand comfortably?  Not limited    How long can you walk comfortably?  10 minutes    Patient Stated Goals  Get the knee moving better    Currently in Pain?  No/denies                       [facility] Adult PT Treatment/Exercise - 12/02/18 0001      Exercises   Exercises  Knee/Hip      Knee/Hip Exercises: Stretches   Passive Hamstring Stretch  Left;3 reps;30 seconds    Passive Hamstring Stretch Limitations  On 12 inch step    Hip Flexor Stretch  Left;3 reps;30 seconds    Hip Flexor Stretch Limitations  Supine with rope    Knee: Self-Stretch to increase Flexion  Left;10 seconds;Other (comment)   10 repetitions on 12-inch step   Gastroc Stretch  Both;3 reps;30 seconds    Gastroc Stretch Limitations  On slant board      Knee/Hip Exercises: Standing   Terminal Knee Extension  Strengthening;Left;1 set;AROM;10 reps    Terminal Knee Extension Limitations  Pink ball into the wall 10x10 second holds    Lateral Step Up  Left;1 set;10 reps;Step Height: 4";Hand Hold: 0    Forward Step Up  Left;1 set;10 reps;Step Height: 6";Hand Hold: 0    Step Down  1 set;10 reps;Hand Hold: 2;Step Height: 6"      Knee/Hip Exercises: Supine   Short Arc Quad Sets  AROM;Strengthening;Left;10 reps;2 sets    Knee Extension   AROM    Knee Extension Limitations  8    Knee Flexion  AROM    Knee Flexion Limitations  115      Knee/Hip Exercises: Prone   Hamstring Curl  1 set;10 reps;3 seconds      Manual Therapy   Manual Therapy  Soft tissue mobilization;Myofascial release    Manual therapy comments  all manual completed separately from other skilled interventions    Soft tissue mobilization  Patient supine. Soft tissue mobilization to patient's left hamstrings and quadriceps    Myofascial Release  Scar mobilization to improve knee mobility  PT Short Term Goals - 11/17/18 1634      PT SHORT TERM GOAL #1   Title  Patient will demonstrate understanding and report regular compliance with HEP to improve knee AROM, lower extremity strength, and overall functional mobility.     Time  3    Period  Weeks    Status  Achieved      PT SHORT TERM GOAL #2   Title  Patient will demonstrate left knee extension/flexion active range of motion of at least 5-95 degrees to assist with more normalized gait pattern and stair ambulation.    Time  3    Period  Weeks    Status  Achieved      PT SHORT TERM GOAL #3   Title  Patient will demonstrate improvement of  MMT grade in all musculature tested as deficient at evaluation to assist with improvement in gait mechanics and stair ambulation.     Time  3    Period  Weeks    Status  Achieved      PT SHORT TERM GOAL #4   Title  Patient will perform single limb stance on left/right lower extremity for 3 seconds in order to assist with stair ambulation.    Time  3    Period  Weeks    Status  Achieved        PT Long Term Goals - 11/17/18 1635      PT LONG TERM GOAL #1   Title  Patient will improve ROM for left knee extension/flexion to 0-115 degrees to improve squatting, and other functional mobility.    Baseline  11/17/18: Patient's left knee AROM 5-99 degrees    Time  6    Period  Weeks    Status  On-going      PT LONG TERM GOAL #2   Title   Patient will demonstrate improvement of 1 MMT grade in all musculature tested as deficient at evaluation to assist with improvement in gait mechanics and stair ambulation.     Baseline  11/17/18: See objective measures    Time  6    Period  Weeks    Status  Partially Met      PT LONG TERM GOAL #3   Title  Patient will demonstrate ability to perform 10 repetitions on the 30 second chair rise indicating improved balance and functional strength.     Baseline  11/17/18: See objective measures    Time  6    Period  Weeks    Status  Achieved      PT LONG TERM GOAL #4   Title  Patient will report ability to ambulate for 10 minutes with least restrictive assistive device with no greater than 3/10 pain for improved ease of performing household activities.    Baseline  11/17/18: Patient reported that he felt he could do this.     Time  6    Period  Weeks    Status  Achieved      PT LONG TERM GOAL #5   Title  Patient will demonstrate ability to ambulate at a gait velocity of at least 0.8 m/s with LRAD on 2MWT indicating improved ability to ambulate safely household and limited community distances.     Baseline  11/17/18: See objective measures    Time  6    Period  Weeks    Status  Achieved            Plan - 12/02/18 1749  Clinical Impression Statement  This session continued with established plan of care with a focus on improving patient's knee AROM. This session patient continued to demonstrate a decrease in both knee flexion and extension AROM at the end of the session. This session increased step height to 6-inches with step-ups/downs. Plan to re-assess patient at the next visit.     Rehab Potential  Good    Clinical Impairments Affecting Rehab Potential  Positive: Motivated; Negative: acute    PT Frequency  2x / week    PT Duration  6 weeks    PT Treatment/Interventions  ADLs/Self Care Home Management;Aquatic Therapy;Cryotherapy;Electrical Stimulation;Moist Heat;DME  Instruction;Gait training;Stair training;Functional mobility training;Therapeutic activities;Therapeutic exercise;Balance training;Neuromuscular re-education;Patient/family education;Orthotic Fit/Training;Manual techniques;Scar mobilization;Passive range of motion;Dry needling;Energy conservation;Taping    PT Next Visit Plan  Re-assess. Possible Biodex to improve knee PROM.  Focus on ROM and edema reduction initially and then progress to strengthening. Continue soft tissue mobilization, joint mobilization. Consider continuing contract relax.     PT Home Exercise Plan  10/22/18: Quad sets, heel slides, SAQ, 2x10 1x/day; ankle pumps 2 x 30 1x/day; 11/03/18: LAQ 2 x 10 5'' holds; 11/10/18: Heel/toe raises x10, hip abduction x 10 2x/day; 11/24/18: Hamstring stretch 3 x 30 seconds    Consulted and Agree with Plan of Care  Patient       Patient will benefit from skilled therapeutic intervention in order to improve the following deficits and impairments:  Abnormal gait, Decreased balance, Decreased endurance, Decreased mobility, Difficulty walking, Hypomobility, Decreased range of motion, Decreased scar mobility, Increased edema, Decreased activity tolerance, Decreased strength, Impaired flexibility, Pain  Visit Diagnosis: Acute pain of left knee  Stiffness of left knee, not elsewhere classified  Muscle weakness (generalized)  Other abnormalities of gait and mobility     Problem List Patient Active Problem List   Diagnosis Date Noted  . 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 S/P left knee arthroscopy 02/05/18 02/12/2018  . Ischemic cardiomyopathy 11/18/2017  . Acute pain of left knee 01/24/2017  . NSTEMI (non-ST elevated myocardial infarction) ([place]) 01/08/2017  . ST elevation myocardial infarction (STEMI) of inferior wall, subsequent episode of care ([place]) 01/08/2017  . Mixed hyperlipidemia 01/08/2017  . Sinus bradycardia 01/08/2017  . Left tennis elbow 11/08/2013  . Stable angina ([place])  11/08/2013  . CAD in native artery 11/08/2013  . PTSD (post-traumatic stress disorder) 11/08/2013  . Essential hypertension 11/08/2013  . Dyslipidemia 11/08/2013  . Fracture of phalanx of thumb 12/03/2011   Clarene Critchley PT, DPT 5:52 PM, 12/02/18 [place], [place], [address] Phone: [phone]   Fax:  [phone]  Name: Cory [name] MRN: [id number] Date of Birth: [birth year]

## 2018-12-04 ENCOUNTER — Encounter (HOSPITAL_COMMUNITY): Payer: Self-pay | Admitting: Physical Therapy

## 2018-12-04 ENCOUNTER — Ambulatory Visit (HOSPITAL_COMMUNITY): Payer: No Typology Code available for payment source | Admitting: Physical Therapy

## 2018-12-04 DIAGNOSIS — M25562 Pain in left knee: Secondary | ICD-10-CM

## 2018-12-04 DIAGNOSIS — R2689 Other abnormalities of gait and mobility: Secondary | ICD-10-CM

## 2018-12-04 DIAGNOSIS — M6281 Muscle weakness (generalized): Secondary | ICD-10-CM

## 2018-12-04 DIAGNOSIS — M25662 Stiffness of left knee, not elsewhere classified: Secondary | ICD-10-CM

## 2018-12-04 NOTE — Therapy (Signed)
[facility] [address] [place], [place], [address] Phone: [phone]   Fax:  [phone]  Physical Therapy Treatment / Re-assessment  Patient Details  Name: Cory [name] MRN: [id number] Date of Birth: 09-13-1946 Referring Provider (PT): Irving Shows, MD   Encounter Date: 12/04/2018   Progress Note Reporting Period 11/17/18 to 12/04/18  See note below for Objective Data and Assessment of Progress/Goals.       PT End of Session - 12/04/18 1604    Visit Number  11    Number of Visits  13    Date for PT Re-Evaluation  01/01/19    Authorization Type  Primary: VA based on medical necessity limited to 4 modalities per visit; Secondary: [employer] HMO    Authorization Time Period  10/22/18 - 12/03/18; New requested 12/04/18 - 01/01/19 (Check for VA approval)    Authorization - Visit Number  0    Authorization - Number of Visits  10    PT Start Time  7096    PT Stop Time  1644   4 minutes unbilled for bicycle   PT Time Calculation (min)  46 min    Activity Tolerance  Patient tolerated treatment well    Behavior During Therapy  WFL for tasks assessed/performed       Past Medical History:  Diagnosis Date  . Anemia   . Arthritis   . Coronary artery disease    a. 09/2001 PCI/BMS to D1: 2.5 x 15 Express 2 BMS;  b. 2011 Cath: stable anatomy; c. 03/2014 Low risk MV, EF 54%;  d. 09/2016 Inf STEMI/PCI [facility]): LM nl, LAD 64m D1 nl w/ 90% in 123minf branch, LCX nl, RCA 99 (Synergy DES x 4 - 3.5x28 prox, 3.5x3889m.5x16d, 2.25x16 to RPDA), EF 55%.  . GMarland KitchenRD (gastroesophageal reflux disease)   . Hyperlipidemia   . Hypertensive heart disease   . [place]ocardial infarction ([place]  . Pneumonia   . PTSD (post-traumatic stress disorder)     Past Surgical History:  Procedure Laterality Date  . CARDIAC CATHETERIZATION  05/17/2010   left main free of significant lesion; LAD with 60% lesion in mid segment & 60-70% lesion in  mid-distal segment with subtotal occlusion at apex & small side branch of diagonal 1 that is subtotally occluded; ramus intermedius with 90% ostial stenosis; L Cfx w/mild luminal irreg; RCA is dominant vessel with diffuse disease, 50% prox lesion & 60% lesion in mid segment & 60% lesion in PDA  . CATARACT EXTRACTION W/PHACO Right 06/07/2015   Procedure: CATARACT EXTRACTION PHACO AND INTRAOCULAR LENS PLACEMENT (IOC);  Surgeon: RoyMarylynn PearsonD;  [facility]  Laterality: Right;  . COLONOSCOPY  02/25/2012   Procedure: COLONOSCOPY;  Surgeon: MarJamesetta SoD;  [facility];  Service: Gastroenterology;  Laterality: N/A;  . CORONARY ANGIOPLASTY WITH STENT PLACEMENT  09/2001   [place] (2.5x15m79mtent to 1st diagonal   . ESOPHAGEAL DILATION N/A 08/14/2016   Procedure: ESOPHAGEAL DILATION;  Surgeon: NajeRogene Houston;  [facility];  Service: Endoscopy;  Laterality: N/A;  . ESOPHAGOGASTRODUODENOSCOPY  02/25/2012   Procedure: ESOPHAGOGASTRODUODENOSCOPY (EGD);  Surgeon: MarkJamesetta So;  [facility];  Service: Gastroenterology;  Laterality: N/A;  . ESOPHAGOGASTRODUODENOSCOPY N/A 07/28/2014   Procedure: ESOPHAGOGASTRODUODENOSCOPY (EGD);  Surgeon: NajeRogene Houston;  [facility];  Service: Endoscopy;  Laterality: N/A;  200-rescheduled to 245 [place] pt  .  ESOPHAGOGASTRODUODENOSCOPY N/A 08/14/2016   Procedure: ESOPHAGOGASTRODUODENOSCOPY (EGD);  Surgeon: Rogene Houston, MD;  [facility];  Service: Endoscopy;  Laterality: N/A;  100  . EYE SURGERY Bilateral    lasik surgery   . KNEE ARTHROSCOPY WITH MEDIAL MENISECTOMY Left 02/05/2018   Procedure: LEFT KNEE ARTHROSCOPY WITH PARTIAL MEDIAL MENISECTOMY;  Surgeon: Carole Civil, MD;  Location: [facility];  Service: Orthopedics;  Laterality: Left;  . KNEE SURGERY    . LATERAL EPICONDYLE RELEASE Left 01/21/2014   Procedure: Left Tennis Elbow Release with debridement  tendon  repair with reattachment;  Surgeon: Carole Civil, MD;  Location: [facility];  Service: Orthopedics;  Laterality: Left;  . penile implant    . PROSTATE SURGERY    . SHOULDER SURGERY    . TONSILLECTOMY    . TOTAL KNEE ARTHROPLASTY Left 10/19/2018   Procedure: LEFT TOTAL KNEE ARTHROPLASTY;  Surgeon: Vickey Huger, MD;  Location: [facility];  Service: Orthopedics;  Laterality: Left;  with abductor block failed spinal  . TRANSTHORACIC ECHOCARDIOGRAM  03/2010   EF=>55%; LV mildly dilated; LA mod dilated; mild MR & TR; normal RVSP; mild pulm valve regurg    There were no vitals filed for this visit.      [facility] PT Assessment - 12/04/18 0001      Assessment   Medical Diagnosis  S/P Left TKA    Referring Provider (PT)  Irving Shows, MD    Next MD Visit  01/05/19      Home Environment   Living Environment  Private residence    Living Arrangements  Spouse/significant other    Type of [place] to enter    Entrance Stairs-Number of Steps  1    [place]  One level    [place] - 2 wheels;Cane - single point;Bedside commode;Crutches      Prior Function   Level of Independence  Independent;Independent with basic ADLs      Observation/Other Assessments   Focus on Therapeutic Outcomes (FOTO)   54% (46% limited)       Circumferential Edema   Circumferential - Right  14 inches at joint line    Circumferential - Left   15.5 inches at joint line      AROM   Left Knee Extension  5   was 5   Left Knee Flexion  113   was 99     Strength   Right Hip Flexion  4+/5   was 4+   Right Hip Extension  4/5   ws 3   Right Hip ABduction  4+/5   was 3   Left Hip Flexion  4+/5   was 4+   Left Hip Extension  3-/5   was 3   Left Hip ABduction  4+/5   was 3   Right Knee Flexion  5/5    Right Knee Extension  5/5    Left Knee Flexion  4+/5   was 4   Left Knee Extension  4/5   was 4   Right Ankle Dorsiflexion  5/5   was 5    Left Ankle Dorsiflexion  5/5   was 5     Palpation   Palpation comment  Noted muscular tightness through left hamstrings with medial more tight than lateral       Ambulation/Gait   Ambulation/Gait  Yes    Ambulation Distance (Feet)  452 Feet   2MWT   Assistive device  None    Gait Pattern  Decreased hip/knee flexion - left;Left flexed knee in stance;Decreased stance time - left;Decreased stride length;Decreased weight shift to left;Step-through pattern    Ambulation Surface  Level;Indoor    Gait velocity  1.14 m/s      Static Standing Balance   Static Standing - Balance Support  No upper extremity supported    Static Standing Balance -  Activities   Single Leg Stance - Right Leg;Single Leg Stance - Left Leg    Static Standing - Comment/# of Minutes  5 seconds each lower extremity                   [facility] Adult PT Treatment/Exercise - 12/04/18 0001      Exercises   Exercises  Knee/Hip      Knee/Hip Exercises: Stretches   Quad Stretch  Left;3 reps;30 seconds    Quad Stretch Limitations  Prone with rope    Gastroc Stretch  Both;3 reps;30 seconds    Gastroc Stretch Limitations  On slant board      Knee/Hip Exercises: Aerobic   Stationary Bike  4 minutes full revolutions no resistance; seat 14      Knee/Hip Exercises: Prone   Hamstring Curl  1 set;10 reps;3 seconds      Manual Therapy   Manual Therapy  Soft tissue mobilization;Myofascial release    Manual therapy comments  all manual completed separately from other skilled interventions    Soft tissue mobilization  Patient supine. Soft tissue mobilization to patient's left hamstrings and quadriceps    Myofascial Release  Scar mobilization to improve knee mobility               PT Short Term Goals - 12/04/18 1654      PT SHORT TERM GOAL #1   Title  Patient will demonstrate understanding and report regular compliance with HEP to improve knee AROM, lower extremity strength, and overall functional mobility.      Time  3    Period  Weeks    Status  Achieved      PT SHORT TERM GOAL #2   Title  Patient will demonstrate left knee extension/flexion active range of motion of at least 5-95 degrees to assist with more normalized gait pattern and stair ambulation.    Time  3    Period  Weeks    Status  Achieved      PT SHORT TERM GOAL #3   Title  Patient will demonstrate improvement of  MMT grade in all musculature tested as deficient at evaluation to assist with improvement in gait mechanics and stair ambulation.     Time  3    Period  Weeks    Status  Achieved      PT SHORT TERM GOAL #4   Title  Patient will perform single limb stance on left/right lower extremity for 3 seconds in order to assist with stair ambulation.    Time  3    Period  Weeks    Status  Achieved        PT Long Term Goals - 12/04/18 1654      PT LONG TERM GOAL #1   Title  Patient will improve ROM for left knee extension/flexion to 0-120 degrees to improve squatting, and other functional mobility.    Baseline  12/04/18: 5- 113 degrees    Time  4    Period  Weeks  Status  Revised    Target Date  01/01/19      PT LONG TERM GOAL #2   Title  Patient will demonstrate improvement of 1 MMT grade in all musculature tested as deficient at evaluation to assist with improvement in gait mechanics and stair ambulation.     Baseline  12/04/18: See objective measures    Time  4    Period  Weeks    Status  Partially Met    Target Date  01/01/19      PT LONG TERM GOAL #3   Title  Patient will demonstrate ability to perform 10 repetitions on the 30 second chair rise indicating improved balance and functional strength.     Baseline  11/17/18: See objective measures    Time  6    Period  Weeks    Status  Achieved      PT LONG TERM GOAL #4   Title  Patient will report ability to ambulate for 10 minutes with least restrictive assistive device with no greater than 3/10 pain for improved ease of performing household activities.     Baseline  11/17/18: Patient reported that he felt he could do this.     Time  6    Period  Weeks    Status  Achieved      PT LONG TERM GOAL #5   Title  Patient will demonstrate ability to ambulate at a gait velocity of at least 0.8 m/s with LRAD on 2MWT indicating improved ability to ambulate safely household and limited community distances.     Baseline  11/17/18: See objective measures    Time  6    Period  Weeks    Status  Achieved      Additional Long Term Goals   Additional Long Term Goals  Yes      PT LONG TERM GOAL #6   Title  Patient will demonstrate ability to maintain single limb stance on each lower extremity for at least 10 seconds for improved balance and safety with stair negotiation.     Time  4    Period  Weeks    Status  New    Target Date  01/01/19            Plan - 12/04/18 1700    Clinical Impression Statement  This session performed a re-assessment of patient's progress towards goals. Patient has achieved all short term goals. Patient achieved 3 out of 5 long term goals. Patient continues to demonstrate deficits primarily with left knee extension AROM, as well as with balance, and some continued areas of weakness. Patient has demonstrated improvements in gait velocity this session as well as some areas of improvement with strength and some improvements in AROM. For the remainder of the session had patient perform exercises focused on improving AROM and flexibility as well as manual to improve muscle extensibility and scar mobility. Patient would benefit from continued skilled physical therapy in order to continue addressing the abovementioned deficits and help patient reach his prior level of function.     Rehab Potential  Good    Clinical Impairments Affecting Rehab Potential  Positive: Motivated; Negative: acute    PT Frequency  2x / week    PT Duration  4 weeks    PT Treatment/Interventions  ADLs/Self Care Home Management;Aquatic  Therapy;Cryotherapy;Electrical Stimulation;Moist Heat;DME Instruction;Gait training;Stair training;Functional mobility training;Therapeutic activities;Therapeutic exercise;Balance training;Neuromuscular re-education;Patient/family education;Orthotic Fit/Training;Manual techniques;Scar mobilization;Passive range of motion;Dry needling;Energy conservation;Taping    PT Next Visit  Plan  Possible Biodex to improve knee PROM.  Focus on ROM and edema reduction initially and then progress to strengthening. Continue soft tissue mobilization, joint mobilization. Consider continuing contract relax.     PT Home Exercise Plan  10/22/18: Quad sets, heel slides, SAQ, 2x10 1x/day; ankle pumps 2 x 30 1x/day; 11/03/18: LAQ 2 x 10 5'' holds; 11/10/18: Heel/toe raises x10, hip abduction x 10 2x/day; 11/24/18: Hamstring stretch 3 x 30 seconds    Consulted and Agree with Plan of Care  Patient       Patient will benefit from skilled therapeutic intervention in order to improve the following deficits and impairments:  Abnormal gait, Decreased balance, Decreased endurance, Decreased mobility, Difficulty walking, Hypomobility, Decreased range of motion, Decreased scar mobility, Increased edema, Decreased activity tolerance, Decreased strength, Impaired flexibility, Pain  Visit Diagnosis: Acute pain of left knee  Stiffness of left knee, not elsewhere classified  Muscle weakness (generalized)  Other abnormalities of gait and mobility     Problem List Patient Active Problem List   Diagnosis Date Noted  . 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 S/P left knee arthroscopy 02/05/18 02/12/2018  . Ischemic cardiomyopathy 11/18/2017  . Acute pain of left knee 01/24/2017  . NSTEMI (non-ST elevated myocardial infarction) ([place]) 01/08/2017  . ST elevation myocardial infarction (STEMI) of inferior wall, subsequent episode of care ([place]) 01/08/2017  . Mixed hyperlipidemia 01/08/2017  . Sinus bradycardia 01/08/2017  . Left tennis  elbow 11/08/2013  . Stable angina ([place]) 11/08/2013  . CAD in native artery 11/08/2013  . PTSD (post-traumatic stress disorder) 11/08/2013  . Essential hypertension 11/08/2013  . Dyslipidemia 11/08/2013  . Fracture of phalanx of thumb 12/03/2011   Clarene Critchley PT, DPT 5:05 PM, 12/04/18 [place], [place], [address] Phone: [phone]   Fax:  [phone]  Name: AVISH TORRY MRN: [id number] Date of Birth: 01-04-1946

## 2018-12-04 NOTE — Patient Instructions (Signed)
Flexion: Stretch - Quadriceps (Prone)    Position Helper: Place one hand on shin near left ankle or you can use a belt or a strap instead of a helper. Stabilize buttock to keep hip flat on bed. Motion - Helper presses heel toward buttock slowly. CAUTION: Patient should feel stretch along front of thigh. There should be no pain in knee joint. Hold _30__ seconds. Repeat _3__ times. Do _1__ sessions per day.   Copyright  V[employer]. All rights reserved.

## 2018-12-08 ENCOUNTER — Ambulatory Visit (HOSPITAL_COMMUNITY): Payer: No Typology Code available for payment source | Admitting: Physical Therapy

## 2018-12-08 ENCOUNTER — Other Ambulatory Visit: Payer: Self-pay

## 2018-12-08 DIAGNOSIS — M25562 Pain in left knee: Secondary | ICD-10-CM | POA: Diagnosis not present

## 2018-12-08 DIAGNOSIS — M6281 Muscle weakness (generalized): Secondary | ICD-10-CM

## 2018-12-08 DIAGNOSIS — R2689 Other abnormalities of gait and mobility: Secondary | ICD-10-CM

## 2018-12-08 DIAGNOSIS — M25662 Stiffness of left knee, not elsewhere classified: Secondary | ICD-10-CM

## 2018-12-08 NOTE — Therapy (Signed)
[facility] [address] [place], [place], [address] Phone: [phone]   Fax:  [phone]  Physical Therapy Treatment  Patient Details  Name: Cory [name] MRN: [id number] Date of Birth: 30-Nov-1946 Referring Provider (PT): Irving Shows, MD   Encounter Date: 12/08/2018  PT End of Session - 12/08/18 1515    Visit Number  12    Number of Visits  16    Date for PT Re-Evaluation  01/01/19    Authorization Type  Primary: VA based on medical necessity limited to 4 modalities per visit; Secondary: [employer] HMO    Authorization Time Period  10/22/18 - 12/03/18; New requested 12/04/18 - 01/01/19 (Check for VA approval)    Authorization - Visit Number  0    Authorization - Number of Visits  15    PT Start Time  1430    PT Stop Time  1515    PT Time Calculation (min)  45 min    Activity Tolerance  Patient tolerated treatment well    Behavior During Therapy  WFL for tasks assessed/performed       Past Medical History:  Diagnosis Date  . Anemia   . Arthritis   . Coronary artery disease    a. 09/2001 PCI/BMS to D1: 2.5 x 15 Express 2 BMS;  b. 2011 Cath: stable anatomy; c. 03/2014 Low risk MV, EF 54%;  d. 09/2016 Inf STEMI/PCI [facility]): LM nl, LAD 26m D1 nl w/ 90% in 157minf branch, LCX nl, RCA 99 (Synergy DES x 4 - 3.5x28 prox, 3.5x3854m.5x16d, 2.25x16 to RPDA), EF 55%.  . GMarland KitchenRD (gastroesophageal reflux disease)   . Hyperlipidemia   . Hypertensive heart disease   . Myocardial infarction ([facility]  . Pneumonia   . PTSD (post-traumatic stress disorder)     Past Surgical History:  Procedure Laterality Date  . CARDIAC CATHETERIZATION  05/17/2010   left main free of significant lesion; LAD with 60% lesion in mid segment & 60-70% lesion in mid-distal segment with subtotal occlusion at apex & small side branch of diagonal 1 that is subtotally occluded; ramus intermedius with 90% ostial stenosis; L Cfx w/mild luminal irreg;  RCA is dominant vessel with diffuse disease, 50% prox lesion & 60% lesion in mid segment & 60% lesion in PDA  . CATARACT EXTRACTION W/PHACO Right 06/07/2015   Procedure: CATARACT EXTRACTION PHACO AND INTRAOCULAR LENS PLACEMENT (IOC);  Surgeon: RoyMarylynn PearsonD;  [facility]  Laterality: Right;  . COLONOSCOPY  02/25/2012   Procedure: COLONOSCOPY;  Surgeon: MarJamesetta SoD;  [facility];  Service: Gastroenterology;  Laterality: N/A;  . CORONARY ANGIOPLASTY WITH STENT PLACEMENT  09/2001   [place] (2.5x15m29mtent to 1st diagonal   . ESOPHAGEAL DILATION N/A 08/14/2016   Procedure: ESOPHAGEAL DILATION;  Surgeon: NajeRogene Houston;  [facility];  Service: Endoscopy;  Laterality: N/A;  . ESOPHAGOGASTRODUODENOSCOPY  02/25/2012   Procedure: ESOPHAGOGASTRODUODENOSCOPY (EGD);  Surgeon: MarkJamesetta So;  [facility];  Service: Gastroenterology;  Laterality: N/A;  . ESOPHAGOGASTRODUODENOSCOPY N/A 07/28/2014   Procedure: ESOPHAGOGASTRODUODENOSCOPY (EGD);  Surgeon: NajeRogene Houston;  [facility];  Service: Endoscopy;  Laterality: N/A;  200-res[place]uled to [place]  . ESOPHAGOGASTRODUODENOSCOPY N/A 08/14/2016   Procedure: ESOPHAGOGASTRODUODENOSCOPY (EGD);  Surgeon: NajeRogene Houston;  [facility];  Service: Endoscopy;  Laterality: N/A;  100  . EYE SURGERY Bilateral  lasik surgery   . KNEE ARTHROSCOPY WITH MEDIAL MENISECTOMY Left 02/05/2018   Procedure: LEFT KNEE ARTHROSCOPY WITH PARTIAL MEDIAL MENISECTOMY;  Surgeon: Carole Civil, MD;  Location: [facility];  Service: Orthopedics;  Laterality: Left;  . KNEE SURGERY    . LATERAL EPICONDYLE RELEASE Left 01/21/2014   Procedure: Left Tennis Elbow Release with debridement  tendon repair with reattachment;  Surgeon: Carole Civil, MD;  Location: [facility];  Service: Orthopedics;  Laterality: Left;  . penile implant    . PROSTATE SURGERY    . SHOULDER SURGERY     . TONSILLECTOMY    . TOTAL KNEE ARTHROPLASTY Left 10/19/2018   Procedure: LEFT TOTAL KNEE ARTHROPLASTY;  Surgeon: Vickey Huger, MD;  Location: [facility];  Service: Orthopedics;  Laterality: Left;  with abductor block failed spinal  . TRANSTHORACIC ECHOCARDIOGRAM  03/2010   EF=>55%; LV mildly dilated; LA mod dilated; mild MR & TR; normal RVSP; mild pulm valve regurg    There were no vitals filed for this visit.  Subjective Assessment - 12/08/18 1425    Subjective  PT states that he is not getting his leg to bend back as well as he would like.      Pertinent History  Left TKA 10/19/18    Limitations  Standing;Walking;House hold activities    How long can you stand comfortably?  Not limited    How long can you walk comfortably?  10 minutes    Patient Stated Goals  Get the knee moving better    Currently in Pain?  Yes    Pain Score  2     Pain Location  Knee    Pain Orientation  Left    Pain Descriptors / Indicators  Sore    Pain Type  Acute pain    Pain Onset  1 to 4 weeks ago    Pain Frequency  Intermittent    Aggravating Factors   bending     Pain Relieving Factors  ice     Effect of Pain on Daily Activities  limits                       [facility] Adult PT Treatment/Exercise - 12/08/18 0001      Exercises   Exercises  Knee/Hip      Knee/Hip Exercises: Stretches   Active Hamstring Stretch  Left;3 reps;30 seconds    Passive Hamstring Stretch  Left;1 rep;60 seconds    Quad Stretch  Left;3 reps;30 seconds    Quad Stretch Limitations  Prone with rope    Knee: Self-Stretch to increase Flexion  Left;10 seconds;Other (comment)   10 repetitions on 12-inch step   Gastroc Stretch  Both;3 reps;30 seconds    Gastroc Stretch Limitations  On slant board      Knee/Hip Exercises: Standing   Knee Flexion  Left;10 reps    Terminal Knee Extension  Strengthening;Left;1 set;AROM;10 reps    Terminal Knee Extension Limitations  Pink ball into the wall 10x10 second holds       Knee/Hip Exercises: Supine   Quad Sets  AROM;Left;10 reps    Short Arc Quad Sets  AROM;Left;10 reps    Patellar Mobs  done    Knee Extension  AROM    Knee Extension Limitations  8    Knee Flexion  AROM    Knee Flexion Limitations  120    Other Supine Knee/Hip Exercises  PROM for extension 3 x 30"  Knee/Hip Exercises: Prone   Hamstring Curl  1 set;10 reps;3 seconds    Hamstring Curl Limitations  terminal flexion     Other Prone Exercises  terminal extension x 10       Manual Therapy   Manual Therapy  Soft tissue mobilization;Myofascial release    Manual therapy comments  all manual completed separately from other skilled interventions    Joint Mobilization  Left tibiofemoral PA and AP glides grade III-IV 3x30'' oscillations to improve left knee extension and flexion    Muscle Energy Technique  Contract relax to increase knee flexion and extension 10 x 10'' holds               PT Short Term Goals - 12/04/18 1654      PT SHORT TERM GOAL #1   Title  Patient will demonstrate understanding and report regular compliance with HEP to improve knee AROM, lower extremity strength, and overall functional mobility.     Time  3    Period  Weeks    Status  Achieved      PT SHORT TERM GOAL #2   Title  Patient will demonstrate left knee extension/flexion active range of motion of at least 5-95 degrees to assist with more normalized gait pattern and stair ambulation.    Time  3    Period  Weeks    Status  Achieved      PT SHORT TERM GOAL #3   Title  Patient will demonstrate improvement of  MMT grade in all musculature tested as deficient at evaluation to assist with improvement in gait mechanics and stair ambulation.     Time  3    Period  Weeks    Status  Achieved      PT SHORT TERM GOAL #4   Title  Patient will perform single limb stance on left/right lower extremity for 3 seconds in order to assist with stair ambulation.    Time  3    Period  Weeks    Status  Achieved         PT Long Term Goals - 12/04/18 1654      PT LONG TERM GOAL #1   Title  Patient will improve ROM for left knee extension/flexion to 0-120 degrees to improve squatting, and other functional mobility.    Baseline  12/04/18: 5- 113 degrees    Time  4    Period  Weeks    Status  Revised    Target Date  01/01/19      PT LONG TERM GOAL #2   Title  Patient will demonstrate improvement of 1 MMT grade in all musculature tested as deficient at evaluation to assist with improvement in gait mechanics and stair ambulation.     Baseline  12/04/18: See objective measures    Time  4    Period  Weeks    Status  Partially Met    Target Date  01/01/19      PT LONG TERM GOAL #3   Title  Patient will demonstrate ability to perform 10 repetitions on the 30 second chair rise indicating improved balance and functional strength.     Baseline  11/17/18: See objective measures    Time  6    Period  Weeks    Status  Achieved      PT LONG TERM GOAL #4   Title  Patient will report ability to ambulate for 10 minutes with least restrictive assistive device with no greater  than 3/10 pain for improved ease of performing household activities.    Baseline  11/17/18: Patient reported that he felt he could do this.     Time  6    Period  Weeks    Status  Achieved      PT LONG TERM GOAL #5   Title  Patient will demonstrate ability to ambulate at a gait velocity of at least 0.8 m/s with LRAD on 2MWT indicating improved ability to ambulate safely household and limited community distances.     Baseline  11/17/18: See objective measures    Time  6    Period  Weeks    Status  Achieved      Additional Long Term Goals   Additional Long Term Goals  Yes      PT LONG TERM GOAL #6   Title  Patient will demonstrate ability to maintain single limb stance on each lower extremity for at least 10 seconds for improved balance and safety with stair negotiation.     Time  4    Period  Weeks    Status  New    Target Date   01/01/19            Plan - 12/08/18 1516    Clinical Impression Statement  PT with noted gains in flexion but has not made progress in extension for the past few visits.  Therapist begain passive extension as well as passive hamstring stretching.  Noted spasm in distal quadriceps which were lessened but not obliterated with manual.     Rehab Potential  Good    Clinical Impairments Affecting Rehab Potential  Positive: Motivated; Negative: acute    PT Frequency  2x / week    PT Duration  4 weeks    PT Treatment/Interventions  ADLs/Self Care Home Management;Aquatic Therapy;Cryotherapy;Electrical Stimulation;Moist Heat;DME Instruction;Gait training;Stair training;Functional mobility training;Therapeutic activities;Therapeutic exercise;Balance training;Neuromuscular re-education;Patient/family education;Orthotic Fit/Training;Manual techniques;Scar mobilization;Passive range of motion;Dry needling;Energy conservation;Taping    PT Next Visit Plan  . Continue soft tissue mobilization, joint mobilization. Continue Passive extension.     PT Home Exercise Plan  10/22/18: Quad sets, heel slides, SAQ, 2x10 1x/day; ankle pumps 2 x 30 1x/day; 11/03/18: LAQ 2 x 10 5'' holds; 11/10/18: Heel/toe raises x10, hip abduction x 10 2x/day; 11/24/18: Hamstring stretch 3 x 30 seconds    Consulted and Agree with Plan of Care  Patient       Patient will benefit from skilled therapeutic intervention in order to improve the following deficits and impairments:  Abnormal gait, Decreased balance, Decreased endurance, Decreased mobility, Difficulty walking, Hypomobility, Decreased range of motion, Decreased scar mobility, Increased edema, Decreased activity tolerance, Decreased strength, Impaired flexibility, Pain  Visit Diagnosis: Acute pain of left knee  Stiffness of left knee, not elsewhere classified  Muscle weakness (generalized)  Other abnormalities of gait and mobility     Problem List Patient Active  Problem List   Diagnosis Date Noted  . 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 S/P left knee arthroscopy 02/05/18 02/12/2018  . Ischemic cardiomyopathy 11/18/2017  . Acute pain of left knee 01/24/2017  . NSTEMI (non-ST elevated myocardial infarction) ([place]) 01/08/2017  . ST elevation myocardial infarction (STEMI) of inferior wall, subsequent episode of care ([place]) 01/08/2017  . Mixed hyperlipidemia 01/08/2017  . Sinus bradycardia 01/08/2017  . Left tennis elbow 11/08/2013  . Stable angina ([place]) 11/08/2013  . CAD in native artery 11/08/2013  . PTSD (post-traumatic stress disorder) 11/08/2013  . Essential hypertension 11/08/2013  .  Dyslipidemia 11/08/2013  . Fracture of phalanx of thumb 12/03/2011    Rayetta Humphrey, PT CLT [phone] 12/08/2018, 3:19 PM  [facility] [address] [place], [place], [address] Phone: [phone]   Fax:  [phone]  Name: Cory [name] MRN: [id number] Date of Birth: May 27, 1946

## 2018-12-10 ENCOUNTER — Ambulatory Visit (HOSPITAL_COMMUNITY): Payer: No Typology Code available for payment source | Admitting: Physical Therapy

## 2018-12-10 DIAGNOSIS — M25562 Pain in left knee: Secondary | ICD-10-CM | POA: Diagnosis not present

## 2018-12-10 DIAGNOSIS — E538 Deficiency of other specified B group vitamins: Secondary | ICD-10-CM | POA: Diagnosis not present

## 2018-12-10 DIAGNOSIS — R2689 Other abnormalities of gait and mobility: Secondary | ICD-10-CM

## 2018-12-10 DIAGNOSIS — E663 Overweight: Secondary | ICD-10-CM | POA: Diagnosis not present

## 2018-12-10 DIAGNOSIS — R69 Illness, unspecified: Secondary | ICD-10-CM | POA: Diagnosis not present

## 2018-12-10 DIAGNOSIS — I2510 Atherosclerotic heart disease of native coronary artery without angina pectoris: Secondary | ICD-10-CM | POA: Diagnosis not present

## 2018-12-10 DIAGNOSIS — R7309 Other abnormal glucose: Secondary | ICD-10-CM | POA: Diagnosis not present

## 2018-12-10 DIAGNOSIS — E782 Mixed hyperlipidemia: Secondary | ICD-10-CM | POA: Diagnosis not present

## 2018-12-10 DIAGNOSIS — Z6826 Body mass index (BMI) 26.0-26.9, adult: Secondary | ICD-10-CM | POA: Diagnosis not present

## 2018-12-10 DIAGNOSIS — I10 Essential (primary) hypertension: Secondary | ICD-10-CM | POA: Diagnosis not present

## 2018-12-10 DIAGNOSIS — M6281 Muscle weakness (generalized): Secondary | ICD-10-CM

## 2018-12-10 DIAGNOSIS — D508 Other iron deficiency anemias: Secondary | ICD-10-CM | POA: Diagnosis not present

## 2018-12-10 DIAGNOSIS — M25662 Stiffness of left knee, not elsewhere classified: Secondary | ICD-10-CM

## 2018-12-10 NOTE — Therapy (Addendum)
[place], [place], [address] Phone: [phone]   Fax:  [phone]  Physical Therapy Treatment  Patient Details  Name: Cory [name] MRN: [id number] Date of Birth: [birth year] Referring Provider (PT): Irving Shows, MD   Encounter Date: 12/10/2018  PT End of Session - 12/10/18 1617    Visit Number  13    Number of Visits  16    Date for PT Re-Evaluation  01/01/19    Authorization Type  Primary: [facility] based on medical necessity limited to 4 modalities per visit; Secondary: [employer] HMO    Authorization Time Period  10/22/18 - 12/03/18; New requested 12/04/18 - 01/01/19 (Check for [facility] approval)    Authorization - Visit Number  1    Authorization - Number of Visits  15    PT Start Time  1027    PT Stop Time  1638   4 minutes unbilled for bicycle   PT Time Calculation (min)  42 min    Activity Tolerance  Patient tolerated treatment well    Behavior During Therapy  WFL for tasks assessed/performed       Past Medical History:  Diagnosis Date  . Anemia   . Arthritis   . Coronary artery disease    a. 09/2001 PCI/BMS to D1: 2.5 x 15 Express 2 BMS;  b. 2011 Cath: stable anatomy; c. 03/2014 Low risk MV, EF 54%;  d. 09/2016 Inf STEMI/PCI [facility]): LM nl, LAD 65m D1 nl w/ 90% in 173minf branch, LCX nl, RCA 99 (Synergy DES x 4 - 3.5x28 prox, 3.5x3813m.5x16d, 2.25x16 to RPDA), EF 55%.  . GMarland KitchenRD (gastroesophageal reflux disease)   . Hyperlipidemia   . Hypertensive heart disease   . Myocardial infarction ([facility]  . Pneumonia   . PTSD (post-traumatic stress disorder)     Past Surgical History:  Procedure Laterality Date  . CARDIAC CATHETERIZATION  05/17/2010   left main free of significant lesion; LAD with 60% lesion in mid segment & 60-70% lesion in mid-distal segment with subtotal occlusion at apex & small side branch of diagonal 1 that is subtotally occluded; ramus intermedius with 90% ostial  stenosis; L Cfx w/mild luminal irreg; RCA is dominant vessel with diffuse disease, 50% prox lesion & 60% lesion in mid segment & 60% lesion in PDA  . CATARACT EXTRACTION W/PHACO Right 06/07/2015   Procedure: CATARACT EXTRACTION PHACO AND INTRAOCULAR LENS PLACEMENT (IOC);  Surgeon: RoyMarylynn PearsonD;  [facility];  Laterality: Right;  . COLONOSCOPY  02/25/2012   Procedure: COLONOSCOPY;  Surgeon: MarJamesetta SoD;  [facility];  Service: Gastroenterology;  Laterality: N/A;  . CORONARY ANGIOPLASTY WITH STENT PLACEMENT  09/2001   [place] (2.5x15m38mtent to 1st diagonal   . ESOPHAGEAL DILATION N/A 08/14/2016   Procedure: ESOPHAGEAL DILATION;  Surgeon: NajeRogene Houston;  [facility];  Service: Endoscopy;  Laterality: N/A;  . ESOPHAGOGASTRODUODENOSCOPY  02/25/2012   Procedure: ESOPHAGOGASTRODUODENOSCOPY (EGD);  Surgeon: MarkJamesetta So;  [facility];  Service: Gastroenterology;  Laterality: N/A;  . ESOPHAGOGASTRODUODENOSCOPY N/A 07/28/2014   Procedure: ESOPHAGOGASTRODUODENOSCOPY (EGD);  Surgeon: NajeRogene Houston;  [facility];  Service: Endoscopy;  Laterality: N/A;  200-rescheduled to 245 Challisified pt  . ESOPHAGOGASTRODUODENOSCOPY N/A 08/14/2016   Procedure: ESOPHAGOGASTRODUODENOSCOPY (EGD);  Surgeon: NajeRogene Houston;  [facility];  Service: Endoscopy;  Laterality: N/A;  100  . EYE SURGERY Bilateral    lasik surgery   . KNEE ARTHROSCOPY WITH MEDIAL MENISECTOMY Left 02/05/2018   Procedure: LEFT KNEE ARTHROSCOPY WITH PARTIAL MEDIAL MENISECTOMY;  Surgeon: Carole Civil, MD;  Location: [facility];  Service: Orthopedics;  Laterality: Left;  . KNEE SURGERY    . LATERAL EPICONDYLE RELEASE Left 01/21/2014   Procedure: Left Tennis Elbow Release with debridement  tendon repair with reattachment;  Surgeon: Carole Civil, MD;  Location: [facility];  Service: Orthopedics;  Laterality: Left;  . penile implant    .  PROSTATE SURGERY    . SHOULDER SURGERY    . TONSILLECTOMY    . TOTAL KNEE ARTHROPLASTY Left 10/19/2018   Procedure: LEFT TOTAL KNEE ARTHROPLASTY;  Surgeon: Vickey Huger, MD;  Location: [facility];  Service: Orthopedics;  Laterality: Left;  with abductor block failed spinal  . TRANSTHORACIC ECHOCARDIOGRAM  03/2010   EF=>55%; LV mildly dilated; LA mod dilated; mild MR & TR; normal RVSP; mild pulm valve regurg    There were no vitals filed for this visit.   Subjective Patient denied any pain this session.                 [place] Adult PT Treatment/Exercise - 12/10/18 0001      Knee/Hip Exercises: Stretches   Passive Hamstring Stretch  Left;3 reps;30 seconds    Passive Hamstring Stretch Limitations  On 12 inch step    Quad Stretch  Left;3 reps;30 seconds    Quad Stretch Limitations  Prone with rope    Knee: Self-Stretch to increase Flexion  Left;10 seconds;Other (comment)   10 repetitions on 12'' step   Gastroc Stretch  Both;3 reps;30 seconds    Gastroc Stretch Limitations  On slant board      Knee/Hip Exercises: Aerobic   Stationary Bike  4 minutes full revolutions no resistance; seat 13      Knee/Hip Exercises: Standing   Knee Flexion  Left;10 reps    Terminal Knee Extension  Strengthening;Left;1 set;AROM;10 reps    Terminal Knee Extension Limitations  Pink ball into the wall 10x10 second holds      Knee/Hip Exercises: Seated   Long Arc Quad  AROM;Strengthening;Left;10 reps;Limitations;2 sets    Long Arc Quad Weight  2 lbs.    Long Arc Quad Limitations  Weight for proprioception      Knee/Hip Exercises: Supine   Quad Sets  AROM;Left;10 reps    Short Arc Quad Sets  AROM;Left;10 reps      Manual Therapy   Manual Therapy  Soft tissue mobilization;Myofascial release;Joint mobilization    Manual therapy comments  all manual completed separately from other skilled interventions    Joint Mobilization  Patellar glides all directions grade III/IV    Soft tissue  mobilization  Patient supine. Soft tissue mobilization to patient's left hamstrings and quadriceps    Myofascial Release  Scar mobilization to improve knee mobility               PT Short Term Goals - 12/04/18 1654      PT SHORT TERM GOAL #1   Title  Patient will demonstrate understanding and report regular compliance with HEP to improve knee AROM, lower extremity strength, and overall functional mobility.     Time  3    Period  Weeks    Status  Achieved      PT SHORT TERM GOAL #2   Title  Patient will demonstrate left knee extension/flexion active range of  motion of at least 5-95 degrees to assist with more normalized gait pattern and stair ambulation.    Time  3    Period  Weeks    Status  Achieved      PT SHORT TERM GOAL #3   Title  Patient will demonstrate improvement of  MMT grade in all musculature tested as deficient at evaluation to assist with improvement in gait mechanics and stair ambulation.     Time  3    Period  Weeks    Status  Achieved      PT SHORT TERM GOAL #4   Title  Patient will perform single limb stance on left/right lower extremity for 3 seconds in order to assist with stair ambulation.    Time  3    Period  Weeks    Status  Achieved        PT Long Term Goals - 12/04/18 1654      PT LONG TERM GOAL #1   Title  Patient will improve ROM for left knee extension/flexion to 0-120 degrees to improve squatting, and other functional mobility.    Baseline  12/04/18: 5- 113 degrees    Time  4    Period  Weeks    Status  Revised    Target Date  01/01/19      PT LONG TERM GOAL #2   Title  Patient will demonstrate improvement of 1 MMT grade in all musculature tested as deficient at evaluation to assist with improvement in gait mechanics and stair ambulation.     Baseline  12/04/18: See objective measures    Time  4    Period  Weeks    Status  Partially Met    Target Date  01/01/19      PT LONG TERM GOAL #3   Title  Patient will demonstrate ability  to perform 10 repetitions on the 30 second chair rise indicating improved balance and functional strength.     Baseline  11/17/18: See objective measures    Time  6    Period  Weeks    Status  Achieved      PT LONG TERM GOAL #4   Title  Patient will report ability to ambulate for 10 minutes with least restrictive assistive device with no greater than 3/10 pain for improved ease of performing household activities.    Baseline  11/17/18: Patient reported that he felt he could do this.     Time  6    Period  Weeks    Status  Achieved      PT LONG TERM GOAL #5   Title  Patient will demonstrate ability to ambulate at a gait velocity of at least 0.8 m/s with LRAD on 2MWT indicating improved ability to ambulate safely household and limited community distances.     Baseline  11/17/18: See objective measures    Time  6    Period  Weeks    Status  Achieved      Additional Long Term Goals   Additional Long Term Goals  Yes      PT LONG TERM GOAL #6   Title  Patient will demonstrate ability to maintain single limb stance on each lower extremity for at least 10 seconds for improved balance and safety with stair negotiation.     Time  4    Period  Weeks    Status  New    Target Date  01/01/19  Plan - 12/10/18 1622    Clinical Impression Statement  This session continued with established plan of care with a focus on improving patient's knee AROM and mobility. This session added weight to LAQ for proprioception to encourage more complete knee extension. Continued manual therapy in order to continue improving patient's knee mobility.      Rehab Potential  Good    Clinical Impairments Affecting Rehab Potential  Positive: Motivated; Negative: acute    PT Frequency  2x / week    PT Duration  4 weeks    PT Treatment/Interventions  ADLs/Self Care Home Management;Aquatic Therapy;Cryotherapy;Electrical Stimulation;Moist Heat;DME Instruction;Gait training;Stair training;Functional mobility  training;Therapeutic activities;Therapeutic exercise;Balance training;Neuromuscular re-education;Patient/family education;Orthotic Fit/Training;Manual techniques;Scar mobilization;Passive range of motion;Dry needling;Energy conservation;Taping    PT Next Visit Plan  Continue soft tissue mobilization, joint mobilization. Continue Passive extension.     PT Home Exercise Plan  10/22/18: Quad sets, heel slides, SAQ, 2x10 1x/day; ankle pumps 2 x 30 1x/day; 11/03/18: LAQ 2 x 10 5'' holds; 11/10/18: Heel/toe raises x10, hip abduction x 10 2x/day; 11/24/18: Hamstring stretch 3 x 30 seconds    Consulted and Agree with Plan of Care  Patient       Patient will benefit from skilled therapeutic intervention in order to improve the following deficits and impairments:  Abnormal gait, Decreased balance, Decreased endurance, Decreased mobility, Difficulty walking, Hypomobility, Decreased range of motion, Decreased scar mobility, Increased edema, Decreased activity tolerance, Decreased strength, Impaired flexibility, Pain  Visit Diagnosis: Acute pain of left knee  Stiffness of left knee, not elsewhere classified  Muscle weakness (generalized)  Other abnormalities of gait and mobility     Problem List Patient Active Problem List   Diagnosis Date Noted  . 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 S/P left knee arthroscopy 02/05/18 02/12/2018  . Ischemic cardiomyopathy 11/18/2017  . Acute pain of left knee 01/24/2017  . NSTEMI (non-ST elevated myocardial infarction) ([place]) 01/08/2017  . ST elevation myocardial infarction (STEMI) of inferior wall, subsequent episode of care ([place]) 01/08/2017  . Mixed hyperlipidemia 01/08/2017  . Sinus bradycardia 01/08/2017  . Left tennis elbow 11/08/2013  . Stable angina ([place]) 11/08/2013  . CAD in native artery 11/08/2013  . PTSD (post-traumatic stress disorder) 11/08/2013  . Essential hypertension 11/08/2013  . Dyslipidemia 11/08/2013  . Fracture of phalanx of thumb  12/03/2011   Clarene Critchley PT, DPT 4:45 PM, 12/10/18 [place] [address] [place], [place], [address] Phone: [phone]   Fax:  [phone]  Name: Cory [name] MRN: [id number] Date of Birth: December 04, 1946

## 2018-12-14 ENCOUNTER — Ambulatory Visit (HOSPITAL_COMMUNITY): Payer: No Typology Code available for payment source | Admitting: Physical Therapy

## 2018-12-14 ENCOUNTER — Telehealth (HOSPITAL_COMMUNITY): Payer: Self-pay | Admitting: Physical Therapy

## 2018-12-14 ENCOUNTER — Telehealth (HOSPITAL_COMMUNITY): Payer: Self-pay | Admitting: Family Medicine

## 2018-12-14 NOTE — Telephone Encounter (Signed)
Talked with Cory [name] explained that his next apptment is on 12/20 hoping we'll have approval from [place] by then.He wants to get in sooner if we get the approval before the 20th.  NF 12/14/18

## 2018-12-14 NOTE — Telephone Encounter (Signed)
12/14/18  Cory [name] with [facility] because she said there was no way she would get approval in 1 day... usually takes 3-5 days

## 2018-12-15 ENCOUNTER — Encounter (HOSPITAL_COMMUNITY): Payer: No Typology Code available for payment source

## 2018-12-16 ENCOUNTER — Encounter (HOSPITAL_COMMUNITY): Payer: Self-pay | Admitting: Physical Therapy

## 2018-12-16 ENCOUNTER — Encounter (HOSPITAL_COMMUNITY): Payer: No Typology Code available for payment source | Admitting: Physical Therapy

## 2018-12-16 ENCOUNTER — Ambulatory Visit (HOSPITAL_COMMUNITY): Payer: No Typology Code available for payment source | Admitting: Physical Therapy

## 2018-12-16 DIAGNOSIS — M25562 Pain in left knee: Secondary | ICD-10-CM | POA: Diagnosis not present

## 2018-12-16 DIAGNOSIS — M25662 Stiffness of left knee, not elsewhere classified: Secondary | ICD-10-CM

## 2018-12-16 DIAGNOSIS — R2689 Other abnormalities of gait and mobility: Secondary | ICD-10-CM

## 2018-12-16 DIAGNOSIS — M6281 Muscle weakness (generalized): Secondary | ICD-10-CM

## 2018-12-16 NOTE — Therapy (Signed)
[place], [place], [address] Phone: [phone]   Fax:  [phone]  Physical Therapy Treatment  Patient Details  Name: Cory [name] MRN: [id number] Date of Birth: [birth year] Referring Provider (PT): Irving Shows, MD   Encounter Date: 12/16/2018  PT End of Session - 12/16/18 1441    Visit Number  14    Number of Visits  16    Date for PT Re-Evaluation  01/01/19    Authorization Type  Primary: VA based on medical necessity limited to 4 modalities per visit; Secondary: [employer] HMO    Authorization Time Period  10/22/18 - 12/03/18; New requested 12/04/18 - 01/01/19 (Check for VA approval)    Authorization - Visit Number  2    Authorization - Number of Visits  15    PT Start Time  1430    PT Stop Time  1510    PT Time Calculation (min)  40 min    Activity Tolerance  Patient tolerated treatment well    Behavior During Therapy  WFL for tasks assessed/performed       Past Medical History:  Diagnosis Date  . Anemia   . Arthritis   . Coronary artery disease    a. 09/2001 PCI/BMS to D1: 2.5 x 15 Express 2 BMS;  b. 2011 Cath: stable anatomy; c. 03/2014 Low risk MV, EF 54%;  d. 09/2016 Inf STEMI/PCI [facility]): LM nl, LAD 50m D1 nl w/ 90% in 148minf branch, LCX nl, RCA 99 (Synergy DES x 4 - 3.5x28 prox, 3.5x3858m.5x16d, 2.25x16 to RPDA), EF 55%.  . GMarland KitchenRD (gastroesophageal reflux disease)   . Hyperlipidemia   . Hypertensive heart disease   . [place]  . Pneumonia   . PTSD (post-traumatic stress disorder)     Past Surgical History:  Procedure Laterality Date  . CARDIAC CATHETERIZATION  05/17/2010   left main free of significant lesion; LAD with 60% lesion in mid segment & 60-70% lesion in mid-distal segment with subtotal occlusion at apex & small side branch of diagonal 1 that is subtotally occluded; ramus intermedius with 90% ostial stenosis; L Cfx w/mild luminal irreg;  RCA is dominant vessel with diffuse disease, 50% prox lesion & 60% lesion in mid segment & 60% lesion in PDA  . CATARACT EXTRACTION W/PHACO Right 06/07/2015   Procedure: CATARACT EXTRACTION PHACO AND INTRAOCULAR LENS PLACEMENT (IOC);  Surgeon: RoyMarylynn PearsonD;  [facility]  Laterality: Right;  . COLONOSCOPY  02/25/2012   Procedure: COLONOSCOPY;  Surgeon: MarJamesetta SoD;  [facility];  Service: Gastroenterology;  Laterality: N/A;  . CORONARY ANGIOPLASTY WITH STENT PLACEMENT  09/2001   [place] (2.5x15m93mtent to 1st diagonal   . ESOPHAGEAL DILATION N/A 08/14/2016   Procedure: ESOPHAGEAL DILATION;  Surgeon: NajeRogene Houston;  [facility];  Service: Endoscopy;  Laterality: N/A;  . ESOPHAGOGASTRODUODENOSCOPY  02/25/2012   Procedure: ESOPHAGOGASTRODUODENOSCOPY (EGD);  Surgeon: MarkJamesetta So;  [facility];  Service: Gastroenterology;  Laterality: N/A;  . ESOPHAGOGASTRODUODENOSCOPY N/A 07/28/2014   Procedure: ESOPHAGOGASTRODUODENOSCOPY (EGD);  Surgeon: NajeRogene Houston;  [facility];  Service: Endoscopy;  Laterality: N/A;  200-[place]uled to [address] [place]  . ESOPHAGOGASTRODUODENOSCOPY N/A 08/14/2016   Procedure: ESOPHAGOGASTRODUODENOSCOPY (EGD);  Surgeon: NajeRogene Houston;  [facility];  Service: Endoscopy;  Laterality: N/A;  100  . EYE SURGERY Bilateral  lasik surgery   . KNEE ARTHROSCOPY WITH MEDIAL MENISECTOMY Left 02/05/2018   Procedure: LEFT KNEE ARTHROSCOPY WITH PARTIAL MEDIAL MENISECTOMY;  Surgeon: Carole Civil, MD;  Location: [facility];  Service: Orthopedics;  Laterality: Left;  . KNEE SURGERY    . LATERAL EPICONDYLE RELEASE Left 01/21/2014   Procedure: Left Tennis Elbow Release with debridement  tendon repair with reattachment;  Surgeon: Carole Civil, MD;  Location: [facility];  Service: Orthopedics;  Laterality: Left;  . penile implant    . PROSTATE SURGERY    . SHOULDER SURGERY     . TONSILLECTOMY    . TOTAL KNEE ARTHROPLASTY Left 10/19/2018   Procedure: LEFT TOTAL KNEE ARTHROPLASTY;  Surgeon: Vickey Huger, MD;  Location: [facility];  Service: Orthopedics;  Laterality: Left;  with abductor block failed spinal  . TRANSTHORACIC ECHOCARDIOGRAM  03/2010   EF=>55%; LV mildly dilated; LA mod dilated; mild MR & TR; normal RVSP; mild pulm valve regurg    There were no vitals filed for this visit.  Subjective Assessment - 12/16/18 1438    Subjective  Patient reported that he isn't having any knee pain.     Pertinent History  Left TKA 10/19/18    Limitations  Standing;Walking;House hold activities    How long can you stand comfortably?  Not limited    How long can you walk comfortably?  10 minutes    Patient Stated Goals  Get the knee moving better    Currently in Pain?  No/denies    Pain Onset  1 to 4 weeks ago                       [facility] Adult PT Treatment/Exercise - 12/16/18 0001      Knee/Hip Exercises: Stretches   Passive Hamstring Stretch  Left;3 reps;30 seconds    Passive Hamstring Stretch Limitations  On 12 inch step    Knee: Self-Stretch to increase Flexion  Left;10 seconds;Other (comment)   10 repetitions on 12'' step   Gastroc Stretch  Both;3 reps;30 seconds    Gastroc Stretch Limitations  On slant board      Knee/Hip Exercises: Machines for Strengthening   Total Gym Leg Press  40# bilateral lower extremities x 15    Other Machine  Biodex PROM: Began at 60% progressing to 99% flexion and extension x 15 minutes.       Knee/Hip Exercises: Standing   Knee Flexion  Left;10 reps    Lateral Step Up  Left;1 set;10 reps;Hand Hold: 0;Step Height: 6"    Forward Step Up  Left;1 set;10 reps;Step Height: 6";Hand Hold: 0    Step Down  1 set;10 reps;Step Height: 6";Hand Hold: 0    Functional Squat  1 set;10 reps    Functional Squat Limitations  Cues for form      Knee/Hip Exercises: Supine   Knee Extension  AROM    Knee Extension Limitations  6     Knee Flexion  AROM    Knee Flexion Limitations  122      Knee/Hip Exercises: Prone   Other Prone Exercises  Prone knee hang with 4# weight on left ankle for 4 minutes             PT Education - 12/16/18 1441    Education Details  Educated on PROM biodex machine.     Person(s) Educated  Patient    Methods  Explanation    Comprehension  Verbalized understanding  PT Short Term Goals - 12/04/18 1654      PT SHORT TERM GOAL #1   Title  Patient will demonstrate understanding and report regular compliance with HEP to improve knee AROM, lower extremity strength, and overall functional mobility.     Time  3    Period  Weeks    Status  Achieved      PT SHORT TERM GOAL #2   Title  Patient will demonstrate left knee extension/flexion active range of motion of at least 5-95 degrees to assist with more normalized gait pattern and stair ambulation.    Time  3    Period  Weeks    Status  Achieved      PT SHORT TERM GOAL #3   Title  Patient will demonstrate improvement of  MMT grade in all musculature tested as deficient at evaluation to assist with improvement in gait mechanics and stair ambulation.     Time  3    Period  Weeks    Status  Achieved      PT SHORT TERM GOAL #4   Title  Patient will perform single limb stance on left/right lower extremity for 3 seconds in order to assist with stair ambulation.    Time  3    Period  Weeks    Status  Achieved        PT Long Term Goals - 12/04/18 1654      PT LONG TERM GOAL #1   Title  Patient will improve ROM for left knee extension/flexion to 0-120 degrees to improve squatting, and other functional mobility.    Baseline  12/04/18: 5- 113 degrees    Time  4    Period  Weeks    Status  Revised    Target Date  01/01/19      PT LONG TERM GOAL #2   Title  Patient will demonstrate improvement of 1 MMT grade in all musculature tested as deficient at evaluation to assist with improvement in gait mechanics and stair ambulation.      Baseline  12/04/18: See objective measures    Time  4    Period  Weeks    Status  Partially Met    Target Date  01/01/19      PT LONG TERM GOAL #3   Title  Patient will demonstrate ability to perform 10 repetitions on the 30 second chair rise indicating improved balance and functional strength.     Baseline  11/17/18: See objective measures    Time  6    Period  Weeks    Status  Achieved      PT LONG TERM GOAL #4   Title  Patient will report ability to ambulate for 10 minutes with least restrictive assistive device with no greater than 3/10 pain for improved ease of performing household activities.    Baseline  11/17/18: Patient reported that he felt he could do this.     Time  6    Period  Weeks    Status  Achieved      PT LONG TERM GOAL #5   Title  Patient will demonstrate ability to ambulate at a gait velocity of at least 0.8 m/s with LRAD on 2MWT indicating improved ability to ambulate safely household and limited community distances.     Baseline  11/17/18: See objective measures    Time  6    Period  Weeks    Status  Achieved  Additional Long Term Goals   Additional Long Term Goals  Yes      PT LONG TERM GOAL #6   Title  Patient will demonstrate ability to maintain single limb stance on each lower extremity for at least 10 seconds for improved balance and safety with stair negotiation.     Time  4    Period  Weeks    Status  New    Target Date  01/01/19            Plan - 12/16/18 1524    Clinical Impression Statement  This session continued to focus on improving patient's left knee range of motion. This session initiated PROM on the Biodex machine with progressing to 99% of knee flexion and extension. This session also initiated leg press and prone knee hang. Patient denied pain throughout session. Patient would continue to benefit from continued skilled physical therapy to continue progressing towards functional goals.     Rehab Potential  Good    Clinical  Impairments Affecting Rehab Potential  Positive: Motivated; Negative: acute    PT Frequency  2x / week    PT Duration  4 weeks    PT Treatment/Interventions  ADLs/Self Care Home Management;Aquatic Therapy;Cryotherapy;Electrical Stimulation;Moist Heat;DME Instruction;Gait training;Stair training;Functional mobility training;Therapeutic activities;Therapeutic exercise;Balance training;Neuromuscular re-education;Patient/family education;Orthotic Fit/Training;Manual techniques;Scar mobilization;Passive range of motion;Dry needling;Energy conservation;Taping    PT Next Visit Plan  Follow-up on tolerance to Biodex PROM and consider continuing Biodex for PROM, continue focusing on knee extension AROM.     PT Home Exercise Plan  10/22/18: Quad sets, heel slides, SAQ, 2x10 1x/day; ankle pumps 2 x 30 1x/day; 11/03/18: LAQ 2 x 10 5'' holds; 11/10/18: Heel/toe raises x10, hip abduction x 10 2x/day; 11/24/18: Hamstring stretch 3 x 30 seconds    Consulted and Agree with Plan of Care  Patient       Patient will benefit from skilled therapeutic intervention in order to improve the following deficits and impairments:  Abnormal gait, Decreased balance, Decreased endurance, Decreased mobility, Difficulty walking, Hypomobility, Decreased range of motion, Decreased scar mobility, Increased edema, Decreased activity tolerance, Decreased strength, Impaired flexibility, Pain  Visit Diagnosis: Acute pain of left knee  Stiffness of left knee, not elsewhere classified  Muscle weakness (generalized)  Other abnormalities of gait and mobility     Problem List Patient Active Problem List   Diagnosis Date Noted  . 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 S/P left knee arthroscopy 02/05/18 02/12/2018  . Ischemic cardiomyopathy 11/18/2017  . Acute pain of left knee 01/24/2017  . NSTEMI (non-ST elevated myocardial infarction) ([place]) 01/08/2017  . ST elevation myocardial infarction (STEMI) of inferior wall, subsequent  episode of care ([place]) 01/08/2017  . Mixed hyperlipidemia 01/08/2017  . Sinus bradycardia 01/08/2017  . Left tennis elbow 11/08/2013  . Stable angina ([place]) 11/08/2013  . CAD in native artery 11/08/2013  . PTSD (post-traumatic stress disorder) 11/08/2013  . Essential hypertension 11/08/2013  . Dyslipidemia 11/08/2013  . Fracture of phalanx of thumb 12/03/2011   Clarene Critchley PT, DPT 3:29 PM, 12/16/18 [place], [place], [address] Phone: [phone]   Fax:  [phone]  Name: Cory [name] MRN: [id number] Date of Birth: 06-May-1946

## 2018-12-18 ENCOUNTER — Encounter (HOSPITAL_COMMUNITY): Payer: Self-pay

## 2018-12-18 ENCOUNTER — Ambulatory Visit (HOSPITAL_COMMUNITY): Payer: No Typology Code available for payment source

## 2018-12-18 DIAGNOSIS — R2689 Other abnormalities of gait and mobility: Secondary | ICD-10-CM

## 2018-12-18 DIAGNOSIS — M6281 Muscle weakness (generalized): Secondary | ICD-10-CM

## 2018-12-18 DIAGNOSIS — M25662 Stiffness of left knee, not elsewhere classified: Secondary | ICD-10-CM

## 2018-12-18 DIAGNOSIS — M25562 Pain in left knee: Secondary | ICD-10-CM | POA: Diagnosis not present

## 2018-12-18 NOTE — Therapy (Signed)
[place], [place], [address] Phone: [phone]   Fax:  [phone]  Physical Therapy Treatment  Patient Details  Name: Cory [name] MRN: [id number] Date of Birth: Aug 22, 1946 Referring Provider (PT): Irving Shows, MD   Encounter Date: 12/18/2018  PT End of Session - 12/18/18 1114    Visit Number  15    Number of Visits  16    Date for PT Re-Evaluation  01/01/19    Authorization Type  Primary: [facility] based on medical necessity limited to 4 modalities per visit; Secondary: [employer] HMO    Authorization Time Period  10/22/18 - 12/03/18; New requested 12/04/18 - 01/01/19 (Check for VA approval)    Authorization - Visit Number  3    Authorization - Number of Visits  15    PT Start Time  1115    PT Stop Time  1157    PT Time Calculation (min)  42 min    Activity Tolerance  Patient tolerated treatment well    Behavior During Therapy  WFL for tasks assessed/performed       Past Medical History:  Diagnosis Date  . Anemia   . Arthritis   . Coronary artery disease    a. 09/2001 PCI/BMS to D1: 2.5 x 15 Express 2 BMS;  b. 2011 Cath: stable anatomy; c. 03/2014 Low risk MV, EF 54%;  d. 09/2016 Inf STEMI/PCI [facility]): LM nl, LAD 96m D1 nl w/ 90% in 170minf branch, LCX nl, RCA 99 (Synergy DES x 4 - 3.5x28 prox, 3.5x3834m.5x16d, 2.25x16 to RPDA), EF 55%.  . GMarland KitchenRD (gastroesophageal reflux disease)   . Hyperlipidemia   . Hypertensive heart disease   . [place]ocardial [place]  . Pneumonia   . PTSD (post-traumatic stress disorder)     Past Surgical History:  Procedure Laterality Date  . CARDIAC CATHETERIZATION  05/17/2010   left main free of significant lesion; LAD with 60% lesion in mid segment & 60-70% lesion in mid-distal segment with subtotal occlusion at apex & small side branch of diagonal 1 that is subtotally occluded; ramus intermedius with 90% ostial stenosis; L Cfx w/mild luminal irreg;  RCA is dominant vessel with diffuse disease, 50% prox lesion & 60% lesion in mid segment & 60% lesion in PDA  . CATARACT EXTRACTION W/PHACO Right 06/07/2015   Procedure: CATARACT EXTRACTION PHACO AND INTRAOCULAR LENS PLACEMENT (IOC);  Surgeon: RoyMarylynn PearsonD;  [facility];  Laterality: Right;  . COLONOSCOPY  02/25/2012   Procedure: COLONOSCOPY;  Surgeon: MarJamesetta SoD;  [facility];  Service: Gastroenterology;  Laterality: N/A;  . CORONARY ANGIOPLASTY WITH STENT PLACEMENT  09/2001   [place] (2.5x15m6mtent to 1st diagonal   . ESOPHAGEAL DILATION N/A 08/14/2016   Procedure: ESOPHAGEAL DILATION;  Surgeon: NajeRogene Houston;  [facility];  Service: Endoscopy;  Laterality: N/A;  . ESOPHAGOGASTRODUODENOSCOPY  02/25/2012   Procedure: ESOPHAGOGASTRODUODENOSCOPY (EGD);  Surgeon: MarkJamesetta So;  [facility];  Service: Gastroenterology;  Laterality: N/A;  . ESOPHAGOGASTRODUODENOSCOPY N/A 07/28/2014   Procedure: ESOPHAGOGASTRODUODENOSCOPY (EGD);  Surgeon: NajeRogene Houston;  [facility];  Service: Endoscopy;  Laterality: N/A;  200-rescheduled to [address] [place]  . ESOPHAGOGASTRODUODENOSCOPY N/A 08/14/2016   Procedure: ESOPHAGOGASTRODUODENOSCOPY (EGD);  Surgeon: NajeRogene Houston;  [facility];  Service: Endoscopy;  Laterality: N/A;  100  . EYE SURGERY Bilateral  lasik surgery   . KNEE ARTHROSCOPY WITH MEDIAL MENISECTOMY Left 02/05/2018   Procedure: LEFT KNEE ARTHROSCOPY WITH PARTIAL MEDIAL MENISECTOMY;  Surgeon: Carole Civil, MD;  Location: [facility];  Service: Orthopedics;  Laterality: Left;  . KNEE SURGERY    . LATERAL EPICONDYLE RELEASE Left 01/21/2014   Procedure: Left Tennis Elbow Release with debridement  tendon repair with reattachment;  Surgeon: Carole Civil, MD;  Location: [facility];  Service: Orthopedics;  Laterality: Left;  . penile implant    . PROSTATE SURGERY    . SHOULDER SURGERY     . TONSILLECTOMY    . TOTAL KNEE ARTHROPLASTY Left 10/19/2018   Procedure: LEFT TOTAL KNEE ARTHROPLASTY;  Surgeon: Vickey Huger, MD;  Location: [facility];  Service: Orthopedics;  Laterality: Left;  with abductor block failed spinal  . TRANSTHORACIC ECHOCARDIOGRAM  03/2010   EF=>55%; LV mildly dilated; LA mod dilated; mild MR & TR; normal RVSP; mild pulm valve regurg    There were no vitals filed for this visit.  Subjective Assessment - 12/18/18 1204    Subjective  Pt states that he is doing well. No knee pain at the moment. he still can't get it all the way straight.     Pertinent History  Left TKA 10/19/18    Limitations  Standing;Walking;House hold activities    How long can you stand comfortably?  Not limited    How long can you walk comfortably?  10 minutes    Patient Stated Goals  Get the knee moving better    Currently in Pain?  No/denies    Pain Onset  1 to 4 weeks ago          [facility] Adult PT Treatment/Exercise - 12/18/18 0001      Knee/Hip Exercises: Stretches   Passive Hamstring Stretch  Left;3 reps;30 seconds    Passive Hamstring Stretch Limitations  On 12 inch step    Knee: Self-Stretch to increase Flexion  Left;10 seconds;Other (comment)    Knee: Self-Stretch Limitations  10x10", 12" step    Gastroc Stretch  Both;3 reps;30 seconds    Gastroc Stretch Limitations  On slant board      Knee/Hip Exercises: Machines for Strengthening   Other Machine  Biodex PROM: Began at 85% progressing to 99% flexion and extension x 15 minutes.       Knee/Hip Exercises: Standing   Terminal Knee Extension  Left;10 reps    Theraband Level (Terminal Knee Extension)  Level 4 (Blue)    Terminal Knee Extension Limitations  10" holds      Knee/Hip Exercises: Supine   Short Arc Quad Sets  Left;2 sets;10 reps    Short Arc Quad Sets Limitations  5#, 2nd set with ER    Knee Extension Limitations  5    Knee Flexion Limitations  123    Other Supine Knee/Hip Exercises  supine heel prop x5 mins       Manual Therapy   Manual Therapy  Joint mobilization;Myofascial release    Manual therapy comments  all manual completed separately from other skilled interventions           PT Education - 12/18/18 1138    Education Details  exercise technique, updated HEP, educated on [place] approval and will call him when we hear back from them    Person(s) Educated  Patient    Methods  Explanation;Demonstration;Handout    Comprehension  Verbalized understanding;Returned demonstration       PT Short Term Goals - 12/04/18  [place] #1   Title  Patient will demonstrate understanding and report regular compliance with HEP to improve knee AROM, lower extremity strength, and overall functional mobility.     Time  3    Period  Weeks    Status  Achieved      PT SHORT TERM GOAL #2   Title  Patient will demonstrate left knee extension/flexion active range of motion of at least 5-95 degrees to assist with more normalized gait pattern and stair ambulation.    Time  3    Period  Weeks    Status  Achieved      PT SHORT TERM GOAL #3   Title  Patient will demonstrate improvement of  MMT grade in all musculature tested as deficient at evaluation to assist with improvement in gait mechanics and stair ambulation.     Time  3    Period  Weeks    Status  Achieved      PT SHORT TERM GOAL #4   Title  Patient will perform single limb stance on left/right lower extremity for 3 seconds in order to assist with stair ambulation.    Time  3    Period  Weeks    Status  Achieved        PT Long Term Goals - 12/04/18 1654      PT LONG TERM GOAL #1   Title  Patient will improve ROM for left knee extension/flexion to 0-120 degrees to improve squatting, and other functional mobility.    Baseline  12/04/18: 5- 113 degrees    Time  4    Period  Weeks    Status  Revised    Target Date  01/01/19      PT LONG TERM GOAL #2   Title  Patient will demonstrate improvement of 1 MMT grade in all  musculature tested as deficient at evaluation to assist with improvement in gait mechanics and stair ambulation.     Baseline  12/04/18: See objective measures    Time  4    Period  Weeks    Status  Partially Met    Target Date  01/01/19      PT LONG TERM GOAL #3   Title  Patient will demonstrate ability to perform 10 repetitions on the 30 second chair rise indicating improved balance and functional strength.     Baseline  11/17/18: See objective measures    Time  6    Period  Weeks    Status  Achieved      PT LONG TERM GOAL #4   Title  Patient will report ability to ambulate for 10 minutes with least restrictive assistive device with no greater than 3/10 pain for improved ease of performing household activities.    Baseline  11/17/18: Patient reported that he felt he could do this.     Time  6    Period  Weeks    Status  Achieved      PT LONG TERM GOAL #5   Title  Patient will demonstrate ability to ambulate at a gait velocity of at least 0.8 m/s with LRAD on 2MWT indicating improved ability to ambulate safely household and limited community distances.     Baseline  11/17/18: See objective measures    Time  6    Period  Weeks    Status  Achieved      Additional Long Term Goals  Additional Long Term Goals  Yes      PT LONG TERM GOAL #6   Title  Patient will demonstrate ability to maintain single limb stance on each lower extremity for at least 10 seconds for improved balance and safety with stair negotiation.     Time  4    Period  Weeks    Status  New    Target Date  01/01/19            Plan - 12/18/18 1202    Clinical Impression Statement  Continued with established POC focusing on knee extension and overall strength. Added BTB to TKE and added to HEP for quad strength and knee extension ROM. Continued with Biodex for ROM and added supine heel prop to low-load long duration HS stretch and encouraged pt to perform at home. Resumed tibiofemoral joint mobs and added end  range tibial ER joint mobs for end range extension. AROM 5-123deg this date. VA approval has not yet been received for more visits so PT updated pt's HEP this date and he verbalized understanding. Once approval received, will continue to address ROM and overall strength in order to maximize return to PLOF.     Rehab Potential  Good    Clinical Impairments Affecting Rehab Potential  Positive: Motivated; Negative: acute    PT Frequency  2x / week    PT Duration  4 weeks    PT Treatment/Interventions  ADLs/Self Care Home Management;Aquatic Therapy;Cryotherapy;Electrical Stimulation;Moist Heat;DME Instruction;Gait training;Stair training;Functional mobility training;Therapeutic activities;Therapeutic exercise;Balance training;Neuromuscular re-education;Patient/family education;Orthotic Fit/Training;Manual techniques;Scar mobilization;Passive range of motion;Dry needling;Energy conservation;Taping    PT Next Visit Plan  continue ROM and strengthening; reassessment if pt's VA approval received outside of PT POC cert window; Follow-up on tolerance to Biodex PROM and consider continuing Biodex for PROM, continue focusing on knee extension AROM.     PT Home Exercise Plan  10/22/18: Quad sets, heel slides, SAQ, 2x10 1x/day; ankle pumps 2 x 30 1x/day; 11/03/18: LAQ 2 x 10 5'' holds; 11/10/18: Heel/toe raises x10, hip abduction x 10 2x/day; 11/24/18: Hamstring stretch 3 x 30 seconds; 12/20: prone quad stretch, supine heel prop, SAQ, SLR, SLS, TKE BTB    Consulted and Agree with Plan of Care  Patient       Patient will benefit from skilled therapeutic intervention in order to improve the following deficits and impairments:  Abnormal gait, Decreased balance, Decreased endurance, Decreased mobility, Difficulty walking, Hypomobility, Decreased range of motion, Decreased scar mobility, Increased edema, Decreased activity tolerance, Decreased strength, Impaired flexibility, Pain  Visit Diagnosis: Acute pain of left  knee  Stiffness of left knee, not elsewhere classified  Muscle weakness (generalized)  Other abnormalities of gait and mobility     Problem List Patient Active Problem List   Diagnosis Date Noted  . 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 S/P left knee arthroscopy 02/05/18 02/12/2018  . Ischemic cardiomyopathy 11/18/2017  . Acute pain of left knee 01/24/2017  . NSTEMI (non-ST elevated myocardial infarction) ([place]) 01/08/2017  . ST elevation myocardial infarction (STEMI) of inferior wall, subsequent episode of care ([place]) 01/08/2017  . Mixed hyperlipidemia 01/08/2017  . Sinus bradycardia 01/08/2017  . Left tennis elbow 11/08/2013  . Stable angina ([place]) 11/08/2013  . CAD in native artery 11/08/2013  . PTSD (post-traumatic stress disorder) 11/08/2013  . Essential hypertension 11/08/2013  . Dyslipidemia 11/08/2013  . Fracture of phalanx of thumb 12/03/2011       Geraldine Solar PT, DPT  [facility]  [place], [place], [address] Phone: [phone]   Fax:  [phone]  Name: Cory [name] MRN: [id number] Date of Birth: 1946/12/12

## 2018-12-18 NOTE — Patient Instructions (Signed)
Access Code: [email or web address]  URL: [email or web address]  Date: 12/18/2018  Prepared by: Geraldine Solar   Exercises Prone Quadriceps Stretch with Strap - 5-10 reps - 30-60seconds hold - 1-2x daily - 7x weekly Supine Knee Extension Mobilization with Weight - 10 reps - 3 sets - 1x daily - 7x weekly Standing Terminal Knee Extension with Resistance - 10 reps - 3 sets - 5-10seconds hold - 1x daily - 7x weekly Standing Single Leg Stance with Counter Support - 10 reps - 3 sets - 1x daily - 7x weekly Supine Short Arc Quad - 10 reps - 3 sets - 1x daily - 7x weekly Supine Active Straight Leg Raise - 10 reps - 3 sets - 1x daily - 7x weekly

## 2018-12-24 ENCOUNTER — Telehealth (HOSPITAL_COMMUNITY): Payer: Self-pay | Admitting: Physical Therapy

## 2018-12-24 NOTE — Telephone Encounter (Signed)
Mr. Cory [name] came by to confirm 12/31/2018 apptment states he as [place] paper work that has approved 15 more visit, he will bring to apptment to scan into his chart. NF 12/24/18

## 2018-12-31 ENCOUNTER — Ambulatory Visit (HOSPITAL_COMMUNITY): Payer: No Typology Code available for payment source | Attending: Orthopedic Surgery | Admitting: Physical Therapy

## 2018-12-31 ENCOUNTER — Encounter (HOSPITAL_COMMUNITY): Payer: Self-pay | Admitting: Physical Therapy

## 2018-12-31 DIAGNOSIS — M25662 Stiffness of left knee, not elsewhere classified: Secondary | ICD-10-CM

## 2018-12-31 DIAGNOSIS — R2689 Other abnormalities of gait and mobility: Secondary | ICD-10-CM | POA: Diagnosis present

## 2018-12-31 DIAGNOSIS — M25562 Pain in left knee: Secondary | ICD-10-CM | POA: Diagnosis present

## 2018-12-31 DIAGNOSIS — M6281 Muscle weakness (generalized): Secondary | ICD-10-CM

## 2018-12-31 NOTE — Therapy (Signed)
[facility] [address] [place], [place], [address] Phone: [phone]   Fax:  [phone]  Physical Therapy Treatment  Patient Details  Name: Cory [name] MRN: [id number] Date of Birth: 12-15-1946 Referring Provider (PT): Irving Shows, MD   Encounter Date: 12/31/2018  PT End of Session - 12/31/18 1310    Visit Number  16    Number of Visits  24    Date for PT Re-Evaluation  01/01/19    Authorization Type  Primary: VA based on medical necessity limited to 4 modalities per visit; Secondary: [employer] HMO    Authorization Time Period  10/22/18 - 12/03/18; New requested 12/04/18 - 01/01/19 (Check for VA approval)    Authorization - Visit Number  1    Authorization - Number of Visits  15    PT Start Time  3903    PT Stop Time  1343    PT Time Calculation (min)  38 min    Activity Tolerance  Patient tolerated treatment well    Behavior During Therapy  WFL for tasks assessed/performed       Past Medical History:  Diagnosis Date  . Anemia   . Arthritis   . Coronary artery disease    a. 09/2001 PCI/BMS to D1: 2.5 x 15 Express 2 BMS;  b. 2011 Cath: stable anatomy; c. 03/2014 Low risk MV, EF 54%;  d. 09/2016 Inf STEMI/PCI [facility]): LM nl, LAD 91m D1 nl w/ 90% in 173minf branch, LCX nl, RCA 99 (Synergy DES x 4 - 3.5x28 prox, 3.5x3864m.5x16d, 2.25x16 to RPDA), EF 55%.  . GMarland KitchenRD (gastroesophageal reflux disease)   . Hyperlipidemia   . Hypertensive heart disease   . Myocardial infarction ([facility]CC[facility]ouris017  . Pneumonia   . PTSD (post-traumatic stress disorder)     Past Surgical History:  Procedure Laterality Date  . CARDIAC CATHETERIZATION  05/17/2010   left main free of significant lesion; LAD with 60% lesion in mid segment & 60-70% lesion in mid-distal segment with subtotal occlusion at apex & small side branch of diagonal 1 that is subtotally occluded; ramus intermedius with 90% ostial stenosis; L Cfx w/mild luminal irreg;  RCA is dominant vessel with diffuse disease, 50% prox lesion & 60% lesion in mid segment & 60% lesion in PDA  . CATARACT EXTRACTION W/PHACO Right 06/07/2015   Procedure: CATARACT EXTRACTION PHACO AND INTRAOCULAR LENS PLACEMENT (IOC);  Surgeon: RoyMarylynn PearsonD;  [facility]  Laterality: Right;  . COLONOSCOPY  02/25/2012   Procedure: COLONOSCOPY;  Surgeon: MarJamesetta SoD;  [facility];  Service: Gastroenterology;  Laterality: N/A;  . CORONARY ANGIOPLASTY WITH STENT PLACEMENT  09/2001   Taxus (2.5x15m45mtent to 1st diagonal   . ESOPHAGEAL DILATION N/A 08/14/2016   Procedure: ESOPHAGEAL DILATION;  Surgeon: NajeRogene Houston;  [facility];  Service: Endoscopy;  Laterality: N/A;  . ESOPHAGOGASTRODUODENOSCOPY  02/25/2012   Procedure: ESOPHAGOGASTRODUODENOSCOPY (EGD);  Surgeon: MarkJamesetta So;  [facility];  Service: Gastroenterology;  Laterality: N/A;  . ESOPHAGOGASTRODUODENOSCOPY N/A 07/28/2014   Procedure: ESOPHAGOGASTRODUODENOSCOPY (EGD);  Surgeon: NajeRogene Houston;  [facility];  Service: Endoscopy;  Laterality: N/A;  200-rescheduled to 245 [place] pt  . ESOPHAGOGASTRODUODENOSCOPY N/A 08/14/2016   Procedure: ESOPHAGOGASTRODUODENOSCOPY (EGD);  Surgeon: NajeRogene Houston;  [facility];  Service: Endoscopy;  Laterality: N/A;  100  . EYE SURGERY Bilateral  lasik surgery   . KNEE ARTHROSCOPY WITH MEDIAL MENISECTOMY Left 02/05/2018   Procedure: LEFT KNEE ARTHROSCOPY WITH PARTIAL MEDIAL MENISECTOMY;  Surgeon: Carole Civil, MD;  Location: [facility];  Service: Orthopedics;  Laterality: Left;  . KNEE SURGERY    . LATERAL EPICONDYLE RELEASE Left 01/21/2014   Procedure: Left Tennis Elbow Release with debridement  tendon repair with reattachment;  Surgeon: Carole Civil, MD;  Location: [facility];  Service: Orthopedics;  Laterality: Left;  . penile implant    . PROSTATE SURGERY    . SHOULDER SURGERY     . TONSILLECTOMY    . TOTAL KNEE ARTHROPLASTY Left 10/19/2018   Procedure: LEFT TOTAL KNEE ARTHROPLASTY;  Surgeon: Vickey Huger, MD;  Location: [facility];  Service: Orthopedics;  Laterality: Left;  with abductor block failed spinal  . TRANSTHORACIC ECHOCARDIOGRAM  03/2010   EF=>55%; LV mildly dilated; LA mod dilated; mild MR & TR; normal RVSP; mild pulm valve regurg    There were no vitals filed for this visit.  Subjective Assessment - 12/31/18 1353    Subjective  Patient reported he is not having any pain today. He stated he has been keeping up with his exercises at home.     Pertinent History  Left TKA 10/19/18    Limitations  Standing;Walking;House hold activities    How long can you stand comfortably?  Not limited    How long can you walk comfortably?  10 minutes    Patient Stated Goals  Get the knee moving better    Currently in Pain?  No/denies                       [facility] Adult PT Treatment/Exercise - 12/31/18 0001      Knee/Hip Exercises: Stretches   Passive Hamstring Stretch  Left;3 reps;30 seconds    Passive Hamstring Stretch Limitations  On 12 inch step    Knee: Self-Stretch to increase Flexion  Left;10 seconds;Other (comment)    Knee: Self-Stretch Limitations  10x10", 12" step    Gastroc Stretch  Both;3 reps;30 seconds    Gastroc Stretch Limitations  On slant board      Knee/Hip Exercises: Machines for Strengthening   Other Machine  Biodex PROM: Began at 85% progressing to 99% flexion and extension x 15 minutes.       Knee/Hip Exercises: Standing   Terminal Knee Extension  Left;10 reps    Theraband Level (Terminal Knee Extension)  Level 4 (Blue)    Terminal Knee Extension Limitations  10" holds    SLS  x5 each LE intermittent UE assistance inside // bars    Other Standing Knee Exercises  Tandem stance 2 x 30 seconds each LE forward on foam      Knee/Hip Exercises: Supine   Short Arc Quad Sets  Left;2 sets;10 reps    Short Arc Quad Sets Limitations   5#, 2nd set with ER             PT Education - 12/31/18 1354    Education Details  Discussed plan to re-assess patient at next session. Discussed progress towards ROM goals.     Person(s) Educated  Patient    Methods  Explanation    Comprehension  Verbalized understanding       PT Short Term Goals - 12/04/18 1654      PT SHORT TERM GOAL #1   Title  Patient will demonstrate understanding and report regular compliance with HEP to improve knee  AROM, lower extremity strength, and overall functional mobility.     Time  3    Period  Weeks    Status  Achieved      PT SHORT TERM GOAL #2   Title  Patient will demonstrate left knee extension/flexion active range of motion of at least 5-95 degrees to assist with more normalized gait pattern and stair ambulation.    Time  3    Period  Weeks    Status  Achieved      PT SHORT TERM GOAL #3   Title  Patient will demonstrate improvement of  MMT grade in all musculature tested as deficient at evaluation to assist with improvement in gait mechanics and stair ambulation.     Time  3    Period  Weeks    Status  Achieved      PT SHORT TERM GOAL #4   Title  Patient will perform single limb stance on left/right lower extremity for 3 seconds in order to assist with stair ambulation.    Time  3    Period  Weeks    Status  Achieved        PT Long Term Goals - 12/04/18 1654      PT LONG TERM GOAL #1   Title  Patient will improve ROM for left knee extension/flexion to 0-120 degrees to improve squatting, and other functional mobility.    Baseline  12/04/18: 5- 113 degrees    Time  4    Period  Weeks    Status  Revised    Target Date  01/01/19      PT LONG TERM GOAL #2   Title  Patient will demonstrate improvement of 1 MMT grade in all musculature tested as deficient at evaluation to assist with improvement in gait mechanics and stair ambulation.     Baseline  12/04/18: See objective measures    Time  4    Period  Weeks    Status   Partially Met    Target Date  01/01/19      PT LONG TERM GOAL #3   Title  Patient will demonstrate ability to perform 10 repetitions on the 30 second chair rise indicating improved balance and functional strength.     Baseline  11/17/18: See objective measures    Time  6    Period  Weeks    Status  Achieved      PT LONG TERM GOAL #4   Title  Patient will report ability to ambulate for 10 minutes with least restrictive assistive device with no greater than 3/10 pain for improved ease of performing household activities.    Baseline  11/17/18: Patient reported that he felt he could do this.     Time  6    Period  Weeks    Status  Achieved      PT LONG TERM GOAL #5   Title  Patient will demonstrate ability to ambulate at a gait velocity of at least 0.8 m/s with LRAD on 2MWT indicating improved ability to ambulate safely household and limited community distances.     Baseline  11/17/18: See objective measures    Time  6    Period  Weeks    Status  Achieved      Additional Long Term Goals   Additional Long Term Goals  Yes      PT LONG TERM GOAL #6   Title  Patient will demonstrate ability to maintain single limb  stance on each lower extremity for at least 10 seconds for improved balance and safety with stair negotiation.     Time  4    Period  Weeks    Status  New    Target Date  01/01/19            Plan - 12/31/18 1335    Clinical Impression Statement  This session continued with established plan of care. Continued with PROM with BIODEX this session. This session added balance activities to improve lower extremity balance. Plan to re-assess patient next session to assess progress and possible need for further therapy.     Rehab Potential  Good    Clinical Impairments Affecting Rehab Potential  Positive: Motivated; Negative: acute    PT Frequency  2x / week    PT Duration  4 weeks    PT Treatment/Interventions  ADLs/Self Care Home Management;Aquatic  Therapy;Cryotherapy;Electrical Stimulation;Moist Heat;DME Instruction;Gait training;Stair training;Functional mobility training;Therapeutic activities;Therapeutic exercise;Balance training;Neuromuscular re-education;Patient/family education;Orthotic Fit/Training;Manual techniques;Scar mobilization;Passive range of motion;Dry needling;Energy conservation;Taping    PT Next Visit Plan  Re-assess next session. Continue balance activites as needed. continue ROM and strengthening; reassessment if pt's VA approval received outside of PT POC cert window; Follow-up on tolerance to Biodex PROM and consider continuing Biodex for PROM, continue focusing on knee extension AROM.     PT Home Exercise Plan  10/22/18: Quad sets, heel slides, SAQ, 2x10 1x/day; ankle pumps 2 x 30 1x/day; 11/03/18: LAQ 2 x 10 5'' holds; 11/10/18: Heel/toe raises x10, hip abduction x 10 2x/day; 11/24/18: Hamstring stretch 3 x 30 seconds; 12/20: prone quad stretch, supine heel prop, SAQ, SLR, SLS, TKE BTB    Consulted and Agree with Plan of Care  Patient       Patient will benefit from skilled therapeutic intervention in order to improve the following deficits and impairments:  Abnormal gait, Decreased balance, Decreased endurance, Decreased mobility, Difficulty walking, Hypomobility, Decreased range of motion, Decreased scar mobility, Increased edema, Decreased activity tolerance, Decreased strength, Impaired flexibility, Pain  Visit Diagnosis: Acute pain of left knee  Stiffness of left knee, not elsewhere classified  Muscle weakness (generalized)  Other abnormalities of gait and mobility     Problem List Patient Active Problem List   Diagnosis Date Noted  . 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 S/P left knee arthroscopy 02/05/18 02/12/2018  . Ischemic cardiomyopathy 11/18/2017  . Acute pain of left knee 01/24/2017  . NSTEMI (non-ST elevated myocardial infarction) ([place]) 01/08/2017  . ST elevation myocardial infarction  (STEMI) of inferior wall, subsequent episode of care ([place]) 01/08/2017  . Mixed hyperlipidemia 01/08/2017  . Sinus bradycardia 01/08/2017  . Left tennis elbow 11/08/2013  . Stable angina ([place]) 11/08/2013  . CAD in native artery 11/08/2013  . PTSD (post-traumatic stress disorder) 11/08/2013  . Essential hypertension 11/08/2013  . Dyslipidemia 11/08/2013  . Fracture of phalanx of thumb 12/03/2011   Clarene Critchley PT, DPT 2:00 PM, 12/31/18 [place], [place], [address] Phone: [phone]   Fax:  [phone]  Name: HARROL NOVELLO MRN: [id number] Date of Birth: 10/26/1946

## 2019-01-04 ENCOUNTER — Ambulatory Visit (INDEPENDENT_AMBULATORY_CARE_PROVIDER_SITE_OTHER): Payer: Medicare HMO | Admitting: Internal Medicine

## 2019-01-06 ENCOUNTER — Ambulatory Visit (HOSPITAL_COMMUNITY): Payer: No Typology Code available for payment source | Admitting: Physical Therapy

## 2019-01-06 DIAGNOSIS — M6281 Muscle weakness (generalized): Secondary | ICD-10-CM

## 2019-01-06 DIAGNOSIS — M25562 Pain in left knee: Secondary | ICD-10-CM | POA: Diagnosis not present

## 2019-01-06 DIAGNOSIS — M25662 Stiffness of left knee, not elsewhere classified: Secondary | ICD-10-CM

## 2019-01-06 DIAGNOSIS — R2689 Other abnormalities of gait and mobility: Secondary | ICD-10-CM

## 2019-01-06 NOTE — Therapy (Signed)
[facility] [address] [place], [place], [address] Phone: [phone]   Fax:  [phone]  Physical Therapy Treatment / Re-assessment  Patient Details  Name: Cory [name] MRN: [id number] Date of Birth: May 01, 1946 Referring Provider (PT): Irving Shows, MD   Encounter Date: 01/06/2019   Progress Note Reporting Period 12/04/18 to 01/06/19  See note below for Objective Data and Assessment of Progress/Goals.       PT End of Session - 01/06/19 1305    Visit Number  17    Number of Visits  24    Date for PT Re-Evaluation  01/01/19    Authorization Type  Primary: VA based on medical necessity limited to 4 modalities per visit; Secondary: [employer] HMO    Authorization Time Period  10/22/18 - 12/03/18; New requested 12/04/18 - 01/01/19 (Check for VA approval)    Authorization - Visit Number  2    Authorization - Number of Visits  15    PT Start Time  1302    PT Stop Time  1345    PT Time Calculation (min)  43 min    Activity Tolerance  Patient tolerated treatment well    Behavior During Therapy  WFL for tasks assessed/performed       Past Medical History:  Diagnosis Date  . Anemia   . Arthritis   . Coronary artery disease    a. 09/2001 PCI/BMS to D1: 2.5 x 15 Express 2 BMS;  b. 2011 Cath: stable anatomy; c. 03/2014 Low risk MV, EF 54%;  d. 09/2016 Inf STEMI/PCI [facility]): LM nl, LAD 58m D1 nl w/ 90% in 164minf branch, LCX nl, RCA 99 (Synergy DES x 4 - 3.5x28 prox, 3.5x3866m.5x16d, 2.25x16 to RPDA), EF 55%.  . GMarland KitchenRD (gastroesophageal reflux disease)   . Hyperlipidemia   . Hypertensive heart disease   . Myocardial infarction ([facility]  . Pneumonia   . PTSD (post-traumatic stress disorder)     Past Surgical History:  Procedure Laterality Date  . CARDIAC CATHETERIZATION  05/17/2010   left main free of significant lesion; LAD with 60% lesion in mid segment & 60-70% lesion in mid-distal segment with subtotal  occlusion at apex & small side branch of diagonal 1 that is subtotally occluded; ramus intermedius with 90% ostial stenosis; L Cfx w/mild luminal irreg; RCA is dominant vessel with diffuse disease, 50% prox lesion & 60% lesion in mid segment & 60% lesion in PDA  . CATARACT EXTRACTION W/PHACO Right 06/07/2015   Procedure: CATARACT EXTRACTION PHACO AND INTRAOCULAR LENS PLACEMENT (IOC);  Surgeon: RoyMarylynn PearsonD;  [facility];  Laterality: Right;  . COLONOSCOPY  02/25/2012   Procedure: COLONOSCOPY;  Surgeon: MarJamesetta SoD;  [facility];  Service: Gastroenterology;  Laterality: N/A;  . CORONARY ANGIOPLASTY WITH STENT PLACEMENT  09/2001   [place]us (2.5x15m1mtent to 1st diagonal   . ESOPHAGEAL DILATION N/A 08/14/2016   Procedure: ESOPHAGEAL DILATION;  Surgeon: NajeRogene Houston;  [facility];  Service: Endoscopy;  Laterality: N/A;  . ESOPHAGOGASTRODUODENOSCOPY  02/25/2012   Procedure: ESOPHAGOGASTRODUODENOSCOPY (EGD);  Surgeon: MarkJamesetta So;  [facility];  Service: Gastroenterology;  Laterality: N/A;  . ESOPHAGOGASTRODUODENOSCOPY N/A 07/28/2014   Procedure: ESOPHAGOGASTRODUODENOSCOPY (EGD);  Surgeon: NajeRogene Houston;  [facility];  Service: Endoscopy;  Laterality: N/A;  200-[place]cheduled to [address] [place]  . ESOPHAGOGASTRODUODENOSCOPY N/A 08/14/2016  Procedure: ESOPHAGOGASTRODUODENOSCOPY (EGD);  Surgeon: Rogene Houston, MD;  [facility];  Service: Endoscopy;  Laterality: N/A;  100  . EYE SURGERY Bilateral    lasik surgery   . KNEE ARTHROSCOPY WITH MEDIAL MENISECTOMY Left 02/05/2018   Procedure: LEFT KNEE ARTHROSCOPY WITH PARTIAL MEDIAL MENISECTOMY;  Surgeon: Carole Civil, MD;  Location: [facility];  Service: Orthopedics;  Laterality: Left;  . KNEE SURGERY    . LATERAL EPICONDYLE RELEASE Left 01/21/2014   Procedure: Left Tennis Elbow Release with debridement  tendon repair with reattachment;   Surgeon: Carole Civil, MD;  Location: [facility];  Service: Orthopedics;  Laterality: Left;  . penile implant    . PROSTATE SURGERY    . SHOULDER SURGERY    . TONSILLECTOMY    . TOTAL KNEE ARTHROPLASTY Left 10/19/2018   Procedure: LEFT TOTAL KNEE ARTHROPLASTY;  Surgeon: Vickey Huger, MD;  Location: [facility];  Service: Orthopedics;  Laterality: Left;  with abductor block failed spinal  . TRANSTHORACIC ECHOCARDIOGRAM  03/2010   EF=>55%; LV mildly dilated; LA mod dilated; mild MR & TR; normal RVSP; mild pulm valve regurg    There were no vitals filed for this visit.      [facility] PT Assessment - 01/06/19 0001      Assessment   Medical Diagnosis  S/P Left TKA    Referring Provider (PT)  Irving Shows, MD      Observation/Other Assessments   Focus on Therapeutic Outcomes (FOTO)   42% limited      Circumferential Edema   Circumferential - Right  14.5 inches at joint line    Circumferential - Left   15 inches at joint line      AROM   Left Knee Extension  5   was 5   Left Knee Flexion  125   was 113     Strength   Right Hip Flexion  4+/5   was 4+   Right Hip Extension  4/5   was 4   Right Hip ABduction  4+/5   was 4+   Left Hip Flexion  5/5   was 4+   Left Hip Extension  4/5   was 3-   Left Hip ABduction  4+/5   was 4+   Right Knee Flexion  5/5   was 5/5   Right Knee Extension  5/5   was 5   Left Knee Flexion  4+/5   was 4+   Left Knee Extension  4+/5   was 4   Right Ankle Dorsiflexion  5/5   was 5   Left Ankle Dorsiflexion  5/5   was 5     Static Standing Balance   Static Standing - Balance Support  No upper extremity supported    Static Standing Balance -  Activities   Single Leg Stance - Right Leg;Single Leg Stance - Left Leg    Static Standing - Comment/# of Minutes  5.45 seconds right, 4.98 seconds on the left                   [facility] Adult PT Treatment/Exercise - 01/06/19 0001      Knee/Hip Exercises: Machines for Strengthening   Cybex Leg  Press  50#, both legs, 2x10    Other Machine  Resisted ambulation 40# x 10 repetitions      Knee/Hip Exercises: Standing   Lateral Step Up  Left;1 set;Hand Hold: 0;Step Height: 6";Right;15 reps    Forward Step Up  Left;1 set;Step Height: 6";Hand Hold: 0;Right;15 reps    Step Down  1 set;10 reps;Step Height: 6";Hand Hold: 0;Right;Left    Functional Squat  1 set;10 reps    Functional Squat Limitations  Propping right foot up with 2'' step to increase weight bearing in the left lower extremity     Other Standing Knee Exercises  Tandem stance 2 x 30 seconds each LE forward on foam               PT Short Term Goals - 01/06/19 1323      PT SHORT TERM GOAL #1   Title  Patient will demonstrate understanding and report regular compliance with HEP to improve knee AROM, lower extremity strength, and overall functional mobility.     Time  3    Period  Weeks    Status  Achieved      PT SHORT TERM GOAL #2   Title  Patient will demonstrate left knee extension/flexion active range of motion of at least 5-95 degrees to assist with more normalized gait pattern and stair ambulation.    Time  3    Period  Weeks    Status  Achieved      PT SHORT TERM GOAL #3   Title  Patient will demonstrate improvement of  MMT grade in all musculature tested as deficient at evaluation to assist with improvement in gait mechanics and stair ambulation.     Time  3    Period  Weeks    Status  Achieved      PT SHORT TERM GOAL #4   Title  Patient will perform single limb stance on left/right lower extremity for 3 seconds in order to assist with stair ambulation.    Time  3    Period  Weeks    Status  Achieved        PT Long Term Goals - 01/06/19 1323      PT LONG TERM GOAL #1   Title  Patient will improve ROM for left knee extension/flexion to 0-120 degrees to improve squatting, and other functional mobility.    Baseline  See objective measures    Time  3    Period  Weeks    Status  On-going     Target Date  01/27/19      PT LONG TERM GOAL #2   Title  Patient will demonstrate improvement of 1 MMT grade in all musculature tested as deficient at evaluation to assist with improvement in gait mechanics and stair ambulation.     Baseline  01/06/19: Patient improved in some, but not all muscle groups    Time  3    Period  Weeks    Status  Partially Met    Target Date  01/27/19      PT LONG TERM GOAL #3   Title  Patient will demonstrate ability to perform 10 repetitions on the 30 second chair rise indicating improved balance and functional strength.     Baseline  11/17/18: See objective measures    Time  6    Period  Weeks    Status  Achieved      PT LONG TERM GOAL #4   Title  Patient will report ability to ambulate for 10 minutes with least restrictive assistive device with no greater than 3/10 pain for improved ease of performing household activities.    Baseline  11/17/18: Patient reported that he felt he could do this.     Time  6    Period  Weeks    Status  Achieved      PT LONG TERM GOAL #5   Title  Patient will demonstrate ability to ambulate at a gait velocity of at least 0.8 m/s with LRAD on 2MWT indicating improved ability to ambulate safely household and limited community distances.     Baseline  11/17/18: See objective measures    Time  6    Period  Weeks    Status  Achieved      Additional Long Term Goals   Additional Long Term Goals  Yes      PT LONG TERM GOAL #6   Title  Patient will demonstrate ability to maintain single limb stance on each lower extremity for at least 10 seconds for improved balance and safety with stair negotiation.     Baseline  See objective measures    Time  3    Period  Weeks    Status  On-going    Target Date  01/27/19      PT LONG TERM GOAL #7   Title  Patient will demonstrate ability to perform double leg squat with proper mechanics and equal weight distribution on each lower extremity.    Baseline  01/06/19: Patient demonstrated  some weight shift onto the right lower extremity with squatting.     Time  3    Period  Weeks    Status  New    Target Date  01/27/19            Plan - 01/06/19 1340    Clinical Impression Statement  This session performed a re-assessment of patient's progress towards functional goals. Patient had met all short term goals. Patient is on-going with 3 long term goals and 1 additional goal was added to focus on patient's form with functional squatting to decrease stress through patient's knees and prevent injury. Remainder of session focused on lower extremity strengthening and balance exercises. Patient would benefit from continued skilled physical therapy in order to continue progressing towards functional goals.     Rehab Potential  Good    Clinical Impairments Affecting Rehab Potential  Positive: Motivated; Negative: acute    PT Frequency  2x / week    PT Duration  3 weeks    PT Treatment/Interventions  ADLs/Self Care Home Management;Aquatic Therapy;Cryotherapy;Electrical Stimulation;Moist Heat;DME Instruction;Gait training;Stair training;Functional mobility training;Therapeutic activities;Therapeutic exercise;Balance training;Neuromuscular re-education;Patient/family education;Orthotic Fit/Training;Manual techniques;Scar mobilization;Passive range of motion;Dry needling;Energy conservation;Taping    PT Next Visit Plan  Continue balance activites as needed. Functional strengthening and squatting next session. continue ROM and strengthening; reassessment if pt's VA approval received outside of PT POC cert window; Follow-up on tolerance to Biodex PROM and consider continuing Biodex for PROM, continue focusing on knee extension AROM.     PT Home Exercise Plan  10/22/18: Quad sets, heel slides, SAQ, 2x10 1x/day; ankle pumps 2 x 30 1x/day; 11/03/18: LAQ 2 x 10 5'' holds; 11/10/18: Heel/toe raises x10, hip abduction x 10 2x/day; 11/24/18: Hamstring stretch 3 x 30 seconds; 12/20: prone quad stretch,  supine heel prop, SAQ, SLR, SLS, TKE BTB    Consulted and Agree with Plan of Care  Patient       Patient will benefit from skilled therapeutic intervention in order to improve the following deficits and impairments:  Abnormal gait, Decreased balance, Decreased endurance, Decreased mobility, Difficulty walking, Hypomobility, Decreased range of motion, Decreased scar mobility, Increased edema, Decreased activity tolerance, Decreased strength, Impaired flexibility, Pain  Visit Diagnosis:  Acute pain of left knee  Stiffness of left knee, not elsewhere classified  Muscle weakness (generalized)  Other abnormalities of gait and mobility     Problem List Patient Active Problem List   Diagnosis Date Noted  . 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 S/P left knee arthroscopy 02/05/18 02/12/2018  . Ischemic cardiomyopathy 11/18/2017  . Acute pain of left knee 01/24/2017  . NSTEMI (non-ST elevated myocardial infarction) ([place]) 01/08/2017  . ST elevation myocardial infarction (STEMI) of inferior wall, subsequent episode of care ([place]) 01/08/2017  . Mixed hyperlipidemia 01/08/2017  . Sinus bradycardia 01/08/2017  . Left tennis elbow 11/08/2013  . Stable angina ([place]) 11/08/2013  . CAD in native artery 11/08/2013  . PTSD (post-traumatic stress disorder) 11/08/2013  . Essential hypertension 11/08/2013  . Dyslipidemia 11/08/2013  . Fracture of phalanx of thumb 12/03/2011   Clarene Critchley PT, DPT 1:49 PM, 01/06/19 [place], [place], [address] Phone: [phone]   Fax:  [phone]  Name: SENAY SISTRUNK MRN: [id number] Date of Birth: 04-08-1946

## 2019-01-08 ENCOUNTER — Ambulatory Visit (HOSPITAL_COMMUNITY): Payer: No Typology Code available for payment source | Admitting: Physical Therapy

## 2019-01-08 ENCOUNTER — Encounter (HOSPITAL_COMMUNITY): Payer: Self-pay | Admitting: Physical Therapy

## 2019-01-08 DIAGNOSIS — M25662 Stiffness of left knee, not elsewhere classified: Secondary | ICD-10-CM

## 2019-01-08 DIAGNOSIS — R2689 Other abnormalities of gait and mobility: Secondary | ICD-10-CM

## 2019-01-08 DIAGNOSIS — M6281 Muscle weakness (generalized): Secondary | ICD-10-CM

## 2019-01-08 DIAGNOSIS — M25562 Pain in left knee: Secondary | ICD-10-CM | POA: Diagnosis not present

## 2019-01-08 NOTE — Therapy (Signed)
[place], [place], [address] Phone: [phone]   Fax:  [phone]  Physical Therapy Treatment  Patient Details  Name: Cory [name] MRN: [id number] Date of Birth: Jun 17, 1946 Referring Provider (PT): Irving Shows, MD   Encounter Date: 01/08/2019  PT End of Session - 01/08/19 0955    Visit Number  18    Number of Visits  24    Date for PT Re-Evaluation  01/29/19   Corrected for mistype   Authorization Type  Primary: VA based on medical necessity limited to 4 modalities per visit; [employer] [employer] HMO    Authorization Time Period  10/22/18 - 12/03/18; New: 01/06/19 - 01/29/19    Authorization - Visit Number  3    Authorization - Number of Visits  15    PT Start Time  0950    PT Stop Time  1028    PT Time Calculation (min)  38 min    Activity Tolerance  Patient tolerated treatment well    Behavior During Therapy  WFL for tasks assessed/performed       Past Medical History:  Diagnosis Date  . Anemia   . Arthritis   . Coronary artery disease    a. 09/2001 PCI/BMS to D1: 2.5 x 15 Express 2 BMS;  b. 2011 Cath: stable anatomy; c. 03/2014 Low risk MV, EF 54%;  d. 09/2016 Inf STEMI/PCI [facility]): LM nl, LAD 29m D1 nl w/ 90% in 160minf branch, LCX nl, RCA 99 (Synergy DES x 4 - 3.5x28 prox, 3.5x3828m.5x16d, 2.25x16 to RPDA), EF 55%.  . GMarland KitchenRD (gastroesophageal reflux disease)   . Hyperlipidemia   . Hypertensive heart disease   . [id number]  . Pneumonia   . PTSD (post-traumatic stress disorder)     Past Surgical History:  Procedure Laterality Date  . CARDIAC CATHETERIZATION  05/17/2010   left main free of significant lesion; LAD with 60% lesion in mid segment & 60-70% lesion in mid-distal segment with subtotal occlusion at apex & small side branch of diagonal 1 that is subtotally occluded; ramus intermedius with 90% ostial stenosis; L Cfx w/mild luminal irreg; RCA is  dominant vessel with diffuse disease, 50% prox lesion & 60% lesion in mid segment & 60% lesion in PDA  . CATARACT EXTRACTION W/PHACO Right 06/07/2015   Procedure: CATARACT EXTRACTION PHACO AND INTRAOCULAR LENS PLACEMENT (IOC);  Surgeon: RoyMarylynn PearsonD;  [facility];  Laterality: Right;  . COLONOSCOPY  02/25/2012   Procedure: COLONOSCOPY;  Surgeon: MarJamesetta SoD;  [facility];  Service: Gastroenterology;  Laterality: N/A;  . CORONARY ANGIOPLASTY WITH STENT PLACEMENT  09/2001   [place] (2.5x15m89mtent to 1st diagonal   . ESOPHAGEAL DILATION N/A 08/14/2016   Procedure: ESOPHAGEAL DILATION;  Surgeon: NajeRogene Houston;  [facility];  Service: Endoscopy;  Laterality: N/A;  . ESOPHAGOGASTRODUODENOSCOPY  02/25/2012   Procedure: ESOPHAGOGASTRODUODENOSCOPY (EGD);  Surgeon: MarkJamesetta So;  [facility];  Service: Gastroenterology;  Laterality: N/A;  . ESOPHAGOGASTRODUODENOSCOPY N/A 07/28/2014   Procedure: ESOPHAGOGASTRODUODENOSCOPY (EGD);  Surgeon: NajeRogene Houston;  [facility];  Service: Endoscopy;  Laterality: N/A;  200-rescheduled to [address] [place]  . ESOPHAGOGASTRODUODENOSCOPY N/A 08/14/2016   Procedure: ESOPHAGOGASTRODUODENOSCOPY (EGD);  Surgeon: NajeRogene Houston;  [facility];  Service: Endoscopy;  Laterality: N/A;  100  . EYE SURGERY Bilateral  lasik surgery   . KNEE ARTHROSCOPY WITH MEDIAL MENISECTOMY Left 02/05/2018   Procedure: LEFT KNEE ARTHROSCOPY WITH PARTIAL MEDIAL MENISECTOMY;  Surgeon: Carole Civil, MD;  Location: [facility];  Service: Orthopedics;  Laterality: Left;  . KNEE SURGERY    . LATERAL EPICONDYLE RELEASE Left 01/21/2014   Procedure: Left Tennis Elbow Release with debridement  tendon repair with reattachment;  Surgeon: Carole Civil, MD;  Location: [facility];  Service: Orthopedics;  Laterality: Left;  . penile implant    . PROSTATE SURGERY    . SHOULDER SURGERY    .  TONSILLECTOMY    . TOTAL KNEE ARTHROPLASTY Left 10/19/2018   Procedure: LEFT TOTAL KNEE ARTHROPLASTY;  Surgeon: Vickey Huger, MD;  Location: [facility];  Service: Orthopedics;  Laterality: Left;  with abductor block failed spinal  . TRANSTHORACIC ECHOCARDIOGRAM  03/2010   EF=>55%; LV mildly dilated; LA mod dilated; mild MR & TR; normal RVSP; mild pulm valve regurg    There were no vitals filed for this visit.  Subjective Assessment - 01/08/19 0952    Subjective  Patient denied any pain today. He reported that he did not have any soreness after last session. He stated that his knee is getting better every day.     Pertinent History  Left TKA 10/19/18    Limitations  Standing;Walking;House hold activities    How long can you stand comfortably?  Not limited    How long can you walk comfortably?  10 minutes    Patient Stated Goals  Get the knee moving better    Currently in Pain?  No/denies                       [facility] Adult PT Treatment/Exercise - 01/08/19 0001      Knee/Hip Exercises: Stretches   Passive Hamstring Stretch  Left;3 reps;30 seconds    Passive Hamstring Stretch Limitations  On 12 inch step    Gastroc Stretch  Both;3 reps;30 seconds    Gastroc Stretch Limitations  On slant board      Knee/Hip Exercises: Machines for Strengthening   Cybex Leg Press  50#, both legs, 2x10    Other Machine  Resisted ambulation 40# x 10 repetitions forward, 10 repetitions backward      Knee/Hip Exercises: Standing   Lateral Step Up  Left;1 set;Hand Hold: 0;Step Height: 6";Right;15 reps    Forward Step Up  Left;1 set;Step Height: 6";Hand Hold: 0;Right;15 reps    Step Down  1 set;Step Height: 6";Hand Hold: 0;Right;Left;15 reps    Functional Squat  1 set;10 reps    Functional Squat Limitations  Propping right foot up with 2'' step to increase weight bearing in the left lower extremity     SLS with Vectors  5x5'' holds forward/side/back with intermittent HHA    Other Standing Knee  Exercises  Tandem stance each LE forward on foam with shoulder flexion with 2# bar weight x 20             PT Education - 01/08/19 0953    Education Details  Discussed purpose and technique of interventions throughout session.     Person(s) Educated  Patient    Methods  Explanation    Comprehension  Verbalized understanding       PT Short Term Goals - 01/06/19 1323      PT SHORT TERM GOAL #1   Title  Patient will demonstrate understanding and report regular compliance with HEP to improve knee  AROM, lower extremity strength, and overall functional mobility.     Time  3    Period  Weeks    Status  Achieved      PT SHORT TERM GOAL #2   Title  Patient will demonstrate left knee extension/flexion active range of motion of at least 5-95 degrees to assist with more normalized gait pattern and stair ambulation.    Time  3    Period  Weeks    Status  Achieved      PT SHORT TERM GOAL #3   Title  Patient will demonstrate improvement of  MMT grade in all musculature tested as deficient at evaluation to assist with improvement in gait mechanics and stair ambulation.     Time  3    Period  Weeks    Status  Achieved      PT SHORT TERM GOAL #4   Title  Patient will perform single limb stance on left/right lower extremity for 3 seconds in order to assist with stair ambulation.    Time  3    Period  Weeks    Status  Achieved        PT Long Term Goals - 01/06/19 1323      PT LONG TERM GOAL #1   Title  Patient will improve ROM for left knee extension/flexion to 0-120 degrees to improve squatting, and other functional mobility.    Baseline  See objective measures    Time  3    Period  Weeks    Status  On-going    Target Date  01/27/19      PT LONG TERM GOAL #2   Title  Patient will demonstrate improvement of 1 MMT grade in all musculature tested as deficient at evaluation to assist with improvement in gait mechanics and stair ambulation.     Baseline  01/06/19: Patient improved  in some, but not all muscle groups    Time  3    Period  Weeks    Status  Partially Met    Target Date  01/27/19      PT LONG TERM GOAL #3   Title  Patient will demonstrate ability to perform 10 repetitions on the 30 second chair rise indicating improved balance and functional strength.     Baseline  11/17/18: See objective measures    Time  6    Period  Weeks    Status  Achieved      PT LONG TERM GOAL #4   Title  Patient will report ability to ambulate for 10 minutes with least restrictive assistive device with no greater than 3/10 pain for improved ease of performing household activities.    Baseline  11/17/18: Patient reported that he felt he could do this.     Time  6    Period  Weeks    Status  Achieved      PT LONG TERM GOAL #5   Title  Patient will demonstrate ability to ambulate at a gait velocity of at least 0.8 m/s with LRAD on 2MWT indicating improved ability to ambulate safely household and limited community distances.     Baseline  11/17/18: See objective measures    Time  6    Period  Weeks    Status  Achieved      Additional Long Term Goals   Additional Long Term Goals  Yes      PT LONG TERM GOAL #6   Title  Patient will demonstrate  ability to maintain single limb stance on each lower extremity for at least 10 seconds for improved balance and safety with stair negotiation.     Baseline  See objective measures    Time  3    Period  Weeks    Status  On-going    Target Date  01/27/19      PT LONG TERM GOAL #7   Title  Patient will demonstrate ability to perform double leg squat with proper mechanics and equal weight distribution on each lower extremity.    Baseline  01/06/19: Patient demonstrated some weight shift onto the right lower extremity with squatting.     Time  3    Period  Weeks    Status  New    Target Date  01/27/19            Plan - 01/08/19 1040    Clinical Impression Statement  This session focused on improving patient's lower extremity  strength and balance. Began session with stretching to improve mobility before strengthening. This session added backwards resisted walking to continue challenging patient's eccentric control. Also added single leg vector stance. Patient stated feeling good following session with no reports of pain.      Rehab Potential  Good    Clinical Impairments Affecting Rehab Potential  Positive: Motivated; Negative: acute    PT Frequency  2x / week    PT Duration  3 weeks    PT Treatment/Interventions  ADLs/Self Care Home Management;Aquatic Therapy;Cryotherapy;Electrical Stimulation;Moist Heat;DME Instruction;Gait training;Stair training;Functional mobility training;Therapeutic activities;Therapeutic exercise;Balance training;Neuromuscular re-education;Patient/family education;Orthotic Fit/Training;Manual techniques;Scar mobilization;Passive range of motion;Dry needling;Energy conservation;Taping    PT Next Visit Plan  Focus on strengthening and balance; incorporate knee flexion/extension machines next session    PT Home Exercise Plan  10/22/18: Quad sets, heel slides, SAQ, 2x10 1x/day; ankle pumps 2 x 30 1x/day; 11/03/18: LAQ 2 x 10 5'' holds; 11/10/18: Heel/toe raises x10, hip abduction x 10 2x/day; 11/24/18: Hamstring stretch 3 x 30 seconds; 12/20: prone quad stretch, supine heel prop, SAQ, SLR, SLS, TKE BTB    Consulted and Agree with Plan of Care  Patient       Patient will benefit from skilled therapeutic intervention in order to improve the following deficits and impairments:  Abnormal gait, Decreased balance, Decreased endurance, Decreased mobility, Difficulty walking, Hypomobility, Decreased range of motion, Decreased scar mobility, Increased edema, Decreased activity tolerance, Decreased strength, Impaired flexibility, Pain  Visit Diagnosis: Acute pain of left knee  Stiffness of left knee, not elsewhere classified  Muscle weakness (generalized)  Other abnormalities of gait and  mobility     Problem List Patient Active Problem List   Diagnosis Date Noted  . 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 S/P left knee arthroscopy 02/05/18 02/12/2018  . Ischemic cardiomyopathy 11/18/2017  . Acute pain of left knee 01/24/2017  . NSTEMI (non-ST elevated myocardial infarction) ([place]) 01/08/2017  . ST elevation myocardial infarction (STEMI) of inferior wall, subsequent episode of care ([place]) 01/08/2017  . Mixed hyperlipidemia 01/08/2017  . Sinus bradycardia 01/08/2017  . Left tennis elbow 11/08/2013  . Stable angina ([place]) 11/08/2013  . CAD in native artery 11/08/2013  . PTSD (post-traumatic stress disorder) 11/08/2013  . Essential hypertension 11/08/2013  . Dyslipidemia 11/08/2013  . Fracture of phalanx of thumb 12/03/2011   Clarene Critchley PT, DPT 10:43 AM, 01/08/19 [place], [place], [address] Phone: [phone]   Fax:  [phone]  Name: Cory  ARLISS [name] MRN: [id number] Date of Birth: Jun 29, 1946

## 2019-01-09 DIAGNOSIS — R69 Illness, unspecified: Secondary | ICD-10-CM | POA: Diagnosis not present

## 2019-01-12 ENCOUNTER — Encounter (HOSPITAL_COMMUNITY): Payer: Self-pay | Admitting: Physical Therapy

## 2019-01-12 ENCOUNTER — Ambulatory Visit (HOSPITAL_COMMUNITY): Payer: No Typology Code available for payment source | Admitting: Physical Therapy

## 2019-01-12 DIAGNOSIS — R2689 Other abnormalities of gait and mobility: Secondary | ICD-10-CM

## 2019-01-12 DIAGNOSIS — M25562 Pain in left knee: Secondary | ICD-10-CM | POA: Diagnosis not present

## 2019-01-12 DIAGNOSIS — M25662 Stiffness of left knee, not elsewhere classified: Secondary | ICD-10-CM

## 2019-01-12 DIAGNOSIS — M6281 Muscle weakness (generalized): Secondary | ICD-10-CM

## 2019-01-12 NOTE — Therapy (Signed)
[place], [place], [address] Phone: [phone]   Fax:  [phone]  Physical Therapy Treatment  Patient Details  Name: Cory [name] MRN: [id number] Date of Birth: [birth year] Referring Provider (PT): Irving Shows, MD   Encounter Date: 01/12/2019  PT End of Session - 01/12/19 0950    Visit Number  19    Number of Visits  24    Date for PT Re-Evaluation  01/29/19   Corrected for mistype   Authorization Type  Primary: VA based on medical necessity limited to 4 modalities per visit; Secondary[place] [employer] HMO    Authorization Time Period  10/22/18 - 12/03/18; New: 01/06/19 - 01/29/19    Authorization - Visit Number  4    Authorization - Number of Visits  15    PT Start Time  0948    PT Stop Time  1026    PT Time Calculation (min)  38 min    Activity Tolerance  Patient tolerated treatment well    Behavior During Therapy  WFL for tasks assessed/performed       Past Medical History:  Diagnosis Date  . Anemia   . Arthritis   . Coronary artery disease    a. 09/2001 PCI/BMS to D1: 2.5 x 15 Express 2 BMS;  b. 2011 Cath: stable anatomy; c. 03/2014 Low risk MV, EF 54%;  d. 09/2016 Inf STEMI/PCI [facility]): LM nl, LAD 28m D1 nl w/ 90% in 136minf branch, LCX nl, RCA 99 (Synergy DES x 4 - 3.5x28 prox, 3.5x3837m.5x16d, 2.25x16 to RPDA), EF 55%.  . GMarland KitchenRD (gastroesophageal reflux disease)   . Hyperlipidemia   . Hypertensive heart disease   . [place]  . Pneumonia   . PTSD (post-traumatic stress disorder)     Past Surgical History:  Procedure Laterality Date  . CARDIAC CATHETERIZATION  05/17/2010   left main free of significant lesion; LAD with 60% lesion in mid segment & 60-70% lesion in mid-distal segment with subtotal occlusion at apex & small side branch of diagonal 1 that is subtotally occluded; ramus intermedius with 90% ostial stenosis; L Cfx w/mild luminal irreg; RCA is  dominant vessel with diffuse disease, 50% prox lesion & 60% lesion in mid segment & 60% lesion in PDA  . CATARACT EXTRACTION W/PHACO Right 06/07/2015   Procedure: CATARACT EXTRACTION PHACO AND INTRAOCULAR LENS PLACEMENT (IOC);  Surgeon: RoyMarylynn PearsonD;  [facility]  Laterality: Right;  . COLONOSCOPY  02/25/2012   Procedure: COLONOSCOPY;  Surgeon: MarJamesetta SoD;  [facility];  Service: Gastroenterology;  Laterality: N/A;  . CORONARY ANGIOPLASTY WITH STENT PLACEMENT  09/2001   Taxus (2.5x15m28mtent to 1st diagonal   . ESOPHAGEAL DILATION N/A 08/14/2016   Procedure: ESOPHAGEAL DILATION;  Surgeon: NajeRogene Houston;  [facility];  Service: Endoscopy;  Laterality: N/A;  . ESOPHAGOGASTRODUODENOSCOPY  02/25/2012   Procedure: ESOPHAGOGASTRODUODENOSCOPY (EGD);  Surgeon: MarkJamesetta So;  [facility];  Service: Gastroenterology;  Laterality: N/A;  . ESOPHAGOGASTRODUODENOSCOPY N/A 07/28/2014   Procedure: ESOPHAGOGASTRODUODENOSCOPY (EGD);  Surgeon: NajeRogene Houston;  [facility];  Service: Endoscopy;  Laterality: N/A;  200[place]  . ESOPHAGOGASTRODUODENOSCOPY N/A 08/14/2016   Procedure: ESOPHAGOGASTRODUODENOSCOPY (EGD);  Surgeon: NajeRogene Houston;  [facility];  Service: Endoscopy;  Laterality: N/A;  100  . EYE SURGERY Bilateral  lasik surgery   . KNEE ARTHROSCOPY WITH MEDIAL MENISECTOMY Left 02/05/2018   Procedure: LEFT KNEE ARTHROSCOPY WITH PARTIAL MEDIAL MENISECTOMY;  Surgeon: Carole Civil, MD;  Location: [facility];  Service: Orthopedics;  Laterality: Left;  . KNEE SURGERY    . LATERAL EPICONDYLE RELEASE Left 01/21/2014   Procedure: Left Tennis Elbow Release with debridement  tendon repair with reattachment;  Surgeon: Carole Civil, MD;  Location: [facility];  Service: Orthopedics;  Laterality: Left;  . penile implant    . PROSTATE SURGERY    . SHOULDER SURGERY    .  TONSILLECTOMY    . TOTAL KNEE ARTHROPLASTY Left 10/19/2018   Procedure: LEFT TOTAL KNEE ARTHROPLASTY;  Surgeon: Vickey Huger, MD;  [facility];  Service: Orthopedics;  Laterality: Left;  with abductor block failed spinal  . TRANSTHORACIC ECHOCARDIOGRAM  03/2010   EF=>55%; LV mildly dilated; LA mod dilated; mild MR & TR; normal RVSP; mild pulm valve regurg    There were no vitals filed for this visit.  Subjective Assessment - 01/12/19 0950    Subjective  Patient reported he is not having any pain today. He stated he has been going to the [facility].     Pertinent History  Left TKA 10/19/18    Limitations  Standing;Walking;House hold activities    How long can you stand comfortably?  Not limited    How long can you walk comfortably?  10 minutes    Patient Stated Goals  Get the knee moving better    Currently in Pain?  No/denies                       [facility] Adult PT Treatment/Exercise - 01/12/19 0001      Knee/Hip Exercises: Stretches   Passive Hamstring Stretch  Left;3 reps;30 seconds    Passive Hamstring Stretch Limitations  On 12 inch step    Gastroc Stretch  Both;3 reps;30 seconds    Gastroc Stretch Limitations  On slant board      Knee/Hip Exercises: Machines for Strengthening   Cybex Knee Extension  2x10 20#     Cybex Leg Press  60#, both legs, 2x10    Other Machine  Resisted ambulation 40# x 10 repetitions forward, 10 repetitions backward      Knee/Hip Exercises: Standing   Lateral Step Up  Left;1 set;Hand Hold: 0;Step Height: 6";Right;15 reps    Forward Step Up  Left;1 set;Step Height: 6";Hand Hold: 0;Right;15 reps    Step Down  1 set;Step Height: 6";Hand Hold: 0;Right;Left;15 reps    Functional Squat  1 set;10 reps    Functional Squat Limitations  Propping right foot up with 2'' step to increase weight bearing in the left lower extremity     SLS with Vectors  5x5'' holds forward/side/back with intermittent HHA    Other Standing Knee Exercises  Tandem stance  each LE forward on foam with shoulder flexion with 3# bar weight x 20             PT Education - 01/12/19 1022    Education Details  Discussed purpose and technique of interventions throughout session.     Person(s) Educated  Patient    Methods  Explanation    Comprehension  Verbalized understanding       PT Short Term Goals - 01/06/19 1323      PT SHORT TERM GOAL #1   Title  Patient will demonstrate understanding and report regular compliance with HEP to improve  knee AROM, lower extremity strength, and overall functional mobility.     Time  3    Period  Weeks    Status  Achieved      PT SHORT TERM GOAL #2   Title  Patient will demonstrate left knee extension/flexion active range of motion of at least 5-95 degrees to assist with more normalized gait pattern and stair ambulation.    Time  3    Period  Weeks    Status  Achieved      PT SHORT TERM GOAL #3   Title  Patient will demonstrate improvement of  MMT grade in all musculature tested as deficient at evaluation to assist with improvement in gait mechanics and stair ambulation.     Time  3    Period  Weeks    Status  Achieved      PT SHORT TERM GOAL #4   Title  Patient will perform single limb stance on left/right lower extremity for 3 seconds in order to assist with stair ambulation.    Time  3    Period  Weeks    Status  Achieved        PT Long Term Goals - 01/06/19 1323      PT LONG TERM GOAL #1   Title  Patient will improve ROM for left knee extension/flexion to 0-120 degrees to improve squatting, and other functional mobility.    Baseline  See objective measures    Time  3    Period  Weeks    Status  On-going    Target Date  01/27/19      PT LONG TERM GOAL #2   Title  Patient will demonstrate improvement of 1 MMT grade in all musculature tested as deficient at evaluation to assist with improvement in gait mechanics and stair ambulation.     Baseline  01/06/19: Patient improved in some, but not all  muscle groups    Time  3    Period  Weeks    Status  Partially Met    Target Date  01/27/19      PT LONG TERM GOAL #3   Title  Patient will demonstrate ability to perform 10 repetitions on the 30 second chair rise indicating improved balance and functional strength.     Baseline  11/17/18: See objective measures    Time  6    Period  Weeks    Status  Achieved      PT LONG TERM GOAL #4   Title  Patient will report ability to ambulate for 10 minutes with least restrictive assistive device with no greater than 3/10 pain for improved ease of performing household activities.    Baseline  11/17/18: Patient reported that he felt he could do this.     Time  6    Period  Weeks    Status  Achieved      PT LONG TERM GOAL #5   Title  Patient will demonstrate ability to ambulate at a gait velocity of at least 0.8 m/s with LRAD on 2MWT indicating improved ability to ambulate safely household and limited community distances.     Baseline  11/17/18: See objective measures    Time  6    Period  Weeks    Status  Achieved      Additional Long Term Goals   Additional Long Term Goals  Yes      PT LONG TERM GOAL #6   Title  Patient will  demonstrate ability to maintain single limb stance on each lower extremity for at least 10 seconds for improved balance and safety with stair negotiation.     Baseline  See objective measures    Time  3    Period  Weeks    Status  On-going    Target Date  01/27/19      PT LONG TERM GOAL #7   Title  Patient will demonstrate ability to perform double leg squat with proper mechanics and equal weight distribution on each lower extremity.    Baseline  01/06/19: Patient demonstrated some weight shift onto the right lower extremity with squatting.     Time  3    Period  Weeks    Status  New    Target Date  01/27/19            Plan - 01/12/19 1016    Clinical Impression Statement  This session continued with a focus on lower extremity strengthening and balance.  This session added knee extension strengthening on the machine. This session also increased the amount of weight patient performed leg press this session. Patient would benefit from continued skilled physical therapy in order to continue progressing towards functional goals.     Rehab Potential  Good    Clinical Impairments Affecting Rehab Potential  Positive: Motivated; Negative: acute    PT Frequency  2x / week    PT Duration  3 weeks    PT Treatment/Interventions  ADLs/Self Care Home Management;Aquatic Therapy;Cryotherapy;Electrical Stimulation;Moist Heat;DME Instruction;Gait training;Stair training;Functional mobility training;Therapeutic activities;Therapeutic exercise;Balance training;Neuromuscular re-education;Patient/family education;Orthotic Fit/Training;Manual techniques;Scar mobilization;Passive range of motion;Dry needling;Energy conservation;Taping    PT Next Visit Plan  Focus on strengthening and balance; incorporate more strengthening machines next session    PT Home Exercise Plan  10/22/18: Quad sets, heel slides, SAQ, 2x10 1x/day; ankle pumps 2 x 30 1x/day; 11/03/18: LAQ 2 x 10 5'' holds; 11/10/18: Heel/toe raises x10, hip abduction x 10 2x/day; 11/24/18: Hamstring stretch 3 x 30 seconds; 12/20: prone quad stretch, supine heel prop, SAQ, SLR, SLS, TKE BTB    Consulted and Agree with Plan of Care  Patient       Patient will benefit from skilled therapeutic intervention in order to improve the following deficits and impairments:  Abnormal gait, Decreased balance, Decreased endurance, Decreased mobility, Difficulty walking, Hypomobility, Decreased range of motion, Decreased scar mobility, Increased edema, Decreased activity tolerance, Decreased strength, Impaired flexibility, Pain  Visit Diagnosis: Acute pain of left knee  Stiffness of left knee, not elsewhere classified  Muscle weakness (generalized)  Other abnormalities of gait and mobility     Problem List Patient Active  Problem List   Diagnosis Date Noted  . 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 S/P left knee arthroscopy 02/05/18 02/12/2018  . Ischemic cardiomyopathy 11/18/2017  . Acute pain of left knee 01/24/2017  . NSTEMI (non-ST elevated myocardial infarction) ([place]) 01/08/2017  . ST elevation myocardial infarction (STEMI) of inferior wall, subsequent episode of care ([place]) 01/08/2017  . Mixed hyperlipidemia 01/08/2017  . Sinus bradycardia 01/08/2017  . Left tennis elbow 11/08/2013  . Stable angina ([place]) 11/08/2013  . CAD in native artery 11/08/2013  . PTSD (post-traumatic stress disorder) 11/08/2013  . Essential hypertension 11/08/2013  . Dyslipidemia 11/08/2013  . Fracture of phalanx of thumb 12/03/2011   Clarene Critchley PT, DPT 10:27 AM, 01/12/19 [place], [place], [address] Phone: [phone]   Fax:  [phone]  Name: Cory [name] MRN: [id number] Date of Birth: 12-10-1946

## 2019-01-13 ENCOUNTER — Encounter (HOSPITAL_COMMUNITY): Payer: No Typology Code available for payment source | Admitting: Physical Therapy

## 2019-01-13 ENCOUNTER — Ambulatory Visit (INDEPENDENT_AMBULATORY_CARE_PROVIDER_SITE_OTHER): Payer: Medicare HMO | Admitting: Internal Medicine

## 2019-01-13 ENCOUNTER — Encounter (INDEPENDENT_AMBULATORY_CARE_PROVIDER_SITE_OTHER): Payer: Self-pay | Admitting: Internal Medicine

## 2019-01-13 VITALS — BP 121/81 | HR 81 | Temp 97.7°F | Ht 69.0 in | Wt 179.0 lb

## 2019-01-13 DIAGNOSIS — K2270 Barrett's esophagus without dysplasia: Secondary | ICD-10-CM

## 2019-01-13 NOTE — Patient Instructions (Signed)
OV in 1 year.  

## 2019-01-13 NOTE — Progress Notes (Signed)
Subjective:    Patient ID: Cory [name], male    DOB: Sep 19, 1946, 73 y.o.   MRN: [id number]  HPI Here today for f/u. Last seen in November of 2018. Hx of Barrett's esophagus. He is doing good. GERD controlled with Protonix. His appetite is good. He has gained 6 pounds since his last visit. He has a BM daily. No melena or  BRRB.   Hx significant for CAD and has 4 stents.  Recent hx of left total knee October 2019  08/14/2016 EGD/ED: dysphagia. Reflux, Barrett's esophagus: Impression: - LA Grade B reflux esophagitis. - Esophageal mucosal changes secondary to  established short-segment Barrett's disease. - Z-line irregular, 38 cm from the incisors. - No endoscopic esophageal abnormality to explain  patient's dysphagia. Esophagus dilated. Dilated. - 2 cm hiatal hernia. - Normal stomach. - Normal duodenal bulb and second portion of the  duodenum.  Review of Systems Past Medical History:  Diagnosis Date  . Anemia   . Arthritis   . Coronary artery disease    a. 09/2001 PCI/BMS to D1: 2.5 x 15 Express 2 BMS;  b. 2011 Cath: stable anatomy; c. 03/2014 Low risk MV, EF 54%;  d. 09/2016 Inf STEMI/PCI [facility]): LM nl, LAD 54m, D1 nl w/ 90% in 53mm inf branch, LCX nl, RCA 99 (Synergy DES x 4 - 3.5x28 prox, 3.5x80m, 2.5x16d, 2.25x16 to RPDA), EF 55%.  Marland Kitchen GERD (gastroesophageal reflux disease)   . Hyperlipidemia   . Hypertensive heart disease   . Myocardial infarction ([place]) 2017  . Pneumonia   . PTSD (post-traumatic stress disorder)     Past Surgical History:  Procedure Laterality Date  . CARDIAC CATHETERIZATION  05/17/2010   left main free of significant lesion; LAD with 60% lesion in mid segment & 60-70% lesion in  mid-distal segment with subtotal occlusion at apex & small side branch of diagonal 1 that is subtotally occluded; ramus intermedius with 90% ostial stenosis; L Cfx w/mild luminal irreg; RCA is dominant vessel with diffuse disease, 50% prox lesion & 60% lesion in mid segment & 60% lesion in PDA  . CATARACT EXTRACTION W/PHACO Right 06/07/2015   Procedure: CATARACT EXTRACTION PHACO AND INTRAOCULAR LENS PLACEMENT (IOC);  Surgeon: Marylynn Pearson, MD;  Location: [place];  Service: Ophthalmology;  Laterality: Right;  . COLONOSCOPY  02/25/2012   Procedure: COLONOSCOPY;  Surgeon: Jamesetta So, MD;  [facility];  Service: Gastroenterology;  Laterality: N/A;  . CORONARY ANGIOPLASTY WITH STENT PLACEMENT  09/2001   Taxus (2.5x97mm) stent to 1st diagonal   . ESOPHAGEAL DILATION N/A 08/14/2016   Procedure: ESOPHAGEAL DILATION;  Surgeon: Rogene Houston, MD;  [facility];  Service: Endoscopy;  Laterality: N/A;  . ESOPHAGOGASTRODUODENOSCOPY  02/25/2012   Procedure: ESOPHAGOGASTRODUODENOSCOPY (EGD);  Surgeon: Jamesetta So, MD;  [facility];  Service: Gastroenterology;  Laterality: N/A;  . ESOPHAGOGASTRODUODENOSCOPY N/A 07/28/2014   Procedure: ESOPHAGOGASTRODUODENOSCOPY (EGD);  Surgeon: Rogene Houston, MD;  [facility];  Service: Endoscopy;  Laterality: N/A;  200-rescheduled to [place] notified pt  . ESOPHAGOGASTRODUODENOSCOPY N/A 08/14/2016   Procedure: ESOPHAGOGASTRODUODENOSCOPY (EGD);  Surgeon: Rogene Houston, MD;  [facility];  Service: Endoscopy;  Laterality: N/A;  100  . EYE SURGERY Bilateral    lasik surgery   . KNEE ARTHROSCOPY WITH MEDIAL MENISECTOMY Left 02/05/2018   Procedure: LEFT KNEE ARTHROSCOPY WITH PARTIAL MEDIAL MENISECTOMY;  Surgeon: Carole Civil, MD;  [facility];  Service: Orthopedics;  Laterality: Left;  . KNEE SURGERY    . LATERAL EPICONDYLE RELEASE Left 01/21/2014   Procedure: Left Tennis Elbow Release with debridement  tendon  repair with reattachment;  Surgeon: Carole Civil, MD;  Location: [facility];  Service: Orthopedics;  Laterality: Left;  . penile implant    . PROSTATE SURGERY    . SHOULDER SURGERY    . TONSILLECTOMY    . TOTAL KNEE ARTHROPLASTY Left 10/19/2018   Procedure: LEFT TOTAL KNEE ARTHROPLASTY;  Surgeon: Vickey Huger, MD;  [facility];  Service: Orthopedics;  Laterality: Left;  with abductor block failed spinal  . TRANSTHORACIC ECHOCARDIOGRAM  03/2010   EF=>55%; LV mildly dilated; LA mod dilated; mild MR & TR; normal RVSP; mild pulm valve regurg    Allergies  Allergen Reactions  . Ace Inhibitors Other (See Comments)    unknown  . Benadryl [Diphenhydramine Hcl] Other (See Comments)    Opposite reaction of medication purpose - hyper  . Vioxx [Rofecoxib] Other (See Comments)    headache    Current Outpatient Medications on File Prior to Visit  Medication Sig Dispense Refill  . acetaminophen (TYLENOL) 325 MG tablet Take 650 mg by mouth daily as needed for moderate pain or headache.    Marland Kitchen aspirin EC 325 MG EC tablet Take 1 tablet (325 mg total) by mouth 2 (two) times daily. (Patient taking differently: Take 325 mg by mouth daily. ) 30 tablet 0  . buPROPion (WELLBUTRIN SR) 100 MG 12 hr tablet Take 100 mg by mouth daily.    . fluticasone (FLONASE) 50 MCG/ACT nasal spray Place 1 spray into both nostrils daily as needed for allergies or rhinitis.    Marland Kitchen irbesartan (AVAPRO) 300 MG tablet Take 1 tablet (300 mg total) daily by mouth. 90 tablet 3  . loratadine (CLARITIN) 10 MG tablet Take 10 mg by mouth daily.     . Magnesium Malate 1250 (141.7 Mg) MG TABS Take 1,250 mg by mouth daily.    . methocarbamol (ROBAXIN) 500 MG tablet Take 1-2 tablets (500-1,000 mg total) by mouth every 6 (six) hours as needed for muscle spasms. 60 tablet 0  . oxyCODONE (OXY IR/ROXICODONE) 5 MG immediate release tablet Take 1-2 tablets (5-10 mg total) by mouth every 6 (six) hours as needed for moderate pain (pain score 4-6).  50 tablet 0  . pantoprazole (PROTONIX) 40 MG tablet Take 1 tablet (40 mg total) by mouth 2 (two) times daily before a meal. 180 tablet 1  . traZODone (DESYREL) 100 MG tablet Take 100 mg by mouth at bedtime.     Marland Kitchen venlafaxine (EFFEXOR) 75 MG tablet Take 75 mg by mouth daily.    . carvedilol (COREG) 3.125 MG tablet Take 1 tablet (3.125 mg total) by mouth 2 (two) times daily. 180 tablet 3   No current facility-administered medications on file prior to visit.         Objective:   Physical Exam Blood pressure 121/81, pulse 81, temperature 97.7 F (36.5 C), height 5\' 9"  (1.753 m), weight 179 lb (81.2 kg). Alert and oriented. Skin warm and dry. Oral mucosa is moist.   . Sclera anicteric, conjunctivae is pink. Thyroid not enlarged. No cervical lymphadenopathy. Lungs clear. Heart regular rate and rhythm.  Abdomen is soft. Bowel sounds are positive. No hepatomegaly. No abdominal masses felt. No tenderness.  No edema to lower extremities.          Assessment & Plan:  Barrett's . He is doing well. He will continue  the Protonix. Needs surveillanceEGD in 2022.

## 2019-01-14 ENCOUNTER — Ambulatory Visit (HOSPITAL_COMMUNITY): Payer: No Typology Code available for payment source | Admitting: Physical Therapy

## 2019-01-14 ENCOUNTER — Encounter (HOSPITAL_COMMUNITY): Payer: Self-pay | Admitting: Physical Therapy

## 2019-01-14 DIAGNOSIS — M25562 Pain in left knee: Secondary | ICD-10-CM

## 2019-01-14 DIAGNOSIS — R2689 Other abnormalities of gait and mobility: Secondary | ICD-10-CM

## 2019-01-14 DIAGNOSIS — M25662 Stiffness of left knee, not elsewhere classified: Secondary | ICD-10-CM

## 2019-01-14 DIAGNOSIS — M6281 Muscle weakness (generalized): Secondary | ICD-10-CM

## 2019-01-14 NOTE — Therapy (Signed)
[place], [place], [address] Phone: [phone]   Fax:  [phone]  Physical Therapy Treatment  Patient Details  Name: Cory [name] MRN: [id number] Date of Birth: Dec 28, 1946 Referring Provider (PT): Irving Shows, MD   Encounter Date: 01/14/2019  PT End of Session - 01/14/19 1435    Visit Number  20    Number of Visits  24    Date for PT Re-Evaluation  01/29/19   Corrected for mistype   Authorization Type  Primary: VA based on medical necessity limited to 4 modalities per visit; SecondaryHolland [employer]alling [employer] HMO    Authorization Time Period  10/22/18 - 12/03/18; New: 01/06/19 - 01/29/19    Authorization - Visit Number  5    Authorization - Number of Visits  15    PT Start Time  1435    PT Stop Time  1513    PT Time Calculation (min)  38 min    Activity Tolerance  Patient tolerated treatment well    Behavior During Therapy  WFL for tasks assessed/performed       Past Medical History:  Diagnosis Date  . Anemia   . Arthritis   . Coronary artery disease    a. 09/2001 PCI/BMS to D1: 2.5 x 15 Express 2 BMS;  b. 2011 Cath: stable anatomy; c. 03/2014 Low risk MV, EF 54%;  d. 09/2016 Inf STEMI/PCI [facility] LM nl, LAD 19m D1 nl w/ 90% in 133minf branch, LCX nl, RCA 99 (Synergy DES x 4 - 3.5x28 prox, 3.5x3846m.5x16d, 2.25x16 to RPDA), EF 55%.  . GMarland KitchenRD (gastroesophageal reflux disease)   . Hyperlipidemia   . Hypertensive heart disease   . Myocardial infarction ([facility]  . Pneumonia   . PTSD (post-traumatic stress disorder)     Past Surgical History:  Procedure Laterality Date  . CARDIAC CATHETERIZATION  05/17/2010   left main free of significant lesion; LAD with 60% lesion in mid segment & 60-70% lesion in mid-distal segment with subtotal occlusion at apex & small side branch of diagonal 1 that is subtotally occluded; ramus intermedius with 90% ostial stenosis; L Cfx w/mild luminal irreg; RCA is  dominant vessel with diffuse disease, 50% prox lesion & 60% lesion in mid segment & 60% lesion in PDA  . CATARACT EXTRACTION W/PHACO Right 06/07/2015   Procedure: CATARACT EXTRACTION PHACO AND INTRAOCULAR LENS PLACEMENT (IOC);  Surgeon: RoyMarylynn PearsonD;  [facility];  Laterality: Right;  . COLONOSCOPY  02/25/2012   Procedure: COLONOSCOPY;  Surgeon: MarJamesetta SoD;  [facility];  Service: Gastroenterology;  Laterality: N/A;  . CORONARY ANGIOPLASTY WITH STENT PLACEMENT  09/2001   Taxus (2.5x15m72mtent to 1st diagonal   . ESOPHAGEAL DILATION N/A 08/14/2016   Procedure: ESOPHAGEAL DILATION;  Surgeon: NajeRogene Houston;  [facility];  Service: Endoscopy;  Laterality: N/A;  . ESOPHAGOGASTRODUODENOSCOPY  02/25/2012   Procedure: ESOPHAGOGASTRODUODENOSCOPY (EGD);  Surgeon: MarkJamesetta So;  [facility];  Service: Gastroenterology;  Laterality: N/A;  . ESOPHAGOGASTRODUODENOSCOPY N/A 07/28/2014   Procedure: ESOPHAGOGASTRODUODENOSCOPY (EGD);  Surgeon: NajeRogene Houston;  [facility];  Service: Endoscopy;  Laterality: N/A;  200-rescheduled to 245 [place]  . ESOPHAGOGASTRODUODENOSCOPY N/A 08/14/2016   Procedure: ESOPHAGOGASTRODUODENOSCOPY (EGD);  Surgeon: NajeRogene Houston;  [facility];  Service: Endoscopy;  Laterality: N/A;  100  . EYE SURGERY Bilateral  lasik surgery   . KNEE ARTHROSCOPY WITH MEDIAL MENISECTOMY Left 02/05/2018   Procedure: LEFT KNEE ARTHROSCOPY WITH PARTIAL MEDIAL MENISECTOMY;  Surgeon: Carole Civil, MD;  Location: [facility];  Service: Orthopedics;  Laterality: Left;  . KNEE SURGERY    . LATERAL EPICONDYLE RELEASE Left 01/21/2014   Procedure: Left Tennis Elbow Release with debridement  tendon repair with reattachment;  Surgeon: Carole Civil, MD;  Location: [facility];  Service: Orthopedics;  Laterality: Left;  . penile implant    . PROSTATE SURGERY    . SHOULDER SURGERY    .  TONSILLECTOMY    . TOTAL KNEE ARTHROPLASTY Left 10/19/2018   Procedure: LEFT TOTAL KNEE ARTHROPLASTY;  Surgeon: Vickey Huger, MD;  [facility];  Service: Orthopedics;  Laterality: Left;  with abductor block failed spinal  . TRANSTHORACIC ECHOCARDIOGRAM  03/2010   EF=>55%; LV mildly dilated; LA mod dilated; mild MR & TR; normal RVSP; mild pulm valve regurg    There were no vitals filed for this visit.  Subjective Assessment - 01/14/19 1434    Subjective  Patient reported that he is not having any pain this session.     Pertinent History  Left TKA 10/19/18    Limitations  Standing;Walking;House hold activities    How long can you stand comfortably?  Not limited    How long can you walk comfortably?  10 minutes    Patient Stated Goals  Get the knee moving better    Currently in Pain?  No/denies                       [facility] Adult PT Treatment/Exercise - 01/14/19 0001      Knee/Hip Exercises: Stretches   Passive Hamstring Stretch  Left;3 reps;30 seconds    Passive Hamstring Stretch Limitations  On 12 inch step    Gastroc Stretch  Both;3 reps;30 seconds    Gastroc Stretch Limitations  On slant board      Knee/Hip Exercises: Machines for Strengthening   Cybex Knee Extension  2x10 20#     Cybex Leg Press  50# first set 60# 2nd set, both legs, 2x10    Other Machine  Resisted ambulation 50# x 10 repetitions forward, 10 repetitions backward      Knee/Hip Exercises: Standing   Lateral Step Up  Left;1 set;Hand Hold: 0;Step Height: 6";Right;15 reps    Forward Step Up  Left;1 set;Step Height: 6";Hand Hold: 0;Right;15 reps    Step Down  1 set;Step Height: 6";Hand Hold: 0;Right;Left;15 reps    SLS with Vectors  5x5'' holds forward/side/back with intermittent HHA      Knee/Hip Exercises: Seated   Other Seated Knee/Hip Exercises  Stool scoots 2 x forward and back 15 feet               PT Short Term Goals - 01/06/19 1323      PT SHORT TERM GOAL #1   Title   Patient will demonstrate understanding and report regular compliance with HEP to improve knee AROM, lower extremity strength, and overall functional mobility.     Time  3    Period  Weeks    Status  Achieved      PT SHORT TERM GOAL #2   Title  Patient will demonstrate left knee extension/flexion active range of motion of at least 5-95 degrees to assist with more normalized gait pattern and stair ambulation.    Time  3    Period  Weeks  Status  Achieved      PT SHORT TERM GOAL #3   Title  Patient will demonstrate improvement of  MMT grade in all musculature tested as deficient at evaluation to assist with improvement in gait mechanics and stair ambulation.     Time  3    Period  Weeks    Status  Achieved      PT SHORT TERM GOAL #4   Title  Patient will perform single limb stance on left/right lower extremity for 3 seconds in order to assist with stair ambulation.    Time  3    Period  Weeks    Status  Achieved        PT Long Term Goals - 01/06/19 1323      PT LONG TERM GOAL #1   Title  Patient will improve ROM for left knee extension/flexion to 0-120 degrees to improve squatting, and other functional mobility.    Baseline  See objective measures    Time  3    Period  Weeks    Status  On-going    Target Date  01/27/19      PT LONG TERM GOAL #2   Title  Patient will demonstrate improvement of 1 MMT grade in all musculature tested as deficient at evaluation to assist with improvement in gait mechanics and stair ambulation.     Baseline  01/06/19: Patient improved in some, but not all muscle groups    Time  3    Period  Weeks    Status  Partially Met    Target Date  01/27/19      PT LONG TERM GOAL #3   Title  Patient will demonstrate ability to perform 10 repetitions on the 30 second chair rise indicating improved balance and functional strength.     Baseline  11/17/18: See objective measures    Time  6    Period  Weeks    Status  Achieved      PT LONG TERM GOAL #4    Title  Patient will report ability to ambulate for 10 minutes with least restrictive assistive device with no greater than 3/10 pain for improved ease of performing household activities.    Baseline  11/17/18: Patient reported that he felt he could do this.     Time  6    Period  Weeks    Status  Achieved      PT LONG TERM GOAL #5   Title  Patient will demonstrate ability to ambulate at a gait velocity of at least 0.8 m/s with LRAD on 2MWT indicating improved ability to ambulate safely household and limited community distances.     Baseline  11/17/18: See objective measures    Time  6    Period  Weeks    Status  Achieved      Additional Long Term Goals   Additional Long Term Goals  Yes      PT LONG TERM GOAL #6   Title  Patient will demonstrate ability to maintain single limb stance on each lower extremity for at least 10 seconds for improved balance and safety with stair negotiation.     Baseline  See objective measures    Time  3    Period  Weeks    Status  On-going    Target Date  01/27/19      PT LONG TERM GOAL #7   Title  Patient will demonstrate ability to perform double leg squat with  proper mechanics and equal weight distribution on each lower extremity.    Baseline  01/06/19: Patient demonstrated some weight shift onto the right lower extremity with squatting.     Time  3    Period  Weeks    Status  New    Target Date  01/27/19            Plan - 01/14/19 1454    Clinical Impression Statement  This session continued with a  focus on improving patient's balance and strength. Continued to progress patient by increasing weight with resisted walking to 50#. This session also added stool scoots to improve patient's hamstring and quadriceps strength. Patient has continued to demonstrate good progress with therapy. Plan to continue progression with strength and balance.     Rehab Potential  Good    Clinical Impairments Affecting Rehab Potential  Positive: Motivated; Negative:  acute    PT Frequency  2x / week    PT Duration  3 weeks    PT Treatment/Interventions  ADLs/Self Care Home Management;Aquatic Therapy;Cryotherapy;Electrical Stimulation;Moist Heat;DME Instruction;Gait training;Stair training;Functional mobility training;Therapeutic activities;Therapeutic exercise;Balance training;Neuromuscular re-education;Patient/family education;Orthotic Fit/Training;Manual techniques;Scar mobilization;Passive range of motion;Dry needling;Energy conservation;Taping    PT Next Visit Plan  Focus on strengthening and balance; incorporate more strengthening exercises next session.     PT Home Exercise Plan  10/22/18: Quad sets, heel slides, SAQ, 2x10 1x/day; ankle pumps 2 x 30 1x/day; 11/03/18: LAQ 2 x 10 5'' holds; 11/10/18: Heel/toe raises x10, hip abduction x 10 2x/day; 11/24/18: Hamstring stretch 3 x 30 seconds; 12/20: prone quad stretch, supine heel prop, SAQ, SLR, SLS, TKE BTB    Consulted and Agree with Plan of Care  Patient       Patient will benefit from skilled therapeutic intervention in order to improve the following deficits and impairments:  Abnormal gait, Decreased balance, Decreased endurance, Decreased mobility, Difficulty walking, Hypomobility, Decreased range of motion, Decreased scar mobility, Increased edema, Decreased activity tolerance, Decreased strength, Impaired flexibility, Pain  Visit Diagnosis: Acute pain of left knee  Stiffness of left knee, not elsewhere classified  Muscle weakness (generalized)  Other abnormalities of gait and mobility     Problem List Patient Active Problem List   Diagnosis Date Noted  . 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 S/P left knee arthroscopy 02/05/18 02/12/2018  . Ischemic cardiomyopathy 11/18/2017  . Acute pain of left knee 01/24/2017  . NSTEMI (non-ST elevated myocardial infarction) ([place]) 01/08/2017  . ST elevation myocardial infarction (STEMI) of inferior wall, subsequent episode of care ([place]) 01/08/2017  .  Mixed hyperlipidemia 01/08/2017  . Sinus bradycardia 01/08/2017  . Left tennis elbow 11/08/2013  . Stable angina ([place]) 11/08/2013  . CAD in native artery 11/08/2013  . PTSD (post-traumatic stress disorder) 11/08/2013  . Essential hypertension 11/08/2013  . Dyslipidemia 11/08/2013  . Fracture of phalanx of thumb 12/03/2011   Clarene Critchley PT, DPT 3:20 PM, 01/14/19 [place], [place], [address] Phone: [phone]   Fax:  [phone]  Name: Cory [name] MRN: [id number] Date of Birth: [birth year]

## 2019-01-19 ENCOUNTER — Encounter (HOSPITAL_COMMUNITY): Payer: Self-pay | Admitting: Physical Therapy

## 2019-01-19 ENCOUNTER — Ambulatory Visit (HOSPITAL_COMMUNITY): Payer: No Typology Code available for payment source | Admitting: Physical Therapy

## 2019-01-19 DIAGNOSIS — R2689 Other abnormalities of gait and mobility: Secondary | ICD-10-CM

## 2019-01-19 DIAGNOSIS — M25562 Pain in left knee: Secondary | ICD-10-CM

## 2019-01-19 DIAGNOSIS — M6281 Muscle weakness (generalized): Secondary | ICD-10-CM

## 2019-01-19 DIAGNOSIS — M25662 Stiffness of left knee, not elsewhere classified: Secondary | ICD-10-CM

## 2019-01-19 NOTE — Therapy (Signed)
[place], [place], [address] Phone: [phone]   Fax:  [phone]  Physical Therapy Treatment  Patient Details  Name: Cory [name] MRN: [id number] Date of Birth: Jul 30, 1946 Referring Provider (PT): Irving Shows, MD   Encounter Date: 01/19/2019  PT End of Session - 01/19/19 1014    Visit Number  21    Number of Visits  24    Date for PT Re-Evaluation  01/29/19   Corrected for mistype   Authorization Type  Primary: VA based on medical necessity limited to 4 modalities per visit; SecondaryHolland Falling Medicare HMO    Authorization Time Period  10/22/18 - 12/03/18; New: 01/06/19 - 01/29/19    Authorization - Visit Number  6    Authorization - Number of Visits  15    PT Start Time  0951    PT Stop Time  1030    PT Time Calculation (min)  39 min    Activity Tolerance  Patient tolerated treatment well    Behavior During Therapy  WFL for tasks assessed/performed       Past Medical History:  Diagnosis Date  . Anemia   . Arthritis   . Coronary artery disease    a. 09/2001 PCI/BMS to D1: 2.5 x 15 Express 2 BMS;  b. 2011 Cath: stable anatomy; c. 03/2014 Low risk MV, EF 54%;  d. 09/2016 Inf STEMI/PCI [facility]): LM nl, LAD 63m D1 nl w/ 90% in 139minf branch, LCX nl, RCA 99 (Synergy DES x 4 - 3.5x28 prox, 3.5x3822m.5x16d, 2.25x16 to RPDA), EF 55%.  . GMarland KitchenRD (gastroesophageal reflux disease)   . Hyperlipidemia   . Hypertensive heart disease   . Myocardial infarction ([facility]017  . Pneumonia   . PTSD (post-traumatic stress disorder)     Past Surgical History:  Procedure Laterality Date  . CARDIAC CATHETERIZATION  05/17/2010   left main free of significant lesion; LAD with 60% lesion in mid segment & 60-70% lesion in mid-distal segment with subtotal occlusion at apex & small side branch of diagonal 1 that is subtotally occluded; ramus intermedius with 90% ostial stenosis; L Cfx w/mild luminal irreg; RCA is  dominant vessel with diffuse disease, 50% prox lesion & 60% lesion in mid segment & 60% lesion in PDA  . CATARACT EXTRACTION W/PHACO Right 06/07/2015   Procedure: CATARACT EXTRACTION PHACO AND INTRAOCULAR LENS PLACEMENT (IOC);  Surgeon: RoyMarylynn PearsonD;  [facility];  Laterality: Right;  . COLONOSCOPY  02/25/2012   Procedure: COLONOSCOPY;  Surgeon: MarJamesetta SoD;  [facility];  Service: Gastroenterology;  Laterality: N/A;  . CORONARY ANGIOPLASTY WITH STENT PLACEMENT  09/2001   Taxus (2.5x15m84mtent to 1st diagonal   . ESOPHAGEAL DILATION N/A 08/14/2016   Procedure: ESOPHAGEAL DILATION;  Surgeon: NajeRogene Houston;  [facility];  Service: Endoscopy;  Laterality: N/A;  . ESOPHAGOGASTRODUODENOSCOPY  02/25/2012   Procedure: ESOPHAGOGASTRODUODENOSCOPY (EGD);  Surgeon: MarkJamesetta So;  [facility];  Service: Gastroenterology;  Laterality: N/A;  . ESOPHAGOGASTRODUODENOSCOPY N/A 07/28/2014   Procedure: ESOPHAGOGASTRODUODENOSCOPY (EGD);  Surgeon: NajeRogene Houston;  [facility];  Service: Endoscopy;  Laterality: N/A;  200-rescheduled to 245 [place] pt  . ESOPHAGOGASTRODUODENOSCOPY N/A 08/14/2016   Procedure: ESOPHAGOGASTRODUODENOSCOPY (EGD);  Surgeon: NajeRogene Houston;  [facility];  Service: Endoscopy;  Laterality: N/A;  100  . EYE SURGERY Bilateral  lasik surgery   . KNEE ARTHROSCOPY WITH MEDIAL MENISECTOMY Left 02/05/2018   Procedure: LEFT KNEE ARTHROSCOPY WITH PARTIAL MEDIAL MENISECTOMY;  Surgeon: Carole Civil, MD;  [facility];  Service: Orthopedics;  Laterality: Left;  . KNEE SURGERY    . LATERAL EPICONDYLE RELEASE Left 01/21/2014   Procedure: Left Tennis Elbow Release with debridement  tendon repair with reattachment;  Surgeon: Carole Civil, MD;  [facility];  Service: Orthopedics;  Laterality: Left;  . penile implant    . PROSTATE SURGERY    . SHOULDER SURGERY    .  TONSILLECTOMY    . TOTAL KNEE ARTHROPLASTY Left 10/19/2018   Procedure: LEFT TOTAL KNEE ARTHROPLASTY;  Surgeon: Vickey Huger, MD;  [facility];  Service: Orthopedics;  Laterality: Left;  with abductor block failed spinal  . TRANSTHORACIC ECHOCARDIOGRAM  03/2010   EF=>55%; LV mildly dilated; LA mod dilated; mild MR & TR; normal RVSP; mild pulm valve regurg    There were no vitals filed for this visit.  Subjective Assessment - 01/19/19 0952    Subjective  Patient stated that he is not having any pain this session.    Pertinent History  Left TKA 10/19/18    Limitations  Standing;Walking;House hold activities    How long can you stand comfortably?  Not limited    How long can you walk comfortably?  10 minutes    Patient Stated Goals  Get the knee moving better    Currently in Pain?  No/denies                       [facility] Adult PT Treatment/Exercise - 01/19/19 0001      Knee/Hip Exercises: Stretches   Passive Hamstring Stretch  Left;3 reps;30 seconds    Passive Hamstring Stretch Limitations  On 12 inch step    Gastroc Stretch  Both;3 reps;30 seconds    Gastroc Stretch Limitations  On slant board      Knee/Hip Exercises: Machines for Strengthening   Cybex Knee Extension  2x10 20#     Cybex Leg Press  60# both legs, 2x10    Other Machine  Resisted ambulation 50# x 10 repetitions forward, 10 repetitions backward      Knee/Hip Exercises: Standing   SLS with Vectors  5x5'' holds forward/side/back with intermittent HHA    Other Standing Knee Exercises  Tandem ambulation on foam 15 feet x 3 roundtrips. Tandem stance each LE forward on foam with shoulder flexion with 3# bar weight x 20    Other Standing Knee Exercises  Sidestep with minisqaut with RTB 15 feet x 3 roundtrips      Knee/Hip Exercises: Seated   Other Seated Knee/Hip Exercises  Stool scoots 2 x forward and back 15 feet               PT Short Term Goals - 01/06/19 1323      PT SHORT TERM GOAL #1    Title  Patient will demonstrate understanding and report regular compliance with HEP to improve knee AROM, lower extremity strength, and overall functional mobility.     Time  3    Period  Weeks    Status  Achieved      PT SHORT TERM GOAL #2   Title  Patient will demonstrate left knee extension/flexion active range of motion of at least 5-95 degrees to assist with more normalized gait pattern and stair ambulation.    Time  3  Period  Weeks    Status  Achieved      PT SHORT TERM GOAL #3   Title  Patient will demonstrate improvement of  MMT grade in all musculature tested as deficient at evaluation to assist with improvement in gait mechanics and stair ambulation.     Time  3    Period  Weeks    Status  Achieved      PT SHORT TERM GOAL #4   Title  Patient will perform single limb stance on left/right lower extremity for 3 seconds in order to assist with stair ambulation.    Time  3    Period  Weeks    Status  Achieved        PT Long Term Goals - 01/06/19 1323      PT LONG TERM GOAL #1   Title  Patient will improve ROM for left knee extension/flexion to 0-120 degrees to improve squatting, and other functional mobility.    Baseline  See objective measures    Time  3    Period  Weeks    Status  On-going    Target Date  01/27/19      PT LONG TERM GOAL #2   Title  Patient will demonstrate improvement of 1 MMT grade in all musculature tested as deficient at evaluation to assist with improvement in gait mechanics and stair ambulation.     Baseline  01/06/19: Patient improved in some, but not all muscle groups    Time  3    Period  Weeks    Status  Partially Met    Target Date  01/27/19      PT LONG TERM GOAL #3   Title  Patient will demonstrate ability to perform 10 repetitions on the 30 second chair rise indicating improved balance and functional strength.     Baseline  11/17/18: See objective measures    Time  6    Period  Weeks    Status  Achieved      PT LONG TERM  GOAL #4   Title  Patient will report ability to ambulate for 10 minutes with least restrictive assistive device with no greater than 3/10 pain for improved ease of performing household activities.    Baseline  11/17/18: Patient reported that he felt he could do this.     Time  6    Period  Weeks    Status  Achieved      PT LONG TERM GOAL #5   Title  Patient will demonstrate ability to ambulate at a gait velocity of at least 0.8 m/s with LRAD on 2MWT indicating improved ability to ambulate safely household and limited community distances.     Baseline  11/17/18: See objective measures    Time  6    Period  Weeks    Status  Achieved      Additional Long Term Goals   Additional Long Term Goals  Yes      PT LONG TERM GOAL #6   Title  Patient will demonstrate ability to maintain single limb stance on each lower extremity for at least 10 seconds for improved balance and safety with stair negotiation.     Baseline  See objective measures    Time  3    Period  Weeks    Status  On-going    Target Date  01/27/19      PT LONG TERM GOAL #7   Title  Patient will demonstrate ability  to perform double leg squat with proper mechanics and equal weight distribution on each lower extremity.    Baseline  01/06/19: Patient demonstrated some weight shift onto the right lower extremity with squatting.     Time  3    Period  Weeks    Status  New    Target Date  01/27/19            Plan - 01/19/19 1017    Clinical Impression Statement  This session continued with a focus on strengthening and balance. Added minisquats with sidestepping with session with resistance band to improve patient's strength. Also added lunges this session. Patient has continued to progress well with therapy. Plan to continue progression of balance and strength at next session.    Rehab Potential  Good    Clinical Impairments Affecting Rehab Potential  Positive: Motivated; Negative: acute    PT Frequency  2x / week    PT  Duration  3 weeks    PT Treatment/Interventions  ADLs/Self Care Home Management;Aquatic Therapy;Cryotherapy;Electrical Stimulation;Moist Heat;DME Instruction;Gait training;Stair training;Functional mobility training;Therapeutic activities;Therapeutic exercise;Balance training;Neuromuscular re-education;Patient/family education;Orthotic Fit/Training;Manual techniques;Scar mobilization;Passive range of motion;Dry needling;Energy conservation;Taping    PT Next Visit Plan  Focus on strengthening and balance; incorporate more strengthening exercises next session.     PT Home Exercise Plan  10/22/18: Quad sets, heel slides, SAQ, 2x10 1x/day; ankle pumps 2 x 30 1x/day; 11/03/18: LAQ 2 x 10 5'' holds; 11/10/18: Heel/toe raises x10, hip abduction x 10 2x/day; 11/24/18: Hamstring stretch 3 x 30 seconds; 12/20: prone quad stretch, supine heel prop, SAQ, SLR, SLS, TKE BTB    Consulted and Agree with Plan of Care  Patient       Patient will benefit from skilled therapeutic intervention in order to improve the following deficits and impairments:  Abnormal gait, Decreased balance, Decreased endurance, Decreased mobility, Difficulty walking, Hypomobility, Decreased range of motion, Decreased scar mobility, Increased edema, Decreased activity tolerance, Decreased strength, Impaired flexibility, Pain  Visit Diagnosis: Acute pain of left knee  Stiffness of left knee, not elsewhere classified  Muscle weakness (generalized)  Other abnormalities of gait and mobility     Problem List Patient Active Problem List   Diagnosis Date Noted  . 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 S/P left knee arthroscopy 02/05/18 02/12/2018  . Ischemic cardiomyopathy 11/18/2017  . Acute pain of left knee 01/24/2017  . NSTEMI (non-ST elevated myocardial infarction) ([place]) 01/08/2017  . ST elevation myocardial infarction (STEMI) of inferior wall, subsequent episode of care ([place]) 01/08/2017  . Mixed hyperlipidemia 01/08/2017  .  Sinus bradycardia 01/08/2017  . Left tennis elbow 11/08/2013  . Stable angina ([place]) 11/08/2013  . CAD in native artery 11/08/2013  . PTSD (post-traumatic stress disorder) 11/08/2013  . Essential hypertension 11/08/2013  . Dyslipidemia 11/08/2013  . Fracture of phalanx of thumb 12/03/2011   Clarene Critchley PT, DPT 10:31 AM, 01/19/19 [place], [place], [address] Phone: [phone]   Fax:  [phone]  Name: Cory [name] MRN: [id number] Date of Birth: 08-03-1946

## 2019-01-21 ENCOUNTER — Ambulatory Visit (HOSPITAL_COMMUNITY): Payer: No Typology Code available for payment source | Admitting: Physical Therapy

## 2019-01-21 ENCOUNTER — Encounter (HOSPITAL_COMMUNITY): Payer: Self-pay | Admitting: Physical Therapy

## 2019-01-21 DIAGNOSIS — M25662 Stiffness of left knee, not elsewhere classified: Secondary | ICD-10-CM

## 2019-01-21 DIAGNOSIS — R2689 Other abnormalities of gait and mobility: Secondary | ICD-10-CM

## 2019-01-21 DIAGNOSIS — M25562 Pain in left knee: Secondary | ICD-10-CM

## 2019-01-21 DIAGNOSIS — M6281 Muscle weakness (generalized): Secondary | ICD-10-CM

## 2019-01-21 NOTE — Therapy (Signed)
[place], [place], [address] Phone: [phone]   Fax:  [phone]  Physical Therapy Treatment  Patient Details  Name: Cory [name] MRN: [id number] Date of Birth: Feb 01, 1946 Referring Provider (PT): Irving Shows, MD   Encounter Date: 01/21/2019  PT End of Session - 01/21/19 1353    Visit Number  22    Number of Visits  24    Date for PT Re-Evaluation  01/29/19   Corrected for mistype   Authorization Type  Primary: VA based on medical necessity limited to 4 modalities per visit; Secondary[place] HMO    Authorization Time Period  10/22/18 - 12/03/18; New: 01/06/19 - 01/29/19    Authorization - Visit Number  7    Authorization - Number of Visits  15    PT Start Time  1346    PT Stop Time  1425    PT Time Calculation (min)  39 min    Activity Tolerance  Patient tolerated treatment well    Behavior During Therapy  WFL for tasks assessed/performed       Past Medical History:  Diagnosis Date  . Anemia   . Arthritis   . Coronary artery disease    a. 09/2001 PCI/BMS to D1: 2.5 x 15 Express 2 BMS;  b. 2011 Cath: stable anatomy; c. 03/2014 Low risk MV, EF 54%;  d. 09/2016 Inf STEMI/PCI [facility]): LM nl, LAD 25m D1 nl w/ 90% in 152minf branch, LCX nl, RCA 99 (Synergy DES x 4 - 3.5x28 prox, 3.5x3865m.5x16d, 2.25x16 to RPDA), EF 55%.  . GMarland KitchenRD (gastroesophageal reflux disease)   . Hyperlipidemia   . Hypertensive heart disease   . [place]ocardial infarction ([place]  . Pneumonia   . PTSD (post-traumatic stress disorder)     Past Surgical History:  Procedure Laterality Date  . CARDIAC CATHETERIZATION  05/17/2010   left main free of significant lesion; LAD with 60% lesion in mid segment & 60-70% lesion in mid-distal segment with subtotal occlusion at apex & small side branch of diagonal 1 that is subtotally occluded; ramus intermedius with 90% ostial stenosis; L Cfx w/mild luminal irreg; RCA is  dominant vessel with diffuse disease, 50% prox lesion & 60% lesion in mid segment & 60% lesion in PDA  . CATARACT EXTRACTION W/PHACO Right 06/07/2015   Procedure: CATARACT EXTRACTION PHACO AND INTRAOCULAR LENS PLACEMENT (IOC);  Surgeon: RoyMarylynn PearsonD;  [facility]  Laterality: Right;  . COLONOSCOPY  02/25/2012   Procedure: COLONOSCOPY;  Surgeon: MarJamesetta SoD;  [facility];  Service: Gastroenterology;  Laterality: N/A;  . CORONARY ANGIOPLASTY WITH STENT PLACEMENT  09/2001   [place]us (2.5x15m35mtent to 1st diagonal   . ESOPHAGEAL DILATION N/A 08/14/2016   Procedure: ESOPHAGEAL DILATION;  Surgeon: NajeRogene Houston;  [facility];  Service: Endoscopy;  Laterality: N/A;  . ESOPHAGOGASTRODUODENOSCOPY  02/25/2012   Procedure: ESOPHAGOGASTRODUODENOSCOPY (EGD);  Surgeon: MarkJamesetta So;  [facility];  Service: Gastroenterology;  Laterality: N/A;  . ESOPHAGOGASTRODUODENOSCOPY N/A 07/28/2014   Procedure: ESOPHAGOGASTRODUODENOSCOPY (EGD);  Surgeon: NajeRogene Houston;  [facility];  Service: Endoscopy;  Laterality: N/A;  200-rescheduled to 245 Aet[employer] Estatesified pt  . ESOPHAGOGASTRODUODENOSCOPY N/A 08/14/2016   Procedure: ESOPHAGOGASTRODUODENOSCOPY (EGD);  Surgeon: NajeRogene Houston;  [facility];  Service: Endoscopy;  Laterality: N/A;  100  . EYE SURGERY Bilateral  lasik surgery   . KNEE ARTHROSCOPY WITH MEDIAL MENISECTOMY Left 02/05/2018   Procedure: LEFT KNEE ARTHROSCOPY WITH PARTIAL MEDIAL MENISECTOMY;  Surgeon: Harrison, Stanley E, MD;  Location: [facility];  Service: Orthopedics;  Laterality: Left;  . KNEE SURGERY    . LATERAL EPICONDYLE RELEASE Left 01/21/2014   Procedure: Left Tennis Elbow Release with debridement  tendon repair with reattachment;  Surgeon: Stanley E Harrison, MD;  Location: [facility];  Service: Orthopedics;  Laterality: Left;  . penile implant    . PROSTATE SURGERY    . SHOULDER SURGERY    .  TONSILLECTOMY    . TOTAL KNEE ARTHROPLASTY Left 10/19/2018   Procedure: LEFT TOTAL KNEE ARTHROPLASTY;  Surgeon: Lucey, Steve, MD;  Location: [facility];  Service: Orthopedics;  Laterality: Left;  with abductor block failed spinal  . TRANSTHORACIC ECHOCARDIOGRAM  03/2010   EF=>55%; LV mildly dilated; LA mod dilated; mild MR & TR; normal RVSP; mild pulm valve regurg    There were no vitals filed for this visit.  Subjective Assessment - 01/21/19 1351    Subjective  Patient stated that he isnt't having any pain today. He reported he was able to go to the [employer] this morning.     Pertinent History  Left TKA 10/19/18    Limitations  Standing;Walking;House hold activities    How long can you stand comfortably?  Not limited    How long can you walk comfortably?  10 minutes    Patient Stated Goals  Get the knee moving better    Currently in Pain?  No/denies                       [facility] Adult PT Treatment/Exercise - 01/21/19 0001      Knee/Hip Exercises: Stretches   Passive Hamstring Stretch  Left;3 reps;30 seconds    Passive Hamstring Stretch Limitations  On 12 inch step    Knee: Self-Stretch to increase Flexion  Left;Other (comment);3 reps;30 seconds    Gastroc Stretch  Both;3 reps;30 seconds    Gastroc Stretch Limitations  On slant board      Knee/Hip Exercises: Machines for Strengthening   Cybex Knee Extension  2x10 20#     Cybex Leg Press  60# both legs, 2x10    Other Machine  Resisted ambulation 50# x 10 repetitions forward, 10 repetitions backward      Knee/Hip Exercises: Standing   Other Standing Knee Exercises  Pallof press with RTB with tandem stance x20 with each LE forward. Tandem ambulation on foam 15 feet x 3 roundtrips. Tandem stance each LE forward on foam with shoulder flexion with 3# bar weight x 20    Other Standing Knee Exercises  Sidestep with minisqaut with RTB 15 feet x 3 roundtrips      Knee/Hip Exercises: Seated   Other Seated Knee/Hip Exercises  Stool  scoots 2 x forward and back 15 feet             PT Education - 01/21/19 1352    Education Details  Discussed purpose and technique of interventions throughout session.     Person(s) Educated  Patient    Methods  Explanation    Comprehension  Verbalized understanding       PT Short Term Goals - 01/06/19 1323      PT SHORT TERM GOAL #1   Title  Patient will demonstrate understanding and report regular compliance with HEP to improve knee AROM, lower extremity strength, and overall functional   mobility.     Time  3    Period  Weeks    Status  Achieved      PT SHORT TERM GOAL #2   Title  Patient will demonstrate left knee extension/flexion active range of motion of at least 5-95 degrees to assist with more normalized gait pattern and stair ambulation.    Time  3    Period  Weeks    Status  Achieved      PT SHORT TERM GOAL #3   Title  Patient will demonstrate improvement of  MMT grade in all musculature tested as deficient at evaluation to assist with improvement in gait mechanics and stair ambulation.     Time  3    Period  Weeks    Status  Achieved      PT SHORT TERM GOAL #4   Title  Patient will perform single limb stance on left/right lower extremity for 3 seconds in order to assist with stair ambulation.    Time  3    Period  Weeks    Status  Achieved        PT Long Term Goals - 01/06/19 1323      PT LONG TERM GOAL #1   Title  Patient will improve ROM for left knee extension/flexion to 0-120 degrees to improve squatting, and other functional mobility.    Baseline  See objective measures    Time  3    Period  Weeks    Status  On-going    Target Date  01/27/19      PT LONG TERM GOAL #2   Title  Patient will demonstrate improvement of 1 MMT grade in all musculature tested as deficient at evaluation to assist with improvement in gait mechanics and stair ambulation.     Baseline  01/06/19: Patient improved in some, but not all muscle groups    Time  3    Period   Weeks    Status  Partially Met    Target Date  01/27/19      PT LONG TERM GOAL #3   Title  Patient will demonstrate ability to perform 10 repetitions on the 30 second chair rise indicating improved balance and functional strength.     Baseline  11/17/18: See objective measures    Time  6    Period  Weeks    Status  Achieved      PT LONG TERM GOAL #4   Title  Patient will report ability to ambulate for 10 minutes with least restrictive assistive device with no greater than 3/10 pain for improved ease of performing household activities.    Baseline  11/17/18: Patient reported that he felt he could do this.     Time  6    Period  Weeks    Status  Achieved      PT LONG TERM GOAL #5   Title  Patient will demonstrate ability to ambulate at a gait velocity of at least 0.8 m/s with LRAD on 2MWT indicating improved ability to ambulate safely household and limited community distances.     Baseline  11/17/18: See objective measures    Time  6    Period  Weeks    Status  Achieved      Additional Long Term Goals   Additional Long Term Goals  Yes      PT LONG TERM GOAL #6   Title  Patient will demonstrate ability to maintain single limb stance on   each lower extremity for at least 10 seconds for improved balance and safety with stair negotiation.     Baseline  See objective measures    Time  3    Period  Weeks    Status  On-going    Target Date  01/27/19      PT LONG TERM GOAL #7   Title  Patient will demonstrate ability to perform double leg squat with proper mechanics and equal weight distribution on each lower extremity.    Baseline  01/06/19: Patient demonstrated some weight shift onto the right lower extremity with squatting.     Time  3    Period  Weeks    Status  New    Target Date  01/27/19            Plan - 01/21/19 1425    Clinical Impression Statement  This session continued with established plan of care. Added Pallof press with tandem stance. Patient has continued to  demonstrate good progress in therapy without complaints of pain throughout. Plan to re-assess patient at next session with likely discharge.     Rehab Potential  Good    Clinical Impairments Affecting Rehab Potential  Positive: Motivated; Negative: acute    PT Frequency  2x / week    PT Duration  3 weeks    PT Treatment/Interventions  ADLs/Self Care Home Management;Aquatic Therapy;Cryotherapy;Electrical Stimulation;Moist Heat;DME Instruction;Gait training;Stair training;Functional mobility training;Therapeutic activities;Therapeutic exercise;Balance training;Neuromuscular re-education;Patient/family education;Orthotic Fit/Training;Manual techniques;Scar mobilization;Passive range of motion;Dry needling;Energy conservation;Taping    PT Next Visit Plan  Re-assess    PT Home Exercise Plan  10/22/18: Quad sets, heel slides, SAQ, 2x10 1x/day; ankle pumps 2 x 30 1x/day; 11/03/18: LAQ 2 x 10 5'' holds; 11/10/18: Heel/toe raises x10, hip abduction x 10 2x/day; 11/24/18: Hamstring stretch 3 x 30 seconds; 12/20: prone quad stretch, supine heel prop, SAQ, SLR, SLS, TKE BTB    Consulted and Agree with Plan of Care  Patient       Patient will benefit from skilled therapeutic intervention in order to improve the following deficits and impairments:  Abnormal gait, Decreased balance, Decreased endurance, Decreased mobility, Difficulty walking, Hypomobility, Decreased range of motion, Decreased scar mobility, Increased edema, Decreased activity tolerance, Decreased strength, Impaired flexibility, Pain  Visit Diagnosis: Acute pain of left knee  Stiffness of left knee, not elsewhere classified  Muscle weakness (generalized)  Other abnormalities of gait and mobility     Problem List Patient Active Problem List   Diagnosis Date Noted  . 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 S/P left knee arthroscopy 02/05/18 02/12/2018  . Ischemic cardiomyopathy 11/18/2017  . Acute pain of left knee 01/24/2017  . NSTEMI  (non-ST elevated myocardial infarction) ([place]) 01/08/2017  . ST elevation myocardial infarction (STEMI) of inferior wall, subsequent episode of care ([place]) 01/08/2017  . Mixed hyperlipidemia 01/08/2017  . Sinus bradycardia 01/08/2017  . Left tennis elbow 11/08/2013  . Stable angina ([place]) 11/08/2013  . CAD in native artery 11/08/2013  . PTSD (post-traumatic stress disorder) 11/08/2013  . Essential hypertension 11/08/2013  . Dyslipidemia 11/08/2013  . Fracture of phalanx of thumb 12/03/2011   Clarene Critchley PT, DPT 2:27 PM, 01/21/19 [place], [place], [address] Phone: [phone]   Fax:  [phone]  Name: Cory [name] MRN: [id number] Date of Birth: 30-Aug-1946

## 2019-01-22 ENCOUNTER — Other Ambulatory Visit: Payer: Self-pay | Admitting: Internal Medicine

## 2019-01-22 DIAGNOSIS — I10 Essential (primary) hypertension: Secondary | ICD-10-CM

## 2019-01-22 MED ORDER — IRBESARTAN 300 MG PO TABS: 300.0000 mg | ORAL_TABLET | Freq: Every day | ORAL | 0 refills | Status: DC

## 2019-01-22 NOTE — Telephone Encounter (Signed)
*  STAT* If patient is at the pharmacy, call can be transferred to refill team.   1. Which medications need to be refilled? (please list name of each medication and dose if known) irbesartan 300 mg tab  2. Which pharmacy/location (including street and city if local pharmacy) is medication to be sent to?[facility] [place]  3. Do they need a 30 day or 90 day supply? [place]

## 2019-01-26 ENCOUNTER — Encounter (HOSPITAL_COMMUNITY): Payer: Self-pay | Admitting: Physical Therapy

## 2019-01-26 ENCOUNTER — Ambulatory Visit (HOSPITAL_COMMUNITY): Payer: No Typology Code available for payment source | Admitting: Physical Therapy

## 2019-01-26 DIAGNOSIS — R2689 Other abnormalities of gait and mobility: Secondary | ICD-10-CM

## 2019-01-26 DIAGNOSIS — M25562 Pain in left knee: Secondary | ICD-10-CM

## 2019-01-26 DIAGNOSIS — M25662 Stiffness of left knee, not elsewhere classified: Secondary | ICD-10-CM

## 2019-01-26 DIAGNOSIS — M6281 Muscle weakness (generalized): Secondary | ICD-10-CM

## 2019-01-26 NOTE — Patient Instructions (Addendum)
(  Home) Squat: (Assist)    Using supports, arms close to body, squat by dropping hips back as if sitting in a chair. Repeat _15___ times per set. Do __1-2__ sets per session. Do __7__ sessions per week.  Copyright  VHI. All rights reserved.

## 2019-01-26 NOTE — Therapy (Signed)
[facility] [address] [place], [place], [address] Phone: [phone]   Fax:  [phone]  Physical Therapy Treatment / Progress Note / Discharge Summary  Patient Details  Name: Cory [name] MRN: [id number] Date of Birth: 08-18-1946 Referring Provider (PT): Irving Shows, MD   Encounter Date: 01/26/2019   Progress Note Reporting Period 01/06/19 to 01/26/19  See note below for Objective Data and Assessment of Progress/Goals.       PT End of Session - 01/26/19 0950    Visit Number  23    Number of Visits  24    Date for PT Re-Evaluation  01/29/19   Corrected for mistype   Authorization Type  Primary: VA based on medical necessity limited to 4 modalities per visit; Secondary[place]land [employer] Medicare HMO    Authorization Time Period  10/22/18 - 12/03/18; New: 01/06/19 - 01/29/19    Authorization - Visit Number  8    Authorization - Number of Visits  15    PT Start Time  0946    PT Stop Time  1010    PT Time Calculation (min)  24 min    Activity Tolerance  Patient tolerated treatment well    Behavior During Therapy  WFL for tasks assessed/performed       Past Medical History:  Diagnosis Date  . Anemia   . Arthritis   . Coronary artery disease    a. 09/2001 PCI/BMS to D1: 2.5 x 15 Express 2 BMS;  b. 2011 Cath: stable anatomy; c. 03/2014 Low risk MV, EF 54%;  d. 09/2016 Inf STEMI/PCI [facility]): LM nl, LAD 41m D1 nl w/ 90% in 153minf branch, LCX nl, RCA 99 (Synergy DES x 4 - 3.5x28 prox, 3.5x3868m.5x16d, 2.25x16 to RPDA), EF 55%.  . GMarland KitchenRD (gastroesophageal reflux disease)   . Hyperlipidemia   . Hypertensive heart disease   . [place]ocardial infarction ([place]  . Pneumonia   . PTSD (post-traumatic stress disorder)     Past Surgical History:  Procedure Laterality Date  . CARDIAC CATHETERIZATION  05/17/2010   left main free of significant lesion; LAD with 60% lesion in mid segment & 60-70% lesion in mid-distal segment with  subtotal occlusion at apex & small side branch of diagonal 1 that is subtotally occluded; ramus intermedius with 90% ostial stenosis; L Cfx w/mild luminal irreg; RCA is dominant vessel with diffuse disease, 50% prox lesion & 60% lesion in mid segment & 60% lesion in PDA  . CATARACT EXTRACTION W/PHACO Right 06/07/2015   Procedure: CATARACT EXTRACTION PHACO AND INTRAOCULAR LENS PLACEMENT (IOC);  Surgeon: RoyMarylynn PearsonD;  [facility]  Laterality: Right;  . COLONOSCOPY  02/25/2012   Procedure: COLONOSCOPY;  Surgeon: MarJamesetta SoD;  [facility];  Service: Gastroenterology;  Laterality: N/A;  . CORONARY ANGIOPLASTY WITH STENT PLACEMENT  09/2001   Taxus (2.5x15m63mtent to 1st diagonal   . ESOPHAGEAL DILATION N/A 08/14/2016   Procedure: ESOPHAGEAL DILATION;  Surgeon: NajeRogene Houston;  [facility];  Service: Endoscopy;  Laterality: N/A;  . ESOPHAGOGASTRODUODENOSCOPY  02/25/2012   Procedure: ESOPHAGOGASTRODUODENOSCOPY (EGD);  Surgeon: MarkJamesetta So;  [facility];  Service: Gastroenterology;  Laterality: N/A;  . ESOPHAGOGASTRODUODENOSCOPY N/A 07/28/2014   Procedure: ESOPHAGOGASTRODUODENOSCOPY (EGD);  Surgeon: NajeRogene Houston;  [facility];  Service: Endoscopy;  Laterality: N/A;  200-rescheduled to 245 [place] pt  . ESOPHAGOGASTRODUODENOSCOPY N/A  08/14/2016   Procedure: ESOPHAGOGASTRODUODENOSCOPY (EGD);  Surgeon: Rogene Houston, MD;  [facility];  Service: Endoscopy;  Laterality: N/A;  100  . EYE SURGERY Bilateral    lasik surgery   . KNEE ARTHROSCOPY WITH MEDIAL MENISECTOMY Left 02/05/2018   Procedure: LEFT KNEE ARTHROSCOPY WITH PARTIAL MEDIAL MENISECTOMY;  Surgeon: Carole Civil, MD;  Location: [facility];  Service: Orthopedics;  Laterality: Left;  . KNEE SURGERY    . LATERAL EPICONDYLE RELEASE Left 01/21/2014   Procedure: Left Tennis Elbow Release with debridement  tendon repair with reattachment;   Surgeon: Carole Civil, MD;  Location: [facility];  Service: Orthopedics;  Laterality: Left;  . penile implant    . PROSTATE SURGERY    . SHOULDER SURGERY    . TONSILLECTOMY    . TOTAL KNEE ARTHROPLASTY Left 10/19/2018   Procedure: LEFT TOTAL KNEE ARTHROPLASTY;  Surgeon: Vickey Huger, MD;  Location: [facility];  Service: Orthopedics;  Laterality: Left;  with abductor block failed spinal  . TRANSTHORACIC ECHOCARDIOGRAM  03/2010   EF=>55%; LV mildly dilated; LA mod dilated; mild MR & TR; normal RVSP; mild pulm valve regurg    There were no vitals filed for this visit.  Subjective Assessment - 01/26/19 0949    Subjective  Patient stated he is ready with physical therapy. He stated his knee feels good.     Pertinent History  Left TKA 10/19/18    Limitations  Standing;Walking;House hold activities    How long can you stand comfortably?  Not limited    How long can you walk comfortably?  10 minutes    Patient Stated Goals  Get the knee moving better    Currently in Pain?  No/denies         [facility] PT Assessment - 01/26/19 0001      Assessment   Medical Diagnosis  S/P Left TKA    Referring Provider (PT)  Irving Shows, MD      Prior Function   Level of Independence  Independent;Independent with basic ADLs      Cognition   Overall Cognitive Status  Within Functional Limits for tasks assessed      Observation/Other Assessments   Observations  Patient performed double leg squat with equal weightbearing and good depth    Focus on Therapeutic Outcomes (FOTO)   39% limited   was 42% limited     Circumferential Edema   Circumferential - Right  14.5 inches at joint line    Circumferential - Left   15 inches at joint line      AROM   Left Knee Extension  3   was 5   Left Knee Flexion  125   was 125     Strength   Right Hip Flexion  5/5   was 4+   Right Hip Extension  4+/5   was 4   Right Hip ABduction  5/5   was 4+   Left Hip Flexion  5/5   was 5   Left Hip Extension  4+/5    was 4   Left Hip ABduction  5/5   was 4+   Right Knee Flexion  5/5    Right Knee Extension  5/5    Left Knee Flexion  5/5   was 4+   Left Knee Extension  5/5   was 4+   Right Ankle Dorsiflexion  5/5    Left Ankle Dorsiflexion  5/5      Ambulation/Gait   Ambulation/Gait  Yes    Ambulation/Gait Assistance  5: Supervision    Ambulation Distance (Feet)  470 Feet   2MWT   Assistive device  None    Gait Pattern  Within Functional Limits    Ambulation Surface  Level;Indoor    Gait velocity  1.19 m/s      Static Standing Balance   Static Standing - Balance Support  No upper extremity supported    Static Standing Balance -  Activities   Single Leg Stance - Right Leg;Single Leg Stance - Left Leg    Static Standing - Comment/# of Minutes  12.56 seconds Right; 11.97 seconds left                             PT Short Term Goals - 01/26/19 1029      PT SHORT TERM GOAL #1   Title  Patient will demonstrate understanding and report regular compliance with HEP to improve knee AROM, lower extremity strength, and overall functional mobility.     Time  3    Period  Weeks    Status  Achieved      PT SHORT TERM GOAL #2   Title  Patient will demonstrate left knee extension/flexion active range of motion of at least 5-95 degrees to assist with more normalized gait pattern and stair ambulation.    Time  3    Period  Weeks    Status  Achieved      PT SHORT TERM GOAL #3   Title  Patient will demonstrate improvement of  MMT grade in all musculature tested as deficient at evaluation to assist with improvement in gait mechanics and stair ambulation.     Time  3    Period  Weeks    Status  Achieved      PT SHORT TERM GOAL #4   Title  Patient will perform single limb stance on left/right lower extremity for 3 seconds in order to assist with stair ambulation.    Time  3    Period  Weeks    Status  Achieved        PT Long Term Goals - 01/26/19 1029      PT LONG TERM  GOAL #1   Title  Patient will improve ROM for left knee extension/flexion to 0-120 degrees to improve squatting, and other functional mobility.    Baseline  See objective measures    Time  3    Period  Weeks    Status  Partially Met      PT LONG TERM GOAL #2   Title  Patient will demonstrate improvement of 1 MMT grade in all musculature tested as deficient at evaluation to assist with improvement in gait mechanics and stair ambulation.     Baseline  See objective measures    Time  3    Period  Weeks    Status  Achieved      PT LONG TERM GOAL #3   Title  Patient will demonstrate ability to perform 10 repetitions on the 30 second chair rise indicating improved balance and functional strength.     Baseline  11/17/18: See objective measures    Time  6    Period  Weeks    Status  Achieved      PT LONG TERM GOAL #4   Title  Patient will report ability to ambulate for 10 minutes with least restrictive assistive device with no greater  than 3/10 pain for improved ease of performing household activities.    Baseline  11/17/18: Patient reported that he felt he could do this.     Time  6    Period  Weeks    Status  Achieved      PT LONG TERM GOAL #5   Title  Patient will demonstrate ability to ambulate at a gait velocity of at least 0.8 m/s with LRAD on 2MWT indicating improved ability to ambulate safely household and limited community distances.     Baseline  11/17/18: See objective measures    Time  6    Period  Weeks    Status  Achieved      PT LONG TERM GOAL #6   Title  Patient will demonstrate ability to maintain single limb stance on each lower extremity for at least 10 seconds for improved balance and safety with stair negotiation.     Baseline  See objective measures    Time  3    Period  Weeks    Status  Achieved      PT LONG TERM GOAL #7   Title  Patient will demonstrate ability to perform double leg squat with proper mechanics and equal weight distribution on each lower  extremity.    Baseline  01/26/19: Patient performed with good depth and equal weight bearing on each lower extremity    Time  3    Period  Weeks    Status  Achieved            Plan - 01/26/19 1036    Clinical Impression Statement  This session performed a re-assessment of patient's progress towards goals. Patient achieved 4 out of 4 short term goals. Patient achieved 6 out of 7 long term goals. The only goal the patient did not fully achieve was a long term ROM goal as patient is still minimally limited with knee extension ROM, however patient's ROM is within functional limits. Patient was educated on continuing exercises with a focus on strengthening. Discussed examination findings with patient. Patient is being discharged at this time as he has achieved the majority of his goals and as patient feels ready to continue exercises independently.     Rehab Potential  Good    Clinical Impairments Affecting Rehab Potential  Positive: Motivated; Negative: acute    PT Frequency  2x / week    PT Duration  3 weeks    PT Treatment/Interventions  ADLs/Self Care Home Management;Aquatic Therapy;Cryotherapy;Electrical Stimulation;Moist Heat;DME Instruction;Gait training;Stair training;Functional mobility training;Therapeutic activities;Therapeutic exercise;Balance training;Neuromuscular re-education;Patient/family education;Orthotic Fit/Training;Manual techniques;Scar mobilization;Passive range of motion;Dry needling;Energy conservation;Taping    PT Next Visit Plan  Discharged    PT Home Exercise Plan  10/22/18: Quad sets, heel slides, SAQ, 2x10 1x/day; ankle pumps 2 x 30 1x/day; 11/03/18: LAQ 2 x 10 5'' holds; 11/10/18: Heel/toe raises x10, hip abduction x 10 2x/day; 11/24/18: Hamstring stretch 3 x 30 seconds; 12/20: prone quad stretch, supine heel prop, SAQ, SLR, SLS, TKE BTB; 01/26/19: Squats 2x15 1x/day    Consulted and Agree with Plan of Care  Patient       Patient will benefit from skilled therapeutic  intervention in order to improve the following deficits and impairments:  Abnormal gait, Decreased balance, Decreased endurance, Decreased mobility, Difficulty walking, Hypomobility, Decreased range of motion, Decreased scar mobility, Increased edema, Decreased activity tolerance, Decreased strength, Impaired flexibility, Pain  Visit Diagnosis: Acute pain of left knee  Stiffness of left knee, not elsewhere classified  Muscle weakness (  generalized)  Other abnormalities of gait and mobility     Problem List Patient Active Problem List   Diagnosis Date Noted  . 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 S/P left knee arthroscopy 02/05/18 02/12/2018  . Ischemic cardiomyopathy 11/18/2017  . Acute pain of left knee 01/24/2017  . NSTEMI (non-ST elevated myocardial infarction) ([place]) 01/08/2017  . ST elevation myocardial infarction (STEMI) of inferior wall, subsequent episode of care ([place]) 01/08/2017  . Mixed hyperlipidemia 01/08/2017  . Sinus bradycardia 01/08/2017  . Left tennis elbow 11/08/2013  . Stable angina ([place]) 11/08/2013  . CAD in native artery 11/08/2013  . PTSD (post-traumatic stress disorder) 11/08/2013  . Essential hypertension 11/08/2013  . Dyslipidemia 11/08/2013  . Fracture of phalanx of thumb 12/03/2011   PHYSICAL THERAPY DISCHARGE SUMMARY  Visits from Start of Care: 23  Current functional level related to goals / functional outcomes: See above   Remaining deficits: See above   Education / Equipment: HEP, see above Plan: Patient agrees to discharge.  Patient goals were partially met. Patient is being discharged due to                                                     ?????                Patient is being discharged due to meeting the majority of his goals and being pleased with his current functional status.   Clarene Critchley PT, DPT 10:40 AM, 01/26/19 [place], [place], [address] Phone: [phone]   Fax:  [phone]  Name: Cory [name] MRN: [id number] Date of Birth: December 31, 1945

## 2019-02-04 ENCOUNTER — Ambulatory Visit: Payer: Medicare HMO | Admitting: Internal Medicine

## 2019-05-17 ENCOUNTER — Other Ambulatory Visit: Payer: Self-pay | Admitting: Internal Medicine

## 2019-05-17 DIAGNOSIS — I10 Essential (primary) hypertension: Secondary | ICD-10-CM

## 2019-05-17 NOTE — Telephone Encounter (Signed)
°*  STAT* If patient is at the pharmacy, call can be transferred to refill team.   1. Which medications need to be refilled? (please list name of each medication and dose if known)   irbesartan (AVAPRO) 300 MG tablet    2. Which pharmacy/location (including street and city if local pharmacy) is medication to be sent to?[id number] - [place], [place] - [address]  3. Do they need a 30 day or 90 day supply? 90 days  

## 2019-05-18 ENCOUNTER — Telehealth: Payer: Self-pay | Admitting: Internal Medicine

## 2019-05-18 MED ORDER — IRBESARTAN 300 MG PO TABS: 300.0000 mg | ORAL_TABLET | Freq: Every day | ORAL | 0 refills | Status: DC

## 2019-05-18 NOTE — Telephone Encounter (Signed)
LM for patient to call back to discuss changing appt type to telemedicine - phone or video on 05/21/2019

## 2019-05-18 NOTE — Telephone Encounter (Signed)
Irbesartan refilled.

## 2019-05-19 DIAGNOSIS — D485 Neoplasm of uncertain behavior of skin: Secondary | ICD-10-CM | POA: Diagnosis not present

## 2019-05-19 DIAGNOSIS — L821 Other seborrheic keratosis: Secondary | ICD-10-CM | POA: Diagnosis not present

## 2019-05-19 DIAGNOSIS — L819 Disorder of pigmentation, unspecified: Secondary | ICD-10-CM | POA: Diagnosis not present

## 2019-05-19 DIAGNOSIS — L570 Actinic keratosis: Secondary | ICD-10-CM | POA: Diagnosis not present

## 2019-05-19 DIAGNOSIS — D2239 Melanocytic nevi of other parts of face: Secondary | ICD-10-CM | POA: Diagnosis not present

## 2019-05-20 ENCOUNTER — Telehealth: Payer: Self-pay

## 2019-05-20 ENCOUNTER — Telehealth: Payer: Self-pay | Admitting: Internal Medicine

## 2019-05-20 NOTE — Telephone Encounter (Signed)
Smartphone/video pre-reg complete, MyChart code sent, verbal consent. 05/20/2019 MS

## 2019-05-20 NOTE — Telephone Encounter (Signed)
Virtual Visit Pre-Appointment Phone Call  "(Name), I am calling you today to discuss your upcoming appointment. We are currently trying to limit exposure to the virus that causes COVID-19 by seeing patients at home rather than in the office."  1. "What is the BEST phone number to call the day of the visit?" - include this in appointment notes  2. "Do you have or have access to (through a family member/friend) a smartphone with video capability that we can use for your visit?" a. If yes - list this number in appt notes as "cell" (if different from BEST phone #) and list the appointment type as a VIDEO visit in appointment notes b. If no - list the appointment type as a PHONE visit in appointment notes  3. Confirm consent - "In the setting of the current Covid19 crisis, you are scheduled for a (phone or video) visit with your provider on (date) at (time).  Just as we do with many in-office visits, in order for you to participate in this visit, we must obtain consent.  If you'd like, I can send this to your mychart (if signed up) or email for you to review.  Otherwise, I can obtain your verbal consent now.  All virtual visits are billed to your insurance company just like a normal visit would be.  By agreeing to a virtual visit, we'd like you to understand that the technology does not allow for your provider to perform an examination, and thus may limit your provider's ability to fully assess your condition. If your provider identifies any concerns that need to be evaluated in person, we will make arrangements to do so.  Finally, though the technology is pretty good, we cannot assure that it will always work on either your or our end, and in the setting of a video visit, we may have to convert it to a phone-only visit.  In either situation, we cannot ensure that we have a secure connection.  Are you willing to proceed?" STAFF: Did the patient verbally acknowledge consent to telehealth visit? Document  YES/NO here: consent obtained by Davonna Belling yesterday, 05/19/19  4. Advise patient to be prepared - "Two hours prior to your appointment, go ahead and check your blood pressure, pulse, oxygen saturation, and your weight (if you have the equipment to check those) and write them all down. When your visit starts, your provider will ask you for this information. If you have an Apple Watch or Kardia device, please plan to have heart rate information ready on the day of your appointment. Please have a pen and paper handy nearby the day of the visit as well."  5. Give patient instructions for MyChart download to smartphone OR Doximity/Doxy.me as below if video visit (depending on what platform provider is using)  6. Inform patient they will receive a phone call 15 minutes prior to their appointment time (may be from unknown caller ID) so they should be prepared to answer    TELEPHONE CALL NOTE  Abdulaziz R Albanese has been deemed a candidate for a follow-up tele-health visit to limit community exposure during the Covid-19 pandemic. I spoke with the patient via phone to ensure availability of phone/video source, confirm preferred email & phone number, and discuss instructions and expectations.  I reminded AVONTE DIELEMAN to be prepared with any vital sign and/or heart rhythm information that could potentially be obtained via home monitoring, at the time of his visit. I reminded DAYVION SLUIS to  expect a phone call prior to his visit.  Donivan Scull, [place] 05/20/2019 3:53 PM   INSTRUCTIONS FOR DOWNLOADING THE MYCHART APP TO SMARTPHONE  - The patient must first make sure to have activated MyChart and know their login information - If Apple, go to [employer] and type in MyChart in the search bar and download the app. If Android, ask patient to go to [employer] and type in [place] in the search bar and download the app. The app is free but as with any other app downloads, their phone may  require them to verify saved payment information or Apple/Android password.  - The patient will need to then log into the app with their MyChart username and password, and select [facility] as their healthcare provider to link the account. When it is time for your visit, go to the MyChart app, find appointments, and click Begin Video Visit. Be sure to Select Allow for your device to access the Microphone and Camera for your visit. You will then be connected, and your provider will be with you shortly.  **If they have any issues connecting, or need assistance please contact MyChart service desk [phone])**  **If using a computer, in order to ensure the best quality for their visit they will need to use either of the following Internet Browsers: [employer], or Google Chrome**  IF USING DOXIMITY or DOXY.ME - The patient will receive a link just prior to their visit by text.     FULL LENGTH CONSENT FOR TELE-HEALTH VISIT   I hereby voluntarily request, consent and authorize [facility] and its employed or contracted physicians, physician assistants, nurse practitioners or other licensed health care professionals (the Practitioner), to provide me with telemedicine health care services (the "Services") as deemed necessary by the treating Practitioner. I acknowledge and consent to receive the Services by the Practitioner via telemedicine. I understand that the telemedicine visit will involve communicating with the Practitioner through live audiovisual communication technology and the disclosure of certain medical information by electronic transmission. I acknowledge that I have been given the opportunity to request an in-person assessment or other available alternative prior to the telemedicine visit and am voluntarily participating in the telemedicine visit.  I understand that I have the right to withhold or withdraw my consent to the use of telemedicine in the course of my care at  any time, without affecting my right to future care or treatment, and that the Practitioner or I may terminate the telemedicine visit at any time. I understand that I have the right to inspect all information obtained and/or recorded in the course of the telemedicine visit and may receive copies of available information for a reasonable fee.  I understand that some of the potential risks of receiving the Services via telemedicine include:  Marland Kitchen Delay or interruption in medical evaluation due to technological equipment failure or disruption; . Information transmitted may not be sufficient (e.g. poor resolution of images) to allow for appropriate medical decision making by the Practitioner; and/or  . In rare instances, security protocols could fail, causing a breach of personal health information.  Furthermore, I acknowledge that it is my responsibility to provide information about my medical history, conditions and care that is complete and accurate to the best of my ability. I acknowledge that Practitioner's advice, recommendations, and/or decision may be based on factors not within their control, such as incomplete or inaccurate data provided by me or distortions of diagnostic images or specimens that  may result from electronic transmissions. I understand that the practice of medicine is not an exact science and that Practitioner makes no warranties or guarantees regarding treatment outcomes. I acknowledge that I will receive a copy of this consent concurrently upon execution via email to the email address I last provided but may also request a printed copy by calling the office of [facility].    I understand that my insurance will be billed for this visit.   I have read or had this consent read to me. . I understand the contents of this consent, which adequately explains the benefits and risks of the Services being provided via telemedicine.  . I have been provided ample opportunity to ask questions  regarding this consent and the Services and have had my questions answered to my satisfaction. . I give my informed consent for the services to be provided through the use of telemedicine in my medical care  By participating in this telemedicine visit I agree to the above.

## 2019-05-21 ENCOUNTER — Telehealth (INDEPENDENT_AMBULATORY_CARE_PROVIDER_SITE_OTHER): Payer: Medicare HMO | Admitting: Internal Medicine

## 2019-05-21 VITALS — BP 137/82 | HR 67 | Ht 69.0 in | Wt 168.8 lb

## 2019-05-21 DIAGNOSIS — E785 Hyperlipidemia, unspecified: Secondary | ICD-10-CM | POA: Diagnosis not present

## 2019-05-21 DIAGNOSIS — E782 Mixed hyperlipidemia: Secondary | ICD-10-CM

## 2019-05-21 DIAGNOSIS — Z7189 Other specified counseling: Secondary | ICD-10-CM

## 2019-05-21 DIAGNOSIS — I255 Ischemic cardiomyopathy: Secondary | ICD-10-CM | POA: Diagnosis not present

## 2019-05-21 DIAGNOSIS — I10 Essential (primary) hypertension: Secondary | ICD-10-CM

## 2019-05-21 DIAGNOSIS — I2510 Atherosclerotic heart disease of native coronary artery without angina pectoris: Secondary | ICD-10-CM

## 2019-05-21 MED ORDER — ATORVASTATIN CALCIUM 80 MG PO TABS: 80.0000 mg | ORAL_TABLET | Freq: Every day | ORAL | 3 refills | Status: DC

## 2019-05-21 NOTE — Progress Notes (Signed)
Virtual Visit via Video Note   This visit type was conducted due to national recommendations for restrictions regarding the COVID-19 Pandemic (e.g. social distancing) in an effort to limit this patient's exposure and mitigate transmission in our community.  Due to his co-morbid illnesses, this patient is at least at moderate risk for complications without adequate follow up.  This format is felt to be most appropriate for this patient at this time.  All issues noted in this document were discussed and addressed.  A limited physical exam was performed with this format.  Please refer to the patient's chart for his consent to telehealth for [facility].   Evaluation Performed:  doximity video visit  Date:  05/21/2019   ID:  Cory [name], Cory [name] October 19, 1946, MRN [id number]  Patient Location:  [address] [place] [address]  Provider location:   [address] [place] [address], [place] [address]  PCP:  Sharilyn Sites, MD  Cardiologist:  [name], MD Electrophysiologist:  None   Chief Complaint:  No complaints  History of Present Illness:    Cory [name] is a 72 y.o. male who presents via audio/video conferencing for a telehealth visit today.  Cory [name] was seen today in video visit follow-up.  Over the past year he has had no chest pain or worsening shortness of breath.  His last echo did show some improvement in LV function but was still reduced at 40 to 45%.  He did have an inferior wall motion abnormality consistent with inferior STEMI.  I started him on low-dose carvedilol 3.125 mg twice daily.  He is tolerating this well.  The only other change was that his PCP had decreased his atorvastatin from 80 to 40 mg due to an LDL of 50, but a subsequent LDL 3 months later did increase up to 70.  I am not sure the rationale of this because he had no side effects of that medication and high potency statin therapy with a lower LDL is preferable.  The patient does not have  symptoms concerning for COVID-19 infection (fever, chills, cough, or new SHORTNESS OF BREATH).    Prior CV studies:   The following studies were reviewed today:  Chart review  PMHx:  Past Medical History:  Diagnosis Date  . Anemia   . Arthritis   . Coronary artery disease    a. 09/2001 PCI/BMS to D1: 2.5 x 15 Express 2 BMS;  b. 2011 Cath: stable anatomy; c. 03/2014 Low risk MV, EF 54%;  d. 09/2016 Inf STEMI/PCI [facility]): LM nl, LAD 76m D1 nl w/ 90% in 167minf branch, LCX nl, RCA 99 (Synergy DES x 4 - 3.5x28 prox, 3.5x3872m.5x16d, 2.25x16 to RPDA), EF 55%.  . GMarland KitchenRD (gastroesophageal reflux disease)   . Hyperlipidemia   . Hypertensive heart disease   . Myocardial infarction (HCCRicardo017  . Pneumonia   . PTSD (post-traumatic stress disorder)     Past Surgical History:  Procedure Laterality Date  . CARDIAC CATHETERIZATION  05/17/2010   left main free of significant lesion; LAD with 60% lesion in mid segment & 60-70% lesion in mid-distal segment with subtotal occlusion at apex & small side branch of diagonal 1 that is subtotally occluded; ramus intermedius with 90% ostial stenosis; L Cfx w/mild luminal irreg; RCA is dominant vessel with diffuse disease, 50% prox lesion & 60% lesion in mid segment & 60% lesion in PDA  . CATARACT EXTRACTION W/PHACO Right 06/07/2015   Procedure:  CATARACT EXTRACTION PHACO AND INTRAOCULAR LENS PLACEMENT (IOC);  Surgeon: Marylynn Pearson, MD;  Location: [place];  Service: Ophthalmology;  Laterality: Right;  . COLONOSCOPY  02/25/2012   Procedure: COLONOSCOPY;  Surgeon: Jamesetta So, MD;  [facility];  Service: Gastroenterology;  Laterality: N/A;  . CORONARY ANGIOPLASTY WITH STENT PLACEMENT  09/2001   Taxus (2.5x53m) stent to 1st diagonal   . ESOPHAGEAL DILATION N/A 08/14/2016   Procedure: ESOPHAGEAL DILATION;  Surgeon: NRogene Houston MD;  [facility];  Service: Endoscopy;  Laterality: N/A;  . ESOPHAGOGASTRODUODENOSCOPY   02/25/2012   Procedure: ESOPHAGOGASTRODUODENOSCOPY (EGD);  Surgeon: MJamesetta So MD;  [facility];  Service: Gastroenterology;  Laterality: N/A;  . ESOPHAGOGASTRODUODENOSCOPY N/A 07/28/2014   Procedure: ESOPHAGOGASTRODUODENOSCOPY (EGD);  Surgeon: NRogene Houston MD;  [facility];  Service: Endoscopy;  Laterality: N/A;  200-rescheduled to [place]  . ESOPHAGOGASTRODUODENOSCOPY N/A 08/14/2016   Procedure: ESOPHAGOGASTRODUODENOSCOPY (EGD);  Surgeon: NRogene Houston MD;  [facility];  Service: Endoscopy;  Laterality: N/A;  100  . EYE SURGERY Bilateral    lasik surgery   . KNEE ARTHROSCOPY WITH MEDIAL MENISECTOMY Left 02/05/2018   Procedure: LEFT KNEE ARTHROSCOPY WITH PARTIAL MEDIAL MENISECTOMY;  Surgeon: HCarole Civil MD;  Location[facility];  Service: Orthopedics;  Laterality: Left;  . KNEE SURGERY    . LATERAL EPICONDYLE RELEASE Left 01/21/2014   Procedure: Left Tennis Elbow Release with debridement  tendon repair with reattachment;  Surgeon: SCarole Civil MD;  Location: [facility];  Service: Orthopedics;  Laterality: Left;  . penile implant    . PROSTATE SURGERY    . SHOULDER SURGERY    . TONSILLECTOMY    . TOTAL KNEE ARTHROPLASTY Left 10/19/2018   Procedure: LEFT TOTAL KNEE ARTHROPLASTY;  Surgeon: LVickey Huger MD;  [facility];  Service: Orthopedics;  Laterality: Left;  with abductor block failed spinal  . TRANSTHORACIC ECHOCARDIOGRAM  03/2010   EF=>55%; LV mildly dilated; LA mod dilated; mild MR & TR; normal RVSP; mild pulm valve regurg    FAMHx:  Family History  Problem Relation Age of Onset  . Colitis Maternal Aunt     SOCHx:   reports that he has never smoked. He has never used smokeless tobacco. He reports that he does not drink alcohol or use drugs.  ALLERGIES:  Allergies  Allergen Reactions  . Ace Inhibitors Other (See Comments)    unknown  . Benadryl [Diphenhydramine Hcl] Other (See Comments)    Opposite reaction  of medication purpose - hyper  . Vioxx [Rofecoxib] Other (See Comments)    headache    MEDS:  Current Meds  Medication Sig  . acetaminophen (TYLENOL) 325 MG tablet Take 650 mg by mouth daily as needed for moderate pain or headache.  .Marland Kitchenaspirin EC 325 MG EC tablet Take 1 tablet (325 mg total) by mouth 2 (two) times daily.  .Marland Kitchenatorvastatin (LIPITOR) 40 MG tablet Take 40 mg by mouth daily at 6 PM.  . buPROPion (WELLBUTRIN SR) 100 MG 12 hr tablet Take 100 mg by mouth daily.  . fluticasone (FLONASE) 50 MCG/ACT nasal spray Place 1 spray into both nostrils daily as needed for allergies or rhinitis.  .Marland Kitchenirbesartan (AVAPRO) 300 MG tablet Take 1 tablet (300 mg total) by mouth daily. OV NEEDED  . loratadine (CLARITIN) 10 MG tablet Take 10 mg by mouth daily.   . Magnesium Malate 1250 (141.7 Mg) MG TABS Take 1,250  mg by mouth every evening.   . methocarbamol (ROBAXIN) 500 MG tablet Take 1-2 tablets (500-1,000 mg total) by mouth every 6 (six) hours as needed for muscle spasms.  Marland Kitchen oxyCODONE (OXY IR/ROXICODONE) 5 MG immediate release tablet Take 1-2 tablets (5-10 mg total) by mouth every 6 (six) hours as needed for moderate pain (pain score 4-6).  . pantoprazole (PROTONIX) 40 MG tablet Take 1 tablet (40 mg total) by mouth 2 (two) times daily before a meal.  . traZODone (DESYREL) 100 MG tablet Take 100 mg by mouth at bedtime.   Marland Kitchen venlafaxine (EFFEXOR) 75 MG tablet Take 75 mg by mouth daily.     ROS: Pertinent items noted in HPI and remainder of comprehensive ROS otherwise negative.  Labs/Other Tests and Data Reviewed:    Recent Labs: 10/09/2018: ALT 22 10/20/2018: BUN 14; Creatinine, Ser 0.86; Hemoglobin 11.4; Platelets 157; Potassium 4.6; Sodium 138   Recent Lipid Panel Lab Results  Component Value Date/Time   CHOL 114 05/21/2018 08:23 AM   TRIG 84 05/21/2018 08:23 AM   HDL 47 05/21/2018 08:23 AM   CHOLHDL 2.4 05/21/2018 08:23 AM   CHOLHDL 2.1 12/04/2016 08:42 AM   LDLCALC 50 05/21/2018 08:23  AM    Wt Readings from Last 3 Encounters:  05/21/19 168 lb 12.8 oz (76.6 kg)  01/13/19 179 lb (81.2 kg)  10/19/18 179 lb 6 oz (81.4 kg)     Exam:    Vital Signs:  BP 137/82   Pulse 67   Ht '5\' 9"'$  (1.753 m)   Wt 168 lb 12.8 oz (76.6 kg)   BMI 24.93 kg/m    General appearance: alert and no distress Lungs: no visual respiratory difficulty Skin: Skin color, texture, turgor normal. No rashes or lesions Neurologic: Mental status: Alert, oriented, thought content appropriate  ASSESSMENT & PLAN:    1. Inferior STEMI, status post 4 DES to the RCA (2017[place] 2. LVEF 40-45% (04/2018) 3. Coronary artery disease status post Taxus DES to the first diagonal and 2002 4. PTSD 5. Hypertension 6. Dyslipidemia  Cory [name] seems to be doing well.  He denies any worsening shortness of breath or chest pain.  His EF is still reduced at 40 to 45% as of May 2019.  I added carvedilol to his regimen.  He had very good control of his cholesterol with an LDL of 50 however his PCP decreased his dose from 80 to 40 mg rosuvastatin.  As he was having no side effects, I would recommend increasing it because the LDL jumped back up to 70.  Aggressive therapy is still recommended given the fact he had acute ST elevation plaque rupture.  We will repeat a lipid profile in 3 months and get a limited echo at that time to see if there is been any improvement in LV function.  COVID-19 Education: The signs and symptoms of COVID-19 were discussed with the patient and how to seek care for testing (follow up with PCP or arrange E-visit).  The importance of social distancing was discussed today.  Patient Risk:   After full review of this patients clinical status, I feel that they are at least moderate risk at this time.  Time:   Today, I have spent 25 minutes with the patient with telehealth technology discussing ischemic cardiomyopathy, coronary artery disease, hypertension and dyslipidemia..     Medication  Adjustments/Labs and Tests Ordered: Current medicines are reviewed at length with the patient today.  Concerns regarding medicines are outlined above.  Tests Ordered: No orders of the defined types were placed in this encounter.   Medication Changes: No orders of the defined types were placed in this encounter.   Disposition:  in 1 year(s)  [name], MD, [facility], [place] Director of the Advanced Lipid Disorders &  Cardiovascular Risk Reduction Clinic Diplomate of the American Board of Clinical Lipidology Attending Cardiologist  Direct Dial: [phone]  Fax: [phone]  Website:  [email or web address]  [name], MD  05/21/2019 1:52 PM

## 2019-05-21 NOTE — Patient Instructions (Addendum)
Medication Instructions:  INCREASE atorvastatin to 80mg  daily If you need a refill on your cardiac medications before your next appointment, please call your pharmacy.   Lab work: FASTING lab work in 3 months (late August/September 2020) to check cholesterol If you have labs (blood work) drawn today and your tests are completely normal, you will receive your results only by: Marland Kitchen MyChart Message (if you have MyChart) OR . A paper copy in the mail If you have any lab test that is abnormal or we need to change your treatment, we will call you to review the results.  Testing/Procedures: Your physician has requested that you have an echocardiogram. Echocardiography is a painless test that uses sound waves to create images of your heart. It provides your doctor with information about the size and shape of your heart and how well your heart's chambers and valves are working. This procedure takes approximately one hour. There are no restrictions for this procedure. -- due late August/September 2020 -- done at [address]  Follow-Up: At [facility], you and your health needs are our priority.  As part of our continuing mission to provide you with exceptional heart care, we have created designated Provider Care Teams.  These Care Teams include your primary Cardiologist (physician) and Advanced Practice Providers (APPs -  Physician Assistants and Nurse Practitioners) who all work together to provide you with the care you need, when you need it. You will need a follow up appointment in 12 months.  Please call our office 2 months in advance to schedule this appointment.  You may see [name], MD or one of the following Advanced Practice Providers on your designated Care Team: [place], [place] . Fabian Sharp, PA-C

## 2019-05-25 IMAGING — MR MR KNEE*L* W/O CM
4 of 6 series · 15 of 40 positions shown · non-contrast
Comparison: 03/12/2017

CLINICAL DATA: Left knee pain for the last 7 months

EXAM:
MRI OF THE LEFT KNEE WITHOUT CONTRAST
TECHNIQUE: Multiplanar, multisequence MR imaging of the knee was performed. No
intravenous contrast was administered.

[Series 5: pdfs axial blade · axial · 3.0mm · 0.58mm/px · z∈[-58,+13]mm · 3 of 30 slices shown]
[im 5/30]
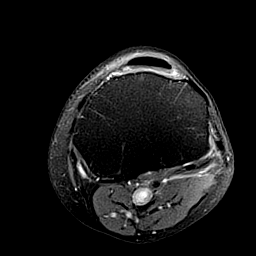
[im 17/30]
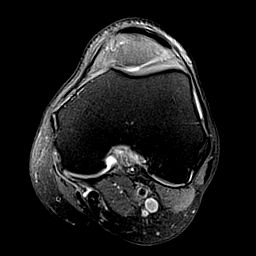
[im 25/30]
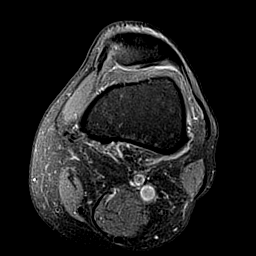

[Series 6: T1 · coronal · 3.0mm · 0.22mm/px · 6 of 26 slices shown]
[im 1/26]
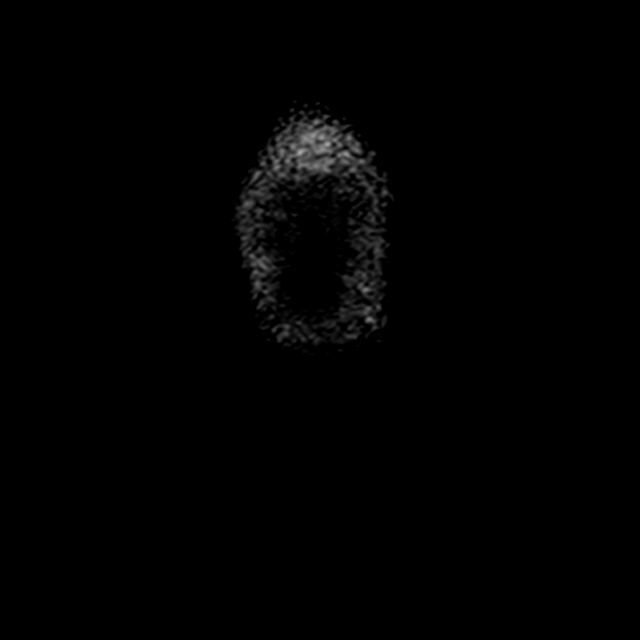
[im 5/26]
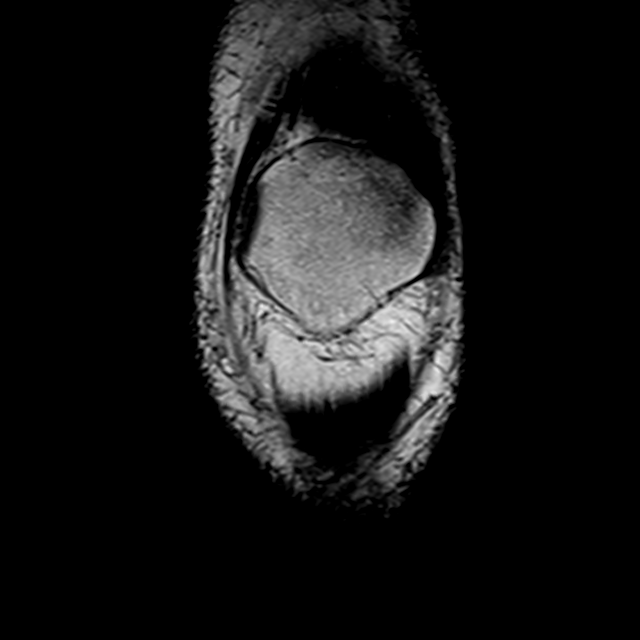
[im 9/26]
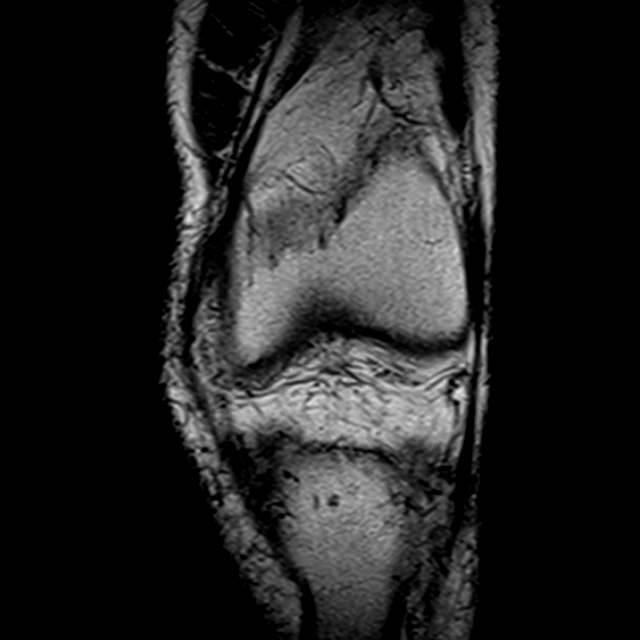
[im 13/26]
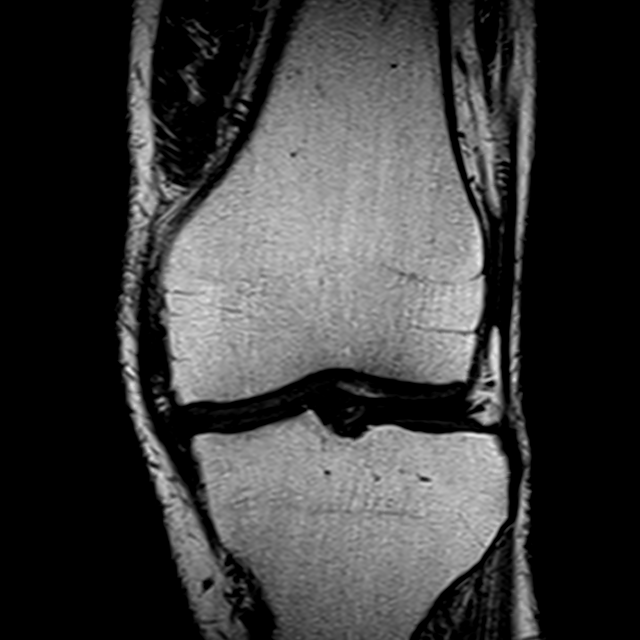
[im 17/26]
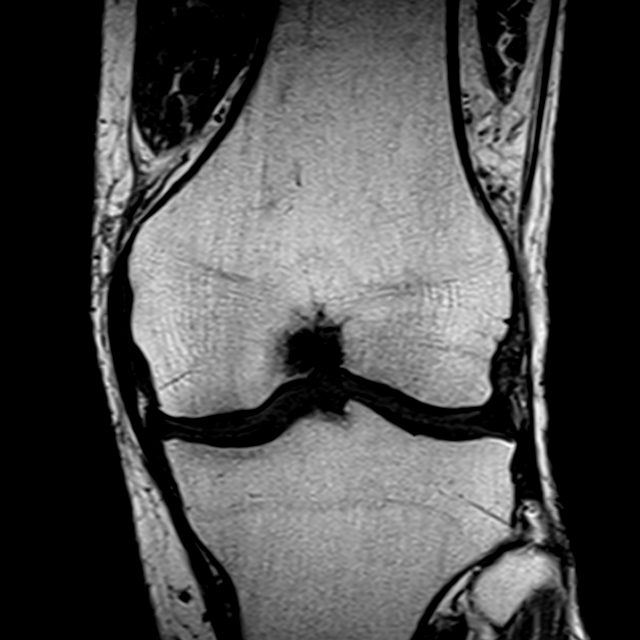
[im 21/26]
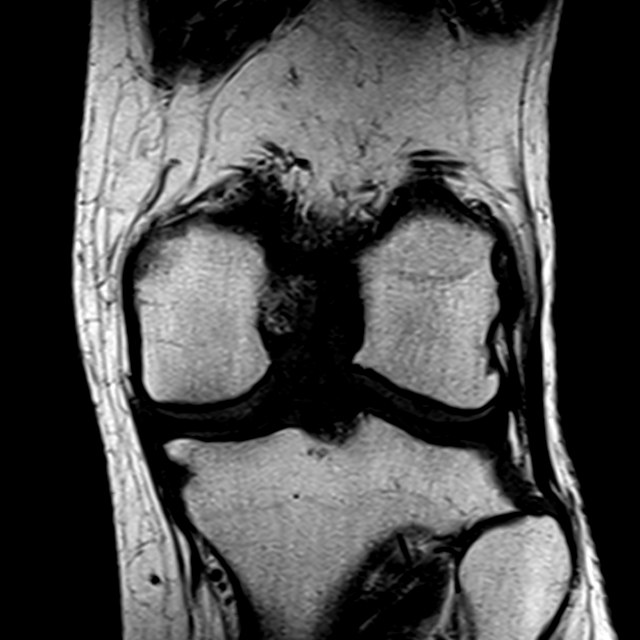

[Series 7: pdfs sag · sagittal · 3.0mm · 0.22mm/px · 3 of 27 slices shown]
[im 5/27]
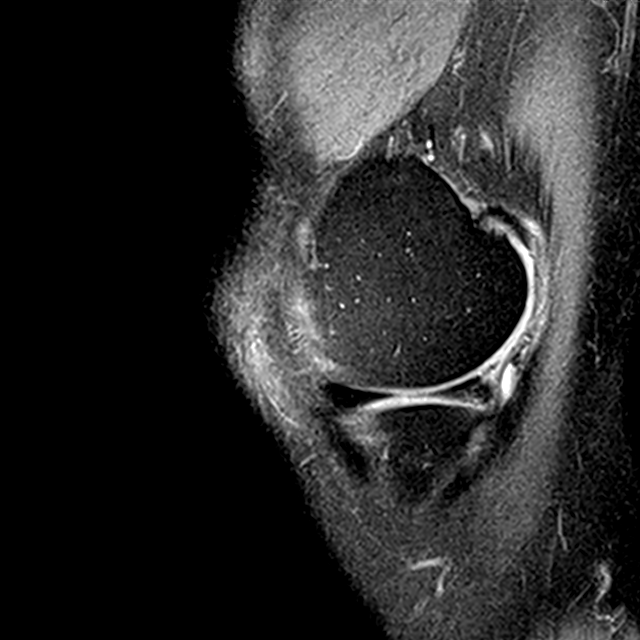
[im 14/27]
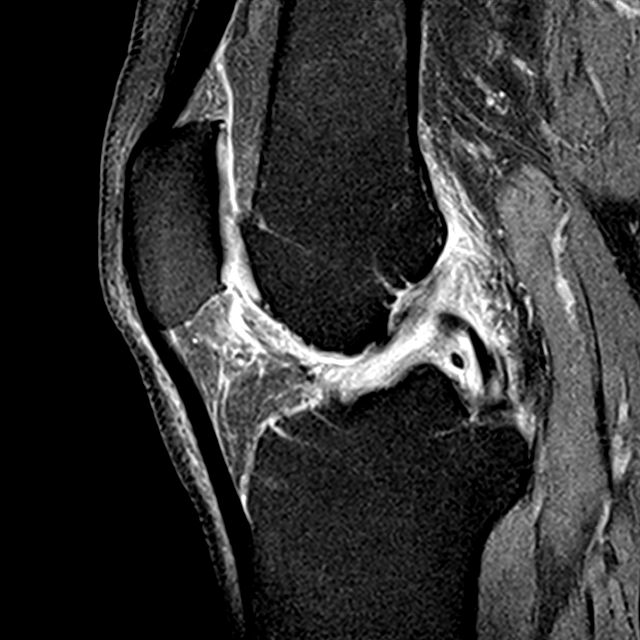
[im 22/27]
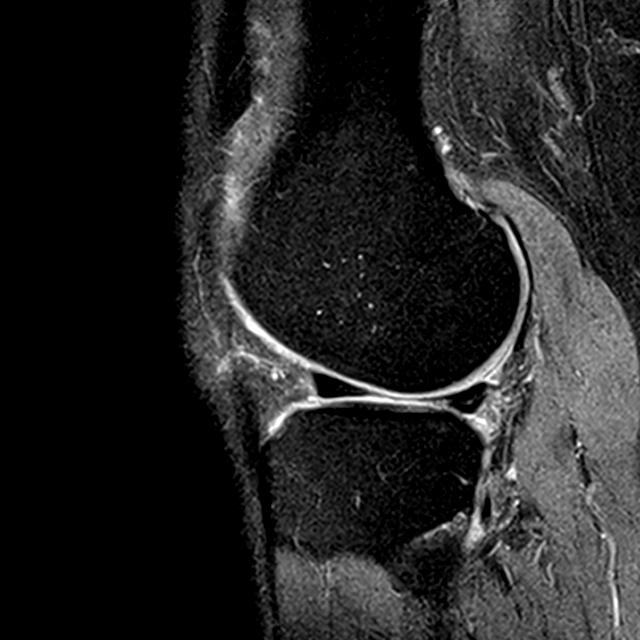

[Series 8: t2fs cor · coronal · 3.0mm · 0.22mm/px · 3 of 26 slices shown]
[im 5/26]
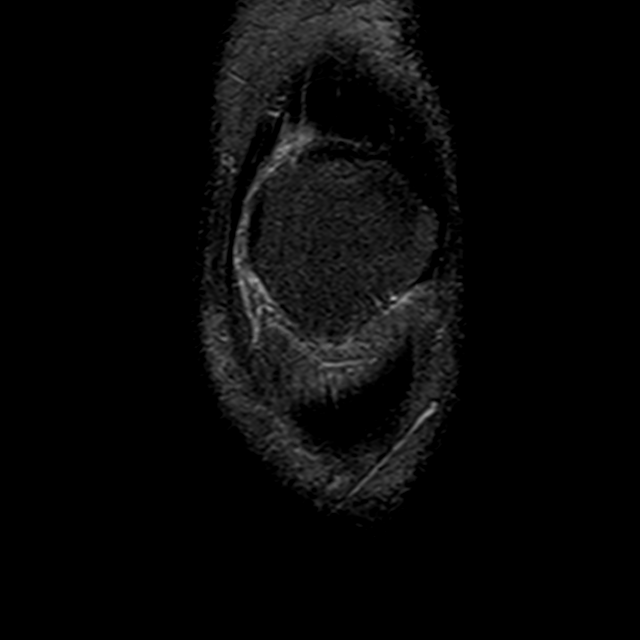
[im 13/26]
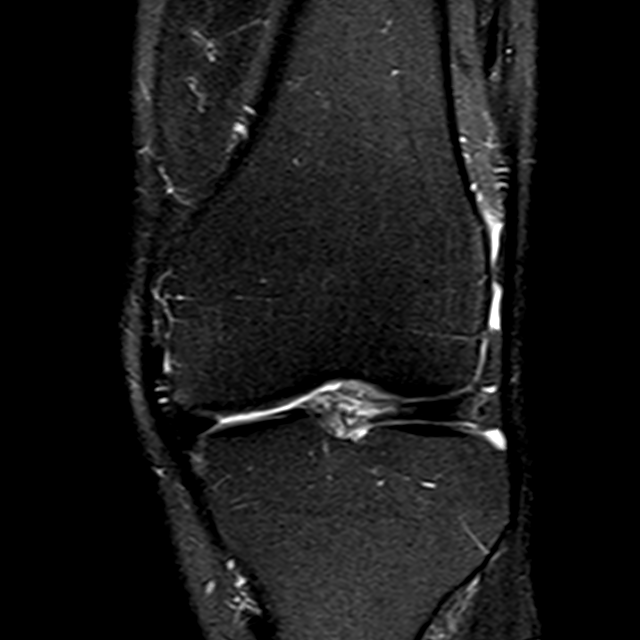
[im 21/26]
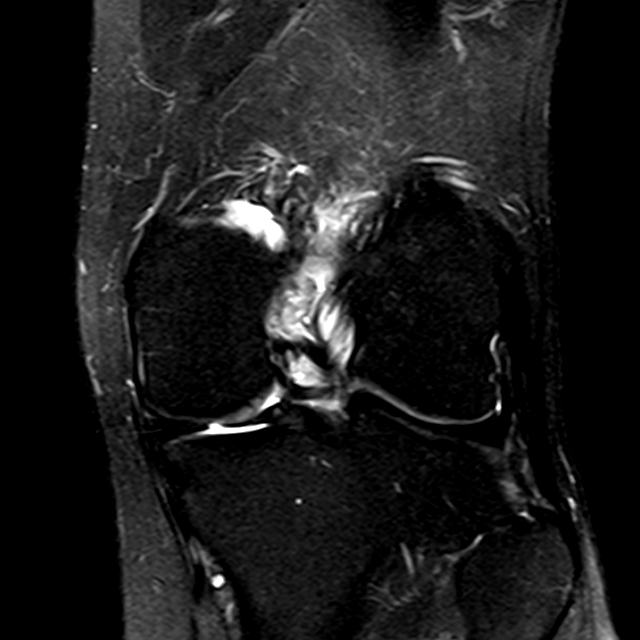

[15 of 40 positions shown; findings below may reference images not displayed]

FINDINGS: MENISCI

Medial meniscus: Oblique grade 3 tear and degenerative irregularity
along the inferior surface, images 6-9 series 7. Small free edge
tear noted on image [DATE] in the posterior horn.

Lateral meniscus: Horizontal signal in the midbody extends towards
but does not definitively reach the free edge.

LIGAMENTS

Cruciates: Increased signal and expansion in the ACL. Abnormal
increased signal in the PCL centrally and inferiorly.

Collaterals:  Unremarkable

CARTILAGE

Patellofemoral: Moderate chondral thinning medially in the
patellofemoral joint. 10 by 4 mm non-fragmented osteochondral lesion
of the lateral femoral trochlear groove on image [DATE].

Medial:  Mild to moderate degenerative chondral thinning.

Lateral:  Mild degenerative chondral thinning.

Joint:  Trace knee effusion.

Popliteal Fossa: Small Baker's cyst. Mild adjacent
semimembranosus-tibial collateral ligament bursitis.

Extensor Mechanism:  Unremarkable

Bones: No significant extra-articular osseous abnormalities
identified.

Other: No supplemental non-categorized findings.
IMPRESSION: 1. Oblique and free edge tear of the posterior horn medial meniscus
primarily involving the inferior surface.
2. Grade 2 signal in the midbody lateral meniscus.
3. Expanded ACL with increased internal signal compatible with ACL
cyst or prominent degeneration. There is also evidence of myxoid
degeneration in the PCL.
4. Mild to moderate degrees of degenerative chondral thinning. Small
non-fragmented free osteochondral lesion at the lateral femoral
trochlear groove.
5. Small Baker's cyst with adjacent semimembranosus-tibial
collateral ligament bursitis.
6. Trace knee effusion.

## 2019-07-30 DIAGNOSIS — M7552 Bursitis of left shoulder: Secondary | ICD-10-CM | POA: Diagnosis not present

## 2019-07-30 DIAGNOSIS — M25512 Pain in left shoulder: Secondary | ICD-10-CM | POA: Diagnosis not present

## 2019-08-23 ENCOUNTER — Other Ambulatory Visit (HOSPITAL_COMMUNITY): Payer: Medicare HMO

## 2019-08-23 DIAGNOSIS — R69 Illness, unspecified: Secondary | ICD-10-CM | POA: Diagnosis not present

## 2019-08-26 ENCOUNTER — Ambulatory Visit (HOSPITAL_COMMUNITY): Payer: Medicare HMO | Attending: Cardiology

## 2019-08-26 ENCOUNTER — Other Ambulatory Visit: Payer: Self-pay

## 2019-08-26 DIAGNOSIS — I255 Ischemic cardiomyopathy: Secondary | ICD-10-CM | POA: Insufficient documentation

## 2019-08-31 ENCOUNTER — Telehealth: Payer: Self-pay | Admitting: Internal Medicine

## 2019-08-31 NOTE — Telephone Encounter (Signed)
Called and notified patient of ECHO results. Patient verbalized understanding.

## 2019-08-31 NOTE — Telephone Encounter (Signed)
New message ° ° °Patient is returning call about echo results. Please call. °

## 2019-09-08 ENCOUNTER — Other Ambulatory Visit: Payer: Self-pay | Admitting: Internal Medicine

## 2019-09-08 DIAGNOSIS — I10 Essential (primary) hypertension: Secondary | ICD-10-CM

## 2019-09-08 MED ORDER — IRBESARTAN 300 MG PO TABS: 300.0000 mg | ORAL_TABLET | Freq: Every day | ORAL | 2 refills | Status: DC

## 2019-09-08 NOTE — Telephone Encounter (Signed)
° ° ° °*  STAT* If patient is at the pharmacy, call can be transferred to refill team.   1. Which medications need to be refilled? (please list name of each medication and dose if known)   irbesartan (AVAPRO) 300 MG tablet     2. Which pharmacy/location (including street and city if local pharmacy) is medication to be sent to? [facility] [place]  3. Do they need a 30 day or 90 day supply? [place]

## 2019-09-08 NOTE — Telephone Encounter (Signed)
Requested Prescriptions   Signed Prescriptions Disp Refills  . irbesartan (AVAPRO) 300 MG tablet 90 tablet 2    Sig: Take 1 tablet (300 mg total) by mouth daily.    Authorizing Provider: [name]    Ordering User: Raelene Bott, Bettina Warn L

## 2019-10-20 DIAGNOSIS — Z96652 Presence of left artificial knee joint: Secondary | ICD-10-CM | POA: Diagnosis not present

## 2020-01-13 DIAGNOSIS — M545 Low back pain, unspecified: Secondary | ICD-10-CM | POA: Insufficient documentation

## 2020-01-13 DIAGNOSIS — M47896 Other spondylosis, lumbar region: Secondary | ICD-10-CM | POA: Diagnosis not present

## 2020-01-17 ENCOUNTER — Ambulatory Visit (INDEPENDENT_AMBULATORY_CARE_PROVIDER_SITE_OTHER): Payer: No Typology Code available for payment source | Admitting: Internal Medicine

## 2020-04-10 ENCOUNTER — Encounter (HOSPITAL_COMMUNITY)
Admission: EM | Disposition: A | Discharge status: Home / Self Care | Payer: Self-pay | Source: Home / Self Care | Attending: Family Medicine

## 2020-04-10 ENCOUNTER — Other Ambulatory Visit: Payer: Self-pay

## 2020-04-10 ENCOUNTER — Encounter (HOSPITAL_COMMUNITY): Payer: Self-pay | Admitting: *Deleted

## 2020-04-10 ENCOUNTER — Inpatient Hospital Stay (HOSPITAL_COMMUNITY)
Admission: EM | Admit: 2020-04-10 | Discharge: 2020-04-12 | DRG: 378 | Disposition: A | Discharge status: Home / Self Care | Payer: No Typology Code available for payment source | Attending: Family Medicine | Admitting: Family Medicine

## 2020-04-10 DIAGNOSIS — I252 Old myocardial infarction: Secondary | ICD-10-CM

## 2020-04-10 DIAGNOSIS — I214 Non-ST elevation (NSTEMI) myocardial infarction: Secondary | ICD-10-CM | POA: Diagnosis present

## 2020-04-10 DIAGNOSIS — F329 Major depressive disorder, single episode, unspecified: Secondary | ICD-10-CM | POA: Diagnosis present

## 2020-04-10 DIAGNOSIS — M1990 Unspecified osteoarthritis, unspecified site: Secondary | ICD-10-CM | POA: Diagnosis present

## 2020-04-10 DIAGNOSIS — K5731 Diverticulosis of large intestine without perforation or abscess with bleeding: Secondary | ICD-10-CM | POA: Diagnosis not present

## 2020-04-10 DIAGNOSIS — Z8379 Family history of other diseases of the digestive system: Secondary | ICD-10-CM

## 2020-04-10 DIAGNOSIS — K449 Diaphragmatic hernia without obstruction or gangrene: Secondary | ICD-10-CM

## 2020-04-10 DIAGNOSIS — K921 Melena: Secondary | ICD-10-CM | POA: Diagnosis not present

## 2020-04-10 DIAGNOSIS — Z7982 Long term (current) use of aspirin: Secondary | ICD-10-CM

## 2020-04-10 DIAGNOSIS — K5710 Diverticulosis of small intestine without perforation or abscess without bleeding: Secondary | ICD-10-CM | POA: Diagnosis present

## 2020-04-10 DIAGNOSIS — I5042 Chronic combined systolic (congestive) and diastolic (congestive) heart failure: Secondary | ICD-10-CM | POA: Diagnosis present

## 2020-04-10 DIAGNOSIS — D62 Acute posthemorrhagic anemia: Secondary | ICD-10-CM | POA: Diagnosis present

## 2020-04-10 DIAGNOSIS — I255 Ischemic cardiomyopathy: Secondary | ICD-10-CM | POA: Diagnosis not present

## 2020-04-10 DIAGNOSIS — E785 Hyperlipidemia, unspecified: Secondary | ICD-10-CM | POA: Diagnosis present

## 2020-04-10 DIAGNOSIS — K2270 Barrett's esophagus without dysplasia: Secondary | ICD-10-CM | POA: Diagnosis not present

## 2020-04-10 DIAGNOSIS — I2510 Atherosclerotic heart disease of native coronary artery without angina pectoris: Secondary | ICD-10-CM | POA: Diagnosis not present

## 2020-04-10 DIAGNOSIS — I10 Essential (primary) hypertension: Secondary | ICD-10-CM | POA: Diagnosis present

## 2020-04-10 DIAGNOSIS — Z96652 Presence of left artificial knee joint: Secondary | ICD-10-CM | POA: Diagnosis present

## 2020-04-10 DIAGNOSIS — I110 Hypertensive heart disease with heart failure: Secondary | ICD-10-CM | POA: Diagnosis present

## 2020-04-10 DIAGNOSIS — K922 Gastrointestinal hemorrhage, unspecified: Secondary | ICD-10-CM | POA: Diagnosis not present

## 2020-04-10 DIAGNOSIS — Z20822 Contact with and (suspected) exposure to covid-19: Secondary | ICD-10-CM | POA: Diagnosis present

## 2020-04-10 DIAGNOSIS — F4310 Post-traumatic stress disorder, unspecified: Secondary | ICD-10-CM | POA: Diagnosis present

## 2020-04-10 DIAGNOSIS — Z79899 Other long term (current) drug therapy: Secondary | ICD-10-CM

## 2020-04-10 DIAGNOSIS — Z888 Allergy status to other drugs, medicaments and biological substances status: Secondary | ICD-10-CM

## 2020-04-10 DIAGNOSIS — Z955 Presence of coronary angioplasty implant and graft: Secondary | ICD-10-CM

## 2020-04-10 HISTORY — PX: ESOPHAGOGASTRODUODENOSCOPY: SHX5428

## 2020-04-10 HISTORY — PX: BIOPSY: SHX5522

## 2020-04-10 LAB — CBC WITH DIFFERENTIAL/PLATELET
Abs Immature Granulocytes: 0.02 10*3/uL (ref 0.00–0.07)
Basophils Absolute: 0 10*3/uL (ref 0.0–0.1)
Basophils Relative: 1 %
Eosinophils Absolute: 0.2 10*3/uL (ref 0.0–0.5)
Eosinophils Relative: 4 %
HCT: 29.1 % — ABNORMAL LOW (ref 39.0–52.0)
Hemoglobin: 9.8 g/dL — ABNORMAL LOW (ref 13.0–17.0)
Immature Granulocytes: 1 %
Lymphocytes Relative: 36 %
Lymphs Abs: 1.6 10*3/uL (ref 0.7–4.0)
MCH: 31.7 pg (ref 26.0–34.0)
MCHC: 33.7 g/dL (ref 30.0–36.0)
MCV: 94.2 fL (ref 80.0–100.0)
Monocytes Absolute: 0.4 10*3/uL (ref 0.1–1.0)
Monocytes Relative: 8 %
Neutro Abs: 2.2 10*3/uL (ref 1.7–7.7)
Neutrophils Relative %: 50 %
Platelets: 195 10*3/uL (ref 150–400)
RBC: 3.09 MIL/uL — ABNORMAL LOW (ref 4.22–5.81)
RDW: 12.6 % (ref 11.5–15.5)
WBC: 4.4 10*3/uL (ref 4.0–10.5)
nRBC: 0 % (ref 0.0–0.2)

## 2020-04-10 LAB — COMPREHENSIVE METABOLIC PANEL
ALT: 16 U/L (ref 0–44)
AST: 14 U/L — ABNORMAL LOW (ref 15–41)
Albumin: 3.8 g/dL (ref 3.5–5.0)
Alkaline Phosphatase: 58 U/L (ref 38–126)
Anion gap: 8 (ref 5–15)
BUN: 21 mg/dL (ref 8–23)
CO2: 25 mmol/L (ref 22–32)
Calcium: 9 mg/dL (ref 8.9–10.3)
Chloride: 104 mmol/L (ref 98–111)
Creatinine, Ser: 0.94 mg/dL (ref 0.61–1.24)
GFR calc Af Amer: >60 mL/min (ref >60)
GFR calc non Af Amer: >60 mL/min (ref >60)
Glucose, Bld: 115 mg/dL — ABNORMAL HIGH (ref 70–99)
Potassium: 4.1 mmol/L (ref 3.5–5.1)
Sodium: 137 mmol/L (ref 135–145)
Total Bilirubin: 0.7 mg/dL (ref 0.3–1.2)
Total Protein: 6.2 g/dL — ABNORMAL LOW (ref 6.5–8.1)

## 2020-04-10 LAB — CBC
HCT: 29.1 % — ABNORMAL LOW (ref 39.0–52.0)
Hemoglobin: 9.6 g/dL — ABNORMAL LOW (ref 13.0–17.0)
MCH: 31.5 pg (ref 26.0–34.0)
MCHC: 33 g/dL (ref 30.0–36.0)
MCV: 95.4 fL (ref 80.0–100.0)
Platelets: 191 10*3/uL (ref 150–400)
RBC: 3.05 MIL/uL — ABNORMAL LOW (ref 4.22–5.81)
RDW: 12.6 % (ref 11.5–15.5)
WBC: 4.3 10*3/uL (ref 4.0–10.5)
nRBC: 0 % (ref 0.0–0.2)

## 2020-04-10 LAB — PROTIME-INR
INR: 1.1 (ref 0.8–1.2)
Prothrombin Time: 13.6 seconds (ref 11.4–15.2)

## 2020-04-10 LAB — TYPE AND SCREEN
ABO/RH(D): O POS
Antibody Screen: NEGATIVE

## 2020-04-10 LAB — RESPIRATORY PANEL BY RT PCR (FLU A&B, COVID)
Influenza A by PCR: NEGATIVE
Influenza B by PCR: NEGATIVE
SARS Coronavirus 2 by RT PCR: NEGATIVE

## 2020-04-10 SURGERY — EGD (ESOPHAGOGASTRODUODENOSCOPY)
Anesthesia: Moderate Sedation

## 2020-04-10 MED ORDER — HYDROXYZINE HCL 10 MG PO TABS: 10.0000 mg | ORAL_TABLET | Freq: Three times a day (TID) | ORAL | Status: DC | PRN

## 2020-04-10 MED ORDER — ATORVASTATIN CALCIUM 40 MG PO TABS
80.0000 mg | ORAL_TABLET | Freq: Every day | ORAL | Status: DC
  Administered 2020-04-11 – 2020-04-12 (×2): 80 mg via ORAL
  Filled 2020-04-10: qty 2
  Filled 2020-04-10: qty 1
  Filled 2020-04-10: qty 2

## 2020-04-10 MED ORDER — IRBESARTAN 150 MG PO TABS: 150.0000 mg | ORAL_TABLET | Freq: Every day | ORAL | Status: DC

## 2020-04-10 MED ORDER — LACTATED RINGERS IV BOLUS
500.0000 mL | Freq: Once | INTRAVENOUS | Status: AC
  Administered 2020-04-10: 500 mL via INTRAVENOUS

## 2020-04-10 MED ORDER — ONDANSETRON HCL 4 MG/2ML IJ SOLN: 4.0000 mg | Freq: Four times a day (QID) | INTRAMUSCULAR | Status: DC | PRN

## 2020-04-10 MED ORDER — STERILE WATER FOR IRRIGATION IR SOLN
Status: DC | PRN
  Administered 2020-04-10: 2.5 mL

## 2020-04-10 MED ORDER — LIDOCAINE VISCOUS HCL 2 % MT SOLN
OROMUCOSAL | Status: DC | PRN
  Administered 2020-04-10: 1 via OROMUCOSAL

## 2020-04-10 MED ORDER — TRAZODONE HCL 50 MG PO TABS: 50.0000 mg | ORAL_TABLET | Freq: Every evening | ORAL | Status: DC | PRN

## 2020-04-10 MED ORDER — SODIUM CHLORIDE 0.9% FLUSH
3.0000 mL | Freq: Two times a day (BID) | INTRAVENOUS | Status: DC
  Administered 2020-04-10 – 2020-04-12 (×3): 3 mL via INTRAVENOUS

## 2020-04-10 MED ORDER — SODIUM CHLORIDE 0.9 % IV SOLN
80.0000 mg | Freq: Once | INTRAVENOUS | Status: AC
  Administered 2020-04-10: 80 mg via INTRAVENOUS
  Filled 2020-04-10: qty 80

## 2020-04-10 MED ORDER — MIDAZOLAM HCL 5 MG/5ML IJ SOLN
INTRAMUSCULAR | Status: DC | PRN
  Administered 2020-04-10: 1 mg via INTRAVENOUS
  Administered 2020-04-10 (×2): 2 mg via INTRAVENOUS

## 2020-04-10 MED ORDER — TRAZODONE HCL 50 MG PO TABS
100.0000 mg | ORAL_TABLET | Freq: Every day | ORAL | Status: DC
  Administered 2020-04-10 – 2020-04-11 (×2): 100 mg via ORAL
  Filled 2020-04-10 (×2): qty 1
  Filled 2020-04-10: qty 2

## 2020-04-10 MED ORDER — SODIUM CHLORIDE 0.9 % IV SOLN
8.0000 mg/h | INTRAVENOUS | Status: DC
  Administered 2020-04-10: 8 mg/h via INTRAVENOUS
  Filled 2020-04-10 (×8): qty 80

## 2020-04-10 MED ORDER — CARVEDILOL 3.125 MG PO TABS
3.1250 mg | ORAL_TABLET | Freq: Two times a day (BID) | ORAL | Status: DC
  Administered 2020-04-10 – 2020-04-12 (×4): 3.125 mg via ORAL
  Filled 2020-04-10 (×4): qty 1

## 2020-04-10 MED ORDER — LORATADINE 10 MG PO TABS
10.0000 mg | ORAL_TABLET | Freq: Every day | ORAL | Status: DC
  Administered 2020-04-11 – 2020-04-12 (×2): 10 mg via ORAL
  Filled 2020-04-10 (×2): qty 1

## 2020-04-10 MED ORDER — MEPERIDINE HCL 50 MG/ML IJ SOLN
INTRAMUSCULAR | Status: AC
  Filled 2020-04-10: qty 1

## 2020-04-10 MED ORDER — DEXTROSE-NACL 5-0.45 % IV SOLN: INTRAVENOUS | Status: DC

## 2020-04-10 MED ORDER — SODIUM CHLORIDE 0.9% FLUSH
3.0000 mL | INTRAVENOUS | Status: DC | PRN
  Administered 2020-04-12: 3 mL via INTRAVENOUS

## 2020-04-10 MED ORDER — POLYETHYLENE GLYCOL 3350 17 G PO PACK: 17.0000 g | PACK | Freq: Every day | ORAL | Status: DC | PRN

## 2020-04-10 MED ORDER — VENLAFAXINE HCL 25 MG PO TABS
ORAL_TABLET | ORAL | Status: AC
  Filled 2020-04-10: qty 3

## 2020-04-10 MED ORDER — LIDOCAINE VISCOUS HCL 2 % MT SOLN
OROMUCOSAL | Status: AC
  Filled 2020-04-10: qty 15

## 2020-04-10 MED ORDER — VENLAFAXINE HCL 75 MG PO TABS: 75.0000 mg | ORAL_TABLET | Freq: Every day | ORAL | Status: DC

## 2020-04-10 MED ORDER — OXYCODONE HCL 5 MG PO TABS: 5.0000 mg | ORAL_TABLET | Freq: Four times a day (QID) | ORAL | Status: DC | PRN

## 2020-04-10 MED ORDER — MIDAZOLAM HCL 5 MG/5ML IJ SOLN
INTRAMUSCULAR | Status: AC
  Filled 2020-04-10: qty 10

## 2020-04-10 MED ORDER — ONDANSETRON HCL 4 MG PO TABS: 4.0000 mg | ORAL_TABLET | Freq: Four times a day (QID) | ORAL | Status: DC | PRN

## 2020-04-10 MED ORDER — SODIUM CHLORIDE 0.9 % IV SOLN: 250.0000 mL | INTRAVENOUS | Status: DC | PRN

## 2020-04-10 MED ORDER — IRBESARTAN 150 MG PO TABS
150.0000 mg | ORAL_TABLET | Freq: Every day | ORAL | Status: DC
  Administered 2020-04-11 – 2020-04-12 (×2): 150 mg via ORAL
  Filled 2020-04-10 (×2): qty 1

## 2020-04-10 MED ORDER — MEPERIDINE HCL 50 MG/ML IJ SOLN
INTRAMUSCULAR | Status: DC | PRN
  Administered 2020-04-10 (×2): 25 mg via INTRAVENOUS

## 2020-04-10 MED ORDER — BUPROPION HCL ER (SR) 100 MG PO TB12
100.0000 mg | ORAL_TABLET | Freq: Every day | ORAL | Status: DC
  Administered 2020-04-10 – 2020-04-12 (×3): 100 mg via ORAL
  Filled 2020-04-10 (×3): qty 1

## 2020-04-10 MED ORDER — FLUTICASONE PROPIONATE 50 MCG/ACT NA SUSP: 1.0000 | Freq: Every day | NASAL | Status: DC | PRN

## 2020-04-10 MED ORDER — VENLAFAXINE HCL 37.5 MG PO TABS
75.0000 mg | ORAL_TABLET | Freq: Two times a day (BID) | ORAL | Status: DC
  Administered 2020-04-10 – 2020-04-12 (×5): 75 mg via ORAL
  Filled 2020-04-10: qty 2
  Filled 2020-04-10: qty 1
  Filled 2020-04-10 (×3): qty 2
  Filled 2020-04-10: qty 1
  Filled 2020-04-10: qty 2
  Filled 2020-04-10 (×2): qty 1

## 2020-04-10 NOTE — ED Notes (Signed)
Have notified pharmacy of need for Protonix

## 2020-04-10 NOTE — ED Provider Notes (Signed)
[facility] EMERGENCY DEPARTMENT Provider Note   CSN: [id number] Arrival date & time: 04/10/20  1119     History Chief Complaint  Patient presents with  . Rectal Bleeding    Cory [name] is a 74 y.o. male.  HPI Patient presents with rectal bleeding.  States he has had for the better part of the week now.  States that started out with blood then the stool turned black.  States it is tarry and foul-smelling along with blood.  States he got seen by his PCP on Friday.  States he do blood work and was told to come to the ER.  He is presenting here today on Monday.  States he was told he had something started with an H.  Patient's wife states that hemoglobin was 12 on Friday.  States he feels lightheaded.  States the bleeding has continued.  No abdominal pain.  No fevers.  Has seen Dr. Laural Golden in the past and would like to see him again if possible.    Past Medical History:  Diagnosis Date  . Anemia   . Arthritis   . Coronary artery disease    a. 09/2001 PCI/BMS to D1: 2.5 x 15 Express 2 BMS;  b. 2011 Cath: stable anatomy; c. 03/2014 Low risk MV, EF 54%;  d. 09/2016 Inf STEMI/PCI [facility]): LM nl, LAD 77m D1 nl w/ 90% in 155minf branch, LCX nl, RCA 99 (Synergy DES x 4 - 3.5x28 prox, 3.5x3811m.5x16d, 2.25x16 to RPDA), EF 55%.  . GMarland KitchenRD (gastroesophageal reflux disease)   . Hyperlipidemia   . Hypertensive heart disease   . Myocardial infarction ([place]CC[place] Ac[place]017  . Pneumonia   . PTSD (post-traumatic stress disorder)     Patient Active Problem List   Diagnosis Date Noted  . 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 S/P left knee arthroscopy 02/05/18 02/12/2018  . Ischemic cardiomyopathy 11/18/2017  . Acute pain of left knee 01/24/2017  . NSTEMI (non-ST elevated myocardial infarction) (HCC[place]1/09/2017  . ST elevation myocardial infarction (STEMI) of inferior wall, subsequent episode of care (HCC[place]angum1/09/2017  . Mixed hyperlipidemia 01/08/2017  . Sinus bradycardia 01/08/2017   . Left tennis elbow 11/08/2013  . Stable angina (HCCRedkey1/09/2013  . CAD in native artery 11/08/2013  . PTSD (post-traumatic stress disorder) 11/08/2013  . Essential hypertension 11/08/2013  . Dyslipidemia 11/08/2013  . Fracture of phalanx of thumb 12/03/2011    Past Surgical History:  Procedure Laterality Date  . CARDIAC CATHETERIZATION  05/17/2010   left main free of significant lesion; LAD with 60% lesion in mid segment & 60-70% lesion in mid-distal segment with subtotal occlusion at apex & small side branch of diagonal 1 that is subtotally occluded; ramus intermedius with 90% ostial stenosis; L Cfx w/mild luminal irreg; RCA is dominant vessel with diffuse disease, 50% prox lesion & 60% lesion in mid segment & 60% lesion in PDA  . CATARACT EXTRACTION W/PHACO Right 06/07/2015   Procedure: CATARACT EXTRACTION PHACO AND INTRAOCULAR LENS PLACEMENT (IOC);  Surgeon: RoyMarylynn PearsonD;  [facility] NationalService: Ophthalmology;  Laterality: Right;  . COLONOSCOPY  02/25/2012   Procedure: COLONOSCOPY;  Surgeon: MarJamesetta SoD;  Location: [facility];  Service: Gastroenterology;  Laterality: N/A;  . CORONARY ANGIOPLASTY WITH STENT PLACEMENT  09/2001   Taxus (2.5x15m46mtent to 1st diagonal   . ESOPHAGEAL DILATION N/A 08/14/2016   Procedure: ESOPHAGEAL DILATION;  Surgeon: NajeRogene Houston;  Location: [facility];  Service: Endoscopy;  Laterality: N/A;  . ESOPHAGOGASTRODUODENOSCOPY  02/25/2012   Procedure: ESOPHAGOGASTRODUODENOSCOPY (EGD);  Surgeon: Jamesetta So, MD;  Location: [facility];  Service: Gastroenterology;  Laterality: N/A;  . ESOPHAGOGASTRODUODENOSCOPY N/A 07/28/2014   Procedure: ESOPHAGOGASTRODUODENOSCOPY (EGD);  Surgeon: Rogene Houston, MD;  Location: [facility];  Service: Endoscopy;  Laterality: N/A;  200-rescheduled to [place] notified pt  . ESOPHAGOGASTRODUODENOSCOPY N/A 08/14/2016   Procedure: ESOPHAGOGASTRODUODENOSCOPY (EGD);  Surgeon: Rogene Houston, MD;  Location:  [facility];  Service: Endoscopy;  Laterality: N/A;  100  . EYE SURGERY Bilateral    lasik surgery   . KNEE ARTHROSCOPY WITH MEDIAL MENISECTOMY Left 02/05/2018   Procedure: LEFT KNEE ARTHROSCOPY WITH PARTIAL MEDIAL MENISECTOMY;  Surgeon: Carole Civil, MD;  Location: [facility] ORS;  Service: Orthopedics;  Laterality: Left;  . KNEE SURGERY    . LATERAL EPICONDYLE RELEASE Left 01/21/2014   Procedure: Left Tennis Elbow Release with debridement  tendon repair with reattachment;  Surgeon: Carole Civil, MD;  Location: [facility] ORS;  Service: Orthopedics;  Laterality: Left;  . penile implant    . PROSTATE SURGERY    . SHOULDER SURGERY    . TONSILLECTOMY    . TOTAL KNEE ARTHROPLASTY Left 10/19/2018   Procedure: LEFT TOTAL KNEE ARTHROPLASTY;  Surgeon: Vickey Huger, MD;  Location: [facility] ORS;  Service: Orthopedics;  Laterality: Left;  with abductor block failed spinal  . TRANSTHORACIC ECHOCARDIOGRAM  03/2010   EF=>55%; LV mildly dilated; LA mod dilated; mild MR & TR; normal RVSP; mild pulm valve regurg       Family History  Problem Relation Age of Onset  . Colitis Maternal Aunt     Social History   Tobacco Use  . Smoking status: Never Smoker  . Smokeless tobacco: Never Used  Substance Use Topics  . Alcohol use: No  . Drug use: No    Home Medications Prior to Admission medications   Medication Sig Start Date End Date Taking? Authorizing Provider  acetaminophen (TYLENOL) 325 MG tablet Take 650 mg by mouth daily as needed for moderate pain or headache.    [provider]  aspirin EC 325 MG EC tablet Take 1 tablet (325 mg total) by mouth 2 (two) times daily. 10/20/18   Donia Ast, PA  atorvastatin (LIPITOR) 80 MG tablet Take 1 tablet (80 mg total) by mouth daily at 6 PM. 05/21/19   Hilty, Nadean Corwin, MD  buPROPion [facility] SR) 100 MG 12 hr tablet Take 100 mg by mouth daily.    [provider]  carvedilol (COREG) 3.125 MG tablet Take 1 tablet (3.125 mg total) by mouth  2 (two) times daily. 05/21/18 10/07/18  Hilty, Nadean Corwin, MD  fluticasone (FLONASE) 50 MCG/ACT nasal spray Place 1 spray into both nostrils daily as needed for allergies or rhinitis.    [provider]  irbesartan (AVAPRO) 300 MG tablet Take 1 tablet (300 mg total) by mouth daily. 09/08/19   Hilty, Nadean Corwin, MD  loratadine (CLARITIN) 10 MG tablet Take 10 mg by mouth daily.     [provider]  Magnesium Malate 1250 (141.7 Mg) MG TABS Take 1,250 mg by mouth every evening.     [provider]  methocarbamol (ROBAXIN) 500 MG tablet Take 1-2 tablets (500-1,000 mg total) by mouth every 6 (six) hours as needed for muscle spasms. 10/20/18   Donia Ast, PA  oxyCODONE (OXY IR/ROXICODONE) 5 MG immediate release tablet Take 1-2 tablets (5-10 mg total)  by mouth every 6 (six) hours as needed for moderate pain (pain score 4-6). 10/20/18   Donia Ast, PA  pantoprazole (PROTONIX) 40 MG tablet Take 1 tablet (40 mg total) by mouth 2 (two) times daily before a meal. 08/14/16   Rehman, Mechele Dawley, MD  traZODone (DESYREL) 100 MG tablet Take 100 mg by mouth at bedtime.     [provider]  venlafaxine (EFFEXOR) 75 MG tablet Take 75 mg by mouth daily.    [provider]    Allergies    Ace inhibitors, Benadryl [diphenhydramine hcl], and Vioxx [rofecoxib]  Review of Systems   Review of Systems  Constitutional: Positive for appetite change.  HENT: Negative for congestion.   Respiratory: Negative for shortness of breath.   Cardiovascular: Negative for chest pain.  Gastrointestinal: Positive for blood in stool. Negative for abdominal pain and nausea.  Genitourinary: Negative for flank pain.  Musculoskeletal: Negative for back pain.  Skin: Negative for rash.  Neurological: Positive for light-headedness. Negative for weakness.    Physical Exam Updated Vital Signs BP (!) 151/74   Pulse 92   Temp 98.3 F (36.8 C)   Resp 20   Ht 5' 9.75" (1.772 m)   SpO2  100%   BMI 24.39 kg/m   Physical Exam Vitals and nursing note reviewed.  HENT:     Head: Normocephalic.  Eyes:     Pupils: Pupils are equal, round, and reactive to light.  Cardiovascular:     Rate and Rhythm: Regular rhythm.  Pulmonary:     Breath sounds: No wheezing or rhonchi.  Abdominal:     Tenderness: There is no abdominal tenderness.  Genitourinary:    Comments: Bloody melena on rectal exam. Musculoskeletal:     Cervical back: Neck supple.  Skin:    Capillary Refill: Capillary refill takes less than 2 seconds.     Coloration: Skin is not pale.  Neurological:     Mental Status: He is alert and oriented to person, place, and time.  Psychiatric:        Mood and Affect: Mood normal.     ED Results / Procedures / Treatments   Labs (all labs ordered are listed, but only abnormal results are displayed) Labs Reviewed  COMPREHENSIVE METABOLIC PANEL - Abnormal; Notable for the following components:      Result Value   Glucose, Bld 115 (*)    Total Protein 6.2 (*)    AST 14 (*)    All other components within normal limits  CBC WITH DIFFERENTIAL/PLATELET - Abnormal; Notable for the following components:   RBC 3.09 (*)    Hemoglobin 9.8 (*)    HCT 29.1 (*)    All other components within normal limits  PROTIME-INR  TYPE AND SCREEN    EKG None  Radiology No results found.  Procedures Procedures (including critical care time)  Medications Ordered in ED Medications  pantoprazole (PROTONIX) 80 mg in sodium chloride 0.9 % 100 mL IVPB (has no administration in time range)  pantoprazole (PROTONIX) 80 mg in sodium chloride 0.9 % 100 mL (0.8 mg/mL) infusion (has no administration in time range)  lactated ringers bolus 500 mL (500 mLs Intravenous New Bag/Given 04/10/20 1242)    ED Course  I have reviewed the triage vital signs and the nursing notes.  Pertinent labs & imaging results that were available during my care of the patient were reviewed by me and considered  in my medical decision making (see chart for details).  MDM Rules/Calculators/A&P                      Patient with GI bleed.  Both bloody and melanotic.  Hemoglobin is 9.8.  Reportedly was 12 on Friday although I not have this lab work.  Patient however is overall well-appearing.  Good blood pressure but states he feels lightheaded and dizzy if he tries to walk around.  Will start Protonix drip.  Has not really had anything to eat today.  Will discuss with gastroenterology and admit to internal medicine.  Discussed with Dr. Laural Golden.  He will see patient in consult later today Final Clinical Impression(s) / ED Diagnoses Final diagnoses:  Gastrointestinal hemorrhage, unspecified gastrointestinal hemorrhage type  Acute blood loss anemia    Rx / DC Orders ED Discharge Orders    None       Davonna Belling, MD 04/10/20 1245

## 2020-04-10 NOTE — Clinical Social Work Note (Signed)
CSW completed [facility] Emergency Notification.  Lilton Pare, LCSW Transitions of Care Clinical Social Worker [facility] Ph: [phone] 

## 2020-04-10 NOTE — ED Triage Notes (Signed)
C/o rectal bleeding onset 2 days ago. C/o feeling weak

## 2020-04-10 NOTE — Plan of Care (Signed)

## 2020-04-10 NOTE — ED Notes (Signed)
Frank blood seen by Dr. Alvino Chapel upon rectal inspection.

## 2020-04-10 NOTE — Plan of Care (Signed)
  Problem: Education: Goal: Knowledge of General Education information will improve Description: Including pain rating scale, medication(s)/side effects and non-pharmacologic comfort measures Outcome: Progressing   Problem: Clinical Measurements: Goal: Cardiovascular complication will be avoided Outcome: Progressing   Problem: Elimination: Goal: Will not experience complications related to bowel motility Outcome: Progressing   Problem: Safety: Goal: Ability to remain free from injury will improve Outcome: Progressing   Problem: Pain Managment: Goal: General experience of comfort will improve Outcome: Progressing   Problem: Bowel/Gastric: Goal: Will show no signs and symptoms of gastrointestinal bleeding Outcome: Progressing   Problem: Education: Goal: Ability to identify signs and symptoms of gastrointestinal bleeding will improve Outcome: Progressing   Problem: Clinical Measurements: Goal: Complications related to the disease process, condition or treatment will be avoided or minimized Outcome: Progressing   Problem: Fluid Volume: Goal: Will show no signs and symptoms of excessive bleeding Outcome: Progressing

## 2020-04-10 NOTE — H&P (Signed)
Patient Demographics:    Cory [name], is a 75 y.o. male  MRN: [id number]   DOB - 1946/12/08  Admit Date - 04/10/2020  Outpatient Primary MD for the patient is Cory Sites, MD   Assessment & Plan:    Principal Problem:   GI bleed Active Problems:   CAD in native artery   Essential hypertension   NSTEMI (non-ST elevated myocardial infarction) [facility])   Ischemic cardiomyopathy    1) acute GI bleed-- -patient had BRBPR as well as melena EGD was in August 2017 revealing erosive reflux esophagitis and short segment Barrett's without dysplasia and small sliding hiatal hernia. -IV Protonix as ordered -Repeat EGD on 04/10/2020 with short segment Barrett's esophagus, no evidence of bleeding His last colonoscopy was in February 2013 revealing pancolonic diverticulosis. -Plan is for colonoscopy on 04/11/2020  2) acute on chronic symptomatic anemia--hemoglobin is down to 9.8 , it was 12.2 at outside facility within the last couple weeks -Patient with weakness, fatigue, dyspnea on exertion, however no dizziness or  palpitations or shortness of breath at rest -EGD and colonoscopy as above -Monitor H&H and transfuse as clinically indicated  3)HFrEF/ischemic cardiomyopathy --- patient with combined systolic and diastolic dysfunction CHF with EF of 40 to 45% based on echo from 08/19/2019--- --currently appears euvolemic and clinically no CHF flareup -Continue Avapro and Coreg  4)h/o CAD--stable, no chest pain, no ACS type symptoms, continue Coreg, hold aspirin due to GI bleed, Lipitor 80 mg daily  5) anxiety and depression--stable, continue Wellbutrin 100 mg daily, hydroxyzine as needed and trazodone nightly  With History of - Reviewed by me  Past Medical History:  Diagnosis Date  . Anemia   . Arthritis   .  Coronary artery disease    a. 09/2001 PCI/BMS to D1: 2.5 x 15 Express 2 BMS;  b. 2011 Cath: stable anatomy; c. 03/2014 Low risk MV, EF 54%;  d. 09/2016 Inf STEMI/PCI [facility]): LM nl, LAD 52m D1 nl w/ 90% in 140minf branch, LCX nl, RCA 99 (Synergy DES x 4 - 3.5x28 prox, 3.5x3835m.5x16d, 2.25x16 to RPDA), EF 55%.  . GMarland KitchenRD (gastroesophageal reflux disease)   . Hyperlipidemia   . Hypertensive heart disease   . Myocardial infarction ([place]017  . Pneumonia   . PTSD (post-traumatic stress disorder)       Past Surgical History:  Procedure Laterality Date  . CARDIAC CATHETERIZATION  05/17/2010   left main free of significant lesion; LAD with 60% lesion in mid segment & 60-70% lesion in mid-distal segment with subtotal occlusion at apex & small side branch of diagonal 1 that is subtotally occluded; ramus intermedius with 90% ostial stenosis; L Cfx w/mild luminal irreg; RCA is dominant vessel with diffuse disease, 50% prox lesion & 60% lesion in mid segment & 60% lesion in PDA  . CATARACT EXTRACTION W/PHACO Right 06/07/2015   Procedure: CATARACT EXTRACTION PHACO AND INTRAOCULAR LENS PLACEMENT (IOC);  Surgeon: Cory [name]  Cory Maxon, MD;  Location: [name];  Service: Ophthalmology;  Laterality: Right;  . COLONOSCOPY  02/25/2012   Procedure: COLONOSCOPY;  Surgeon: Cory So, MD;  Location: [name];  Service: Gastroenterology;  Laterality: N/A;  . CORONARY ANGIOPLASTY WITH STENT PLACEMENT  09/2001   Taxus (2.5x43m) stent to 1st diagonal   . ESOPHAGEAL DILATION N/A 08/14/2016   Procedure: ESOPHAGEAL DILATION;  Surgeon: NRogene Houston MD;  Location: [name];  Service: Endoscopy;  Laterality: N/A;  . ESOPHAGOGASTRODUODENOSCOPY  02/25/2012   Procedure: ESOPHAGOGASTRODUODENOSCOPY (EGD);  Surgeon: MJamesetta So MD;  Location: [name];  Service: Gastroenterology;  Laterality: N/A;  . ESOPHAGOGASTRODUODENOSCOPY N/A 07/28/2014   Procedure: ESOPHAGOGASTRODUODENOSCOPY (EGD);  Surgeon:  NRogene Houston MD;  Location: [name];  Service: Endoscopy;  Laterality: N/A;  200[name]  . ESOPHAGOGASTRODUODENOSCOPY N/A 08/14/2016   Procedure: ESOPHAGOGASTRODUODENOSCOPY (EGD);  Surgeon: NRogene Houston MD;  Location: [name];  Service: Endoscopy;  Laterality: N/A;  100  . EYE SURGERY Bilateral    lasik surgery   . KNEE ARTHROSCOPY WITH MEDIAL MENISECTOMY Left 02/05/2018   Procedure: LEFT KNEE ARTHROSCOPY WITH PARTIAL MEDIAL MENISECTOMY;  Surgeon: HCarole Civil MD;  Location: [name];  Service: Orthopedics;  Laterality: Left;  . KNEE SURGERY    . LATERAL EPICONDYLE RELEASE Left 01/21/2014   Procedure: Left Tennis Elbow Release with debridement  tendon repair with reattachment;  Surgeon: Cory Civil MD;  Location: Cory [name];  Service: Orthopedics;  Laterality: Left;  . penile implant    . PROSTATE SURGERY    . SHOULDER SURGERY    . TONSILLECTOMY    . TOTAL KNEE ARTHROPLASTY Left 10/19/2018   Procedure: LEFT TOTAL KNEE ARTHROPLASTY;  Surgeon: LVickey Huger MD;  Location: Cory [name];  Service: Orthopedics;  Laterality: Left;  with abductor block failed spinal  . TRANSTHORACIC ECHOCARDIOGRAM  03/2010   EF=>55%; LV mildly dilated; LA mod dilated; mild MR & TR; normal RVSP; mild pulm valve regurg      Chief Complaint  Patient presents with  . Rectal Bleeding      HPI:    JRiyaz [name] is a 74y.o. male non-smoker with past medical history relevant for CAD, Barrett's esophagus, history of ischemic cardiomyopathy anxiety and depression who presents to the ED with concerns of bright red blood per rectum followed by melena -Patient complains of fatigue, progressive weakness, and some dyspnea on exertion no dizziness no syncope -Hemoglobin is down to 9.8 apparently it was 12.2 at outside facility less than 2 weeks ago -No hematemesis Last EGD was in August 2017 revealing erosive reflux esophagitis and short segment Barrett's without dysplasia  and small sliding hiatal hernia. His last colonoscopy was in February 2013 revealing pancolonic diverticulosis.  ,No fever  Or chills   No Nausea, Vomiting or Diarrhea -No chest pains -Creatinine in ED is 0.94 -INR is 1.1 -WBC 4.4 and platelets 195   Review of systems:    In addition to the HPI above,   A full Review of  Systems was done, all other systems reviewed are negative except as noted above in HPI , .    Social History:  Reviewed by me    Social History   Tobacco Use  . Smoking status: Never Smoker  . Smokeless tobacco: Never Used  Substance Use Topics  . Alcohol use: No       Family History :  Reviewed by me  Family History  Problem Relation Age of Onset  . Colitis Maternal Aunt      Home Medications:   Prior to Admission medications   Medication Sig Start Date End Date Taking? Authorizing Provider  acetaminophen (TYLENOL) 325 MG tablet Take 650 mg by mouth daily as needed for moderate pain or headache.   Yes [provider]  aspirin EC 325 MG EC tablet Take 1 tablet (325 mg total) by mouth 2 (two) times daily. Patient taking differently: Take 81 mg by mouth at bedtime.  10/20/18  Yes Donia Ast, PA  buPROPion [facility] SR) 100 MG 12 hr tablet Take 100 mg by mouth daily.   Yes [provider]  fluticasone (FLONASE) 50 MCG/ACT nasal spray Place 1 spray into both nostrils daily as needed for allergies or rhinitis.   Yes [provider]  hydrOXYzine (ATARAX/VISTARIL) 10 MG tablet Take 10 mg by mouth at bedtime as needed. 1 tab HS PRN, 1-2 tabs PRN for anxiety.   Yes [provider]  venlafaxine (EFFEXOR) 75 MG tablet Take 75 mg by mouth daily.   Yes [provider]  atorvastatin (LIPITOR) 80 MG tablet Take 1 tablet (80 mg total) by mouth daily at 6 PM. 05/21/19   Hilty, Nadean Corwin, MD  carvedilol (COREG) 3.125 MG tablet Take 1 tablet (3.125 mg total) by mouth 2 (two) times daily. 05/21/18 04/10/20   Hilty, Nadean Corwin, MD  irbesartan (AVAPRO) 300 MG tablet Take 1 tablet (300 mg total) by mouth daily. 09/08/19   Hilty, Nadean Corwin, MD  loratadine (CLARITIN) 10 MG tablet Take 10 mg by mouth daily.     [provider]  Magnesium Malate 1250 (141.7 Mg) MG TABS Take 1,250 mg by mouth every evening.     [provider]  methocarbamol (ROBAXIN) 500 MG tablet Take 1-2 tablets (500-1,000 mg total) by mouth every 6 (six) hours as needed for muscle spasms. 10/20/18   Donia Ast, PA  oxyCODONE (OXY IR/ROXICODONE) 5 MG immediate release tablet Take 1-2 tablets (5-10 mg total) by mouth every 6 (six) hours as needed for moderate pain (pain score 4-6). 10/20/18   Donia Ast, PA  pantoprazole (PROTONIX) 40 MG tablet Take 1 tablet (40 mg total) by mouth 2 (two) times daily before a meal. 08/14/16   Rehman, Mechele Dawley, MD  traZODone (DESYREL) 100 MG tablet Take 100 mg by mouth at bedtime.     [provider]     Allergies:     Allergies  Allergen Reactions  . Ace Inhibitors Other (See Comments)    unknown  . Benadryl [Diphenhydramine Hcl] Other (See Comments)    Opposite reaction of medication purpose - hyper  . Vioxx [Rofecoxib] Other (See Comments)    headache     Physical Exam:   Vitals  Blood pressure (!) 106/58, pulse 62, temperature 98 F (36.7 C), temperature source Oral, resp. rate 10, height '5\' 10"'$  (1.778 m), weight 79.2 kg, SpO2 100 %.  Physical Examination: General appearance - alert, well appearing, and in no distress Mental status - alert, oriented to person, place, and time,  Eyes - sclera anicteric Neck - supple, no JVD elevation , Chest - clear  to auscultation bilaterally, symmetrical air movement,  Heart - S1 and S2 normal, regular  Abdomen - soft, nontender, nondistended, no masses or organomegaly Neurological - screening mental status exam normal, neck supple without rigidity, cranial nerves II through XII intact, DTR's normal and  symmetric Extremities -  no pedal edema noted, intact peripheral pulses  Skin - warm, dry     Data Review:    CBC Recent Labs  Lab 04/10/20 1143  WBC 4.4  HGB 9.8*  HCT 29.1*  PLT 195  MCV 94.2  MCH 31.7  MCHC 33.7  RDW 12.6  LYMPHSABS 1.6  MONOABS 0.4  EOSABS 0.2  BASOSABS 0.0   ------------------------------------------------------------------------------------------------------------------  Chemistries  Recent Labs  Lab 04/10/20 1143  NA 137  K 4.1  CL 104  CO2 25  GLUCOSE 115*  BUN 21  CREATININE 0.94  CALCIUM 9.0  AST 14*  ALT 16  ALKPHOS 58  BILITOT 0.7   ------------------------------------------------------------------------------------------------------------------ estimated creatinine clearance is 73.3 mL/min (by C-G formula based on SCr of 0.94 mg/dL). ------------------------------------------------------------------------------------------------------------------ No results for input(s): TSH, T4TOTAL, T3FREE, THYROIDAB in the last 72 hours.  Invalid input(s): FREET3   Coagulation profile Recent Labs  Lab 04/10/20 1143  INR 1.1   ------------------------------------------------------------------------------------------------------------------- No results for input(s): DDIMER in the last 72 hours. -------------------------------------------------------------------------------------------------------------------  Cardiac Enzymes No results for input(s): CKMB, TROPONINI, MYOGLOBIN in the last 168 hours.  Invalid input(s): CK ------------------------------------------------------------------------------------------------------------------ No results found for: BNP   ---------------------------------------------------------------------------------------------------------------  Urinalysis No results found for: COLORURINE, APPEARANCEUR, LABSPEC, [place], GLUCOSEU, HGBUR, BILIRUBINUR, KETONESUR, PROTEINUR, UROBILINOGEN, NITRITE,  LEUKOCYTESUR  ----------------------------------------------------------------------------------------------------------------   Imaging Results:    No results found.  Radiological Exams on Admission: No results found.  DVT Prophylaxis -SCD  /Gi bleed AM Labs Ordered, also please review Full Orders  Family Communication: Admission, patients condition and plan of care including tests being ordered have been discussed with the patient  who indicate understanding and agree with the plan   Code Status - Full Code  Likely DC to  Home   Condition   stable  Roxan Hockey M.D on 04/10/2020 at 7:24 PM Go to [email or web address] -  for contact info  [facility] - Office  [phone]

## 2020-04-10 NOTE — Consult Note (Signed)
Referring Provider: courage Denton Brick, MD Primary Care Physician:  Sharilyn Sites, MD Primary Gastroenterologist:  Dr. Laural Golden  Reason for Consultation:    Melena.  HPI:   Patient is 74 year old Caucasian male who presented to emergency room with 1 week history of melena and progressive weakness.  Patient's hemoglobin was 9.8 g. Patient states he has been feeling bad for 1 to 2 weeks.  He has been passing black stools for 1 week.  He has noted progressive weakness without chest pain shortness of breath or postural symptoms.  He was seen Ms. Delman Cheadle, PA-C at [place] last week and was begun on Zithromax.  He also had blood work done and his hemoglobin was 12.2. Patient's wife very was able to get prior readings from [facility] for me.  Patient's hemoglobin was 14.9 g on 03/17/2020. Patient states heartburn is well controlled with therapy.  He denies nausea vomiting dysphagia or abdominal pain.  Last bowel movement was this morning and was black.  Even though he has not had a good appetite he has not lost any weight in the last 2 weeks or so.  He is on low-dose aspirin and does not take other OTC NSAIDs. Last EGD was in August 2017 revealing erosive reflux esophagitis and short segment Barrett's without dysplasia and small sliding hiatal hernia. His last colonoscopy was in February 2013 revealing pancolonic diverticulosis.  Review of the systems is negative for cough shortness of breath chest pain fever chills or bright red blood per rectum.  Patient is retired.  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  He has never smoked cigarettes he does not drink alcohol.  Mother died of coronary artery disease in her 85s and father lived to be in his 61s.  He has 2 brothers and 2 sisters in good health.  He has 2 sons in good health.   Past Medical History:  Diagnosis Date  . Anemia   . Arthritis   . Coronary artery disease    a. 09/2001 PCI/BMS to D1: 2.5 x 15 Express 2 BMS;  b.  2011 Cath: stable anatomy; c. 03/2014 Low risk MV, EF 54%;  d. 09/2016 Inf STEMI/PCI [facility]): LM nl, LAD 66m D1 nl w/ 90% in 159minf branch, LCX nl, RCA 99 (Synergy DES x 4 - 3.5x28 prox, 3.5x3846m.5x16d, 2.25x16 to RPDA), EF 55%.  . GMarland KitchenRD (gastroesophageal reflux disease)   . Hyperlipidemia   . Hypertensive heart disease   . Myocardial infarction ([facility]  . Pneumonia   . PTSD (post-traumatic stress disorder)     Past Surgical History:  Procedure Laterality Date  . CARDIAC CATHETERIZATION  05/17/2010   left main free of significant lesion; LAD with 60% lesion in mid segment & 60-70% lesion in mid-distal segment with subtotal occlusion at apex & small side branch of diagonal 1 that is subtotally occluded; ramus intermedius with 90% ostial stenosis; L Cfx w/mild luminal irreg; RCA is dominant vessel with diffuse disease, 50% prox lesion & 60% lesion in mid segment & 60% lesion in PDA  . CATARACT EXTRACTION W/PHACO Right 06/07/2015   Procedure: CATARACT EXTRACTION PHACO AND INTRAOCULAR LENS PLACEMENT (IOC);  Surgeon: RoyMarylynn PearsonD;  [facility];  Laterality: Right;  . COLONOSCOPY  02/25/2012   Procedure: COLONOSCOPY;  Surgeon: MarJamesetta SoD;  [facility];  Service: Gastroenterology;  Laterality: N/A;  . CORONARY ANGIOPLASTY WITH STENT PLACEMENT  09/2001   Taxus (2.5x15m39mtent  to 1st diagonal   . ESOPHAGEAL DILATION N/A 08/14/2016   Procedure: ESOPHAGEAL DILATION;  Surgeon: Rogene Houston, MD;  [facility];  Service: Endoscopy;  Laterality: N/A;  . ESOPHAGOGASTRODUODENOSCOPY  02/25/2012   Procedure: ESOPHAGOGASTRODUODENOSCOPY (EGD);  Surgeon: Jamesetta So, MD;  [facility];  Service: Gastroenterology;  Laterality: N/A;  . ESOPHAGOGASTRODUODENOSCOPY N/A 07/28/2014   Procedure: ESOPHAGOGASTRODUODENOSCOPY (EGD);  Surgeon: Rogene Houston, MD;  [facility];  Service: Endoscopy;  Laterality: N/A;   200-rescheduled to [place] notified pt  . ESOPHAGOGASTRODUODENOSCOPY N/A 08/14/2016   Procedure: ESOPHAGOGASTRODUODENOSCOPY (EGD);  Surgeon: Rogene Houston, MD;  [facility];  Service: Endoscopy;  Laterality: N/A;  100  . EYE SURGERY Bilateral    lasik surgery   . KNEE ARTHROSCOPY WITH MEDIAL MENISECTOMY Left 02/05/2018   Procedure: LEFT KNEE ARTHROSCOPY WITH PARTIAL MEDIAL MENISECTOMY;  Surgeon: Carole Civil, MD;  Location: [facility];  Service: Orthopedics;  Laterality: Left;  . KNEE SURGERY    . LATERAL EPICONDYLE RELEASE Left 01/21/2014   Procedure: Left Tennis Elbow Release with debridement  tendon repair with reattachment;  Surgeon: Carole Civil, MD;  Location: [facility];  Service: Orthopedics;  Laterality: Left;  . penile implant    . PROSTATE SURGERY    . SHOULDER SURGERY    . TONSILLECTOMY    . TOTAL KNEE ARTHROPLASTY Left 10/19/2018   Procedure: LEFT TOTAL KNEE ARTHROPLASTY;  Surgeon: Vickey Huger, MD;  Location: [facility];  Service: Orthopedics;  Laterality: Left;  with abductor block failed spinal  . TRANSTHORACIC ECHOCARDIOGRAM  03/2010   EF=>55%; LV mildly dilated; LA mod dilated; mild MR & TR; normal RVSP; mild pulm valve regurg    Prior to Admission medications   Medication Sig Start Date End Date Taking? Authorizing Provider  acetaminophen (TYLENOL) 325 MG tablet Take 650 mg by mouth daily as needed for moderate pain or headache.   Yes [provider]  aspirin EC 325 MG EC tablet Take 1 tablet (325 mg total) by mouth 2 (two) times daily. Patient taking differently: Take 81 mg by mouth at bedtime.  10/20/18  Yes Donia Ast, PA  buPROPion [facility] SR) 100 MG 12 hr tablet Take 100 mg by mouth daily.   Yes [provider]  fluticasone (FLONASE) 50 MCG/ACT nasal spray Place 1 spray into both nostrils daily as needed for allergies or rhinitis.   Yes [provider]  hydrOXYzine (ATARAX/VISTARIL) 10 MG tablet Take 10 mg by mouth  at bedtime as needed. 1 tab HS PRN, 1-2 tabs PRN for anxiety.   Yes [provider]  venlafaxine (EFFEXOR) 75 MG tablet Take 75 mg by mouth daily.   Yes [provider]  atorvastatin (LIPITOR) 80 MG tablet Take 1 tablet (80 mg total) by mouth daily at 6 PM. 05/21/19   Hilty, Nadean Corwin, MD  carvedilol (COREG) 3.125 MG tablet Take 1 tablet (3.125 mg total) by mouth 2 (two) times daily. 05/21/18 04/10/20  Hilty, Nadean Corwin, MD  irbesartan (AVAPRO) 300 MG tablet Take 1 tablet (300 mg total) by mouth daily. 09/08/19   Hilty, Nadean Corwin, MD  loratadine (CLARITIN) 10 MG tablet Take 10 mg by mouth daily.     [provider]  Magnesium Malate 1250 (141.7 Mg) MG TABS Take 1,250 mg by mouth every evening.     [provider]  methocarbamol (ROBAXIN) 500 MG tablet Take 1-2 tablets (500-1,000 mg total) by mouth every 6 (  six) hours as needed for muscle spasms. 10/20/18   Donia Ast, PA  oxyCODONE (OXY IR/ROXICODONE) 5 MG immediate release tablet Take 1-2 tablets (5-10 mg total) by mouth every 6 (six) hours as needed for moderate pain (pain score 4-6). 10/20/18   Donia Ast, PA  pantoprazole (PROTONIX) 40 MG tablet Take 1 tablet (40 mg total) by mouth 2 (two) times daily before a meal. 08/14/16   Dashanti Burr, Mechele Dawley, MD  traZODone (DESYREL) 100 MG tablet Take 100 mg by mouth at bedtime.     [provider]    Current Facility-Administered Medications  Medication Dose Route Frequency Provider Last Rate Last Admin  . pantoprazole (PROTONIX) 80 mg in sodium chloride 0.9 % 100 mL (0.8 mg/mL) infusion  8 mg/hr Intravenous Continuous Davonna Belling, MD 10 mL/hr at 04/10/20 1435 8 mg/hr at 04/10/20 1435   Current Outpatient Medications  Medication Sig Dispense Refill  . acetaminophen (TYLENOL) 325 MG tablet Take 650 mg by mouth daily as needed for moderate pain or headache.    Marland Kitchen aspirin EC 325 MG EC tablet Take 1 tablet (325 mg total) by mouth 2 (two) times daily.  (Patient taking differently: Take 81 mg by mouth at bedtime. ) 30 tablet 0  . buPROPion (WELLBUTRIN SR) 100 MG 12 hr tablet Take 100 mg by mouth daily.    . fluticasone (FLONASE) 50 MCG/ACT nasal spray Place 1 spray into both nostrils daily as needed for allergies or rhinitis.    . hydrOXYzine (ATARAX/VISTARIL) 10 MG tablet Take 10 mg by mouth at bedtime as needed. 1 tab HS PRN, 1-2 tabs PRN for anxiety.    Marland Kitchen venlafaxine (EFFEXOR) 75 MG tablet Take 75 mg by mouth daily.    Marland Kitchen atorvastatin (LIPITOR) 80 MG tablet Take 1 tablet (80 mg total) by mouth daily at 6 PM. 90 tablet 3  . carvedilol (COREG) 3.125 MG tablet Take 1 tablet (3.125 mg total) by mouth 2 (two) times daily. 180 tablet 3  . irbesartan (AVAPRO) 300 MG tablet Take 1 tablet (300 mg total) by mouth daily. 90 tablet 2  . loratadine (CLARITIN) 10 MG tablet Take 10 mg by mouth daily.     . Magnesium Malate 1250 (141.7 Mg) MG TABS Take 1,250 mg by mouth every evening.     . methocarbamol (ROBAXIN) 500 MG tablet Take 1-2 tablets (500-1,000 mg total) by mouth every 6 (six) hours as needed for muscle spasms. 60 tablet 0  . oxyCODONE (OXY IR/ROXICODONE) 5 MG immediate release tablet Take 1-2 tablets (5-10 mg total) by mouth every 6 (six) hours as needed for moderate pain (pain score 4-6). 50 tablet 0  . pantoprazole (PROTONIX) 40 MG tablet Take 1 tablet (40 mg total) by mouth 2 (two) times daily before a meal. 180 tablet 1  . traZODone (DESYREL) 100 MG tablet Take 100 mg by mouth at bedtime.       Allergies as of 04/10/2020 - Review Complete 04/10/2020  Allergen Reaction Noted  . Ace inhibitors Other (See Comments) 07/28/2014  . Benadryl [diphenhydramine hcl] Other (See Comments) 01/07/2012  . Vioxx [rofecoxib] Other (See Comments) 11/10/2011    Family History  Problem Relation Age of Onset  . Colitis Maternal Aunt     Social History   Socioeconomic History  . Marital status: Married    Spouse name: Not on file  . Number of children:  Not on file  . Years of education: Not on file  . Highest education level: Not  on file  Occupational History  . Not on file  Tobacco Use  . Smoking status: Never Smoker  . Smokeless tobacco: Never Used  Substance and Sexual Activity  . Alcohol use: No  . Drug use: No  . Sexual activity: Not on file  Other Topics Concern  . Not on file  Social History Narrative  . Not on file   Social Determinants of Health   Financial Resource Strain:   . Difficulty of Paying Living Expenses:   Food Insecurity:   . Worried About Charity fundraiser in the Last Year:   . Arboriculturist in the Last Year:   Transportation Needs:   . Film/video editor (Medical):   Marland Kitchen Lack of Transportation (Non-Medical):   Physical Activity:   . Days of Exercise per Week:   . Minutes of Exercise per Session:   Stress:   . Feeling of Stress :   Social Connections:   . Frequency of Communication with Friends and Family:   . Frequency of Social Gatherings with Friends and Family:   . Attends Religious Services:   . Active Member of Clubs or Organizations:   . Attends Archivist Meetings:   Marland Kitchen Marital Status:   Intimate Partner Violence:   . Fear of Current or Ex-Partner:   . Emotionally Abused:   Marland Kitchen Physically Abused:   . Sexually Abused:     Review of Systems: See HPI, otherwise normal ROS  Physical Exam: Temp:  [98.3 F (36.8 C)] 98.3 F (36.8 C) (04/12 1130) Pulse Rate:  [92] 92 (04/12 1130) Resp:  [20] 20 (04/12 1130) BP: (151)/(74) 151/74 (04/12 1130) SpO2:  [100 %] 100 % (04/12 1130)   Patient was seen in emergency room. Patient is alert and in no acute distress. He appears pale. Conjunctivae is also pale and sclerae nonicteric. Oropharyngeal mucosa is normal. Dentition in satisfactory condition. Neck without mass masses thyromegaly or lymphadenopathy. Cardiac exam with regular rhythm normal S1 and S2.  Faint early systolic murmur noted at aortic area. Auscultation lungs  reveal vesicular breath sounds bilaterally. Abdomen is symmetrical.  Bowel sounds are normal.  On palpation abdomen is soft and nontender with organomegaly or masses. He does not have clubbing or koilonychia. No peripheral edema noted.  Lab Results: Recent Labs    04/10/20 1143  WBC 4.4  HGB 9.8*  HCT 29.1*  PLT 195   BMET Recent Labs    04/10/20 1143  NA 137  K 4.1  CL 104  CO2 25  GLUCOSE 115*  BUN 21  CREATININE 0.94  CALCIUM 9.0   LFT Recent Labs    04/10/20 1143  PROT 6.2*  ALBUMIN 3.8  AST 14*  ALT 16  ALKPHOS 58  BILITOT 0.7   PT/INR Recent Labs    04/10/20 1143  LABPROT 13.6  INR 1.1    Assessment;  Patient is 74 year old Caucasian male who has history of erosive reflux esophagitis short segment Barrett's esophagus who is maintained on double dose PPI with satisfactory control of his heartburn who presents with 1 week history of melena and his hemoglobin has dropped from 14.9 about 3 weeks ago to 9.8 g today.  Patient is on low-dose aspirin and does not take other OTC NSAIDs.  Last surveillance EGD was in August 2017. Need to rule out GI bleeding source from upper GI tract before considering source and small or large bowel.  As noted above he has colonic diverticulosis based on colonoscopy  of February 2013. Patient is hemodynamically stable. He is on pantoprazole infusion.  Recommendations;  Esophagogastroduodenoscopy with therapeutic intention. I have reviewed the procedure risks with the patient and his wife and they are agreeable. Procedure to be performed this afternoon.   LOS: 0 days   Julian Askin  04/10/2020, 3:06 PM

## 2020-04-10 NOTE — Op Note (Signed)
[facility] Patient Name: Cory [name] Procedure Date: 04/10/2020 5:23 PM MRN: [id number] Date of Birth: 1946/03/15 Attending MD: Hildred Laser , MD CSN: [id number] Age: 74 Admit Type: Inpatient Procedure:                Upper GI endoscopy Indications:              Melena Providers:                Hildred Laser, MD, Lurline Del, RN, Nelma Rothman,                            Technician Referring MD:             Roxan Hockey, MD Medicines:                Lidocaine spray, Meperidine 50 mg IV, Midazolam 5                            mg IV Complications:            No immediate complications. Estimated Blood Loss:     Estimated blood loss was minimal. Procedure:                Pre-Anesthesia Assessment:                           - Prior to the procedure, a History and Physical                            was performed, and patient medications and                            allergies were reviewed. The patient's tolerance of                            previous anesthesia was also reviewed. The risks                            and benefits of the procedure and the sedation                            options and risks were discussed with the patient.                            All questions were answered, and informed consent                            was obtained. Prior Anticoagulants: The patient has                            taken no previous anticoagulant or antiplatelet                            agents except for aspirin. ASA Grade Assessment:  III - A patient with severe systemic disease. After                            reviewing the risks and benefits, the patient was                            deemed in satisfactory condition to undergo the                            procedure.                           After obtaining informed consent, the endoscope was                            passed under direct vision. Throughout the   procedure, the patient's blood pressure, pulse, and                            oxygen saturations were monitored continuously. The                            [id number]) was introduced through the                            mouth, and advanced to the third part of duodenum.                            The upper GI endoscopy was accomplished without                            difficulty. The patient tolerated the procedure                            well. Scope In: 5:34:49 PM Scope Out: 5:44:35 PM Total Procedure Duration: 0 hours 9 minutes 46 seconds  Findings:      The hypopharynx was normal.      There were esophageal mucosal changes secondary to established       short-segment Barrett's disease present in the distal esophagus. The       maximum longitudinal extent of these mucosal changes was 1 cm in length.       Mucosa was biopsied with a cold forceps for histology randomly. One       specimen bottle was sent to pathology.      The Z-line was regular and was found 39 cm from the incisors.      A 2 cm hiatal hernia was present.      The entire examined stomach was normal.      The duodenal bulb, second portion of the duodenum and third portion of       the duodenum were normal. Impression:               - Normal hypopharynx.                           - Esophageal mucosal changes secondary to  established short-segment Barrett's disease.                            Biopsied.                           - Z-line regular, 39 cm from the incisors.                           - 2 cm hiatal hernia.                           - Normal stomach.                           - Normal duodenal bulb, second portion of the                            duodenum and third portion of the duodenum.                           comment: healed esophagitis since EGD of August,                            2017.                           no bleeding source identified. Moderate  Sedation:      Moderate (conscious) sedation was administered by the endoscopy nurse       and supervised by the endoscopist. The following parameters were       monitored: oxygen saturation, heart rate, blood pressure, CO2       capnography and response to care. Total physician intraservice time was       15 minutes. Recommendation:           - Return patient to hospital ward for ongoing care.                           - Clear liquid diet today.                           - Perform a colonoscopy tomorrow.                           - Change PPI to IV q 12 hours when infusion                            complted. Procedure Code(s):        --- Professional ---                           [phone], Esophagogastroduodenoscopy, flexible,                            transoral; with biopsy, single or multiple  G0500, Moderate sedation services provided by the                            same physician or other qualified health care                            professional performing a gastrointestinal                            endoscopic service that sedation supports,                            requiring the presence of an independent trained                            observer to assist in the monitoring of the                            patient's level of consciousness and physiological                            status; initial 15 minutes of intra-service time;                            patient age 58 years or older (additional time may                            be reported with [phone], as appropriate) Diagnosis Code(s):        --- Professional ---                           K22.70, Barrett's esophagus without dysplasia                           K44.9, Diaphragmatic hernia without obstruction or                            gangrene                           K92.1, Melena (includes Hematochezia) CPT copyright 2019 American Medical Association. All rights reserved. The codes  documented in this report are preliminary and upon coder review may  be revised to meet current compliance requirements. Hildred Laser, MD Hildred Laser, MD 04/10/2020 6:00:51 PM This report has been signed electronically. Number of Addenda: 0

## 2020-04-10 NOTE — Progress Notes (Signed)
Brief EGD note.  Healed esophagitis. Short segment Barrett's esophagus.  Biopsy taken. Small sliding hiatal hernia. Normal examination of stomach first second and third part of duodenum.

## 2020-04-11 ENCOUNTER — Encounter (HOSPITAL_COMMUNITY): Payer: Self-pay | Admitting: Family Medicine

## 2020-04-11 ENCOUNTER — Encounter (HOSPITAL_COMMUNITY)
Admission: EM | Disposition: A | Discharge status: Home / Self Care | Payer: Self-pay | Source: Home / Self Care | Attending: Family Medicine

## 2020-04-11 DIAGNOSIS — I255 Ischemic cardiomyopathy: Secondary | ICD-10-CM | POA: Diagnosis present

## 2020-04-11 DIAGNOSIS — Z79899 Other long term (current) drug therapy: Secondary | ICD-10-CM | POA: Diagnosis not present

## 2020-04-11 DIAGNOSIS — K449 Diaphragmatic hernia without obstruction or gangrene: Secondary | ICD-10-CM | POA: Diagnosis present

## 2020-04-11 DIAGNOSIS — I10 Essential (primary) hypertension: Secondary | ICD-10-CM | POA: Diagnosis not present

## 2020-04-11 DIAGNOSIS — I110 Hypertensive heart disease with heart failure: Secondary | ICD-10-CM | POA: Diagnosis present

## 2020-04-11 DIAGNOSIS — K2270 Barrett's esophagus without dysplasia: Secondary | ICD-10-CM | POA: Diagnosis present

## 2020-04-11 DIAGNOSIS — F4310 Post-traumatic stress disorder, unspecified: Secondary | ICD-10-CM | POA: Diagnosis present

## 2020-04-11 DIAGNOSIS — I5042 Chronic combined systolic (congestive) and diastolic (congestive) heart failure: Secondary | ICD-10-CM | POA: Diagnosis present

## 2020-04-11 DIAGNOSIS — Z8379 Family history of other diseases of the digestive system: Secondary | ICD-10-CM | POA: Diagnosis not present

## 2020-04-11 DIAGNOSIS — Z7982 Long term (current) use of aspirin: Secondary | ICD-10-CM | POA: Diagnosis not present

## 2020-04-11 DIAGNOSIS — Z888 Allergy status to other drugs, medicaments and biological substances status: Secondary | ICD-10-CM | POA: Diagnosis not present

## 2020-04-11 DIAGNOSIS — K922 Gastrointestinal hemorrhage, unspecified: Secondary | ICD-10-CM | POA: Diagnosis present

## 2020-04-11 DIAGNOSIS — K5731 Diverticulosis of large intestine without perforation or abscess with bleeding: Secondary | ICD-10-CM | POA: Diagnosis present

## 2020-04-11 DIAGNOSIS — I252 Old myocardial infarction: Secondary | ICD-10-CM | POA: Diagnosis not present

## 2020-04-11 DIAGNOSIS — K921 Melena: Secondary | ICD-10-CM | POA: Diagnosis not present

## 2020-04-11 DIAGNOSIS — F329 Major depressive disorder, single episode, unspecified: Secondary | ICD-10-CM | POA: Diagnosis present

## 2020-04-11 DIAGNOSIS — Z955 Presence of coronary angioplasty implant and graft: Secondary | ICD-10-CM | POA: Diagnosis not present

## 2020-04-11 DIAGNOSIS — K5710 Diverticulosis of small intestine without perforation or abscess without bleeding: Secondary | ICD-10-CM | POA: Diagnosis present

## 2020-04-11 DIAGNOSIS — M1990 Unspecified osteoarthritis, unspecified site: Secondary | ICD-10-CM | POA: Diagnosis present

## 2020-04-11 DIAGNOSIS — Z20822 Contact with and (suspected) exposure to covid-19: Secondary | ICD-10-CM | POA: Diagnosis present

## 2020-04-11 DIAGNOSIS — Z9889 Other specified postprocedural states: Secondary | ICD-10-CM | POA: Diagnosis not present

## 2020-04-11 DIAGNOSIS — K5730 Diverticulosis of large intestine without perforation or abscess without bleeding: Secondary | ICD-10-CM | POA: Diagnosis not present

## 2020-04-11 DIAGNOSIS — I2510 Atherosclerotic heart disease of native coronary artery without angina pectoris: Secondary | ICD-10-CM | POA: Diagnosis present

## 2020-04-11 DIAGNOSIS — E785 Hyperlipidemia, unspecified: Secondary | ICD-10-CM | POA: Diagnosis present

## 2020-04-11 DIAGNOSIS — D62 Acute posthemorrhagic anemia: Secondary | ICD-10-CM | POA: Diagnosis present

## 2020-04-11 DIAGNOSIS — Z96652 Presence of left artificial knee joint: Secondary | ICD-10-CM | POA: Diagnosis present

## 2020-04-11 HISTORY — PX: COLONOSCOPY: SHX5424

## 2020-04-11 LAB — CBC
HCT: 25.3 % — ABNORMAL LOW (ref 39.0–52.0)
Hemoglobin: 8.4 g/dL — ABNORMAL LOW (ref 13.0–17.0)
MCH: 30.8 pg (ref 26.0–34.0)
MCHC: 33.2 g/dL (ref 30.0–36.0)
MCV: 92.7 fL (ref 80.0–100.0)
Platelets: 191 10*3/uL (ref 150–400)
RBC: 2.73 MIL/uL — ABNORMAL LOW (ref 4.22–5.81)
RDW: 12.5 % (ref 11.5–15.5)
WBC: 4.4 10*3/uL (ref 4.0–10.5)
nRBC: 0 % (ref 0.0–0.2)

## 2020-04-11 LAB — BASIC METABOLIC PANEL
Anion gap: 6 (ref 5–15)
BUN: 14 mg/dL (ref 8–23)
CO2: 26 mmol/L (ref 22–32)
Calcium: 8.3 mg/dL — ABNORMAL LOW (ref 8.9–10.3)
Chloride: 107 mmol/L (ref 98–111)
Creatinine, Ser: 0.83 mg/dL (ref 0.61–1.24)
GFR calc Af Amer: >60 mL/min (ref >60)
GFR calc non Af Amer: >60 mL/min (ref >60)
Glucose, Bld: 92 mg/dL (ref 70–99)
Potassium: 3.9 mmol/L (ref 3.5–5.1)
Sodium: 139 mmol/L (ref 135–145)

## 2020-04-11 LAB — HEMOGLOBIN AND HEMATOCRIT, BLOOD
HCT: 27.7 % — ABNORMAL LOW (ref 39.0–52.0)
Hemoglobin: 9 g/dL — ABNORMAL LOW (ref 13.0–17.0)

## 2020-04-11 SURGERY — COLONOSCOPY
Anesthesia: Moderate Sedation

## 2020-04-11 MED ORDER — PEG 3350-KCL-NA BICARB-NACL 420 G PO SOLR
4000.0000 mL | Freq: Once | ORAL | Status: AC
  Administered 2020-04-11: 4000 mL via ORAL

## 2020-04-11 MED ORDER — STERILE WATER FOR IRRIGATION IR SOLN
Status: DC | PRN
  Administered 2020-04-11: 100 mL

## 2020-04-11 MED ORDER — MEPERIDINE HCL 50 MG/ML IJ SOLN
INTRAMUSCULAR | Status: DC | PRN
  Administered 2020-04-11: 25 mg via INTRAVENOUS

## 2020-04-11 MED ORDER — SODIUM CHLORIDE 0.9 % IV SOLN: INTRAVENOUS | Status: DC

## 2020-04-11 MED ORDER — MEPERIDINE HCL 50 MG/ML IJ SOLN
INTRAMUSCULAR | Status: AC
  Filled 2020-04-11: qty 1

## 2020-04-11 MED ORDER — MIDAZOLAM HCL 5 MG/5ML IJ SOLN
INTRAMUSCULAR | Status: DC | PRN
  Administered 2020-04-11: 1 mg via INTRAVENOUS
  Administered 2020-04-11: 2 mg via INTRAVENOUS
  Administered 2020-04-11 (×2): 1 mg via INTRAVENOUS
  Administered 2020-04-11: 2 mg via INTRAVENOUS

## 2020-04-11 MED ORDER — MIDAZOLAM HCL 5 MG/5ML IJ SOLN
INTRAMUSCULAR | Status: AC
  Filled 2020-04-11: qty 10

## 2020-04-11 NOTE — Progress Notes (Signed)
MEWS is yellow, BP-81/48, HR-57. patient is a/ox4, calm. denies dizziness or feeling weak.  BP Rechecked- 97/61 MAP 73 HR-73

## 2020-04-11 NOTE — Progress Notes (Signed)
  Subjective:  Patient has no complaints.  He states he was able to get some sleep between nursing visits.  He denies chest pain shortness of breath or abdominal pain.  He says he does not feel bad.  He is hungry.  Objective: Blood pressure 122/63, pulse 64, temperature 97.8 F (36.6 C), temperature source Oral, resp. rate 18, height '5\' 10"'$  (1.778 m), weight 79.2 kg, SpO2 97 %. Patient is alert and in no acute distress. He appears pale. Oropharyngeal mucosa is normal. No neck masses or thyromegaly noted. Cardiac exam with regular rhythm normal S1 and S2. No murmur or gallop noted. Lungs are clear to auscultation. Abdomen is symmetrical.  Bowel sounds are normal.  On palpation abdomen is soft and nontender with organomegaly or masses. No LE edema or clubbing noted.  Labs/studies Results:  CBC Latest Ref Rng & Units 04/11/2020 04/10/2020 04/10/2020  WBC 4.0 - 10.5 K/uL 4.4 4.3 4.4  Hemoglobin 13.0 - 17.0 g/dL 8.4(L) 9.6(L) 9.8(L)  Hematocrit 39.0 - 52.0 % 25.3(L) 29.1(L) 29.1(L)  Platelets 150 - 400 K/uL 191 191 195    CMP Latest Ref Rng & Units 04/11/2020 04/10/2020 10/20/2018  Glucose 70 - 99 mg/dL 92 115(H) 145(H)  BUN 8 - 23 mg/dL '14 21 14  '$ Creatinine 0.61 - 1.24 mg/dL 0.83 0.94 0.86  Sodium 135 - 145 mmol/L 139 137 138  Potassium 3.5 - 5.1 mmol/L 3.9 4.1 4.6  Chloride 98 - 111 mmol/L 107 104 105  CO2 22 - 32 mmol/L '26 25 27  '$ Calcium 8.9 - 10.3 mg/dL 8.3(L) 9.0 8.7(L)  Total Protein 6.5 - 8.1 g/dL - 6.2(L) -  Total Bilirubin 0.3 - 1.2 mg/dL - 0.7 -  Alkaline Phos 38 - 126 U/L - 58 -  AST 15 - 41 U/L - 14(L) -  ALT 0 - 44 U/L - 16 -    Hepatic Function Latest Ref Rng & Units 04/10/2020 10/09/2018 01/29/2018  Total Protein 6.5 - 8.1 g/dL 6.2(L) 7.4 6.9  Albumin 3.5 - 5.0 g/dL 3.8 4.5 4.3  AST 15 - 41 U/L 14(L) 21 23  ALT 0 - 44 U/L '16 22 24  '$ Alk Phosphatase 38 - 126 U/L 58 79 66  Total Bilirubin 0.3 - 1.2 mg/dL 0.7 1.2 0.7  Bilirubin, Direct <=0.2 mg/dL - - -      Assessment:  #1.  GI bleed.  Patient presented with melena of 1 week duration.  He underwent esophagogastroduodenoscopy and no bleeding lesion was identified.  His last colonoscopy was in 2013.  He could be bleeding from right-sided colonic lesion or from small bowel.  Patient does not appear to be bleeding actively.  Aspirin is on hold.  #2.  Anemia secondary to GI bleed.  Hemoglobin 3-1/2 weeks ago was 14.9.  It was 9.6 on admission and 8.4 this morning.  We will continue to monitor H&H.  No indication for transfusion at this time.  #3.  Chronic GERD complicated by short segment Barrett's esophagus.  Biopsy was taken yesterday to document that he does not have dysplasia.  Last biopsy was in August 2017. Patient will be transitioned to oral PPI as soon as colonoscopy is completed.   Plan:  GoLYTELY prep this morning. Colonoscopy this afternoon. H&H this afternoon.

## 2020-04-11 NOTE — Plan of Care (Signed)

## 2020-04-11 NOTE — Op Note (Signed)
[facility] Patient Name: Cory [name] Procedure Date: 04/11/2020 3:12 PM MRN: [id number] Date of Birth: Aug 18, 1946 Attending MD: Hildred Laser , MD CSN: [id number] Age: 74 Admit Type: Inpatient Procedure:                Colonoscopy Indications:              Gastrointestinal bleeding; patient presented with                            melena but no lesion noted in UGI tract to account                            for blood loss. Providers:                Hildred Laser, MD, Rosina Lowenstein, RN, Randa Spike, Technician Referring MD:             Roxan Hockey, MD Medicines:                Meperidine 25 mg IV, Midazolam 7 mg IV Complications:            No immediate complications. Estimated Blood Loss:     Estimated blood loss: none. Procedure:                Pre-Anesthesia Assessment:                           - Prior to the procedure, a History and Physical                            was performed, and patient medications and                            allergies were reviewed. The patient's tolerance of                            previous anesthesia was also reviewed. The risks                            and benefits of the procedure and the sedation                            options and risks were discussed with the patient.                            All questions were answered, and informed consent                            was obtained. Prior Anticoagulants: The patient has                            taken no previous anticoagulant or antiplatelet  agents except for aspirin. ASA Grade Assessment:                            III - A patient with severe systemic disease. After                            reviewing the risks and benefits, the patient was                            deemed in satisfactory condition to undergo the                            procedure.                           After obtaining informed consent,  the colonoscope                            was passed under direct vision. Throughout the                            procedure, the patient's blood pressure, pulse, and                            oxygen saturations were monitored continuously. The                            PCF-H190DL [id number] scope was introduced through                            the anus and advanced to the the cecum, identified                            by appendiceal orifice and ileocecal valve. The                            colonoscopy was technically difficult and complex                            due to a redundant colon. Successful completion of                            the procedure was aided by increasing the dose of                            sedation medication, using manual pressure,                            withdrawing and reinserting the scope and scope                            guide. The patient tolerated the procedure well.  The quality of the bowel preparation was marginal. Scope In: 3:58:07 PM Scope Out: 4:31:58 PM Scope Withdrawal Time: 0 hours 7 minutes 1 second  Total Procedure Duration: 0 hours 33 minutes 51 seconds  Findings:      The perianal and digital rectal examinations were normal.      Multiple small and large-mouthed diverticula were found in the sigmoid       colon and descending colon.      The exam was otherwise normal throughout the examined colon.      The retroflexed view of the distal rectum and anal verge was normal and       showed no anal or rectal abnormalities. Impression:               - Diverticulosis in the sigmoid colon and in the                            descending colon.                           - No specimens collected. Moderate Sedation:      Moderate (conscious) sedation was administered by the endoscopy nurse       and supervised by the endoscopist. The following parameters were       monitored: oxygen saturation, heart rate,  blood pressure, CO2       capnography and response to care. Total physician intraservice time was       40 minutes. Recommendation:           - Return patient to hospital ward for ongoing care.                           - Clear liquid diet today.                           - Continue present medications.                           - To visualize the small bowel, perform video                            capsule endoscopy tomorrow. Procedure Code(s):        --- Professional ---                           [phone], Colonoscopy, flexible; diagnostic, including                            collection of specimen(s) by brushing or washing,                            when performed (separate procedure)                           99153, Moderate sedation; each additional 15                            minutes intraservice time  [id number], Moderate sedation; each additional 15                            minutes intraservice time                           G0500, Moderate sedation services provided by the                            same physician or other qualified health care                            professional performing a gastrointestinal                            endoscopic service that sedation supports,                            requiring the presence of an independent trained                            observer to assist in the monitoring of the                            patient's level of consciousness and physiological                            status; initial 15 minutes of intra-service time;                            patient age 64 years or older (additional time may                            be reported with [phone], as appropriate) Diagnosis Code(s):        --- Professional ---                           K92.2, Gastrointestinal hemorrhage, unspecified                           K57.30, Diverticulosis of large intestine without                            perforation or  abscess without bleeding CPT copyright 2019 [employer]. All rights reserved. The codes documented in this report are preliminary and upon coder review may  be revised to meet current compliance requirements. Hildred Laser, MD Hildred Laser, MD 04/11/2020 4:40:39 PM This report has been signed electronically. Number of Addenda: 0

## 2020-04-11 NOTE — Progress Notes (Addendum)
Patient Demographics:    Cory [name], is a 74 y.o. male, DOB - 1946/03/08, [id number]  Admit date - 04/10/2020   Admitting Physician Nickey Canedo Denton Brick, MD  Outpatient Primary MD for the patient is Sharilyn Sites, MD  LOS - 0   Chief Complaint  Patient presents with  . Rectal Bleeding        Subjective:    Cory [name] today has no fevers, no emesis,  No chest pain,   Tolerated colonoscopy prep okay -Denies abdominal pain or further emesis -Patient with dyspnea on exertion, patient with fatigue, hemoglobin dropping further  Assessment  & Plan :    Principal Problem:   GI bleed Active Problems:   CAD in native artery   Essential hypertension   NSTEMI (non-ST elevated myocardial infarction) ([place])   Ischemic cardiomyopathy   1) acute GI bleed-- -patient had BRBPR as well as melena EGD was in August 2017 revealing erosive reflux esophagitis and short segment Barrett's without dysplasia and small sliding hiatal hernia. -IV Protonix as ordered -Repeat EGD on 04/10/2020 with short segment Barrett's esophagus, no evidence of bleeding His last colonoscopy was in February 2013 revealing pancolonic diverticulosis. -colonoscopy on 04/11/2020 without definite etiology for bleeding -Plan is for capsule endoscopy later today/tomorrow  2) acute on chronic symptomatic anemia-- -Hemoglobin is down to 8.4 from 9.8 on admission ,, it was 12.2 at outside facility within the last couple weeks -Patient with weakness, fatigue, dyspnea on exertion--Patient with dyspnea on exertion, patient with fatigue, hemoglobin dropping further -no dizziness or  palpitations or shortness of breath at rest -EGD and colonoscopy as above -Monitor H&H and transfuse as clinically indicated -Capsule endoscopy as above  3)HFrEF/ischemic cardiomyopathy --- patient with combined systolic and diastolic dysfunction CHF with EF of  40 to 45% based on echo from 08/19/2019--- --currently appears euvolemic and clinically no CHF flareup -Continue Avapro and Coreg  4)h/o CAD--stable, no chest pain, no ACS type symptoms, continue Coreg, hold aspirin due to GI bleed, Lipitor 80 mg daily  5) anxiety and depression--stable, continue Wellbutrin 100 mg daily, hydroxyzine as needed and trazodone nightly  Disposition/Need for in-Hospital Stay- patient unable to be discharged at this time due to symptomatic anemia requiring further endoluminal evaluation -Patient From: home D/C Place: home Barriers: Not Clinically Stable- -Patient with dyspnea on exertion, patient with fatigue, hemoglobin dropping further -anticipate discharge home on 04/12/2020 if H&H stable and depending on finding of capsule endoscopy  Code Status : full   Family Communication:   NA (patient is alert, awake and coherent)  Consults  :  Gi   DVT Prophylaxis  :   - SCDs   Lab Results  Component Value Date   PLT 191 04/11/2020    Inpatient Medications  Scheduled Meds: . atorvastatin  80 mg Oral q1800  . buPROPion  100 mg Oral Daily  . carvedilol  3.125 mg Oral BID  . irbesartan  150 mg Oral Daily  . loratadine  10 mg Oral Daily  . meperidine      . midazolam      . sodium chloride flush  3 mL Intravenous Q12H  . traZODone  100 mg Oral QHS  . venlafaxine  75 mg Oral BID WC   Continuous Infusions: .  sodium chloride    . dextrose 5 % and 0.45% NaCl 50 mL/hr at 04/11/20 1659  . pantoprozole (PROTONIX) infusion Stopped (04/10/20 1713)   PRN Meds:.sodium chloride, fluticasone, hydrOXYzine, ondansetron **OR** ondansetron (ZOFRAN) IV, oxyCODONE, polyethylene glycol, sodium chloride flush, traZODone    Anti-infectives (From admission, onward)   None        Objective:   Vitals:   04/11/20 1620 04/11/20 1625 04/11/20 1630 04/11/20 1635  BP: (!) 128/57 (!) 125/57 (!) 119/55 (!) 100/58  Pulse: 66 (!) 57 (!) 58 (!) 55  Resp: 13 20 19 18   Temp:       TempSrc:      SpO2: 100% 100% 99% 100%  Weight:      Height:        Wt Readings from Last 3 Encounters:  04/10/20 79.2 kg  05/21/19 76.6 kg  01/13/19 81.2 kg     Intake/Output Summary (Last 24 hours) at 04/11/2020 1842 Last data filed at 04/11/2020 1634 Gross per 24 hour  Intake 2082.12 ml  Output --  Net 2082.12 ml     Physical Exam  Gen:- Awake Alert,  In no apparent distress  HEENT:- [place].AT, No sclera icterus Neck-Supple Neck,No JVD,.  Lungs-  CTAB , fair symmetrical air movement CV- S1, S2 normal, regular  Abd-  +ve B.Sounds, Abd Soft, No tenderness,    Extremity/Skin:- No  edema, pedal pulses present  Psych-affect is appropriate, oriented x3 Neuro-no new focal deficits, no tremors   Data Review:   Micro Results Recent Results (from the past 240 hour(s))  Respiratory Panel by RT PCR (Flu A&B, Covid) - Nasopharyngeal Swab     Status: None   Collection Time: 04/10/20  3:23 PM   Specimen: Nasopharyngeal Swab  Result Value Ref Range Status   SARS Coronavirus 2 by RT PCR NEGATIVE NEGATIVE Final    Comment: (NOTE) SARS-CoV-2 target nucleic acids are NOT DETECTED. The SARS-CoV-2 RNA is generally detectable in upper respiratoy specimens during the acute phase of infection. The lowest concentration of SARS-CoV-2 viral copies this assay can detect is 131 copies/mL. A negative result does not preclude SARS-Cov-2 infection and should not be used as the sole basis for treatment or other patient management decisions. A negative result may occur with  improper specimen collection/handling, submission of specimen other than nasopharyngeal swab, presence of viral mutation(s) within the areas targeted by this assay, and inadequate number of viral copies (<131 copies/mL). A negative result must be combined with clinical observations, patient history, and epidemiological information. The expected result is Negative. Fact Sheet for Patients:   PinkCheek.be Fact Sheet for Healthcare Providers:  GravelBags.it This test is not yet ap proved or cleared by the [place] FDA and  has been authorized for detection and/or diagnosis of SARS-CoV-2 by FDA under an Emergency Use Authorization (EUA). This EUA will remain  in effect (meaning this test can be used) for the duration of the COVID-19 declaration under Section 564(b)(1) of the Act, 21 U.S.C. section 360bbb-3(b)(1), unless the authorization is terminated or revoked sooner.    Influenza A by PCR NEGATIVE NEGATIVE Final   Influenza B by PCR NEGATIVE NEGATIVE Final    Comment: (NOTE) The Xpert Xpress SARS-CoV-2/FLU/RSV assay is intended as an aid in  the diagnosis of influenza from Nasopharyngeal swab specimens and  should not be used as a sole basis for treatment. Nasal washings and  aspirates are unacceptable for Xpert Xpress SARS-CoV-2/FLU/RSV  testing. Fact Sheet for Patients: PinkCheek.be Fact Sheet for  Healthcare Providers: GravelBags.it This test is not yet approved or cleared by the [place] and  has been authorized for detection and/or diagnosis of SARS-CoV-2 by  FDA under an Emergency Use Authorization (EUA). This EUA will remain  in effect (meaning this test can be used) for the duration of the  Covid-19 declaration under Section 564(b)(1) of the Act, 21  U.S.C. section 360bbb-3(b)(1), unless the authorization is  terminated or revoked. Performed at [facility], [address] [place], [place] [address]     Radiology Reports No results found.   CBC Recent Labs  Lab 04/10/20 1143 04/10/20 2045 04/11/20 0438  WBC 4.4 4.3 4.4  HGB 9.8* 9.6* 8.4*  HCT 29.1* 29.1* 25.3*  PLT 195 191 191  MCV 94.2 95.4 92.7  MCH 31.7 31.5 30.8  MCHC 33.7 33.0 33.2  RDW 12.6 12.6 12.5  LYMPHSABS 1.6  --   --   MONOABS 0.4  --   --   EOSABS 0.2   --   --   BASOSABS 0.0  --   --     Chemistries  Recent Labs  Lab 04/10/20 1143 04/11/20 0438  NA 137 139  K 4.1 3.9  CL 104 107  CO2 25 26  GLUCOSE 115* 92  BUN 21 14  CREATININE 0.94 0.83  CALCIUM 9.0 8.3*  AST 14*  --   ALT 16  --   ALKPHOS 58  --   BILITOT 0.7  --    ------------------------------------------------------------------------------------------------------------------ No results for input(s): CHOL, HDL, LDLCALC, TRIG, CHOLHDL, LDLDIRECT in the last 72 hours.  No results found for: HGBA1C ------------------------------------------------------------------------------------------------------------------ No results for input(s): TSH, T4TOTAL, T3FREE, THYROIDAB in the last 72 hours.  Invalid input(s): FREET3 ------------------------------------------------------------------------------------------------------------------ No results for input(s): VITAMINB12, FOLATE, FERRITIN, TIBC, IRON, RETICCTPCT in the last 72 hours.  Coagulation profile Recent Labs  Lab 04/10/20 1143  INR 1.1    No results for input(s): DDIMER in the last 72 hours.  Cardiac Enzymes No results for input(s): CKMB, TROPONINI, MYOGLOBIN in the last 168 hours.  Invalid input(s): CK ------------------------------------------------------------------------------------------------------------------ No results found for: BNP   Roxan Hockey M.D on 04/11/2020 at 6:42 PM  Go to [email or web address] - for contact info  [facility] - Office  [phone]

## 2020-04-12 ENCOUNTER — Encounter (HOSPITAL_COMMUNITY)
Admission: EM | Disposition: A | Discharge status: Home / Self Care | Payer: Self-pay | Source: Home / Self Care | Attending: Family Medicine

## 2020-04-12 DIAGNOSIS — K5710 Diverticulosis of small intestine without perforation or abscess without bleeding: Secondary | ICD-10-CM

## 2020-04-12 DIAGNOSIS — Z9889 Other specified postprocedural states: Secondary | ICD-10-CM

## 2020-04-12 HISTORY — PX: GIVENS CAPSULE STUDY: SHX5432

## 2020-04-12 LAB — CBC
HCT: 26.7 % — ABNORMAL LOW (ref 39.0–52.0)
Hemoglobin: 9 g/dL — ABNORMAL LOW (ref 13.0–17.0)
MCH: 31.7 pg (ref 26.0–34.0)
MCHC: 33.7 g/dL (ref 30.0–36.0)
MCV: 94 fL (ref 80.0–100.0)
Platelets: 148 10*3/uL — ABNORMAL LOW (ref 150–400)
RBC: 2.84 MIL/uL — ABNORMAL LOW (ref 4.22–5.81)
RDW: 12.8 % (ref 11.5–15.5)
WBC: 4.9 10*3/uL (ref 4.0–10.5)
nRBC: 0 % (ref 0.0–0.2)

## 2020-04-12 LAB — SURGICAL PATHOLOGY

## 2020-04-12 SURGERY — IMAGING PROCEDURE, GI TRACT, INTRALUMINAL, VIA CAPSULE

## 2020-04-12 MED ORDER — SODIUM CHLORIDE 0.9 % IV SOLN: INTRAVENOUS | Status: DC

## 2020-04-12 MED ORDER — PANTOPRAZOLE SODIUM 40 MG PO TBEC: 40.0000 mg | DELAYED_RELEASE_TABLET | Freq: Two times a day (BID) | ORAL | 1 refills | Status: AC

## 2020-04-12 MED ORDER — ASPIRIN EC 81 MG PO TBEC: 81.0000 mg | DELAYED_RELEASE_TABLET | Freq: Every day | ORAL | 11 refills | Status: AC

## 2020-04-12 NOTE — Discharge Summary (Signed)
Cory [name], is a 74 y.o. male  DOB [birth year]  MRN [id number].  Admission date:  04/10/2020  Admitting Physician  Jeny Nield Denton Brick, MD  Discharge Date:  04/12/2020   Primary MD  Sharilyn Sites, MD  Recommendations for primary care physician for things to follow:   1)Avoid ibuprofen/Advil/Aleve/Motrin/Goody Powders/Naproxen/BC powders/Meloxicam/Diclofenac/Indomethacin and other Nonsteroidal anti-inflammatory medications as these will make you more likely to bleed and can cause stomach ulcers, can also cause Kidney problems.   2)Take Protonix 40 mg twice daily  3)You need a repeat CBC/complete blood count every Monday for the next 3 weeks starting Monday, 04/17/2020  4)Please follow-up with Dr. Laural Golden as previously advised  Admission Diagnosis  Acute blood loss anemia [D62] GI bleed [K92.2] Gastrointestinal hemorrhage, unspecified gastrointestinal hemorrhage type [K92.2] Acute GI bleeding [K92.2]  Discharge Diagnosis  Acute blood loss anemia [D62] GI bleed [K92.2] Gastrointestinal hemorrhage, unspecified gastrointestinal hemorrhage type [K92.2] Acute GI bleeding [K92.2]    Principal Problem:   GI bleed Active Problems:   CAD in native artery   Essential hypertension   NSTEMI (non-ST elevated myocardial infarction) ([place])   Ischemic cardiomyopathy   Acute GI bleeding     Past Medical History:  Diagnosis Date  . Anemia   . Arthritis   . Coronary artery disease    a. 09/2001 PCI/BMS to D1: 2.5 x 15 Express 2 BMS;  b. 2011 Cath: stable anatomy; c. 03/2014 Low risk MV, EF 54%;  d. 09/2016 Inf STEMI/PCI [facility]): LM nl, LAD 48m D1 nl w/ 90% in 132minf branch, LCX nl, RCA 99 (Synergy DES x 4 - 3.5x28 prox, 3.5x3812m.5x16d, 2.25x16 to RPDA), EF 55%.  . GMarland KitchenRD (gastroesophageal reflux disease)   . Hyperlipidemia   . Hypertensive heart disease   . [place]   . Pneumonia   . PTSD (post-traumatic stress disorder)     Past Surgical History:  Procedure Laterality Date  . BIOPSY  04/10/2020   Procedure: BIOPSY;  Surgeon: RehRogene HoustonD;  [facility];  Service: Endoscopy;;  distal, esophageal  . CARDIAC CATHETERIZATION  05/17/2010   left main free of significant lesion; LAD with 60% lesion in mid segment & 60-70% lesion in mid-distal segment with subtotal occlusion at apex & small side branch of diagonal 1 that is subtotally occluded; ramus intermedius with 90% ostial stenosis; L Cfx w/mild luminal irreg; RCA is dominant vessel with diffuse disease, 50% prox lesion & 60% lesion in mid segment & 60% lesion in PDA  . CATARACT EXTRACTION W/PHACO Right 06/07/2015   Procedure: CATARACT EXTRACTION PHACO AND INTRAOCULAR LENS PLACEMENT (IOC);  Surgeon: RoyMarylynn PearsonD;  [facility];  Laterality: Right;  . COLONOSCOPY  02/25/2012   Procedure: COLONOSCOPY;  Surgeon: MarJamesetta SoD;  [facility];  Service: Gastroenterology;  Laterality: N/A;  . CORONARY ANGIOPLASTY WITH STENT PLACEMENT  09/2001   Taxus (2.5x15m20mtent to 1st diagonal   . ESOPHAGEAL DILATION N/A 08/14/2016  Procedure: ESOPHAGEAL DILATION;  Surgeon: Rogene Houston, MD;  [facility];  Service: Endoscopy;  Laterality: N/A;  . ESOPHAGOGASTRODUODENOSCOPY  02/25/2012   Procedure: ESOPHAGOGASTRODUODENOSCOPY (EGD);  Surgeon: Jamesetta So, MD;  [facility];  Service: Gastroenterology;  Laterality: N/A;  . ESOPHAGOGASTRODUODENOSCOPY N/A 07/28/2014   Procedure: ESOPHAGOGASTRODUODENOSCOPY (EGD);  Surgeon: Rogene Houston, MD;  [facility];  Service: Endoscopy;  Laterality: N/A;  200-rescheduled to [place] notified pt  . ESOPHAGOGASTRODUODENOSCOPY N/A 08/14/2016   Procedure: ESOPHAGOGASTRODUODENOSCOPY (EGD);  Surgeon: Rogene Houston, MD;  [facility];  Service: Endoscopy;  Laterality: N/A;  100  .  ESOPHAGOGASTRODUODENOSCOPY N/A 04/10/2020   Procedure: ESOPHAGOGASTRODUODENOSCOPY (EGD);  Surgeon: Rogene Houston, MD;  [facility];  Service: Endoscopy;  Laterality: N/A;  . EYE SURGERY Bilateral    lasik surgery   . KNEE ARTHROSCOPY WITH MEDIAL MENISECTOMY Left 02/05/2018   Procedure: LEFT KNEE ARTHROSCOPY WITH PARTIAL MEDIAL MENISECTOMY;  Surgeon: Carole Civil, MD;  Location: [facility];  Service: Orthopedics;  Laterality: Left;  . KNEE SURGERY    . LATERAL EPICONDYLE RELEASE Left 01/21/2014   Procedure: Left Tennis Elbow Release with debridement  tendon repair with reattachment;  Surgeon: Carole Civil, MD;  Location: [facility];  Service: Orthopedics;  Laterality: Left;  . penile implant    . PROSTATE SURGERY    . SHOULDER SURGERY    . TONSILLECTOMY    . TOTAL KNEE ARTHROPLASTY Left 10/19/2018   Procedure: LEFT TOTAL KNEE ARTHROPLASTY;  Surgeon: Vickey Huger, MD;  Location: [facility];  Service: Orthopedics;  Laterality: Left;  with abductor block failed spinal  . TRANSTHORACIC ECHOCARDIOGRAM  03/2010   EF=>55%; LV mildly dilated; LA mod dilated; mild MR & TR; normal RVSP; mild pulm valve regurg     HPI  from the history and physical done on the day of admission:    Cory [name]  is a 74 y.o. male non-smoker with past medical history relevant for CAD, Barrett's esophagus, history of ischemic cardiomyopathy anxiety and depression who presents to the ED with concerns of bright red blood per rectum followed by melena -Patient complains of fatigue, progressive weakness, and some dyspnea on exertion no dizziness no syncope -Hemoglobin is down to 9.8 apparently it was 12.2 at outside facility less than 2 weeks ago -No hematemesis Last EGD was in August 2017 revealing erosive reflux esophagitis and short segment Barrett's without dysplasia and small sliding hiatal hernia. His last colonoscopy was in February 2013 revealing pancolonic diverticulosis.  ,No fever  Or chills    No Nausea, Vomiting or Diarrhea -No chest pains -Creatinine in ED is 0.94 -INR is 1.1 -WBC 4.4 and platelets 195    Hospital Course:    1)acute GI bleed-- -patient had BRBPR as well as melena EGD was in August 2017 revealing erosive reflux esophagitis and short segment Barrett's without dysplasia and small sliding hiatal hernia. --Repeat EGD on 04/10/2020 with short segment Barrett's esophagus, no evidence of bleeding His last colonoscopy was in February 2013 revealing pancolonic diverticulosis. -colonoscopy on 04/11/2020 without definite etiology for bleeding -Had Capsule Endoscopy  - Repeat CBC/complete blood count every Monday for the next 3 weeks starting Monday, 04/17/2020  follow-up with Dr. Laural Golden as previously advised  2)Acute on chronic symptomatic anemia-- -Hemoglobin is down to 9.0 from 9.8 on admission , it was12.2 at outside facility within the last couple weeks --no dizzinessorpalpitations or shortness of breath at rest -EGD and colonoscopy as  above -Capsule endoscopy as above  3)HFrEF/ischemic cardiomyopathy---patient with combined systolic and diastolic dysfunction CHF with EF of 40 to 45% based on echo from 08/19/2019--- --currently appears euvolemic and clinically no CHF flareup -Continue Avapro, aspirin and Coreg  4)H/o CAD--stable, no chest pain, no ACS type symptoms, continue Coreg, c/n aspirin,Lipitor 80 mg daily  5)Anxiety and Depression--stable, continue Wellbutrin 100 mg daily, effexor,  hydroxyzine as needed and trazodone nightly  Disposition--- home  Code Status : full   Family Communication:  (patient is alert, awake and coherent)  Consults  :  Gi   Discharge Condition: stable  Follow UP---weekly CBC   Diet and Activity recommendation:  As advised  Discharge Instructions    Discharge Instructions    Call MD for:  difficulty breathing, headache or visual disturbances   Complete by: As directed    Call MD for:  persistant  dizziness or light-headedness   Complete by: As directed    Call MD for:  persistant nausea and vomiting   Complete by: As directed    Call MD for:  severe uncontrolled pain   Complete by: As directed    Call MD for:  temperature >100.4   Complete by: As directed    Diet - low sodium heart healthy   Complete by: As directed    Discharge instructions   Complete by: As directed    1)Avoid ibuprofen/Advil/Aleve/Motrin/Goody Powders/Naproxen/BC powders/Meloxicam/Diclofenac/Indomethacin and other Nonsteroidal anti-inflammatory medications as these will make you more likely to bleed and can cause stomach ulcers, can also cause Kidney problems.   2) take Protonix 40 mg twice daily  3) you need a repeat CBC/complete blood count every Monday for the next 3 weeks starting Monday, 04/17/2020  4) please follow-up with Dr. Laural Golden as previously advised   Increase activity slowly   Complete by: As directed        Discharge Medications     Allergies as of 04/12/2020      Reactions   Ace Inhibitors Other (See Comments)   unknown   Benadryl [diphenhydramine Hcl] Other (See Comments)   Opposite reaction of medication purpose - hyper   Vioxx [rofecoxib] Other (See Comments)   headache      Medication List    TAKE these medications   acetaminophen 325 MG tablet Commonly known as: TYLENOL Take 650 mg by mouth daily as needed for moderate pain or headache.   aspirin EC 81 MG tablet Take 1 tablet (81 mg total) by mouth daily with breakfast. What changed:   medication strength  how much to take  when to take this   atorvastatin 80 MG tablet Commonly known as: LIPITOR Take 1 tablet (80 mg total) by mouth daily at 6 PM.   buPROPion 100 MG 12 hr tablet Commonly known as: WELLBUTRIN SR Take 100 mg by mouth daily.   carvedilol 3.125 MG tablet Commonly known as: COREG Take 1 tablet (3.125 mg total) by mouth 2 (two) times daily.   fluticasone 50 MCG/ACT nasal spray Commonly known as:  FLONASE Place 1 spray into both nostrils daily as needed for allergies or rhinitis.   hydrOXYzine 10 MG tablet Commonly known as: ATARAX/VISTARIL Take 10 mg by mouth at bedtime as needed. 1 tab HS PRN, 1-2 tabs PRN for anxiety.   irbesartan 300 MG tablet Commonly known as: AVAPRO Take 1 tablet (300 mg total) by mouth daily.   loratadine 10 MG tablet Commonly known as: CLARITIN Take 10 mg by mouth daily.   Magnesium Malate  1250 (141.7 Mg) MG Tabs Take 1,250 mg by mouth every evening.   methocarbamol 500 MG tablet Commonly known as: ROBAXIN Take 1-2 tablets (500-1,000 mg total) by mouth every 6 (six) hours as needed for muscle spasms.   oxyCODONE 5 MG immediate release tablet Commonly known as: Oxy IR/ROXICODONE Take 1-2 tablets (5-10 mg total) by mouth every 6 (six) hours as needed for moderate pain (pain score 4-6).   pantoprazole 40 MG tablet Commonly known as: PROTONIX Take 1 tablet (40 mg total) by mouth 2 (two) times daily before a meal.   traZODone 100 MG tablet Commonly known as: DESYREL Take 100 mg by mouth at bedtime.   venlafaxine 75 MG tablet Commonly known as: EFFEXOR Take 75 mg by mouth daily.      Major procedures and Radiology Reports - PLEASE review detailed and final reports for all details, in brief -   No results found.  Micro Results   Recent Results (from the past 240 hour(s))  Respiratory Panel by RT PCR (Flu A&B, Covid) - Nasopharyngeal Swab     Status: None   Collection Time: 04/10/20  3:23 PM   Specimen: Nasopharyngeal Swab  Result Value Ref Range Status   SARS Coronavirus 2 by RT PCR NEGATIVE NEGATIVE Final    Comment: (NOTE) SARS-CoV-2 target nucleic acids are NOT DETECTED. The SARS-CoV-2 RNA is generally detectable in upper respiratoy specimens during the acute phase of infection. The lowest concentration of SARS-CoV-2 viral copies this assay can detect is 131 copies/mL. A negative result does not preclude SARS-Cov-2 infection and  should not be used as the sole basis for treatment or other patient management decisions. A negative result may occur with  improper specimen collection/handling, submission of specimen other than nasopharyngeal swab, presence of viral mutation(s) within the areas targeted by this assay, and inadequate number of viral copies (<131 copies/mL). A negative result must be combined with clinical observations, patient history, and epidemiological information. The expected result is Negative. Fact Sheet for Patients:  PinkCheek.be Fact Sheet for Healthcare Providers:  GravelBags.it This test is not yet ap proved or cleared by the [place] FDA and  has been authorized for detection and/or diagnosis of SARS-CoV-2 by FDA under an Emergency Use Authorization (EUA). This EUA will remain  in effect (meaning this test can be used) for the duration of the COVID-19 declaration under Section 564(b)(1) of the Act, 21 U.S.C. section 360bbb-3(b)(1), unless the authorization is terminated or revoked sooner.    Influenza A by PCR NEGATIVE NEGATIVE Final   Influenza B by PCR NEGATIVE NEGATIVE Final    Comment: (NOTE) The Xpert Xpress SARS-CoV-2/FLU/RSV assay is intended as an aid in  the diagnosis of influenza from Nasopharyngeal swab specimens and  should not be used as a sole basis for treatment. Nasal washings and  aspirates are unacceptable for Xpert Xpress SARS-CoV-2/FLU/RSV  testing. Fact Sheet for Patients: PinkCheek.be Fact Sheet for Healthcare Providers: GravelBags.it This test is not yet approved or cleared by the [place] FDA and  has been authorized for detection and/or diagnosis of SARS-CoV-2 by  FDA under an Emergency Use Authorization (EUA). This EUA will remain  in effect (meaning this test can be used) for the duration of the  Covid-19 declaration under Section  564(b)(1) of the Act, 21  U.S.C. section 360bbb-3(b)(1), unless the authorization is  terminated or revoked. Performed at [facility], [address], [place], [place] [address]    Today   Subjective    Cory Crumb  [name] today has no new concerns Tolerating oral intake well          Patient has been seen and examined prior to discharge   Objective   Blood pressure 136/75, pulse 71, temperature (!) 97.5 F (36.4 C), temperature source Oral, resp. rate 16, height '5\' 10"'$  (1.778 m), weight 79.2 kg, SpO2 100 %.   Intake/Output Summary (Last 24 hours) at 04/12/2020 1701 Last data filed at 04/12/2020 1023 Gross per 24 hour  Intake 240 ml  Output --  Net 240 ml    Exam Gen:- Awake Alert,  In no apparent distress  HEENT:- [place].AT, No sclera icterus Neck-Supple Neck,No JVD,.  Lungs-  CTAB , fair symmetrical air movement CV- S1, S2 normal, regular  Abd-  +ve B.Sounds, Abd Soft, No tenderness,    Extremity/Skin:- No  edema, pedal pulses present  Psych-affect is appropriate, oriented x3 Neuro-no new focal deficits, no tremors   Data Review   CBC w Diff:  Lab Results  Component Value Date   WBC 4.9 04/12/2020   HGB 9.0 (L) 04/12/2020   HCT 26.7 (L) 04/12/2020   PLT 148 (L) 04/12/2020   LYMPHOPCT 36 04/10/2020   MONOPCT 8 04/10/2020   EOSPCT 4 04/10/2020   BASOPCT 1 04/10/2020    CMP:  Lab Results  Component Value Date   NA 139 04/11/2020   K 3.9 04/11/2020   CL 107 04/11/2020   CO2 26 04/11/2020   BUN 14 04/11/2020   CREATININE 0.83 04/11/2020   PROT 6.2 (L) 04/10/2020   ALBUMIN 3.8 04/10/2020   BILITOT 0.7 04/10/2020   ALKPHOS 58 04/10/2020   AST 14 (L) 04/10/2020   ALT 16 04/10/2020  .   Total Discharge time is about 33 minutes  Roxan Hockey M.D on 04/12/2020 at 5:01 PM  Go to [email or web address] -  for contact info  [facility] - Office  [phone]

## 2020-04-12 NOTE — Plan of Care (Signed)

## 2020-04-12 NOTE — Op Note (Signed)
Small Bowel G[place]ens Capsule Study Procedure date: April 12, 2020  Referring Provider:  Roxan Hockey, MD PCP:  Dr. Sharilyn Sites, MD  Indication for procedure:   Patient is 74 year old Caucasian male who is on low-dose aspirin who presented with melena and anemia.  He underwent esophagogastroduodenoscopy followed by colonoscopy but no bleeding lesion identified.  He is stable for undergoing small bowel given capsule study looking for his source of GI blood loss.   Findings:   Patient was able to swallow given capsule without any difficulty. Study is complete as given capsule reached colon.  Study duration 8 hours 31 minutes and 41 seconds. Single diverticulum noted in jejunum without clot or stigmata of bleed. Rest of the small bowel was normal.  First Gastric image: 1 minutes and 11 seconds First Duodenal image: 59 minutes and 30 seconds First Ileo-Cecal Valve image: 6 hours 32 minutes and 6 seconds First Cecal image: 6 hours 32 minutes and 8 seconds Gastric Passage time: 58 minutes and 19 seconds Small Bowel Passage time: 5 hours and 31 minutes.  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 No other abnormalities noted to small bowel mucosa.  Study results reviewed with patient who is stable for discharge. Patient advised to hold low-dose aspirin for rest of this week. He will have CBC on 04/17/2020. Patient advised to report to emergency room immediately if he has another bout of melena or rectal bleeding in which case we will proceed with CTA abdomen and pelvis and/or GI bleeding scan.

## 2020-04-12 NOTE — Plan of Care (Signed)
Problem: Education: Goal: Knowledge of General Education information will improve Description: Including pain rating scale, medication(s)/side effects and non-pharmacologic comfort measures 04/12/2020 1745 by Melony Overly, RN Outcome: Adequate for Discharge 04/12/2020 1542 by Melony Overly, RN Outcome: Progressing   Problem: Health Behavior/Discharge Planning: Goal: Ability to manage health-related needs will improve 04/12/2020 1745 by Melony Overly, RN Outcome: Adequate for Discharge 04/12/2020 1542 by Melony Overly, RN Outcome: Progressing   Problem: Clinical Measurements: Goal: Ability to maintain clinical measurements within normal limits will improve 04/12/2020 1745 by Melony Overly, RN Outcome: Adequate for Discharge 04/12/2020 1542 by Melony Overly, RN Outcome: Progressing Goal: Will remain free from infection 04/12/2020 1745 by Melony Overly, RN Outcome: Adequate for Discharge 04/12/2020 1542 by Melony Overly, RN Outcome: Progressing Goal: Diagnostic test results will improve 04/12/2020 1745 by Melony Overly, RN Outcome: Adequate for Discharge 04/12/2020 1542 by Melony Overly, RN Outcome: Progressing Goal: Respiratory complications will improve 04/12/2020 1745 by Melony Overly, RN Outcome: Adequate for Discharge 04/12/2020 1542 by Melony Overly, RN Outcome: Progressing Goal: Cardiovascular complication will be avoided 04/12/2020 1745 by Melony Overly, RN Outcome: Adequate for Discharge 04/12/2020 1542 by Melony Overly, RN Outcome: Progressing   Problem: Activity: Goal: Risk for activity intolerance will decrease 04/12/2020 1745 by Melony Overly, RN Outcome: Adequate for Discharge 04/12/2020 1542 by Melony Overly, RN Outcome: Progressing   Problem: Nutrition: Goal: Adequate nutrition will be maintained 04/12/2020 1745 by Melony Overly, RN Outcome: Adequate for Discharge 04/12/2020 1542 by Melony Overly, RN Outcome: Progressing    Problem: Coping: Goal: Level of anxiety will decrease 04/12/2020 1745 by Melony Overly, RN Outcome: Adequate for Discharge 04/12/2020 1542 by Melony Overly, RN Outcome: Progressing   Problem: Elimination: Goal: Will not experience complications related to bowel motility 04/12/2020 1745 by Melony Overly, RN Outcome: Adequate for Discharge 04/12/2020 1542 by Melony Overly, RN Outcome: Progressing Goal: Will not experience complications related to urinary retention 04/12/2020 1745 by Melony Overly, RN Outcome: Adequate for Discharge 04/12/2020 1542 by Melony Overly, RN Outcome: Progressing   Problem: Pain Managment: Goal: General experience of comfort will improve 04/12/2020 1745 by Melony Overly, RN Outcome: Adequate for Discharge 04/12/2020 1542 by Melony Overly, RN Outcome: Progressing   Problem: Safety: Goal: Ability to remain free from injury will improve 04/12/2020 1745 by Melony Overly, RN Outcome: Adequate for Discharge 04/12/2020 1542 by Melony Overly, RN Outcome: Progressing   Problem: Skin Integrity: Goal: Risk for impaired skin integrity will decrease 04/12/2020 1745 by Melony Overly, RN Outcome: Adequate for Discharge 04/12/2020 1542 by Melony Overly, RN Outcome: Progressing   Problem: Education: Goal: Ability to identify signs and symptoms of gastrointestinal bleeding will improve 04/12/2020 1745 by Melony Overly, RN Outcome: Adequate for Discharge 04/12/2020 1542 by Melony Overly, RN Outcome: Progressing   Problem: Bowel/Gastric: Goal: Will show no signs and symptoms of gastrointestinal bleeding 04/12/2020 1745 by Melony Overly, RN Outcome: Adequate for Discharge 04/12/2020 1542 by Melony Overly, RN Outcome: Progressing   Problem: Fluid Volume: Goal: Will show no signs and symptoms of excessive bleeding 04/12/2020 1745 by Melony Overly, RN Outcome: Adequate for Discharge 04/12/2020 1542 by Melony Overly, RN Outcome:  Progressing   Problem: Clinical Measurements: Goal: Complications related to the disease process, condition or treatment will be avoided or minimized 04/12/2020 1745 by Melony Overly, RN Outcome:  Adequate for Discharge 04/12/2020 1542 by Melony Overly, RN Outcome: Progressing

## 2020-04-12 NOTE — Discharge Instructions (Signed)
1)Avoid ibuprofen/Advil/Aleve/Motrin/Goody Powders/Naproxen/BC powders/Meloxicam/Diclofenac/Indomethacin and other Nonsteroidal anti-inflammatory medications as these will make you more likely to bleed and can cause stomach ulcers, can also cause Kidney problems.   2) take Protonix 40 mg twice daily  3) you need a repeat CBC/complete blood count every Monday for the next 3 weeks starting Monday, 04/17/2020  4) please follow-up with Dr. Laural Golden as previously advised

## 2020-04-14 ENCOUNTER — Other Ambulatory Visit (INDEPENDENT_AMBULATORY_CARE_PROVIDER_SITE_OTHER): Payer: Self-pay | Admitting: *Deleted

## 2020-04-14 DIAGNOSIS — K2270 Barrett's esophagus without dysplasia: Secondary | ICD-10-CM

## 2020-04-17 LAB — CBC
HCT: 30 % — ABNORMAL LOW (ref 38.5–50.0)
Hemoglobin: 10 g/dL — ABNORMAL LOW (ref 13.2–17.1)
MCH: 30.9 pg (ref 27.0–33.0)
MCHC: 33.3 g/dL (ref 32.0–36.0)
MCV: 92.6 fL (ref 80.0–100.0)
MPV: 9.7 fL (ref 7.5–12.5)
Platelets: 252 10*3/uL (ref 140–400)
RBC: 3.24 10*6/uL — ABNORMAL LOW (ref 4.20–5.80)
RDW: 12.2 % (ref 11.0–15.0)
WBC: 4.8 10*3/uL (ref 3.8–10.8)

## 2020-04-19 ENCOUNTER — Other Ambulatory Visit (INDEPENDENT_AMBULATORY_CARE_PROVIDER_SITE_OTHER): Payer: Self-pay | Admitting: *Deleted

## 2020-04-19 DIAGNOSIS — D649 Anemia, unspecified: Secondary | ICD-10-CM

## 2020-04-19 DIAGNOSIS — K2270 Barrett's esophagus without dysplasia: Secondary | ICD-10-CM

## 2020-04-25 ENCOUNTER — Other Ambulatory Visit (INDEPENDENT_AMBULATORY_CARE_PROVIDER_SITE_OTHER): Payer: Self-pay | Admitting: *Deleted

## 2020-04-25 DIAGNOSIS — K2270 Barrett's esophagus without dysplasia: Secondary | ICD-10-CM

## 2020-04-25 DIAGNOSIS — D649 Anemia, unspecified: Secondary | ICD-10-CM

## 2020-05-03 LAB — HEMOGLOBIN AND HEMATOCRIT, BLOOD
HCT: 31.7 % — ABNORMAL LOW (ref 38.5–50.0)
Hemoglobin: 10.3 g/dL — ABNORMAL LOW (ref 13.2–17.1)

## 2020-05-05 ENCOUNTER — Other Ambulatory Visit (INDEPENDENT_AMBULATORY_CARE_PROVIDER_SITE_OTHER): Payer: Self-pay | Admitting: *Deleted

## 2020-05-05 DIAGNOSIS — D649 Anemia, unspecified: Secondary | ICD-10-CM

## 2020-05-17 ENCOUNTER — Other Ambulatory Visit (INDEPENDENT_AMBULATORY_CARE_PROVIDER_SITE_OTHER): Payer: Self-pay | Admitting: *Deleted

## 2020-05-17 DIAGNOSIS — D649 Anemia, unspecified: Secondary | ICD-10-CM

## 2020-05-17 LAB — CBC
HCT: 33.4 % — ABNORMAL LOW (ref 38.5–50.0)
Hemoglobin: 10.8 g/dL — ABNORMAL LOW (ref 13.2–17.1)
MCH: 28.3 pg (ref 27.0–33.0)
MCHC: 32.3 g/dL (ref 32.0–36.0)
MCV: 87.4 fL (ref 80.0–100.0)
MPV: 10.6 fL (ref 7.5–12.5)
Platelets: 187 10*3/uL (ref 140–400)
RBC: 3.82 10*6/uL — ABNORMAL LOW (ref 4.20–5.80)
RDW: 13.1 % (ref 11.0–15.0)
WBC: 5.1 10*3/uL (ref 3.8–10.8)

## 2020-05-18 ENCOUNTER — Other Ambulatory Visit (INDEPENDENT_AMBULATORY_CARE_PROVIDER_SITE_OTHER): Payer: Self-pay | Admitting: *Deleted

## 2020-05-18 DIAGNOSIS — K2270 Barrett's esophagus without dysplasia: Secondary | ICD-10-CM

## 2020-05-18 DIAGNOSIS — D649 Anemia, unspecified: Secondary | ICD-10-CM

## 2020-06-07 ENCOUNTER — Ambulatory Visit (INDEPENDENT_AMBULATORY_CARE_PROVIDER_SITE_OTHER): Payer: Medicare PPO | Admitting: Gastroenterology

## 2020-06-07 ENCOUNTER — Other Ambulatory Visit: Payer: Self-pay

## 2020-06-07 ENCOUNTER — Encounter (INDEPENDENT_AMBULATORY_CARE_PROVIDER_SITE_OTHER): Payer: Self-pay | Admitting: Gastroenterology

## 2020-06-07 VITALS — BP 153/79 | HR 58 | Temp 97.1°F | Ht 69.0 in | Wt 181.5 lb

## 2020-06-07 DIAGNOSIS — K2270 Barrett's esophagus without dysplasia: Secondary | ICD-10-CM

## 2020-06-07 DIAGNOSIS — K922 Gastrointestinal hemorrhage, unspecified: Secondary | ICD-10-CM

## 2020-06-07 NOTE — Progress Notes (Signed)
Patient profile: Cory [name] is a 74 y.o. male seen for follow-up.  History of Present Illness: Cory [name] is seen today for hospital follow-up. He was seen in April 2021 inpatient for GI bleed. He had 1 week of weakness, melena prior to admission. His baseline hemoglobin was 14.9 in March 2021. On admission hemoglobin was 9.8. He had colonoscopy endoscopy and capsule study as below. He is seen today for follow-up.  He reports his energy and fatigue have significantly improved since his discharge. He is feeling fairly well. He has no specific complaints today. He denies any abdominal pain. He is having brown stool daily without straining. Feels his appetite is good. Reports he is on chronic long-term PPI twice daily for GERD which controls his symptoms well-no breakthrough reflux symptoms. He denies any nausea vomiting or dysphagia. His weight is up compared to April 2021.    Wt Readings from Last 3 Encounters:  06/07/20 181 lb 8 oz (82.3 kg)  04/10/20 174 lb 9.6 oz (79.2 kg)  05/21/19 168 lb 12.8 oz (76.6 kg)     Last Colonoscopy: 03/2020 Diverticulosis in the sigmoid colon and in the descending colon. - No specimens collected.   Last Endoscopy: 03/2020-- Normal hypopharynx.                           - Esophageal mucosal changes secondary to                            established short-segment Barrett's disease.                            Biopsied.                           - Z-line regular, 39 cm from the incisors.                           - 2 cm hiatal hernia.                           - Normal stomach.                           - Normal duodenal bulb, second portion of the                            duodenum and third portion of the duodenum.                           comment: healed esophagitis since EGD of August,                            2017.                           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 No other abnormalities noted to small bowel mucosa.   Past Medical History:  Past Medical History:  Diagnosis Date   Anemia  Arthritis    Coronary artery disease    a. 09/2001 PCI/BMS to D1: 2.5 x 15 Express 2 BMS;  b. 2011 Cath: stable anatomy; c. 03/2014 Low risk MV, EF 54%;  d. 09/2016 Inf STEMI/PCI [facility]): LM nl, LAD 66m, D1 nl w/ 90% in 77mm inf branch, LCX nl, RCA 99 (Synergy DES x 4 - 3.5x28 prox, 3.5x56m, 2.5x16d, 2.25x16 to RPDA), EF 55%.   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   S/P left knee arthroscopy 02/05/18 02/12/2018   Ischemic cardiomyopathy 11/18/2017   Acute pain of left knee 01/24/2017   NSTEMI (non-ST elevated myocardial infarction) ([place]) 01/08/2017   ST elevation myocardial infarction (STEMI) of inferior wall, subsequent episode of care ([place]) 01/08/2017   Mixed hyperlipidemia 01/08/2017   Sinus bradycardia 01/08/2017   Left tennis elbow 11/08/2013   Stable angina ([place]) 11/08/2013   CAD in native artery 11/08/2013   PTSD (post-traumatic stress disorder) 11/08/2013   Essential hypertension 11/08/2013   Dyslipidemia 11/08/2013   Fracture of phalanx of thumb 12/03/2011    Past Surgical History: Past Surgical History:  Procedure Laterality Date   BIOPSY  04/10/2020   Procedure: BIOPSY;  Surgeon: Rogene Houston, MD;  [facility];  Service: Endoscopy;;  distal, esophageal   CARDIAC CATHETERIZATION  05/17/2010   left main free of significant lesion; LAD with 60% lesion in mid segment & 60-70% lesion in mid-distal segment with  subtotal occlusion at apex & small side branch of diagonal 1 that is subtotally occluded; ramus intermedius with 90% ostial stenosis; L Cfx w/mild luminal irreg; RCA is dominant vessel with diffuse disease, 50% prox lesion & 60% lesion in mid segment & 60% lesion in PDA   CATARACT EXTRACTION W/PHACO Right 06/07/2015   Procedure: CATARACT EXTRACTION PHACO AND INTRAOCULAR LENS PLACEMENT ([place]);  Surgeon: Marylynn Pearson, MD;  Location: [place];  Service: Ophthalmology;  Laterality: Right;   COLONOSCOPY  02/25/2012   Procedure: COLONOSCOPY;  Surgeon: Jamesetta So, MD;  [facility];  Service: Gastroenterology;  Laterality: N/A;   COLONOSCOPY N/A 04/11/2020   Procedure: COLONOSCOPY;  Surgeon: Rogene Houston, MD;  [facility];  Service: Endoscopy;  Laterality: N/A;   CORONARY ANGIOPLASTY WITH STENT PLACEMENT  09/2001   Taxus (2.5x62mm) stent to 1st diagonal    ESOPHAGEAL DILATION N/A 08/14/2016   Procedure: ESOPHAGEAL DILATION;  Surgeon: Rogene Houston, MD;  [facility];  Service: Endoscopy;  Laterality: N/A;   ESOPHAGOGASTRODUODENOSCOPY  02/25/2012   Procedure: ESOPHAGOGASTRODUODENOSCOPY (EGD);  Surgeon: Jamesetta So, MD;  [facility];  Service: Gastroenterology;  Laterality: N/A;   ESOPHAGOGASTRODUODENOSCOPY N/A 07/28/2014   Procedure: ESOPHAGOGASTRODUODENOSCOPY (EGD);  Surgeon: Rogene Houston, MD;  [facility];  Service: Endoscopy;  Laterality: N/A;  200-rescheduled to 23 Ann notified pt   ESOPHAGOGASTRODUODENOSCOPY N/A 08/14/2016   Procedure: ESOPHAGOGASTRODUODENOSCOPY (EGD);  Surgeon: Rogene Houston, MD;  [facility];  Service: Endoscopy;  Laterality: N/A;  100   ESOPHAGOGASTRODUODENOSCOPY N/A 04/10/2020   Procedure: ESOPHAGOGASTRODUODENOSCOPY (EGD);  Surgeon: Rogene Houston, MD;  [facility];  Service: Endoscopy;  Laterality:  N/A;   EYE SURGERY Bilateral    lasik surgery    GIVENS CAPSULE STUDY N/A  04/12/2020   Procedure: GIVENS CAPSULE STUDY;  Surgeon: Rogene Houston, MD;  [facility];  Service: Endoscopy;  Laterality: N/A;   KNEE ARTHROSCOPY WITH MEDIAL MENISECTOMY Left 02/05/2018   Procedure: LEFT KNEE ARTHROSCOPY WITH PARTIAL MEDIAL MENISECTOMY;  Surgeon: Carole Civil, MD;  [facility];  Service: Orthopedics;  Laterality: Left;   KNEE SURGERY     LATERAL EPICONDYLE RELEASE Left 01/21/2014   Procedure: Left Tennis Elbow Release with debridement  tendon repair with reattachment;  Surgeon: Carole Civil, MD;  [facility];  Service: Orthopedics;  Laterality: Left;   penile implant     PROSTATE SURGERY     SHOULDER SURGERY     TONSILLECTOMY     TOTAL KNEE ARTHROPLASTY Left 10/19/2018   Procedure: LEFT TOTAL KNEE ARTHROPLASTY;  Surgeon: Vickey Huger, MD;  [facility];  Service: Orthopedics;  Laterality: Left;  with abductor block failed spinal   TRANSTHORACIC ECHOCARDIOGRAM  03/2010   EF=>55%; LV mildly dilated; LA mod dilated; mild MR & TR; normal RVSP; mild pulm valve regurg    Allergies: Allergies  Allergen Reactions   Ace Inhibitors Other (See Comments)    unknown   Benadryl [Diphenhydramine Hcl] Other (See Comments)    Opposite reaction of medication purpose - hyper   Vioxx [Rofecoxib] Other (See Comments)    headache      Home Medications:  Current Outpatient Medications:    acetaminophen (TYLENOL) 325 MG tablet, Take 650 mg by mouth daily as needed for moderate pain or headache., Disp: , Rfl:    aspirin EC 81 MG tablet, Take 1 tablet (81 mg total) by mouth daily with breakfast., Disp: 30 tablet, Rfl: 11   atorvastatin (LIPITOR) 80 MG tablet, Take 1 tablet (80 mg total) by mouth daily at 6 PM., Disp: 90 tablet, Rfl: 3   buPROPion (WELLBUTRIN SR) 100 MG 12 hr tablet, Take 100 mg by mouth daily., Disp: , Rfl:    fluticasone (FLONASE) 50 MCG/ACT nasal spray, Place 1 spray into both nostrils daily as needed for allergies  or rhinitis., Disp: , Rfl:    hydrOXYzine (ATARAX/VISTARIL) 10 MG tablet, Take 10 mg by mouth at bedtime as needed. 1 tab HS PRN, 1-2 tabs PRN for anxiety., Disp: , Rfl:    irbesartan (AVAPRO) 300 MG tablet, Take 1 tablet (300 mg total) by mouth daily., Disp: 90 tablet, Rfl: 2   loratadine (CLARITIN) 10 MG tablet, Take 10 mg by mouth daily. , Disp: , Rfl:    Magnesium Malate 1250 (141.7 Mg) MG TABS, Take 1,250 mg by mouth every evening. , Disp: , Rfl:    methocarbamol (ROBAXIN) 500 MG tablet, Take 1-2 tablets (500-1,000 mg total) by mouth every 6 (six) hours as needed for muscle spasms., Disp: 60 tablet, Rfl: 0   pantoprazole (PROTONIX) 40 MG tablet, Take 1 tablet (40 mg total) by mouth 2 (two) times daily before a meal., Disp: 180 tablet, Rfl: 1   traZODone (DESYREL) 100 MG tablet, Take 100 mg by mouth at bedtime. , Disp: , Rfl:    venlafaxine (EFFEXOR) 75 MG tablet, Take 75 mg by mouth daily., Disp: , Rfl:    carvedilol (COREG) 3.125 MG tablet, Take 1 tablet (3.125 mg total) by mouth 2 (two) times daily., Disp: 180 tablet, Rfl: 3   oxyCODONE (OXY IR/ROXICODONE) 5 MG immediate release tablet, Take 1-2 tablets (5-10  mg total) by mouth every 6 (six) hours as needed for moderate pain (pain score 4-6). (Patient not taking: Reported on 06/07/2020), Disp: 50 tablet, Rfl: 0   Family History: family history includes Colitis in his maternal aunt.    Social History:   reports that he has never smoked. He has never used smokeless tobacco. He reports that he does not drink alcohol or use drugs.   Review of Systems: Constitutional: Denies weight loss/weight gain  Eyes: No changes in vision. ENT: No oral lesions, sore throat.  GI: see HPI.  Heme/Lymph: No easy bruising.  CV: No chest pain.  GU: No hematuria.  Integumentary: No rashes.  Neuro: No headaches.  Psych: No depression/anxiety.  Endocrine: No heat/cold intolerance.  Allergic/Immunologic: No urticaria.  Resp: No cough, SOB.   Musculoskeletal: No joint swelling.    Physical Examination: BP (!) 153/79 (BP Location: Right Arm, Patient Position: Sitting, Cuff Size: Large)    Pulse (!) 58    Temp (!) 97.1 F (36.2 C) (Temporal)    Ht 5\' 9"  (1.753 m)    Wt 181 lb 8 oz (82.3 kg)    BMI 26.80 kg/m  Gen: NAD, alert and oriented x 4 HEENT: PEERLA, EOMI, Neck: supple, no JVD Chest: CTA bilaterally, no wheezes, crackles, or other adventitious sounds CV: RRR, no m/g/c/r Abd: soft, NT, ND, +BS in all four quadrants; no HSM, guarding, ridigity, or rebound tenderness Ext: no edema, well perfused with 2+ pulses, Skin: no rash or lesions noted on observed skin Lymph: no noted LAD  Data Reviewed:  Hemoglobin at discharge 9.0-4/14/21 Hgb 10.8--05/17/20    Assessment/Plan: Mr. Sagan is a 74 y.o. male admitted in April 2021 for GI bleed. His hemoglobin has been improved since discharge and he is scheduled to have a repeat cbc in the next 2 weeks. Normocytic. He denies any NSAID use. Reviewed with patient to contact us with any bleeding in the future. Clinically he is feeling very well.  2. GERD-on PPI twice daily, would consider dose reducing to 1x/day in future if able based on symptoms. Does have hx of barrett's    Will f/up after labs around 06/18/20 - patient has orders for CBC at that time. If doing well likely can do 6 month f/up.      I personally performed the service, non-incident to. (WP)  Laurine Blazer, [facility]

## 2020-06-07 NOTE — Patient Instructions (Addendum)
Continue w/ labs as planned, we will call w/ results. Contact us with any episodes of bleeding, abdominal pain, dark stools, etc.

## 2020-06-16 DIAGNOSIS — D649 Anemia, unspecified: Secondary | ICD-10-CM | POA: Diagnosis not present

## 2020-06-16 DIAGNOSIS — K2270 Barrett's esophagus without dysplasia: Secondary | ICD-10-CM | POA: Diagnosis not present

## 2020-06-16 LAB — CBC
HCT: 32.7 % — ABNORMAL LOW (ref 38.5–50.0)
Hemoglobin: 10.4 g/dL — ABNORMAL LOW (ref 13.2–17.1)
MCH: 26.3 pg — ABNORMAL LOW (ref 27.0–33.0)
MCHC: 31.8 g/dL — ABNORMAL LOW (ref 32.0–36.0)
MCV: 82.8 fL (ref 80.0–100.0)
MPV: 10.7 fL (ref 7.5–12.5)
Platelets: 184 10*3/uL (ref 140–400)
RBC: 3.95 10*6/uL — ABNORMAL LOW (ref 4.20–5.80)
RDW: 13.1 % (ref 11.0–15.0)
WBC: 4.3 10*3/uL (ref 3.8–10.8)

## 2020-06-17 ENCOUNTER — Other Ambulatory Visit (INDEPENDENT_AMBULATORY_CARE_PROVIDER_SITE_OTHER): Payer: Self-pay | Admitting: Internal Medicine

## 2020-06-17 MED ORDER — FERROUS SULFATE 325 (65 FE) MG PO TABS: 325.0000 mg | ORAL_TABLET | Freq: Every day | ORAL | Status: DC

## 2020-06-19 ENCOUNTER — Other Ambulatory Visit (INDEPENDENT_AMBULATORY_CARE_PROVIDER_SITE_OTHER): Payer: Self-pay | Admitting: *Deleted

## 2020-06-19 DIAGNOSIS — D649 Anemia, unspecified: Secondary | ICD-10-CM

## 2020-06-19 NOTE — Progress Notes (Signed)
Labs have been ordered

## 2020-06-21 ENCOUNTER — Other Ambulatory Visit (INDEPENDENT_AMBULATORY_CARE_PROVIDER_SITE_OTHER): Payer: Self-pay | Admitting: *Deleted

## 2020-06-21 ENCOUNTER — Telehealth: Payer: Self-pay | Admitting: Internal Medicine

## 2020-06-21 DIAGNOSIS — I10 Essential (primary) hypertension: Secondary | ICD-10-CM

## 2020-06-21 DIAGNOSIS — D649 Anemia, unspecified: Secondary | ICD-10-CM

## 2020-06-21 MED ORDER — IRBESARTAN 300 MG PO TABS: 300.0000 mg | ORAL_TABLET | Freq: Every day | ORAL | 0 refills | Status: DC

## 2020-06-21 NOTE — Telephone Encounter (Signed)
Returned call to patient he stated he needed a letter from [place] stating he needs to continue Irbesartan for them to pay.Advised I will send message to Dr.Hilty's RN.

## 2020-06-21 NOTE — Telephone Encounter (Signed)
New Message   Pt c/o medication issue:  1. Name of Medication:irbesartan (AVAPRO) 300 MG tablet  2. How are you currently taking this medication (dosage and times per day)? 1 tablet daily   3. Are you having a reaction (difficulty breathing--STAT)? No   4. What is your medication issue? Pt says he is needing something sent to the [facility] for him to get this medication through them because he had to start paying for the medication   Please advise

## 2020-06-22 NOTE — Telephone Encounter (Signed)
Spoke with patient who needs printed Rx to take to [facility] for them to pay for irbesartan  Advised MD is OOO for another 2 weeks  Will see if clinical pharmacy staff is able to assist

## 2020-06-22 NOTE — Telephone Encounter (Signed)
We can not sing for this prescription.  [employer] or doctor covering for Dr Debara Pickett

## 2020-06-23 MED ORDER — IRBESARTAN 300 MG PO TABS: 300.0000 mg | ORAL_TABLET | Freq: Every day | ORAL | 3 refills | Status: DC

## 2020-06-23 NOTE — Telephone Encounter (Signed)
Patient aware Rx has been signed by Sande Rives, PA as Dr. Debara Pickett is not back in the office until July 8

## 2020-06-23 NOTE — Telephone Encounter (Signed)
I was asked to sign paper prescription for refill for Irbesartan 300mg  daily for patient since Dr. Debara Pickett is not in the office. Reviewed last office visit and recent BMET and signed prescription for patient to pick up.

## 2020-07-04 ENCOUNTER — Ambulatory Visit (INDEPENDENT_AMBULATORY_CARE_PROVIDER_SITE_OTHER): Payer: Medicare PPO

## 2020-07-04 ENCOUNTER — Encounter: Payer: Self-pay | Admitting: Podiatry

## 2020-07-04 ENCOUNTER — Other Ambulatory Visit: Payer: Self-pay

## 2020-07-04 ENCOUNTER — Ambulatory Visit: Payer: Medicare PPO | Admitting: Podiatry

## 2020-07-04 DIAGNOSIS — M7752 Other enthesopathy of left foot: Secondary | ICD-10-CM

## 2020-07-04 NOTE — Progress Notes (Signed)
Subjective:  Patient ID: Cory [name], male    DOB: [birth year],  MRN: [id number] HPI Chief Complaint  Patient presents with  . Ankle Pain    Anterior/lateral ankle left - aching x 4-6 weeks, no injury, tried voltaren gel, seems to help some, did hurt this same foot in [place]  . New Patient (Initial Visit)    74 y.o. male presents with the above complaint.   ROS: Denies fever chills nausea vomiting muscle aches pains calf pain back pain chest pain shortness of breath.  Past Medical History:  Diagnosis Date  . Anemia   . Arthritis   . Coronary artery disease    a. 09/2001 PCI/BMS to D1: 2.5 x 15 Express 2 BMS;  b. 2011 Cath: stable anatomy; c. 03/2014 Low risk MV, EF 54%;  d. 09/2016 Inf STEMI/PCI [facility]): LM nl, LAD 72m, D1 nl w/ 90% in 30mm inf branch, LCX nl, RCA 99 (Synergy DES x 4 - 3.5x28 prox, 3.5x83m, 2.5x16d, 2.25x16 to RPDA), EF 55%.  Marland Kitchen GERD (gastroesophageal reflux disease)   . Hyperlipidemia   . Hypertensive heart disease   . Myocardial infarction ([place]) 2017  . Pneumonia   . PTSD (post-traumatic stress disorder)    Past Surgical History:  Procedure Laterality Date  . BIOPSY  04/10/2020   Procedure: BIOPSY;  Surgeon: Rogene Houston, MD;  [facility];  Service: Endoscopy;;  distal, esophageal  . CARDIAC CATHETERIZATION  05/17/2010   left main free of significant lesion; LAD with 60% lesion in mid segment & 60-70% lesion in mid-distal segment with subtotal occlusion at apex & small side branch of diagonal 1 that is subtotally occluded; ramus intermedius with 90% ostial stenosis; L Cfx w/mild luminal irreg; RCA is dominant vessel with diffuse disease, 50% prox lesion & 60% lesion in mid segment & 60% lesion in PDA  . CATARACT EXTRACTION W/PHACO Right 06/07/2015   Procedure: CATARACT EXTRACTION PHACO AND INTRAOCULAR LENS PLACEMENT ([facility]);  Surgeon: Marylynn Pearson, MD;  Location: [place];  Service: Ophthalmology;  Laterality: Right;  . COLONOSCOPY   02/25/2012   Procedure: COLONOSCOPY;  Surgeon: Jamesetta So, MD;  [facility];  Service: Gastroenterology;  Laterality: N/A;  . COLONOSCOPY N/A 04/11/2020   Procedure: COLONOSCOPY;  Surgeon: Rogene Houston, MD;  [facility];  Service: Endoscopy;  Laterality: N/A;  . CORONARY ANGIOPLASTY WITH STENT PLACEMENT  09/2001   Taxus (2.5x88mm) stent to 1st diagonal   . ESOPHAGEAL DILATION N/A 08/14/2016   Procedure: ESOPHAGEAL DILATION;  Surgeon: Rogene Houston, MD;  [facility];  Service: Endoscopy;  Laterality: N/A;  . ESOPHAGOGASTRODUODENOSCOPY  02/25/2012   Procedure: ESOPHAGOGASTRODUODENOSCOPY (EGD);  Surgeon: Jamesetta So, MD;  [facility];  Service: Gastroenterology;  Laterality: N/A;  . ESOPHAGOGASTRODUODENOSCOPY N/A 07/28/2014   Procedure: ESOPHAGOGASTRODUODENOSCOPY (EGD);  Surgeon: Rogene Houston, MD;  [facility];  Service: Endoscopy;  Laterality: N/A;  200-rescheduled to [place] notified pt  . ESOPHAGOGASTRODUODENOSCOPY N/A 08/14/2016   Procedure: ESOPHAGOGASTRODUODENOSCOPY (EGD);  Surgeon: Rogene Houston, MD;  [facility];  Service: Endoscopy;  Laterality: N/A;  100  . ESOPHAGOGASTRODUODENOSCOPY N/A 04/10/2020   Procedure: ESOPHAGOGASTRODUODENOSCOPY (EGD);  Surgeon: Rogene Houston, MD;  [facility];  Service: Endoscopy;  Laterality: N/A;  . EYE SURGERY Bilateral    lasik surgery   . GIVENS CAPSULE STUDY N/A 04/12/2020   Procedure: GIVENS CAPSULE STUDY;  Surgeon: Rogene Houston, MD;  [facility];  Service: Endoscopy;  Laterality: N/A;  . KNEE ARTHROSCOPY WITH MEDIAL MENISECTOMY Left 02/05/2018   Procedure: LEFT KNEE ARTHROSCOPY WITH PARTIAL MEDIAL MENISECTOMY;  Surgeon: Carole Civil, MD;  [facility];  Service: Orthopedics;  Laterality: Left;  . KNEE SURGERY    . LATERAL EPICONDYLE RELEASE Left 01/21/2014   Procedure: Left Tennis Elbow Release with debridement  tendon repair with  reattachment;  Surgeon: Carole Civil, MD;  [facility];  Service: Orthopedics;  Laterality: Left;  . penile implant    . PROSTATE SURGERY    . SHOULDER SURGERY    . TONSILLECTOMY    . TOTAL KNEE ARTHROPLASTY Left 10/19/2018   Procedure: LEFT TOTAL KNEE ARTHROPLASTY;  Surgeon: Vickey Huger, MD;  [facility];  Service: Orthopedics;  Laterality: Left;  with abductor block failed spinal  . TRANSTHORACIC ECHOCARDIOGRAM  03/2010   EF=>55%; LV mildly dilated; LA mod dilated; mild MR & TR; normal RVSP; mild pulm valve regurg    Current Outpatient Medications:  .  acetaminophen (TYLENOL) 325 MG tablet, Take 650 mg by mouth daily as needed for moderate pain or headache., Disp: , Rfl:  .  aspirin EC 81 MG tablet, Take 1 tablet (81 mg total) by mouth daily with breakfast., Disp: 30 tablet, Rfl: 11 .  atorvastatin (LIPITOR) 80 MG tablet, Take 1 tablet (80 mg total) by mouth daily at 6 PM., Disp: 90 tablet, Rfl: 3 .  buPROPion (WELLBUTRIN SR) 100 MG 12 hr tablet, Take 100 mg by mouth daily., Disp: , Rfl:  .  carvedilol (COREG) 3.125 MG tablet, Take 1 tablet (3.125 mg total) by mouth 2 (two) times daily., Disp: 180 tablet, Rfl: 3 .  ferrous sulfate 325 (65 FE) MG tablet, Take 1 tablet (325 mg total) by mouth daily with breakfast., Disp: , Rfl:  .  fluticasone (FLONASE) 50 MCG/ACT nasal spray, Place 1 spray into both nostrils daily as needed for allergies or rhinitis., Disp: , Rfl:  .  hydrOXYzine (ATARAX/VISTARIL) 10 MG tablet, Take 10 mg by mouth at bedtime as needed. 1 tab HS PRN, 1-2 tabs PRN for anxiety., Disp: , Rfl:  .  irbesartan (AVAPRO) 300 MG tablet, Take 1 tablet (300 mg total) by mouth daily., Disp: 90 tablet, Rfl: 3 .  loratadine (CLARITIN) 10 MG tablet, Take 10 mg by mouth daily. , Disp: , Rfl:  .  Magnesium Malate 1250 (141.7 Mg) MG TABS, Take 1,250 mg by mouth every evening. , Disp: , Rfl:  .  methocarbamol (ROBAXIN) 500 MG tablet, Take 1-2 tablets (500-1,000 mg total) by  mouth every 6 (six) hours as needed for muscle spasms., Disp: 60 tablet, Rfl: 0 .  oxyCODONE (OXY IR/ROXICODONE) 5 MG immediate release tablet, Take 1-2 tablets (5-10 mg total) by mouth every 6 (six) hours as needed for moderate pain (pain score 4-6). (Patient not taking: Reported on 06/07/2020), Disp: 50 tablet, Rfl: 0 .  pantoprazole (PROTONIX) 40 MG tablet, Take 1 tablet (40 mg total) by mouth 2 (two) times daily before a meal., Disp: 180 tablet, Rfl: 1 .  traZODone (DESYREL) 100 MG tablet, Take 100 mg by mouth at bedtime. , Disp: , Rfl:  .  venlafaxine (EFFEXOR) 75 MG tablet, Take 75 mg by mouth daily., Disp: , Rfl:   Allergies  Allergen Reactions  . Ace Inhibitors Other (See Comments)    unknown  . Benadryl [Diphenhydramine Hcl] Other (See Comments)    Opposite reaction of medication purpose - hyper  .  Vioxx [Rofecoxib] Other (See Comments)    headache   Review of Systems Objective:  There were no vitals filed for this visit.  General: Well developed, nourished, in no acute distress, alert and oriented x3   Dermatological: Skin is warm, dry and supple bilateral. Nails x 10 are well maintained; remaining integument appears unremarkable at this time. There are no open sores, no preulcerative lesions, no rash or signs of infection present.  Vascular: Dorsalis Pedis artery and Posterior Tibial artery pedal pulses are 2/4 bilateral with immedate capillary fill time. Pedal hair growth present. No varicosities and no lower extremity edema present bilateral.   Neruologic: Grossly intact via light touch bilateral. Vibratory intact via tuning fork bilateral. Protective threshold with Semmes Wienstein monofilament intact to all pedal sites bilateral. Patellar and Achilles deep tendon reflexes 2+ bilateral. No Babinski or clonus noted bilateral.   Musculoskeletal: No gross boney pedal deformities bilateral. No pain, crepitus, or limitation noted with foot and ankle range of motion bilateral.  Muscular strength 5/5 in all groups tested bilateral.  Pain on palpation of the sinus tarsi left.  Pain on end range of motion of the subtalar joint left.  Gait: Unassisted, Nonantalgic.    Radiographs:  Radiographs taken today demonstrate an osseously mature individual no significant osteoarthritic changes ankle joint subtalar joint appears to be relatively normal some cystic changes to the fibula possibly old trauma.  Assessment & Plan:   Assessment: Capsulitis subtalar joint left sinus tarsitis left.  Plan: Discussed etiology pathology conservative versus surgical therapies.  At this point went ahead and injected the subtalar joint today with 20 mg Kenalog 5 mg Marcaine point of maximal tenderness.  Tolerated procedure well without complications.  We discussed appropriate shoe gear and I will follow-up with him on an as-needed basis.     Kayana Thoen T. [place], [place]

## 2020-07-12 ENCOUNTER — Ambulatory Visit (INDEPENDENT_AMBULATORY_CARE_PROVIDER_SITE_OTHER): Payer: Medicare HMO | Admitting: Gastroenterology

## 2020-07-19 DIAGNOSIS — D649 Anemia, unspecified: Secondary | ICD-10-CM | POA: Diagnosis not present

## 2020-07-19 DIAGNOSIS — D513 Other dietary vitamin B12 deficiency anemia: Secondary | ICD-10-CM | POA: Diagnosis not present

## 2020-07-20 ENCOUNTER — Other Ambulatory Visit (INDEPENDENT_AMBULATORY_CARE_PROVIDER_SITE_OTHER): Payer: Self-pay | Admitting: *Deleted

## 2020-07-20 DIAGNOSIS — K922 Gastrointestinal hemorrhage, unspecified: Secondary | ICD-10-CM

## 2020-07-20 DIAGNOSIS — D649 Anemia, unspecified: Secondary | ICD-10-CM

## 2020-07-20 DIAGNOSIS — K2270 Barrett's esophagus without dysplasia: Secondary | ICD-10-CM

## 2020-07-20 LAB — IRON,TIBC AND FERRITIN PANEL
Ferritin: 21 ng/mL — ABNORMAL LOW (ref 24–380)
Iron: 281 ug/dL — ABNORMAL HIGH (ref 50–180)

## 2020-07-20 LAB — FOLATE: Folate: 10.4 ng/mL

## 2020-07-20 LAB — HEMOGLOBIN AND HEMATOCRIT, BLOOD
HCT: 40.5 % (ref 38.5–50.0)
Hemoglobin: 13.1 g/dL — ABNORMAL LOW (ref 13.2–17.1)

## 2020-07-20 LAB — VITAMIN B12: Vitamin B-12: 346 pg/mL (ref 200–1100)

## 2020-07-21 ENCOUNTER — Other Ambulatory Visit (INDEPENDENT_AMBULATORY_CARE_PROVIDER_SITE_OTHER): Payer: Self-pay | Admitting: Internal Medicine

## 2020-07-21 DIAGNOSIS — D508 Other iron deficiency anemias: Secondary | ICD-10-CM

## 2020-07-25 ENCOUNTER — Ambulatory Visit: Payer: Medicare PPO | Admitting: Internal Medicine

## 2020-07-25 ENCOUNTER — Encounter: Payer: Self-pay | Admitting: Internal Medicine

## 2020-07-25 ENCOUNTER — Other Ambulatory Visit: Payer: Self-pay

## 2020-07-25 VITALS — BP 130/83 | HR 54 | Temp 97.4°F | Ht 69.0 in | Wt 178.2 lb

## 2020-07-25 DIAGNOSIS — I255 Ischemic cardiomyopathy: Secondary | ICD-10-CM

## 2020-07-25 DIAGNOSIS — I2119 ST elevation (STEMI) myocardial infarction involving other coronary artery of inferior wall: Secondary | ICD-10-CM

## 2020-07-25 DIAGNOSIS — I10 Essential (primary) hypertension: Secondary | ICD-10-CM

## 2020-07-25 DIAGNOSIS — I2510 Atherosclerotic heart disease of native coronary artery without angina pectoris: Secondary | ICD-10-CM | POA: Diagnosis not present

## 2020-07-25 DIAGNOSIS — D508 Other iron deficiency anemias: Secondary | ICD-10-CM | POA: Diagnosis not present

## 2020-07-25 LAB — IRON: Iron: 154 ug/dL (ref 50–180)

## 2020-07-25 NOTE — Patient Instructions (Signed)

## 2020-07-26 ENCOUNTER — Encounter: Payer: Self-pay | Admitting: Internal Medicine

## 2020-07-26 NOTE — Progress Notes (Signed)
OFFICE NOTE  Chief Complaint:  Routine follow-up  Primary Care Physician: Sharilyn Sites, MD  HPI:  Cory [name] is a 74 year old gentleman, who I have been following for coronary artery disease, status post Taxus stenting in 2002. He also has had some ongoing chronic angina controlled on Imdur 60 mg. He also has PTSD, on a number of medications, but that has been stable, as well as hypertension. Today he has a few different complaints, including some increasing urinary frequency. He does have a history of BPH and is followed at the [place] for this. Sounds like he may have had a TURP procedure recently. He is not on any medications for his prostate. In addition he is describing fatigue. This is a new symptom for him and reports he is sleeping much more regularly and often than he did in the past. He's not exercising as much as he had been before. Of note he is on 3 different antidepressant medications for his PTSD, again managed by the [facility].  He reports fairly stable exertional angina however at times he does get more short of breath with exercise. His last stress test was in 2011.  Cory [name] returns today for follow-up. He is without complaint. His blood pressure appears to be well-controlled. He denies any chest pain and is actually had very little need to take any short acting nitroglycerin since he's been on Imdur. He remains active and has no shortness of breath.  01/08/2017  Cory [name] returns today for follow-up. He was seen in October by Murray Hodgkins, NP, for follow-up of an ST elevation MI. He was vacationing in the [facility] area and presented to [facility] hospital after having ST elevation. He underwent emergent Catheterization and was found to have subtotal occlusion of the right coronary artery. He received 4 drug-eluting stents and was discharged on Effient, aspirin, atenolol and simvastatin. Subsequent to that he is done fairly well without any recurrent chest  pain. He does get a little mild dyspnea with exertion. He did not participate in cardiac rehabilitation. He was noted to be markedly bradycardic and follow-up and his atenolol was discontinued. His simvastatin was discontinued in favor of a highly active statin, namely Lipitor 80 mg daily. A repeat lipid profile was performed on 12/04/2016 showing total cluster 105, triglycerides 63, HDL-C 49, and LDL-C 43. Is is excellent control and I would continue with his current dose. Also of note, his blood pressure is elevated today. He says that it has remained elevated since discontinuing his atenolol.  01/24/2017  Cory [name] returns for follow-up of his echo. Recently that demonstrated an EF of 45% which is reduced from his prior studies. This is related to his recent STEMI. Hopefully he'll have some recovery of his LV function in time. I did switch him from losartan 100 up to valsartan 320 since his blood pressure was not at goal. Now his blood pressure is much improved at 130/82. He feels really well other than he has some pain behind his left knee. He reports to stiffness and difficulty in extending it. He does not recall injuring it. He wondered if this is related to his cholesterol medicine. Of note he is been on a statin medication now for several months.  08/07/2017  Cory [name] was seen today in follow-up. Over the last few months she's had no new complaints. He denies any chest pain or worsening shortness of breath. He was seen about his knee pain and told he  has arthritis. His EF was 45% on echo and recently was subjected to a valsartan and recall. He was switched over to irbesartan and seems to be tolerating that well. Blood pressure is reasonably well controlled. He should be on dual antiplatelet therapy and at least until October 2018.  11/18/2017  Cory [name] returns today for follow-up.  Overall is without complaints.  EKG shows sinus rhythm at 60.  He is now 1 year since his ST elevation MI.  We had  discussed possibly discontinuing his Effient.  He denies any further chest pain.  He remains physically active.  05/21/2018  Cory [name] was seen today in follow-up.  He seems to be doing really well.  He denies any chest pain or worsening shortness of breath.  He had a repeat echo that unfortunately does not show any improvement in LVEF which is still 45% with inferior hypokinesis.  The LVEF however has not worsened.  He is on irbesartan, but not is not on a beta-blocker.  He takes aspirin monotherapy.  He had a lipid profile drawn this morning however the results are pending.   07/25/2020  Cory [name] is seen today in follow-up.  Overall he is doing well without any new chest pain or worsening shortness of breath.  We did repeat an echocardiogram and unfortunately shows no significant improvement in LVEF and remains at 40 to 45% with pseudonormalization.  There is some inferior akinesis and inferolateral akinesis suggesting of scar.  I suspect his LVEF will not improve any further despite medical therapy due to the scar.  There is moderate left atrial enlargement.  He remains asymptomatic with NYHA class I-II symptoms.  EKG does show inferior Q waves and a sinus bradycardia 54.  Blood pressures well controlled.  His most recent LDL was 74.  PMHx:  Past Medical History:  Diagnosis Date  . Anemia   . Arthritis   . Coronary artery disease    a. 09/2001 PCI/BMS to D1: 2.5 x 15 Express 2 BMS;  b. 2011 Cath: stable anatomy; c. 03/2014 Low risk MV, EF 54%;  d. 09/2016 Inf STEMI/PCI [facility]): LM nl, LAD 38m, D1 nl w/ 90% in 41mm inf branch, LCX nl, RCA 99 (Synergy DES x 4 - 3.5x28 prox, 3.5x65m, 2.5x16d, 2.25x16 to RPDA), EF 55%.  Marland Kitchen GERD (gastroesophageal reflux disease)   . Hyperlipidemia   . Hypertensive heart disease   . Myocardial infarction ([place]) 2017  . Pneumonia   . PTSD (post-traumatic stress disorder)     Past Surgical History:  Procedure Laterality Date  . BIOPSY   04/10/2020   Procedure: BIOPSY;  Surgeon: Rogene Houston, MD;  [facility];  Service: Endoscopy;;  distal, esophageal  . CARDIAC CATHETERIZATION  05/17/2010   left main free of significant lesion; LAD with 60% lesion in mid segment & 60-70% lesion in mid-distal segment with subtotal occlusion at apex & small side branch of diagonal 1 that is subtotally occluded; ramus intermedius with 90% ostial stenosis; L Cfx w/mild luminal irreg; RCA is dominant vessel with diffuse disease, 50% prox lesion & 60% lesion in mid segment & 60% lesion in PDA  . CATARACT EXTRACTION W/PHACO Right 06/07/2015   Procedure: CATARACT EXTRACTION PHACO AND INTRAOCULAR LENS PLACEMENT (IOC);  Surgeon: Marylynn Pearson, MD;  Location: [place];  Service: Ophthalmology;  Laterality: Right;  . COLONOSCOPY  02/25/2012   Procedure: COLONOSCOPY;  Surgeon: Jamesetta So, MD;  [facility];  Service: Gastroenterology;  Laterality: N/A;  . COLONOSCOPY N/A 04/11/2020   Procedure: COLONOSCOPY;  Surgeon: Rogene Houston, MD;  [facility];  Service: Endoscopy;  Laterality: N/A;  . CORONARY ANGIOPLASTY WITH STENT PLACEMENT  09/2001   Taxus (2.5x81mm) stent to 1st diagonal   . ESOPHAGEAL DILATION N/A 08/14/2016   Procedure: ESOPHAGEAL DILATION;  Surgeon: Rogene Houston, MD;  [facility];  Service: Endoscopy;  Laterality: N/A;  . ESOPHAGOGASTRODUODENOSCOPY  02/25/2012   Procedure: ESOPHAGOGASTRODUODENOSCOPY (EGD);  Surgeon: Jamesetta So, MD;  [facility];  Service: Gastroenterology;  Laterality: N/A;  . ESOPHAGOGASTRODUODENOSCOPY N/A 07/28/2014   Procedure: ESOPHAGOGASTRODUODENOSCOPY (EGD);  Surgeon: Rogene Houston, MD;  [facility];  Service: Endoscopy;  Laterality: N/A;  200-rescheduled to [place] notified pt  . ESOPHAGOGASTRODUODENOSCOPY N/A 08/14/2016   Procedure: ESOPHAGOGASTRODUODENOSCOPY (EGD);  Surgeon: Rogene Houston, MD;  [facility];  Service: Endoscopy;   Laterality: N/A;  100  . ESOPHAGOGASTRODUODENOSCOPY N/A 04/10/2020   Procedure: ESOPHAGOGASTRODUODENOSCOPY (EGD);  Surgeon: Rogene Houston, MD;  [facility];  Service: Endoscopy;  Laterality: N/A;  . EYE SURGERY Bilateral    lasik surgery   . GIVENS CAPSULE STUDY N/A 04/12/2020   Procedure: GIVENS CAPSULE STUDY;  Surgeon: Rogene Houston, MD;  [facility];  Service: Endoscopy;  Laterality: N/A;  . KNEE ARTHROSCOPY WITH MEDIAL MENISECTOMY Left 02/05/2018   Procedure: LEFT KNEE ARTHROSCOPY WITH PARTIAL MEDIAL MENISECTOMY;  Surgeon: Carole Civil, MD;  [facility];  Service: Orthopedics;  Laterality: Left;  . KNEE SURGERY    . LATERAL EPICONDYLE RELEASE Left 01/21/2014   Procedure: Left Tennis Elbow Release with debridement  tendon repair with reattachment;  Surgeon: Carole Civil, MD;  [facility];  Service: Orthopedics;  Laterality: Left;  . penile implant    . PROSTATE SURGERY    . SHOULDER SURGERY    . TONSILLECTOMY    . TOTAL KNEE ARTHROPLASTY Left 10/19/2018   Procedure: LEFT TOTAL KNEE ARTHROPLASTY;  Surgeon: Vickey Huger, MD;  [facility];  Service: Orthopedics;  Laterality: Left;  with abductor block failed spinal  . TRANSTHORACIC ECHOCARDIOGRAM  03/2010   EF=>55%; LV mildly dilated; LA mod dilated; mild MR & TR; normal RVSP; mild pulm valve regurg    FAMHx:  Family History  Problem Relation Age of Onset  . Colitis Maternal Aunt     SOCHx:   reports that he has never smoked. He has never used smokeless tobacco. He reports that he does not drink alcohol and does not use drugs.  ALLERGIES:  Allergies  Allergen Reactions  . Ace Inhibitors Other (See Comments)    unknown  . Benadryl [Diphenhydramine Hcl] Other (See Comments)    Opposite reaction of medication purpose - hyper  . Vioxx [Rofecoxib] Other (See Comments)    headache    ROS: Pertinent items noted in HPI and remainder of comprehensive ROS otherwise  negative.  HOME MEDS: Current Outpatient Medications  Medication Sig Dispense Refill  . acetaminophen (TYLENOL) 325 MG tablet Take 650 mg by mouth daily as needed for moderate pain or headache.    Marland Kitchen aspirin EC 81 MG tablet Take 1 tablet (81 mg total) by mouth daily with breakfast. 30 tablet 11  . atorvastatin (LIPITOR) 80 MG tablet Take 1 tablet (80 mg total) by mouth daily at 6 PM. 90 tablet 3  . buPROPion (WELLBUTRIN SR) 100 MG 12 hr tablet Take 100 mg by mouth daily.    Marland Kitchen  ferrous sulfate 325 (65 FE) MG tablet Take 1 tablet (325 mg total) by mouth daily with breakfast.    . fluticasone (FLONASE) 50 MCG/ACT nasal spray Place 1 spray into both nostrils daily as needed for allergies or rhinitis.    . hydrOXYzine (ATARAX/VISTARIL) 10 MG tablet Take 10 mg by mouth at bedtime as needed. 1 tab HS PRN, 1-2 tabs PRN for anxiety.    . irbesartan (AVAPRO) 300 MG tablet Take 1 tablet (300 mg total) by mouth daily. 90 tablet 3  . loratadine (CLARITIN) 10 MG tablet Take 10 mg by mouth daily.     . Magnesium Malate 1250 (141.7 Mg) MG TABS Take 1,250 mg by mouth every evening.     . methocarbamol (ROBAXIN) 500 MG tablet Take 1-2 tablets (500-1,000 mg total) by mouth every 6 (six) hours as needed for muscle spasms. 60 tablet 0  . oxyCODONE (OXY IR/ROXICODONE) 5 MG immediate release tablet Take 1-2 tablets (5-10 mg total) by mouth every 6 (six) hours as needed for moderate pain (pain score 4-6). 50 tablet 0  . pantoprazole (PROTONIX) 40 MG tablet Take 1 tablet (40 mg total) by mouth 2 (two) times daily before a meal. 180 tablet 1  . traZODone (DESYREL) 100 MG tablet Take 100 mg by mouth at bedtime.     Marland Kitchen venlafaxine (EFFEXOR) 75 MG tablet Take 75 mg by mouth daily.    . carvedilol (COREG) 3.125 MG tablet Take 1 tablet (3.125 mg total) by mouth 2 (two) times daily. 180 tablet 3   No current facility-administered medications for this visit.    LABS/IMAGING: Results for orders placed or performed in visit on  07/21/20 (from the past 48 hour(s))  Iron     Status: None   Collection Time: 07/25/20 10:21 AM  Result Value Ref Range   Iron 154 50 - 180 mcg/dL   No results found.  VITALS: BP (!) 130/83   Pulse 54   Temp (!) 97.4 F (36.3 C)   Ht 5\' 9"  (1.753 m)   Wt 178 lb 3.2 oz (80.8 kg)   SpO2 96%   BMI 26.32 kg/m   EXAM: General appearance: alert and no distress Neck: no carotid bruit, no JVD and thyroid not enlarged, symmetric, no tenderness/mass/nodules Lungs: clear to auscultation bilaterally Heart: regular rate and rhythm, S1, S2 normal, no murmur, click, rub or gallop Abdomen: soft, non-tender; bowel sounds normal; no masses,  no organomegaly Extremities: extremities normal, atraumatic, no cyanosis or edema Pulses: 2+ and symmetric Skin: Skin color, texture, turgor normal. No rashes or lesions Neurologic: Grossly normal Psych: Pleasant  EKG: Sinus bradycardia 54, inferior infarct pattern-personally reviewed  ASSESSMENT: 1. Inferior STEMI, status post 4 DES to the RCA (2017)-[place] 2. LVEF 45% (8/82020) 3. Coronary artery disease status post Taxus DES to the first diagonal and 2002 4. PTSD 5. Hypertension 6. Dyslipidemia  PLAN: 1.   Cory [name] continues to be asymptomatic.  His LVEF has not improved much since his last echo and remains around 45%.  This is likely due to his inferior and inferolateral scar.  Blood pressure is well controlled on irbesartan and low-dose beta-blocker.  Heart rate is now in the 50s.  His last LDL was 74.  Plan follow-up with me annually or sooner as necessary.  [name], MD, [facility], [place] Director of the [facility]  [facility] Attending Cardiologist  Direct Dial: [phone]  Fax:  [phone]  Website:  [email or web address]  Nadean Corwin Ladelle Teodoro 07/26/2020, 7:55 AM

## 2020-08-03 ENCOUNTER — Encounter (INDEPENDENT_AMBULATORY_CARE_PROVIDER_SITE_OTHER): Payer: Self-pay | Admitting: Gastroenterology

## 2020-08-03 ENCOUNTER — Ambulatory Visit (INDEPENDENT_AMBULATORY_CARE_PROVIDER_SITE_OTHER): Payer: Medicare PPO | Admitting: Gastroenterology

## 2020-08-03 ENCOUNTER — Other Ambulatory Visit: Payer: Self-pay

## 2020-08-03 VITALS — BP 122/72 | HR 76 | Temp 98.9°F | Ht 69.0 in | Wt 176.6 lb

## 2020-08-03 DIAGNOSIS — D508 Other iron deficiency anemias: Secondary | ICD-10-CM | POA: Diagnosis not present

## 2020-08-03 NOTE — Progress Notes (Signed)
Patient profile: Cory [name] is a 74 y.o. male seen for evaluation of 05/2020.   History of Present Illness: Cory [name] is seen today for follow-up. Last seen June 2021.  He reports clinically doing well.  He is typically having a bowel movement daily.  He denies any constipation, diarrhea, melena, rectal bleeding.  Stools are chronically dark from iron but otherwise denies obvious blood in stool.  Currently on Protonix 40 mg twice a day. He does note since his last visit having brief timeframe where he was out of Protonix and developed significant esophageal burning symptom when off PPI.  Resolved when he restarted PPI.  He denies any nausea vomiting or dysphagia.  Appetite good.  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  Last Colonoscopy: 03/2020 Diverticulosis in the sigmoid colon and in the descending colon. - No specimens collected.   Last Endoscopy: 03/2020-- Normal hypopharynx. - Esophageal mucosal changes secondary to established short-segment Barrett's disease. Biopsied. - Z-line regular, 39 cm from the incisors. - 2 cm hiatal hernia. - Normal stomach. - Normal duodenal bulb, second portion of theduodenum and third portion of the duodenum. 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 No other abnormalities noted to small bowel mucosa.   Past Medical History:  Past Medical History:  Diagnosis Date  . Anemia   . Arthritis   . Coronary artery disease    a. 09/2001 PCI/BMS to D1: 2.5 x 15 Express 2 BMS;  b. 2011 Cath: stable anatomy; c. 03/2014 Low risk MV, EF 54%;  d. 09/2016 Inf STEMI/PCI [facility]): LM nl, LAD 24m, D1 nl w/ 90% in 18mm inf branch, LCX nl, RCA 99 (Synergy DES x 4 -  3.5x28 prox, 3.5x64m, 2.5x16d, 2.25x16 to RPDA), EF 55%.  Marland Kitchen GERD (gastroesophageal reflux disease)   . Hyperlipidemia   . Hypertensive heart disease   . Myocardial infarction ([place]) 2017  . Pneumonia   . PTSD (post-traumatic stress disorder)     Problem List: Patient Active Problem List   Diagnosis Date Noted  . Acute GI bleeding 04/11/2020  . GI bleed 04/10/2020  . Low back pain 01/13/2020  . 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 S/P left knee arthroscopy 02/05/18 02/12/2018  . Ischemic cardiomyopathy 11/18/2017  . Acute pain of left knee 01/24/2017  . NSTEMI (non-ST elevated myocardial infarction) ([place]) 01/08/2017  . ST elevation myocardial infarction (STEMI) of inferior wall, subsequent episode of care ([place]) 01/08/2017  . Mixed hyperlipidemia 01/08/2017  . Sinus bradycardia 01/08/2017  . Left tennis elbow 11/08/2013  . Stable angina ([place]) 11/08/2013  . CAD in native artery 11/08/2013  . PTSD (post-traumatic stress disorder) 11/08/2013  . Essential hypertension 11/08/2013  . Dyslipidemia 11/08/2013  . Fracture of phalanx of thumb 12/03/2011    Past Surgical History: Past Surgical History:  Procedure Laterality Date  . BIOPSY  04/10/2020   Procedure: BIOPSY;  Surgeon: Rogene Houston, MD;  [facility];  Service: Endoscopy;;  distal, esophageal  . CARDIAC CATHETERIZATION  05/17/2010   left main free of significant lesion; LAD with 60% lesion in mid segment & 60-70% lesion in mid-distal segment with subtotal occlusion at apex & small side branch of diagonal 1 that is subtotally occluded; ramus intermedius with 90% ostial stenosis;  L Cfx w/mild luminal irreg; RCA is dominant vessel with diffuse disease, 50% prox lesion & 60% lesion in mid segment & 60% lesion in PDA  . CATARACT EXTRACTION W/PHACO Right 06/07/2015   Procedure: CATARACT EXTRACTION PHACO AND INTRAOCULAR LENS PLACEMENT (IOC);  Surgeon: Marylynn Pearson, MD;  Location: [place];  Service: Ophthalmology;  Laterality:  Right;  . COLONOSCOPY  02/25/2012   Procedure: COLONOSCOPY;  Surgeon: Jamesetta So, MD;  [facility];  Service: Gastroenterology;  Laterality: N/A;  . COLONOSCOPY N/A 04/11/2020   Procedure: COLONOSCOPY;  Surgeon: Rogene Houston, MD;  [facility];  Service: Endoscopy;  Laterality: N/A;  . CORONARY ANGIOPLASTY WITH STENT PLACEMENT  09/2001   Taxus (2.5x32mm) stent to 1st diagonal   . ESOPHAGEAL DILATION N/A 08/14/2016   Procedure: ESOPHAGEAL DILATION;  Surgeon: Rogene Houston, MD;  [facility];  Service: Endoscopy;  Laterality: N/A;  . ESOPHAGOGASTRODUODENOSCOPY  02/25/2012   Procedure: ESOPHAGOGASTRODUODENOSCOPY (EGD);  Surgeon: Jamesetta So, MD;  [facility];  Service: Gastroenterology;  Laterality: N/A;  . ESOPHAGOGASTRODUODENOSCOPY N/A 07/28/2014   Procedure: ESOPHAGOGASTRODUODENOSCOPY (EGD);  Surgeon: Rogene Houston, MD;  [facility];  Service: Endoscopy;  Laterality: N/A;  200-rescheduled to [place] notified pt  . ESOPHAGOGASTRODUODENOSCOPY N/A 08/14/2016   Procedure: ESOPHAGOGASTRODUODENOSCOPY (EGD);  Surgeon: Rogene Houston, MD;  [facility];  Service: Endoscopy;  Laterality: N/A;  100  . ESOPHAGOGASTRODUODENOSCOPY N/A 04/10/2020   Procedure: ESOPHAGOGASTRODUODENOSCOPY (EGD);  Surgeon: Rogene Houston, MD;  [facility];  Service: Endoscopy;  Laterality: N/A;  . EYE SURGERY Bilateral    lasik surgery   . GIVENS CAPSULE STUDY N/A 04/12/2020   Procedure: GIVENS CAPSULE STUDY;  Surgeon: Rogene Houston, MD;  [facility];  Service: Endoscopy;  Laterality: N/A;  . KNEE ARTHROSCOPY WITH MEDIAL MENISECTOMY Left 02/05/2018   Procedure: LEFT KNEE ARTHROSCOPY WITH PARTIAL MEDIAL MENISECTOMY;  Surgeon: Carole Civil, MD;  [facility];  Service: Orthopedics;  Laterality: Left;  . KNEE SURGERY    . LATERAL EPICONDYLE RELEASE Left 01/21/2014   Procedure: Left Tennis Elbow Release with  debridement  tendon repair with reattachment;  Surgeon: Carole Civil, MD;  [facility];  Service: Orthopedics;  Laterality: Left;  . penile implant    . PROSTATE SURGERY    . SHOULDER SURGERY    . TONSILLECTOMY    . TOTAL KNEE ARTHROPLASTY Left 10/19/2018   Procedure: LEFT TOTAL KNEE ARTHROPLASTY;  Surgeon: Vickey Huger, MD;  [facility];  Service: Orthopedics;  Laterality: Left;  with abductor block failed spinal  . TRANSTHORACIC ECHOCARDIOGRAM  03/2010   EF=>55%; LV mildly dilated; LA mod dilated; mild MR & TR; normal RVSP; mild pulm valve regurg    Allergies: Allergies  Allergen Reactions  . Ace Inhibitors Other (See Comments)    unknown  . Benadryl [Diphenhydramine Hcl] Other (See Comments)    Opposite reaction of medication purpose - hyper  . Vioxx [Rofecoxib] Other (See Comments)    headache    Family History: family history includes Colitis in his maternal aunt.    Social History:   reports that he has never smoked. He has never used smokeless tobacco. He reports that he does not drink alcohol and does not use drugs.   Review of Systems: Constitutional: Weight is stable.  Eyes: No changes in vision. ENT: No oral lesions, sore throat.  GI: see HPI.  Heme/Lymph: No easy  bruising.  CV: No chest pain.  GU: No hematuria.  Integumentary: No rashes.  Neuro: No headaches.  Psych: No depression/anxiety.  Endocrine: No heat/cold intolerance.  Allergic/Immunologic: No urticaria.  Resp: No cough, SOB.  Musculoskeletal: No joint swelling.    Physical Examination: BP 122/72 (BP Location: Right Arm, Patient Position: Sitting, Cuff Size: Normal)   Pulse 76   Temp 98.9 F (37.2 C) (Oral)   Ht 5\' 9"  (1.753 m)   Wt 176 lb 9.6 oz (80.1 kg)   BMI 26.08 kg/m  Gen: NAD, alert and oriented x 4 HEENT: PEERLA, EOMI, Neck: supple, no JVD or thyromegaly Chest: CTA bilaterally, no wheezes, crackles, or other adventitious sounds CV: RRR, no m/g/c/r Abd: soft, NT, ND,  +BS in all four quadrants; no HSM, guarding, ridigity, or rebound tenderness Ext: no edema, well perfused with 2+ pulses, Skin: no rash or lesions noted Lymph: no LAD  Data: Lab Results  Component Value Date   WBC 4.3 06/16/2020   HGB 13.1 (L) 07/19/2020   HCT 40.5 07/19/2020   MCV 82.8 06/16/2020   PLT 184 06/16/2020   No results for input(s): HGB in the last 168 hours. Lab Results  Component Value Date   NA 139 04/11/2020   K 3.9 04/11/2020   CL 107 04/11/2020   CO2 26 04/11/2020   BUN 14 04/11/2020   CREATININE 0.83 04/11/2020   Lab Results  Component Value Date   ALT 16 04/10/2020   AST 14 (L) 04/10/2020   ALKPHOS 58 04/10/2020   BILITOT 0.7 04/10/2020   No results for input(s): APTT, INR, PTT in the last 168 hours.    Data Reviewed:   07/19/20-hemoglobin improved to 13.1, iron 281, ferritin 21, B12 346, folate 10.4   Assessment/Plan: Mr. Kueker is a 74 y.o. male seen for follow-up, he was hospitalized April 2021 for GI bleed, he has undergone work-up with EGD colonoscopy and capsule study. Possible etiology was colonic diverticulosis, also had single diverticulum on capsule.  1.  IDA-hemoglobin has improved to 13.1. Given his ferritin is still low at 21 will continue iron supplement and repeat labs in about 3 months.  Will notify me if he develops any obvious GI bleeding in interim.  2.GERD-recommended at last office visit to decrease to once a day Protonix, he has continued twice a day.  He does have history of Barrett's and symptomatic off PPI. Readdress dose decrease to once a day at f/up. Continue diet modifications.  Abayomi was seen today for follow-up.  Diagnoses and all orders for this visit:  Other iron deficiency anemia -     CBC with Differential; Future -     Fe+TIBC+Fer; Future     F/up in office 6 months, iron labs in 3 months    I personally performed the service, non-incident to. (WP)  Laurine Blazer, [facility]

## 2020-08-03 NOTE — Patient Instructions (Signed)
We are checking labs in 3 months

## 2020-09-08 ENCOUNTER — Other Ambulatory Visit (INDEPENDENT_AMBULATORY_CARE_PROVIDER_SITE_OTHER): Payer: Self-pay | Admitting: *Deleted

## 2020-09-08 DIAGNOSIS — D649 Anemia, unspecified: Secondary | ICD-10-CM

## 2020-09-08 DIAGNOSIS — K922 Gastrointestinal hemorrhage, unspecified: Secondary | ICD-10-CM

## 2020-09-08 DIAGNOSIS — K2270 Barrett's esophagus without dysplasia: Secondary | ICD-10-CM

## 2020-09-28 DIAGNOSIS — K219 Gastro-esophageal reflux disease without esophagitis: Secondary | ICD-10-CM | POA: Diagnosis not present

## 2020-09-28 DIAGNOSIS — I10 Essential (primary) hypertension: Secondary | ICD-10-CM | POA: Diagnosis not present

## 2020-09-28 DIAGNOSIS — E7849 Other hyperlipidemia: Secondary | ICD-10-CM | POA: Diagnosis not present

## 2020-10-16 ENCOUNTER — Other Ambulatory Visit: Payer: Self-pay | Admitting: Student

## 2020-10-16 ENCOUNTER — Other Ambulatory Visit: Payer: Self-pay | Admitting: Internal Medicine

## 2020-10-16 DIAGNOSIS — I10 Essential (primary) hypertension: Secondary | ICD-10-CM

## 2020-10-16 MED ORDER — IRBESARTAN 300 MG PO TABS: 300.0000 mg | ORAL_TABLET | Freq: Every day | ORAL | 1 refills | Status: DC

## 2020-10-16 NOTE — Telephone Encounter (Signed)
    *  STAT* If patient is at the pharmacy, call can be transferred to refill team.   1. Which medications need to be refilled? (please list name of each medication and dose if known)   irbesartan (AVAPRO) 300 MG tablet    2. Which pharmacy/location (including street and city if local pharmacy) is medication to be sent to?[place], [place] [id number] [place] [address]  3. Do they need a 30 day or 90 day supply? 90 days

## 2020-10-19 DIAGNOSIS — K922 Gastrointestinal hemorrhage, unspecified: Secondary | ICD-10-CM | POA: Diagnosis not present

## 2020-10-19 DIAGNOSIS — K2270 Barrett's esophagus without dysplasia: Secondary | ICD-10-CM | POA: Diagnosis not present

## 2020-10-19 DIAGNOSIS — D649 Anemia, unspecified: Secondary | ICD-10-CM | POA: Diagnosis not present

## 2020-10-20 DIAGNOSIS — Z23 Encounter for immunization: Secondary | ICD-10-CM | POA: Diagnosis not present

## 2020-10-20 LAB — CBC
HCT: 42.6 % (ref 38.5–50.0)
Hemoglobin: 14.7 g/dL (ref 13.2–17.1)
MCH: 30.9 pg (ref 27.0–33.0)
MCHC: 34.5 g/dL (ref 32.0–36.0)
MCV: 89.5 fL (ref 80.0–100.0)
MPV: 10.2 fL (ref 7.5–12.5)
Platelets: 201 10*3/uL (ref 140–400)
RBC: 4.76 10*6/uL (ref 4.20–5.80)
RDW: 14.3 % (ref 11.0–15.0)
WBC: 6.7 10*3/uL (ref 3.8–10.8)

## 2020-10-20 LAB — IRON,TIBC AND FERRITIN PANEL
%SAT: 22 % (calc) (ref 20–48)
Ferritin: 39 ng/mL (ref 24–380)
Iron: 74 ug/dL (ref 50–180)
TIBC: 344 mcg/dL (calc) (ref 250–425)

## 2020-10-28 DIAGNOSIS — K219 Gastro-esophageal reflux disease without esophagitis: Secondary | ICD-10-CM | POA: Diagnosis not present

## 2020-10-28 DIAGNOSIS — E7849 Other hyperlipidemia: Secondary | ICD-10-CM | POA: Diagnosis not present

## 2020-10-28 DIAGNOSIS — I10 Essential (primary) hypertension: Secondary | ICD-10-CM | POA: Diagnosis not present

## 2020-11-28 DIAGNOSIS — I10 Essential (primary) hypertension: Secondary | ICD-10-CM | POA: Diagnosis not present

## 2020-11-28 DIAGNOSIS — K219 Gastro-esophageal reflux disease without esophagitis: Secondary | ICD-10-CM | POA: Diagnosis not present

## 2020-11-28 DIAGNOSIS — E7849 Other hyperlipidemia: Secondary | ICD-10-CM | POA: Diagnosis not present

## 2021-01-29 DIAGNOSIS — K219 Gastro-esophageal reflux disease without esophagitis: Secondary | ICD-10-CM | POA: Diagnosis not present

## 2021-01-29 DIAGNOSIS — E7849 Other hyperlipidemia: Secondary | ICD-10-CM | POA: Diagnosis not present

## 2021-01-29 DIAGNOSIS — I10 Essential (primary) hypertension: Secondary | ICD-10-CM | POA: Diagnosis not present

## 2021-02-20 DIAGNOSIS — R7309 Other abnormal glucose: Secondary | ICD-10-CM | POA: Diagnosis not present

## 2021-03-28 DIAGNOSIS — E7849 Other hyperlipidemia: Secondary | ICD-10-CM | POA: Diagnosis not present

## 2021-03-28 DIAGNOSIS — K219 Gastro-esophageal reflux disease without esophagitis: Secondary | ICD-10-CM | POA: Diagnosis not present

## 2021-03-28 DIAGNOSIS — I10 Essential (primary) hypertension: Secondary | ICD-10-CM | POA: Diagnosis not present

## 2021-04-19 ENCOUNTER — Other Ambulatory Visit: Payer: Self-pay

## 2021-04-19 ENCOUNTER — Encounter: Payer: Self-pay | Admitting: Podiatry

## 2021-04-19 ENCOUNTER — Ambulatory Visit: Payer: Medicare HMO | Admitting: Podiatry

## 2021-04-19 DIAGNOSIS — M7752 Other enthesopathy of left foot: Secondary | ICD-10-CM

## 2021-04-19 MED ORDER — TRIAMCINOLONE ACETONIDE 40 MG/ML IJ SUSP
20.0000 mg | Freq: Once | INTRAMUSCULAR | Status: AC
  Administered 2021-04-19: 20 mg

## 2021-04-20 DIAGNOSIS — I10 Essential (primary) hypertension: Secondary | ICD-10-CM | POA: Diagnosis not present

## 2021-04-20 DIAGNOSIS — Z6826 Body mass index (BMI) 26.0-26.9, adult: Secondary | ICD-10-CM | POA: Diagnosis not present

## 2021-04-20 DIAGNOSIS — I2510 Atherosclerotic heart disease of native coronary artery without angina pectoris: Secondary | ICD-10-CM | POA: Diagnosis not present

## 2021-04-20 DIAGNOSIS — Z0001 Encounter for general adult medical examination with abnormal findings: Secondary | ICD-10-CM | POA: Diagnosis not present

## 2021-04-20 DIAGNOSIS — D509 Iron deficiency anemia, unspecified: Secondary | ICD-10-CM | POA: Diagnosis not present

## 2021-04-20 DIAGNOSIS — Z1331 Encounter for screening for depression: Secondary | ICD-10-CM | POA: Diagnosis not present

## 2021-04-22 NOTE — Progress Notes (Signed)
He presents today after having not seen him since July of last year complaining of painful ankle as he points to the left subtalar joint states that it still hurts in that same spot got better for quite a while and then it has become painful once again.  Objective: Vital signs are stable he is alert oriented x3 pulses are palpable.  No edema erythema cellulitis drainage or odor he has good range of motion of the subtalar joint though painful on end range of motion.  Assessment: Subtalar joint capsulitis left.  Plan: Discussed etiology pathology conservative surgical therapy secondary to osteoarthritic changes and capsulitis orthotics are recommended and also reinjected today after sterile Betadine skin prep 20 mg Kenalog 5 mg Marcaine point maximal tenderness.  Tolerated procedure well and I will follow-up with him once his orthotics come in.

## 2021-04-24 ENCOUNTER — Other Ambulatory Visit: Payer: Self-pay

## 2021-04-24 ENCOUNTER — Ambulatory Visit (INDEPENDENT_AMBULATORY_CARE_PROVIDER_SITE_OTHER): Payer: Medicare HMO | Admitting: Podiatry

## 2021-04-24 DIAGNOSIS — M7751 Other enthesopathy of right foot: Secondary | ICD-10-CM

## 2021-04-24 DIAGNOSIS — M7752 Other enthesopathy of left foot: Secondary | ICD-10-CM

## 2021-04-24 NOTE — Progress Notes (Signed)
Patient presents to be casted for orthotics.  A foam impression was casted for his right and left foot.  Patient is a size 9  Patient will be contacted when the orthotics are ready for pick up.

## 2021-05-14 DIAGNOSIS — L821 Other seborrheic keratosis: Secondary | ICD-10-CM | POA: Diagnosis not present

## 2021-05-14 DIAGNOSIS — L570 Actinic keratosis: Secondary | ICD-10-CM | POA: Diagnosis not present

## 2021-05-14 DIAGNOSIS — Z1283 Encounter for screening for malignant neoplasm of skin: Secondary | ICD-10-CM | POA: Diagnosis not present

## 2021-05-15 ENCOUNTER — Ambulatory Visit (INDEPENDENT_AMBULATORY_CARE_PROVIDER_SITE_OTHER): Payer: Medicare HMO | Admitting: *Deleted

## 2021-05-15 ENCOUNTER — Other Ambulatory Visit: Payer: Self-pay

## 2021-05-15 DIAGNOSIS — M7752 Other enthesopathy of left foot: Secondary | ICD-10-CM

## 2021-05-15 NOTE — Progress Notes (Signed)
Patient presents today to pick up custom molded foot orthotics, diagnosed with capsulitis by Dr. Milinda Pointer.   Orthotics were dispensed and fit was satisfactory. Reviewed instructions for break-in and wear. Written instructions given to patient.  Patient will follow up as needed.   [facility] - order # [id number]

## 2021-05-21 DIAGNOSIS — Z96652 Presence of left artificial knee joint: Secondary | ICD-10-CM | POA: Diagnosis not present

## 2021-05-29 DIAGNOSIS — K219 Gastro-esophageal reflux disease without esophagitis: Secondary | ICD-10-CM | POA: Diagnosis not present

## 2021-05-29 DIAGNOSIS — I10 Essential (primary) hypertension: Secondary | ICD-10-CM | POA: Diagnosis not present

## 2021-05-29 DIAGNOSIS — E7849 Other hyperlipidemia: Secondary | ICD-10-CM | POA: Diagnosis not present

## 2021-06-07 ENCOUNTER — Other Ambulatory Visit: Payer: Self-pay

## 2021-06-07 ENCOUNTER — Encounter (INDEPENDENT_AMBULATORY_CARE_PROVIDER_SITE_OTHER): Payer: Self-pay | Admitting: Gastroenterology

## 2021-06-07 ENCOUNTER — Ambulatory Visit (INDEPENDENT_AMBULATORY_CARE_PROVIDER_SITE_OTHER): Payer: Medicare PPO | Admitting: Gastroenterology

## 2021-06-07 ENCOUNTER — Ambulatory Visit (INDEPENDENT_AMBULATORY_CARE_PROVIDER_SITE_OTHER): Payer: No Typology Code available for payment source | Admitting: Gastroenterology

## 2021-06-07 DIAGNOSIS — K2270 Barrett's esophagus without dysplasia: Secondary | ICD-10-CM

## 2021-06-07 DIAGNOSIS — K219 Gastro-esophageal reflux disease without esophagitis: Secondary | ICD-10-CM

## 2021-06-07 DIAGNOSIS — D509 Iron deficiency anemia, unspecified: Secondary | ICD-10-CM | POA: Diagnosis not present

## 2021-06-07 NOTE — Patient Instructions (Signed)
Continue oral iron Decrease pantoprazole to once a day dosing 40 mg Perform blood workup

## 2021-06-07 NOTE — Progress Notes (Signed)
Cory [name], M.D. Gastroenterology & Hepatology [facility] [address] [place], [place] [address]  Primary Care Physician: Sharilyn Sites, MD [address] [place] [address]  I will communicate my assessment and recommendations to the referring MD via EMR.  Problems: Iron deficiency anemia Barrett's esophagus w/o dysplasia  History of Present Illness: Cory [name] is a 75 y.o. male with past medical history of iron deficiency anemia, coronary artery disease status post stent placement, GERD, hyperlipidemia, hypertension, myocardial infarction, who presents for follow up of iron deficiency anemia.  The patient was last seen on 08/03/2020. At that time, the patient was kept on oral iron supplementation as he had presence of decreased ferritin.  He was also kept on PPI management of his Barrett's esophagus which she has been taking twice a day.  The patient denies having any complaints at the moment.  He has tolerated the intake of iron supplementation adequately without any side effects.  Denies any nausea, vomiting, fever, chills, hematochezia, melena, hematemesis, abdominal distention, abdominal pain, diarrhea, jaundice, pruritus or weight loss. Has gained 3 lb since last  year.  States that as long he takes his protonix twice a day he has no episodes of heartburn or chest pain.  He reports that he has not tried going down in the dosage.  Never required a transfusion in the past.  However, he reports that the first time he had presence of anemia he presented some episodes of large rectal bleeding.  This has not recurred.  Most recent labs from 10/19/2020 showed iron of 74, ferritin of 39, saturation of 22%, hemoglobin was 14.7.  Last EGD:04/10/2020, 2 cm hiatal hernia, there were changes concerning for short segment Barrett's esophagus (1 cm in length).  Biopsies were consistent with Barrett's esophagus without  dysplasia, normal stomach and duodenum. 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 No other abnormalities noted to small bowel mucosa.  Past Medical History: Past Medical History:  Diagnosis Date   Anemia    Arthritis    Coronary artery disease    a. 09/2001 PCI/BMS to D1: 2.5 x 15 Express 2 BMS;  b. 2011 Cath: stable anatomy; c. 03/2014 Low risk MV, EF 54%;  d. 09/2016 Inf STEMI/PCI [facility]): LM nl, LAD 29m, D1 nl w/ 90% in 91mm inf branch, LCX nl, RCA 99 (Synergy DES x 4 - 3.5x28 prox, 3.5x54m, 2.5x16d, 2.25x16 to RPDA), EF 55%.   GERD (gastroesophageal reflux disease)    Hyperlipidemia    Hypertensive heart disease    Myocardial infarction [facility]) 2017   Pneumonia    PTSD (post-traumatic stress disorder)     Past Surgical History: Past Surgical History:  Procedure Laterality Date   BIOPSY  04/10/2020   Procedure: BIOPSY;  Surgeon: Rogene Houston, MD;  [facility];  Service: Endoscopy;;  distal, esophageal   CARDIAC CATHETERIZATION  05/17/2010   left main free of significant lesion; LAD with 60% lesion in mid segment & 60-70% lesion in mid-distal segment with subtotal occlusion at apex & small side branch of diagonal 1 that is subtotally occluded; ramus intermedius with 90% ostial stenosis; L Cfx w/mild luminal irreg; RCA is dominant vessel with diffuse disease, 50% prox lesion & 60% lesion in mid segment & 60% lesion in PDA   CATARACT EXTRACTION W/PHACO Right 06/07/2015   Procedure: CATARACT EXTRACTION PHACO AND INTRAOCULAR LENS PLACEMENT ([place]);  Surgeon: Marylynn Pearson, MD;  Location:  [place] OR;  Service: Ophthalmology;  Laterality: Right;   COLONOSCOPY  02/25/2012   Procedure: COLONOSCOPY;  Surgeon: Jamesetta So, MD;  [facility];  Service: Gastroenterology;  Laterality: N/A;   COLONOSCOPY N/A 04/11/2020   Procedure: COLONOSCOPY;  Surgeon: Rogene Houston, MD;   [facility];  Service: Endoscopy;  Laterality: N/A;   CORONARY ANGIOPLASTY WITH STENT PLACEMENT  09/2001   Taxus (2.5x73mm) stent to 1st diagonal    ESOPHAGEAL DILATION N/A 08/14/2016   Procedure: ESOPHAGEAL DILATION;  Surgeon: Rogene Houston, MD;  [facility];  Service: Endoscopy;  Laterality: N/A;   ESOPHAGOGASTRODUODENOSCOPY  02/25/2012   Procedure: ESOPHAGOGASTRODUODENOSCOPY (EGD);  Surgeon: Jamesetta So, MD;  [facility];  Service: Gastroenterology;  Laterality: N/A;   ESOPHAGOGASTRODUODENOSCOPY N/A 07/28/2014   Procedure: ESOPHAGOGASTRODUODENOSCOPY (EGD);  Surgeon: Rogene Houston, MD;  [facility];  Service: Endoscopy;  Laterality: N/A;  200-rescheduled to 1 Ann notified pt   ESOPHAGOGASTRODUODENOSCOPY N/A 08/14/2016   Procedure: ESOPHAGOGASTRODUODENOSCOPY (EGD);  Surgeon: Rogene Houston, MD;  [facility];  Service: Endoscopy;  Laterality: N/A;  100   ESOPHAGOGASTRODUODENOSCOPY N/A 04/10/2020   Procedure: ESOPHAGOGASTRODUODENOSCOPY (EGD);  Surgeon: Rogene Houston, MD;  [facility];  Service: Endoscopy;  Laterality: N/A;   EYE SURGERY Bilateral    lasik surgery    GIVENS CAPSULE STUDY N/A 04/12/2020   Procedure: GIVENS CAPSULE STUDY;  Surgeon: Rogene Houston, MD;  [facility];  Service: Endoscopy;  Laterality: N/A;   KNEE ARTHROSCOPY WITH MEDIAL MENISECTOMY Left 02/05/2018   Procedure: LEFT KNEE ARTHROSCOPY WITH PARTIAL MEDIAL MENISECTOMY;  Surgeon: Carole Civil, MD;  [facility]: [facility];  Service: Orthopedics;  Laterality: Left;   KNEE SURGERY     LATERAL EPICONDYLE RELEASE Left 01/21/2014   Procedure: Left Tennis Elbow Release with debridement  tendon repair with reattachment;  Surgeon: Carole Civil, MD;  Location: [facility];  Service: Orthopedics;  Laterality: Left;   penile implant     PROSTATE SURGERY     SHOULDER SURGERY     TONSILLECTOMY     TOTAL KNEE ARTHROPLASTY Left 10/19/2018    Procedure: LEFT TOTAL KNEE ARTHROPLASTY;  Surgeon: Vickey Huger, MD;  [facility];  Service: Orthopedics;  Laterality: Left;  with abductor block failed spinal   TRANSTHORACIC ECHOCARDIOGRAM  03/2010   EF=>55%; LV mildly dilated; LA mod dilated; mild MR & TR; normal RVSP; mild pulm valve regurg    Family History: Family History  Problem Relation Age of Onset   Colitis Maternal Aunt     Social History: Social History   Tobacco Use  Smoking Status Never  Smokeless Tobacco Never   Social History   Substance and Sexual Activity  Alcohol Use Yes   Alcohol/week: 1.0 standard drink   Types: 1 Glasses of wine per week   Comment: nightly to help sleep   Social History   Substance and Sexual Activity  Drug Use No    Allergies: Allergies  Allergen Reactions   Ace Inhibitors Other (See Comments)    unknown   Benadryl [Diphenhydramine Hcl] Other (See Comments)    Opposite reaction of medication purpose - hyper   Vioxx [Rofecoxib] Other (See Comments)    headache    Medications: Current Outpatient Medications  Medication Sig Dispense Refill   aspirin EC 81 MG tablet Take 81 mg by mouth daily. Swallow whole.     atorvastatin (LIPITOR) 80 MG tablet Take  1 tablet (80 mg total) by mouth daily at 6 PM. 90 tablet 3   buPROPion (WELLBUTRIN SR) 100 MG 12 hr tablet Take 100 mg by mouth daily.     carvedilol (COREG) 3.125 MG tablet Take 1 tablet (3.125 mg total) by mouth 2 (two) times daily. 180 tablet 3   ferrous sulfate 325 (65 FE) MG tablet Take 1 tablet (325 mg total) by mouth daily with breakfast.     fluticasone (FLONASE) 50 MCG/ACT nasal spray Place 1 spray into both nostrils daily as needed for allergies or rhinitis.     hydrOXYzine (ATARAX/VISTARIL) 10 MG tablet Take 10 mg by mouth at bedtime as needed. 1 tab HS PRN, 1-2 tabs PRN for anxiety.     irbesartan (AVAPRO) 300 MG tablet Take 1 tablet (300 mg total) by mouth daily. 90 tablet 1   loratadine (CLARITIN) 10 MG tablet  Take 10 mg by mouth daily.     Magnesium Malate 1250 (141.7 Mg) MG TABS Take 1,250 mg by mouth every evening.      pantoprazole (PROTONIX) 40 MG tablet Take 1 tablet (40 mg total) by mouth 2 (two) times daily before a meal. 180 tablet 1   traZODone (DESYREL) 100 MG tablet Take 100 mg by mouth at bedtime.      venlafaxine (EFFEXOR) 75 MG tablet Take 75 mg by mouth daily.     VITAMIN D PO Take 25 mg by mouth daily.     acetaminophen (TYLENOL) 325 MG tablet Take 650 mg by mouth daily as needed for moderate pain or headache. (Patient not taking: Reported on 06/07/2021)     No current facility-administered medications for this visit.    Review of Systems: GENERAL: negative for malaise, night sweats HEENT: No changes in hearing or vision, no nose bleeds or other nasal problems. NECK: Negative for lumps, goiter, pain and significant neck swelling RESPIRATORY: Negative for cough, wheezing CARDIOVASCULAR: Negative for chest pain, leg swelling, palpitations, orthopnea GI: SEE HPI MUSCULOSKELETAL: Negative for joint pain or swelling, back pain, and muscle pain. SKIN: Negative for lesions, rash PSYCH: Negative for sleep disturbance, mood disorder and recent psychosocial stressors. HEMATOLOGY Negative for prolonged bleeding, bruising easily, and swollen nodes. ENDOCRINE: Negative for cold or heat intolerance, polyuria, polydipsia and goiter. NEURO: negative for tremor, gait imbalance, syncope and seizures. The remainder of the review of systems is noncontributory.   Physical Exam: BP (!) 143/82 (BP Location: Right Arm, Patient Position: Sitting, Cuff Size: Large)   Pulse 68   Temp 97.8 F (36.6 C) (Oral)   Ht 5\' 9"  (1.753 m)   Wt 184 lb (83.5 kg)   BMI 27.17 kg/m  GENERAL: The patient is AO x3, in no acute distress. HEENT: Head is normocephalic and atraumatic. EOMI are intact. Mouth is well hydrated and without lesions. NECK: Supple. No masses LUNGS: Clear to auscultation. No presence of  rhonchi/wheezing/rales. Adequate chest expansion HEART: RRR, normal s1 and s2. ABDOMEN: Soft, nontender, no guarding, no peritoneal signs, and nondistended. BS +. No masses. EXTREMITIES: Without any cyanosis, clubbing, rash, lesions or edema. NEUROLOGIC: AOx3, no focal motor deficit. SKIN: no jaundice, no rashes  Imaging/Labs: as above  I personally reviewed and interpreted the available labs, imaging and endoscopic files.  Impression and Plan: Cory [name] is a 75 y.o. male with past medical history of iron deficiency anemia, coronary artery disease status post stent placement, GERD, hyperlipidemia, hypertension, myocardial infarction, who presents for follow up of iron deficiency anemia.  The patient has  not presented any more episodes of overt gastrointestinal bleeding.  It is very likely that he presented an episode of diverticular bleeding in the past that has not recurred.  He has responded to his oral iron supplementation as his most recent iron stores were normal.  Notably, he had a negative capsule endoscopy in the past.  We will check a repeat CBC and iron stores today, he was advised to continue on oral iron supplementation but consideration to stop in the future will be held.  I also advised him to decrease his pantoprazole to once a day dosing.  He can increase back to twice daily dosing if he has recurrence of heartburn.  He will need to have a repeat EGD 3 years from his last 1.  - Continue oral iron - Decrease pantoprazole to once a day dosing 40 mg -Perform blood workup -Repeat EGD in 2024. - Will consider stopping iron in next appointment  All questions were answered.      Harvel Quale, MD Gastroenterology and Hepatology [facility]

## 2021-06-08 LAB — CBC WITH DIFFERENTIAL/PLATELET
Absolute Monocytes: 618 cells/uL (ref 200–950)
Basophils Absolute: 60 cells/uL (ref 0–200)
Basophils Relative: 1 %
Eosinophils Absolute: 246 cells/uL (ref 15–500)
Eosinophils Relative: 4.1 %
HCT: 44.8 % (ref 38.5–50.0)
Hemoglobin: 15.5 g/dL (ref 13.2–17.1)
Lymphs Abs: 1572 cells/uL (ref 850–3900)
MCH: 31.6 pg (ref 27.0–33.0)
MCHC: 34.6 g/dL (ref 32.0–36.0)
MCV: 91.4 fL (ref 80.0–100.0)
MPV: 9.9 fL (ref 7.5–12.5)
Monocytes Relative: 10.3 %
Neutro Abs: 3504 cells/uL (ref 1500–7800)
Neutrophils Relative %: 58.4 %
Platelets: 175 10*3/uL (ref 140–400)
RBC: 4.9 10*6/uL (ref 4.20–5.80)
RDW: 12.5 % (ref 11.0–15.0)
Total Lymphocyte: 26.2 %
WBC: 6 10*3/uL (ref 3.8–10.8)

## 2021-06-08 LAB — FERRITIN: Ferritin: 92 ng/mL (ref 24–380)

## 2021-06-08 LAB — IRON, TOTAL/TOTAL IRON BINDING CAP
%SAT: 36 % (calc) (ref 20–48)
Iron: 115 ug/dL (ref 50–180)
TIBC: 318 mcg/dL (calc) (ref 250–425)

## 2021-07-12 ENCOUNTER — Other Ambulatory Visit: Payer: Self-pay

## 2021-07-12 DIAGNOSIS — I10 Essential (primary) hypertension: Secondary | ICD-10-CM

## 2021-07-12 MED ORDER — IRBESARTAN 300 MG PO TABS
300.0000 mg | ORAL_TABLET | Freq: Every day | ORAL | 0 refills | Status: AC
Start: 2021-07-12 — End: ?

## 2021-07-29 DIAGNOSIS — I10 Essential (primary) hypertension: Secondary | ICD-10-CM | POA: Diagnosis not present

## 2021-07-29 DIAGNOSIS — E782 Mixed hyperlipidemia: Secondary | ICD-10-CM | POA: Diagnosis not present

## 2021-07-29 DIAGNOSIS — K219 Gastro-esophageal reflux disease without esophagitis: Secondary | ICD-10-CM | POA: Diagnosis not present

## 2021-08-20 DIAGNOSIS — B352 Tinea manuum: Secondary | ICD-10-CM | POA: Diagnosis not present

## 2021-08-20 DIAGNOSIS — L309 Dermatitis, unspecified: Secondary | ICD-10-CM | POA: Diagnosis not present

## 2021-08-30 ENCOUNTER — Encounter: Payer: Self-pay | Admitting: *Deleted

## 2021-08-30 ENCOUNTER — Other Ambulatory Visit: Payer: Self-pay

## 2021-08-30 ENCOUNTER — Ambulatory Visit
Admission: EM | Admit: 2021-08-30 | Discharge: 2021-08-30 | Disposition: A | Discharge status: Home / Self Care | Payer: Medicare HMO | Attending: Emergency Medicine | Admitting: Emergency Medicine

## 2021-08-30 DIAGNOSIS — J069 Acute upper respiratory infection, unspecified: Secondary | ICD-10-CM | POA: Diagnosis not present

## 2021-08-30 MED ORDER — MOLNUPIRAVIR 200 MG PO CAPS: 800.0000 mg | ORAL_CAPSULE | Freq: Two times a day (BID) | ORAL | 0 refills | Status: AC

## 2021-08-30 MED ORDER — BENZONATATE 100 MG PO CAPS: 100.0000 mg | ORAL_CAPSULE | Freq: Three times a day (TID) | ORAL | 0 refills | Status: DC

## 2021-08-30 NOTE — Discharge Instructions (Addendum)
You should remain isolated in your home for 5 days from symptom onset AND greater than 72 hours after symptoms resolution (absence of fever without the use of fever-reducing medication and improvement in respiratory symptoms), whichever is longer Get plenty of rest and push fluids Antiviral medication prescribed Tessalon Perles prescribed for cough Use OTC zyrtec for nasal congestion, runny nose, and/or sore throat Use OTC flonase for nasal congestion and runny nose Use medications daily for symptom relief Use OTC medications like ibuprofen or tylenol as needed fever or pain Call or go to the ED if you have any new or worsening symptoms such as fever, worsening cough, shortness of breath, chest tightness, chest pain, turning blue, changes in mental status, etc..Marland Kitchen

## 2021-08-30 NOTE — ED Provider Notes (Signed)
[place]   [id number] 08/30/21 Arrival Time: 1129   CC: COVID symptoms  SUBJECTIVE: History from: patient.  Cory [name] is a 75 y.o. male who presents with cough, runny nose, sneezing, congestion, and sore throat x 3 days.  Denies sick exposure to COVID, flu or strep.  Denies alleviating or aggravating factors.  Denies previous symptoms in the past.   Denies fever, chills, SOB, wheezing, chest pain, nausea, changes in bowel or bladder habits.    ROS: As per HPI.  All other pertinent ROS negative.     Past Medical History:  Diagnosis Date   Anemia    Arthritis    Coronary artery disease    a. 09/2001 PCI/BMS to D1: 2.5 x 15 Express 2 BMS;  b. 2011 Cath: stable anatomy; c. 03/2014 Low risk MV, EF 54%;  d. 09/2016 Inf STEMI/PCI [facility]): LM nl, LAD 96m D1 nl w/ 90% in 155minf branch, LCX nl, RCA 99 (Synergy DES x 4 - 3.5x28 prox, 3.5x3832m.5x16d, 2.25x16 to RPDA), EF 55%.   GERD (gastroesophageal reflux disease)    Hyperlipidemia    Hypertensive heart disease    [facility]   Pneumonia    PTSD (post-traumatic stress disorder)    Past Surgical History:  Procedure Laterality Date   BIOPSY  04/10/2020   Procedure: BIOPSY;  Surgeon: RehRogene HoustonD;  [facility];  Service: Endoscopy;;  distal, esophageal   CARDIAC CATHETERIZATION  05/17/2010   left main free of significant lesion; LAD with 60% lesion in mid segment & 60-70% lesion in mid-distal segment with subtotal occlusion at apex & small side branch of diagonal 1 that is subtotally occluded; ramus intermedius with 90% ostial stenosis; L Cfx w/mild luminal irreg; RCA is dominant vessel with diffuse disease, 50% prox lesion & 60% lesion in mid segment & 60% lesion in PDA   CATARACT EXTRACTION W/PHACO Right 06/07/2015   Procedure: CATARACT EXTRACTION PHACO AND INTRAOCULAR LENS PLACEMENT ([place] Surgeon: RoyMarylynn PearsonD;  [facility] [place];   Laterality: Right;   COLONOSCOPY  02/25/2012   Procedure: COLONOSCOPY;  Surgeon: MarJamesetta SoD;  [facility];  Service: Gastroenterology;  Laterality: N/A;   COLONOSCOPY N/A 04/11/2020   Procedure: COLONOSCOPY;  Surgeon: RehRogene HoustonD;  [facility];  Service: Endoscopy;  Laterality: N/A;   CORONARY ANGIOPLASTY WITH STENT PLACEMENT  09/2001   Taxus (2.5x15m97mtent to 1st diagonal    ESOPHAGEAL DILATION N/A 08/14/2016   Procedure: ESOPHAGEAL DILATION;  Surgeon: NajeRogene Houston;  [facility];  Service: Endoscopy;  Laterality: N/A;   ESOPHAGOGASTRODUODENOSCOPY  02/25/2012   Procedure: ESOPHAGOGASTRODUODENOSCOPY (EGD);  Surgeon: MarkJamesetta So;  [facility];  Service: Gastroenterology;  Laterality: N/A;   ESOPHAGOGASTRODUODENOSCOPY N/A 07/28/2014   Procedure: ESOPHAGOGASTRODUODENOSCOPY (EGD);  Surgeon: NajeRogene Houston;  [facility];  Service: Endoscopy;  Laterality: N/A;  200-rescheduled to 245 60 notified pt   ESOPHAGOGASTRODUODENOSCOPY N/A 08/14/2016   Procedure: ESOPHAGOGASTRODUODENOSCOPY (EGD);  Surgeon: NajeRogene Houston;  [facility];  Service: Endoscopy;  Laterality: N/A;  100   ESOPHAGOGASTRODUODENOSCOPY N/A 04/10/2020   Procedure: ESOPHAGOGASTRODUODENOSCOPY (EGD);  Surgeon: RehmRogene Houston;  [facility];  Service: Endoscopy;  Laterality: N/A;   EYE SURGERY Bilateral    lasik surgery    GIVENS CAPSULE STUDY N/A 04/12/2020   Procedure: GIVENS CAPSULE STUDY;  Surgeon: Rogene Houston, MD;  [facility];  Service: Endoscopy;  Laterality: N/A;   KNEE ARTHROSCOPY WITH MEDIAL MENISECTOMY Left 02/05/2018   Procedure: LEFT KNEE ARTHROSCOPY WITH PARTIAL MEDIAL MENISECTOMY;  Surgeon: Carole Civil, MD;  [facility];  Service: Orthopedics;  Laterality: Left;   KNEE SURGERY     LATERAL EPICONDYLE RELEASE Left 01/21/2014   Procedure: Left Tennis Elbow Release with  debridement  tendon repair with reattachment;  Surgeon: Carole Civil, MD;  [facility];  Service: Orthopedics;  Laterality: Left;   penile implant     PROSTATE SURGERY     SHOULDER SURGERY     TONSILLECTOMY     TOTAL KNEE ARTHROPLASTY Left 10/19/2018   Procedure: LEFT TOTAL KNEE ARTHROPLASTY;  Surgeon: Vickey Huger, MD;  [facility];  Service: Orthopedics;  Laterality: Left;  with abductor block failed spinal   TRANSTHORACIC ECHOCARDIOGRAM  03/2010   EF=>55%; LV mildly dilated; LA mod dilated; mild MR & TR; normal RVSP; mild pulm valve regurg   Allergies  Allergen Reactions   Ace Inhibitors Other (See Comments)    unknown   Benadryl [Diphenhydramine Hcl] Other (See Comments)    Opposite reaction of medication purpose - hyper   Vioxx [Rofecoxib] Other (See Comments)    headache   No current facility-administered medications on file prior to encounter.   Current Outpatient Medications on File Prior to Encounter  Medication Sig Dispense Refill   acetaminophen (TYLENOL) 325 MG tablet Take 650 mg by mouth daily as needed for moderate pain or headache. (Patient not taking: Reported on 06/07/2021)     aspirin EC 81 MG tablet Take 81 mg by mouth daily. Swallow whole.     atorvastatin (LIPITOR) 80 MG tablet Take 1 tablet (80 mg total) by mouth daily at 6 PM. 90 tablet 3   buPROPion (WELLBUTRIN SR) 100 MG 12 hr tablet Take 100 mg by mouth daily.     carvedilol (COREG) 3.125 MG tablet Take 1 tablet (3.125 mg total) by mouth 2 (two) times daily. 180 tablet 3   ferrous sulfate 325 (65 FE) MG tablet Take 1 tablet (325 mg total) by mouth daily with breakfast.     fluticasone (FLONASE) 50 MCG/ACT nasal spray Place 1 spray into both nostrils daily as needed for allergies or rhinitis.     hydrOXYzine (ATARAX/VISTARIL) 10 MG tablet Take 10 mg by mouth at bedtime as needed. 1 tab HS PRN, 1-2 tabs PRN for anxiety.     irbesartan (AVAPRO) 300 MG tablet Take 1 tablet (300 mg total) by mouth  daily. PATIENT NEED TO SCHEDULE APPOINTMENT FOR FUTURE REFILLS. Take 1 tablet (300 mg total) by mouth daily. 90 tablet 0   loratadine (CLARITIN) 10 MG tablet Take 10 mg by mouth daily.     Magnesium Malate 1250 (141.7 Mg) MG TABS Take 1,250 mg by mouth every evening.      pantoprazole (PROTONIX) 40 MG tablet Take 1 tablet (40 mg total) by mouth 2 (two) times daily before a meal. 180 tablet 1   traZODone (DESYREL) 100 MG tablet Take 100 mg by mouth at bedtime.      venlafaxine (EFFEXOR) 75 MG tablet Take 75 mg by mouth daily.     VITAMIN D PO Take 25 mg by mouth daily.     Social History   Socioeconomic History   Marital status: Married    Spouse name: Not on file   Number of children: Not on file  Years of education: Not on file   Highest education level: Not on file  Occupational History   Not on file  Tobacco Use   Smoking status: Never   Smokeless tobacco: Never  Vaping Use   Vaping Use: Never used  Substance and Sexual Activity   Alcohol use: Yes    Alcohol/week: 1.0 standard drink    Types: 1 Glasses of wine per week    Comment: nightly to help sleep   Drug use: No   Sexual activity: Not on file  Other Topics Concern   Not on file  Social History Narrative   Not on file   Social Determinants of Health   Financial Resource Strain: Not on file  Food Insecurity: Not on file  Transportation Needs: Not on file  Physical Activity: Not on file  Stress: Not on file  Social Connections: Not on file  Intimate Partner Violence: Not on file   Family History  Problem Relation Age of Onset   Colitis Maternal Aunt     OBJECTIVE:  Vitals:   08/30/21 1201  BP: 134/83  Pulse: 94  Resp: 20  Temp: 98.3 F (36.8 C)  SpO2: 98%    General appearance: alert; mildly fatigued appearing, nontoxic; speaking in full sentences and tolerating own secretions HEENT: NCAT; Ears: EACs clear, TMs pearly gray; Eyes: PERRL.  EOM grossly intact. Abnormal RT pupil Nose: nares patent  without rhinorrhea, Throat: oropharynx clear, tonsils non erythematous or enlarged, uvula midline  Neck: supple without LAD Lungs: unlabored respirations, symmetrical air entry; cough: absent; no respiratory distress; CTAB Heart: regular rate and rhythm.  Skin: warm and dry Psychological: alert and cooperative; normal mood and affect   ASSESSMENT & PLAN:  1. Viral URI with cough     Meds ordered this encounter  Medications   Molnupiravir 200 MG CAPS    Sig: Take 4 capsules (800 mg total) by mouth in the morning and at bedtime for 5 days.    Dispense:  40 capsule    Refill:  0    Order Specific Question:   Supervising Provider    Answer:   Raylene Everts [id number]   benzonatate (TESSALON) 100 MG capsule    Sig: Take 1 capsule (100 mg total) by mouth every 8 (eight) hours.    Dispense:  21 capsule    Refill:  0    Order Specific Question:   Supervising Provider    Answer:   Raylene Everts [id number]   You should remain isolated in your home for 5 days from symptom onset AND greater than 72 hours after symptoms resolution (absence of fever without the use of fever-reducing medication and improvement in respiratory symptoms), whichever is longer Get plenty of rest and push fluids Antiviral medication prescribed Tessalon Perles prescribed for cough Use OTC zyrtec for nasal congestion, runny nose, and/or sore throat Use OTC flonase for nasal congestion and runny nose Use medications daily for symptom relief Use OTC medications like ibuprofen or tylenol as needed fever or pain Call or go to the ED if you have any new or worsening symptoms such as fever, worsening cough, shortness of breath, chest tightness, chest pain, turning blue, changes in mental status, etc...   Reviewed expectations re: course of current medical issues. Questions answered. Outlined signs and symptoms indicating need for more acute intervention. Patient verbalized understanding. After Visit Summary  given.          Lestine Box, PA-C 08/30/21 1228

## 2021-08-30 NOTE — ED Triage Notes (Signed)
Pt reports he had a positive Home COVID test yesterday. Pt has a cough ,sore throat and runny nose.

## 2021-09-20 DIAGNOSIS — L309 Dermatitis, unspecified: Secondary | ICD-10-CM | POA: Diagnosis not present

## 2021-10-22 DIAGNOSIS — J22 Unspecified acute lower respiratory infection: Secondary | ICD-10-CM | POA: Diagnosis not present

## 2021-10-22 DIAGNOSIS — J309 Allergic rhinitis, unspecified: Secondary | ICD-10-CM | POA: Diagnosis not present

## 2022-04-02 DIAGNOSIS — D508 Other iron deficiency anemias: Secondary | ICD-10-CM | POA: Diagnosis not present

## 2022-04-02 DIAGNOSIS — E663 Overweight: Secondary | ICD-10-CM | POA: Diagnosis not present

## 2022-04-02 DIAGNOSIS — D509 Iron deficiency anemia, unspecified: Secondary | ICD-10-CM | POA: Diagnosis not present

## 2022-04-02 DIAGNOSIS — R5383 Other fatigue: Secondary | ICD-10-CM | POA: Diagnosis not present

## 2022-04-02 DIAGNOSIS — Z6827 Body mass index (BMI) 27.0-27.9, adult: Secondary | ICD-10-CM | POA: Diagnosis not present

## 2022-04-09 NOTE — Therapy (Signed)
OUTPATIENT PHYSICAL THERAPY THORACOLUMBAR EVALUATION   Patient Name: Cory [name] MRN: [id number] DOB:1946/04/17, 76 y.o., male Today's Date: 04/09/2022    Past Medical History:  Diagnosis Date   Anemia    Arthritis    Coronary artery disease    a. 09/2001 PCI/BMS to D1: 2.5 x 15 Express 2 BMS;  b. 2011 Cath: stable anatomy; c. 03/2014 Low risk MV, EF 54%;  d. 09/2016 Inf STEMI/PCI [facility]): LM nl, LAD 41m D1 nl w/ 90% in 148minf branch, LCX nl, RCA 99 (Synergy DES x 4 - 3.5x28 prox, 3.5x3819m.5x16d, 2.25x16 to RPDA), EF 55%.   GERD (gastroesophageal reflux disease)    Hyperlipidemia    Hypertensive heart disease    [facility]   Pneumonia    PTSD (post-traumatic stress disorder)    Past Surgical History:  Procedure Laterality Date   BIOPSY  04/10/2020   Procedure: BIOPSY;  Surgeon: RehRogene HoustonD;  [facility];  Service: Endoscopy;;  distal, esophageal   CARDIAC CATHETERIZATION  05/17/2010   left main free of significant lesion; LAD with 60% lesion in mid segment & 60-70% lesion in mid-distal segment with subtotal occlusion at apex & small side branch of diagonal 1 that is subtotally occluded; ramus intermedius with 90% ostial stenosis; L Cfx w/mild luminal irreg; RCA is dominant vessel with diffuse disease, 50% prox lesion & 60% lesion in mid segment & 60% lesion in PDA   CATARACT EXTRACTION W/PHACO Right 06/07/2015   Procedure: CATARACT EXTRACTION PHACO AND INTRAOCULAR LENS PLACEMENT ([place]geon: RoyMarylynn PearsonD;  [facility];  Laterality: Right;   COLONOSCOPY  02/25/2012   Procedure: COLONOSCOPY;  Surgeon: MarJamesetta SoD;  [facility];  Service: Gastroenterology;  Laterality: N/A;   COLONOSCOPY N/A 04/11/2020   Procedure: COLONOSCOPY;  Surgeon: RehRogene HoustonD;  [facility];  Service: Endoscopy;  Laterality: N/A;   CORONARY ANGIOPLASTY WITH STENT PLACEMENT   09/2001   Taxus (2.5x15m32mtent to 1st diagonal    ESOPHAGEAL DILATION N/A 08/14/2016   Procedure: ESOPHAGEAL DILATION;  Surgeon: NajeRogene Houston;  [facility];  Service: Endoscopy;  Laterality: N/A;   ESOPHAGOGASTRODUODENOSCOPY  02/25/2012   Procedure: ESOPHAGOGASTRODUODENOSCOPY (EGD);  Surgeon: MarkJamesetta So;  [facility];  Service: Gastroenterology;  Laterality: N/A;   ESOPHAGOGASTRODUODENOSCOPY N/A 07/28/2014   Procedure: ESOPHAGOGASTRODUODENOSCOPY (EGD);  Surgeon: NajeRogene Houston;  [facility];  Service: Endoscopy;  Laterality: N/A;  200-rescheduled to 245 26 notified pt   ESOPHAGOGASTRODUODENOSCOPY N/A 08/14/2016   Procedure: ESOPHAGOGASTRODUODENOSCOPY (EGD);  Surgeon: NajeRogene Houston;  [facility];  Service: Endoscopy;  Laterality: N/A;  100   ESOPHAGOGASTRODUODENOSCOPY N/A 04/10/2020   Procedure: ESOPHAGOGASTRODUODENOSCOPY (EGD);  Surgeon: RehmRogene Houston;  [facility];  Service: Endoscopy;  Laterality: N/A;   EYE SURGERY Bilateral    lasik surgery    GIVENS CAPSULE STUDY N/A 04/12/2020   Procedure: GIVENS CAPSULE STUDY;  Surgeon: RehmRogene Houston;  [facility];  Service: Endoscopy;  Laterality: N/A;   KNEE ARTHROSCOPY WITH MEDIAL MENISECTOMY Left 02/05/2018   Procedure: LEFT KNEE ARTHROSCOPY WITH PARTIAL MEDIAL MENISECTOMY;  Surgeon: HarrCarole Civil;  Location: [facility];  Service: Orthopedics;  Laterality: Left;   KNEE SURGERY     LATERAL EPICONDYLE RELEASE Left 01/21/2014   Procedure: Left Tennis Elbow Release with debridement  tendon repair with reattachment;  Surgeon: Carole Civil, MD;  Location: [facility];  Service: Orthopedics;  Laterality: Left;   penile implant     PROSTATE SURGERY     SHOULDER SURGERY     TONSILLECTOMY     TOTAL KNEE ARTHROPLASTY Left 10/19/2018   Procedure: LEFT TOTAL KNEE ARTHROPLASTY;  Surgeon: Vickey Huger, MD;  Location: [facility];  Service: Orthopedics;   Laterality: Left;  with abductor block failed spinal   TRANSTHORACIC ECHOCARDIOGRAM  03/2010   EF=>55%; LV mildly dilated; LA mod dilated; mild MR & TR; normal RVSP; mild pulm valve regurg   Patient Active Problem List   Diagnosis Date Noted   Iron deficiency anemia 06/07/2021   GERD (gastroesophageal reflux disease) 06/07/2021   Barrett's esophagus 06/07/2021   Low back pain 01/13/2020   S/P total knee replacement 10/19/2018   S/P left knee arthroscopy 02/05/18 02/12/2018   Ischemic cardiomyopathy 11/18/2017   Acute pain of left knee 01/24/2017   NSTEMI (non-ST elevated myocardial infarction) ([place]) 01/08/2017   ST elevation myocardial infarction (STEMI) of inferior wall, subsequent episode of care ([place]) 01/08/2017   Mixed hyperlipidemia 01/08/2017   Sinus bradycardia 01/08/2017   Left tennis elbow 11/08/2013   Stable angina ([place]) 11/08/2013   CAD in native artery 11/08/2013   PTSD (post-traumatic stress disorder) 11/08/2013   Essential hypertension 11/08/2013   Dyslipidemia 11/08/2013   Fracture of phalanx of thumb 12/03/2011    PCP: Sharilyn Sites, MD  REFERRING PROVIDER: Meltontate, Gillis Santa, MD  REFERRING DIAG:  [phone] Spondylosis without Myelopathy or Radiculopathy, lumbar/Lumbar disc disease status post hemilaminectomy -  Note:   THERAPY DIAG:  No diagnosis found.  ONSET DATE: 6 months ago  SUBJECTIVE:                                                                                                                                                                                           SUBJECTIVE STATEMENT: Patient had surgery on his back 2008; felt good since then until about 6 months ago. Noticed he was having pain when bending over and getting back up; no specific injury noted. Hurts L4-5 area  "where I had surgery" but has not had pain since started using TENS unit; brought with him today to make sure it's set properly.   PERTINENT HISTORY:  S/p 2008 M47.816  Spondylosis without Myelopathy or Radiculopathy, lumbar/Lumbar disc disease status post hemilaminectomy Also having left ankle pain; sees Dr. Milinda Pointer TKA L knee 2019 per Dr. Ronnie Derby  PAIN:  Are you having pain? Yes: NPRS scale: 0/10 Pain location: low back  Pain description: just bad pain Aggravating factors:  after bending over when returning to stand Relieving factors: TENS unit   PRECAUTIONS: None  WEIGHT BEARING RESTRICTIONS No  FALLS:  Has patient fallen in last 6 months? No  LIVING ENVIRONMENT: Lives with: lives with their spouse Lives in: House/apartment Stairs: Yes: External: 1 steps; none Has following equipment at home: Gilford Rile - 2 wheeled and Grab bars  OCCUPATION: retired Clinical biochemist, [place] hurt less with activity   OBJECTIVE:   DIAGNOSTIC FINDINGS:  from 01/02/2018 Patient has extensive lumbar spine disease in multiple areas with mild coronal plane scoliosis.  Multiple osteophytes are noted between L3 and 4 L4 and 5 with apex of the scoliosis at 3 4 to the right large osteophyte disc complex L3-4 on the left   Mild pelvic height differentiation left higher than right   Extremely straight lumbar spine with exacerbation of the findings noted prior.  Appears to have had an L5-S1 procedure and there is disease at L4 L3 L2 L1 and loss of lordosis.     Impression extensive lumbar spondylosis and degenerative scoliosis  PATIENT SURVEYS:  FOTO 70  SCREENING FOR RED FLAGS: Bowel or bladder incontinence: No Spinal tumors: No Cauda equina syndrome: No Compression fracture: No Abdominal aneurysm: No  COGNITION:  Overall cognitive status: Within functional limits for tasks assessed     SENSATION: Appears wfl   POSTURE:  Decreased lumbar lordosis  PALPATION: No tenderness on palpation today  LUMBAR ROM:   Active  A/PROM  04/09/2022  Flexion 80% available   Extension 50% available  Right lateral flexion 75% available   Left lateral flexion 75% available  Right rotation 75% available  Left rotation 75% available   (Blank rows = not tested)   LE MMT:  MMT Right 04/09/2022 Left 04/09/2022  Hip flexion 4+ 4  Hip extension 4 4-  Hip abduction    Hip adduction    Hip internal rotation    Hip external rotation    Knee flexion 4+ 4+  Knee extension 5 5  Ankle dorsiflexion 4+ 4  Ankle plantarflexion    Ankle inversion    Ankle eversion     (Blank rows = not tested)    FUNCTIONAL TESTS:  5 times sit to stand: 18 sec  GAIT: Distance walked: 100 ft Assistive device utilized: None Level of assistance: Complete Independence Comments: slight forward flexed trunk    TODAY'S TREATMENT  Physical therapy evaluation, HEP instruction, TENS instruction, plan of care   PATIENT EDUCATION:  Education details: HEP instruction, TENS instruction, plan of care Person educated: Patient Education method: Explanation, Demonstration, Verbal cues, and Handouts Education comprehension: verbalized understanding and returned demonstration   HOME EXERCISE PROGRAM: Access Code: [email or web address] URL: [email or web address] Date: 04/11/2022 Prepared by: [facility]  Exercises - Supine Lower Trunk Rotation  - 2 x daily - 7 x weekly - 1 sets - 10 reps - Supine Transversus Abdominis Bracing - Hands on Stomach  - 2 x daily - 7 x weekly - 1 sets - 10 reps - Prone Gluteal Sets  - 2 x daily - 7 x weekly - 1 sets - 10 reps - Prone Hip Extension  - 2 x daily - 7 x weekly - 1 sets - 10 reps  ASSESSMENT:  CLINICAL IMPRESSION: Patient is a 76  y.o. male who was seen today for physical therapy evaluation and treatment for back pain.  He presents with mild lumbar mobility deficits and lower extremity strength deficits left more than right  that have limited him somewhat; however he reports no back pain since using a TENS unit and no back pain is reproduced during evaluation today. Therapist instructed patient in a HEP  and correct use of TENS unit; recharging batteries, settings, etc.  Therapist had patient set up one more therapy appointment in a couple weeks with instructions to cancel if he is not having any trouble with his back with activity.    OBJECTIVE IMPAIRMENTS decreased mobility, decreased ROM, decreased strength, hypomobility, impaired flexibility, postural dysfunction, and pain.   ACTIVITY LIMITATIONS cleaning, community activity, driving, and yard work.   PERSONAL FACTORS Age, Fitness, and Past/current experiences are also affecting patient's functional outcome.    REHAB POTENTIAL: Good  CLINICAL DECISION MAKING: Stable/uncomplicated  EVALUATION COMPLEXITY: Low   GOALS: Goals reviewed with patient? No  LONG TERM GOALS: Target date: 04/25/2022  Patient will be independent in HEP to improve functional outcomes.  Baseline:  Goal status: INITIAL  2.  Patient will improve 5 x STS score from 18 sec to 15 sec to demonstrate improved functional mobility and increased lower extremity strength.  Baseline: 15 sec Goal status: INITIAL  3.  Patient will demonstrate correct use of TENS unit for self management of low back pain Baseline: needs Assist and instruction for correct use Goal status: INITIAL  4.  Patient will remain pain free will all lumbar AROM movements to perform all activities at home with pain or limitation. :  Goal status: INITIAL  5. Patient will increased all left lower extremity MMTs to equal to the Right lower extremity MMTs to ambulate community distances without difficulty or pain.  Goal status: INITIAL    PLAN: PT FREQUENCY: 1x/week  PT DURATION: other: EVERY OTHER WEEK FOR 2 WEEKS  PLANNED INTERVENTIONS: Therapeutic exercises, Therapeutic activity, Neuromuscular re-education, Balance training, Gait training, Patient/Family education, Joint manipulation, Joint mobilization, Stair training, Vestibular training, Orthotic/Fit training, DME instructions,  Aquatic Therapy, Dry Needling, Electrical stimulation, Spinal manipulation, Spinal mobilization, Cryotherapy, Moist heat, scar mobilization, Taping, Traction, Ultrasound, Biofeedback, and Manual therapy.  PLAN FOR NEXT SESSION: review goals and HEP, correct use of TENS unit ; progress lumbar mobility and stabilization program as appropriate   11:45 AM, 04/11/22 Amnah Breuer Small Kimyatta Lecy MPT [facility] [place] [phone] [id number]

## 2022-04-10 ENCOUNTER — Ambulatory Visit: Payer: Medicare HMO | Admitting: Student

## 2022-04-10 ENCOUNTER — Encounter: Payer: Self-pay | Admitting: Student

## 2022-04-10 VITALS — BP 132/84 | HR 65 | Ht 69.0 in | Wt 191.0 lb

## 2022-04-10 DIAGNOSIS — E785 Hyperlipidemia, unspecified: Secondary | ICD-10-CM

## 2022-04-10 DIAGNOSIS — I255 Ischemic cardiomyopathy: Secondary | ICD-10-CM

## 2022-04-10 DIAGNOSIS — R5383 Other fatigue: Secondary | ICD-10-CM | POA: Diagnosis not present

## 2022-04-10 DIAGNOSIS — I2510 Atherosclerotic heart disease of native coronary artery without angina pectoris: Secondary | ICD-10-CM | POA: Diagnosis not present

## 2022-04-10 DIAGNOSIS — I10 Essential (primary) hypertension: Secondary | ICD-10-CM

## 2022-04-10 DIAGNOSIS — I77810 Thoracic aortic ectasia: Secondary | ICD-10-CM

## 2022-04-10 NOTE — Progress Notes (Addendum)
Cardiology Office Note:    Date:  04/10/2022   ID:  ZECHERIAH HILLIER, DOB 02-26-1946, MRN [id number]  PCP:  Sharilyn Sites, MD  Cardiologist:  [name], MD  Electrophysiologist:  None   Referring MD: Sharilyn Sites, MD   Chief Complaint: fatigue  History of Present Illness:    HEAROLD TOUGH is a 76 y.o. male with a history of CAD s/p BMS in 2002 and then DES to RCA in 09/2016, ischemic cardiomyopathy with EF of 40-45% on last Echo in 2020, mildly dilatated ascending aorta, hypertension, hyperlipidemia, GERD, and PTSD who is followed by Dr. Debara Pickett and presents today for evaluation of fatigue.  Patient has a history of CAD with prior BMS in 09/2001. He was vacationing in the [place] area in 09/2016 when he was admitted for a STEIM. Emergent cardiac catheterization showed sub-total occlusion of the RCA which was treated with DEX x4. Since then patient has done well without any significant chest pain. He has known ischemic cardiomyopathy. Last Echo in 07/2019 showed LVEF of 40-45% with  akinesis of the mid and basal inferior walls hypokinesis of the inferolateral walls and otherwise diffuse hypokinesis and and grade 2 diastolic dysfunction. Also showed mild dilatation of the ascending aorta measuring 42 mm.  Patient was last seen by Dr. Debara Pickett in 06/2020 at which time he was doing well from a cardiac standpoint.   He presents today for further evaluation of fatigue. Here with wife. Patient reports new fatigue over the last month. He has been going to bed a lot earlier lately and has not had any energy to go to the gym in the morning like he usually does. He denies any chest pain, shortness of breath, orthopnea, PND, lower extremity, palpitations, lightheadedness, dizziness, syncope. Fatigue has not been his anginal equivalent in the past. He had chest pain prior to both his PCI in 2002 and MI in 2017.  Past Medical History:  Diagnosis Date   Anemia    Arthritis    Coronary artery  disease    a. 09/2001 PCI/BMS to D1: 2.5 x 15 Express 2 BMS;  b. 2011 Cath: stable anatomy; c. 03/2014 Low risk MV, EF 54%;  d. 09/2016 Inf STEMI/PCI [facility]): LM nl, LAD 56m D1 nl w/ 90% in 133minf branch, LCX nl, RCA 99 (Synergy DES x 4 - 3.5x28 prox, 3.5x3870m.5x16d, 2.25x16 to RPDA), EF 55%.   GERD (gastroesophageal reflux disease)    Hyperlipidemia    Hypertensive heart disease    [facility]   Pneumonia    PTSD (post-traumatic stress disorder)     Past Surgical History:  Procedure Laterality Date   BIOPSY  04/10/2020   Procedure: BIOPSY;  Surgeon: RehRogene HoustonD;  [facility];  Service: Endoscopy;;  distal, esophageal   CARDIAC CATHETERIZATION  05/17/2010   left main free of significant lesion; LAD with 60% lesion in mid segment & 60-70% lesion in mid-distal segment with subtotal occlusion at apex & small side branch of diagonal 1 that is subtotally occluded; ramus intermedius with 90% ostial stenosis; L Cfx w/mild luminal irreg; RCA is dominant vessel with diffuse disease, 50% prox lesion & 60% lesion in mid segment & 60% lesion in PDA   CATARACT EXTRACTION W/PHACO Right 06/07/2015   Procedure: CATARACT EXTRACTION PHACO AND INTRAOCULAR LENS PLACEMENT [place]geon: RoyMarylynn PearsonD;  [facility] [place];  Laterality: Right;   COLONOSCOPY  02/25/2012  Procedure: COLONOSCOPY;  Surgeon: Jamesetta So, MD;  [facility];  Service: Gastroenterology;  Laterality: N/A;   COLONOSCOPY N/A 04/11/2020   Procedure: COLONOSCOPY;  Surgeon: Rogene Houston, MD;  [facility];  Service: Endoscopy;  Laterality: N/A;   CORONARY ANGIOPLASTY WITH STENT PLACEMENT  09/2001   Taxus (2.5x61m) stent to 1st diagonal    ESOPHAGEAL DILATION N/A 08/14/2016   Procedure: ESOPHAGEAL DILATION;  Surgeon: NRogene Houston MD;  [facility];  Service: Endoscopy;  Laterality: N/A;   ESOPHAGOGASTRODUODENOSCOPY   02/25/2012   Procedure: ESOPHAGOGASTRODUODENOSCOPY (EGD);  Surgeon: MJamesetta So MD;  [facility];  Service: Gastroenterology;  Laterality: N/A;   ESOPHAGOGASTRODUODENOSCOPY N/A 07/28/2014   Procedure: ESOPHAGOGASTRODUODENOSCOPY (EGD);  Surgeon: NRogene Houston MD;  [facility];  Service: Endoscopy;  Laterality: N/A;  200-rescheduled to 269Ann notified pt   ESOPHAGOGASTRODUODENOSCOPY N/A 08/14/2016   Procedure: ESOPHAGOGASTRODUODENOSCOPY (EGD);  Surgeon: NRogene Houston MD;  [facility];  Service: Endoscopy;  Laterality: N/A;  100   ESOPHAGOGASTRODUODENOSCOPY N/A 04/10/2020   Procedure: ESOPHAGOGASTRODUODENOSCOPY (EGD);  Surgeon: RRogene Houston MD;  [facility];  Service: Endoscopy;  Laterality: N/A;   EYE SURGERY Bilateral    lasik surgery    GIVENS CAPSULE STUDY N/A 04/12/2020   Procedure: GIVENS CAPSULE STUDY;  Surgeon: RRogene Houston MD;  [facility];  Service: Endoscopy;  Laterality: N/A;   KNEE ARTHROSCOPY WITH MEDIAL MENISECTOMY Left 02/05/2018   Procedure: LEFT KNEE ARTHROSCOPY WITH PARTIAL MEDIAL MENISECTOMY;  Surgeon: HCarole Civil MD;  Location: [facility];  Service: Orthopedics;  Laterality: Left;   KNEE SURGERY     LATERAL EPICONDYLE RELEASE Left 01/21/2014   Procedure: Left Tennis Elbow Release with debridement  tendon repair with reattachment;  Surgeon: SCarole Civil MD;  Location: [facility];  Service: Orthopedics;  Laterality: Left;   penile implant     PROSTATE SURGERY     SHOULDER SURGERY     TONSILLECTOMY     TOTAL KNEE ARTHROPLASTY Left 10/19/2018   Procedure: LEFT TOTAL KNEE ARTHROPLASTY;  Surgeon: LVickey Huger MD;  Location[facility];  Service: Orthopedics;  Laterality: Left;  with abductor block failed spinal   TRANSTHORACIC ECHOCARDIOGRAM  03/2010   EF=>55%; LV mildly dilated; LA mod dilated; mild MR & TR; normal RVSP; mild pulm valve regurg    Current Medications: Current Meds  Medication Sig    acetaminophen (TYLENOL) 325 MG tablet Take 650 mg by mouth daily as needed for moderate pain or headache.   aspirin EC 81 MG tablet Take 81 mg by mouth daily. Swallow whole.   atorvastatin (LIPITOR) 80 MG tablet Take 1 tablet (80 mg total) by mouth daily at 6 PM.   buPROPion (WELLBUTRIN SR) 100 MG 12 hr tablet Take 100 mg by mouth daily.   ferrous sulfate 325 (65 FE) MG tablet Take 1 tablet (325 mg total) by mouth daily with breakfast.   fluticasone (FLONASE) 50 MCG/ACT nasal spray Place 1 spray into both nostrils daily as needed for allergies or rhinitis.   hydrOXYzine (ATARAX/VISTARIL) 10 MG tablet Take 10 mg by mouth at bedtime as needed. 1 tab HS PRN, 1-2 tabs PRN for anxiety.   irbesartan (AVAPRO) 300 MG tablet Take 1 tablet (300 mg total) by mouth daily. PATIENT NEED TO SCHEDULE APPOINTMENT FOR FUTURE REFILLS. Take 1 tablet (300 mg total) by mouth daily.   loratadine (CLARITIN) 10 MG tablet Take 10  mg by mouth daily.   Magnesium Malate 1250 (141.7 Mg) MG TABS Take 1,250 mg by mouth every evening.    pantoprazole (PROTONIX) 40 MG tablet Take 1 tablet (40 mg total) by mouth 2 (two) times daily before a meal.   traZODone (DESYREL) 100 MG tablet Take 100 mg by mouth at bedtime.    VITAMIN D PO Take 25 mg by mouth daily.   [DISCONTINUED] benzonatate (TESSALON) 100 MG capsule Take 1 capsule (100 mg total) by mouth every 8 (eight) hours.   [DISCONTINUED] venlafaxine (EFFEXOR) 75 MG tablet Take 75 mg by mouth daily.     Allergies:   Ace inhibitors, Benadryl [diphenhydramine hcl], and Vioxx [rofecoxib]   Social History   Socioeconomic History   Marital status: Married    Spouse name: Not on file   Number of children: Not on file   Years of education: Not on file   Highest education level: Not on file  Occupational History   Not on file  Tobacco Use   Smoking status: Never   Smokeless tobacco: Never  Vaping Use   Vaping Use: Never used  Substance and Sexual Activity   Alcohol use: Yes     Alcohol/week: 1.0 standard drink    Types: 1 Glasses of wine per week    Comment: nightly to help sleep   Drug use: No   Sexual activity: Not on file  Other Topics Concern   Not on file  Social History Narrative   Not on file   Social Determinants of Health   Financial Resource Strain: Not on file  Food Insecurity: Not on file  Transportation Needs: Not on file  Physical Activity: Not on file  Stress: Not on file  Social Connections: Not on file     Family History: The patient's family history includes Colitis in his maternal aunt.  ROS:   Please see the history of present illness.      EKGs/Labs/Other Studies Reviewed:    The following studies were reviewed today:  Echocardiogram 08/26/2019: Impressions: 1. The left ventricle has mild-moderately reduced systolic function, with  an ejection fraction of 40-45%. The cavity size was normal. Left  ventricular diastolic Doppler parameters are consistent with pseudonormal.  Left ventricular diffuse hypokinesis.   2. There is akinesis of the mid and basal inferior and hypokinesis of the  inferolateral walls.   3. The right ventricle has normal systolc function. The cavity was  normal. There is no increase in right ventricular wall thickness. Right  ventricular systolic pressure is normal with an estimated pressure of 27.8  mmHg.   4. Left atrial size was moderately dilated.   5. Aortic valve regurgitation is trivial by color flow Doppler.   6. There is mild dilatation of the ascending aorta measuring 42 mm.   EKG:  EKG  ordered today. EKG personally reviewed and demonstrates normal sinus rhythm, rate 65 bpm, with no acute ST/T changes. Borderline 1st degree AV block with PR interval of 282m. Left axis deviation QTc 422 ms.  Recent Labs: 06/07/2021: Hemoglobin 15.5; Platelets 175  Recent Lipid Panel    Component Value Date/Time   CHOL 114 05/21/2018 0823   TRIG 84 05/21/2018 0823   HDL 47 05/21/2018 0823   CHOLHDL  2.4 05/21/2018 0823   CHOLHDL 2.1 12/04/2016 0842   VLDL 13 12/04/2016 0842   LDLCALC 50 05/21/2018 0823    Physical Exam:    Vital Signs: BP 132/84 (BP Location: Left Arm, Patient Position: Sitting, Cuff  Size: Normal)   Pulse 65   Ht '5\' 9"'$  (1.753 m)   Wt 191 lb (86.6 kg)   BMI 28.21 kg/m     Wt Readings from Last 3 Encounters:  04/10/22 191 lb (86.6 kg)  06/07/21 184 lb (83.5 kg)  08/03/20 176 lb 9.6 oz (80.1 kg)     General: 76 y.o. Caucasian male in no acute distress. HEENT: Normocephalic and atraumatic. Sclera clear.  Neck: Supple. No carotid bruits. No JVD. Heart: RRR. Distinct S1 and S2. No murmurs, gallops, or rubs. Radial pulses 2+ and equal bilaterally. Lungs: No increased work of breathing. Clear to ausculation bilaterally. No wheezes, rhonchi, or rales.  Abdomen: Soft, non-distended, and non-tender to palpation. Extremities: No lower extremity edema.    Skin: Warm and dry. Neuro: Alert and oriented x3. No focal deficits. Psych: Normal affect. Responds appropriately.  Assessment:    1. Other fatigue   2. Coronary artery disease involving native coronary artery of native heart without angina pectoris   3. Ischemic cardiomyopathy   4. Ascending aorta dilatation (HCC)   5. Primary hypertension   6. Hyperlipidemia, unspecified hyperlipidemia type     Plan:    Fatigue Patient reports significant fatigue over the last month which is unusual for him. No other cardiac symptoms. - EKG shows no acute ischemic changes. - Recent labs at the [place] showed normal hemoglobin and normal ferritin level. - Fatigue has not been his anginal equivalent in the past. He had chest pain prior to both his PCI in 2002 and MI in 2017. However, I offered a nuclear stress test to patient and he would like to have this done. I think this is very reasonable given it has been 6 years since his last PCI. Will order cardiac PET CT MPI. Will also check a TSH.  Shared Decision Making/Informed  Consent{ The risks [chest pain, shortness of breath, cardiac arrhythmias, dizziness, blood pressure fluctuations, myocardial infarction, stroke/transient ischemic attack, nausea, vomiting, allergic reaction, radiation exposure, metallic taste sensation and life-threatening complications (estimated to be 1 in 10,000)], benefits (risk stratification, diagnosing coronary artery disease, treatment guidance) and alternatives of a nuclear stress test were discussed in detail with Mr. Lafon and he agrees to proceed.  CAD History of remote BMS in 2002 and then inferior STEMI in 2017 which was treated with DES x4 to the RCA. - No angina. - Continue aspirin, beta-blocker, and statin.  Ischemic Cardiomyopathy Last Echo in 07/2019 showed LVEF of 40-45% with  akinesis of the mid and basal inferior walls hypokinesis of the inferolateral walls and otherwise diffuse hypokinesis and and grade 2 diastolic dysfunction.  - Euvolemic on exam. - No need for any diuretics at this time. - Continue Irbesartan '300mg'$  daily and Coreg 3.'125mg'$  twice daily.  Dilated Ascending Thoracic Aorta Echo in 07/2021 showed mild dilatation of the ascending aorta measuring 42 mm.  - Will order repeat Echo to reassess this. If any increase in size, will order chest CTA for further evaluation.  Hypertension BP borderline in the office today. - Continue Irbesartan and Coreg as above.  - Asked patient to keep BP/HR log for 2 weeks and then send [place] a MyChart message with this information. If average BP above goal, may need to add Amlodipine. Heart rate in the 60s today so I don't know if we will have room to increase Coreg.  Hyperlipidemia Last lipid panel in our system in from 2021. LDL 74 at that time. - Continue Lipitor '80mg'$  daily. - Patient recently  had labs checked at the [place] and cholesterol was reportedly good. Wife thinks they have a copy of these labs at home. Asked her to check and send [place] a MyChart message with the  results.  Disposition: Follow up in 6 months. If stress test is abnormal, we will get him in sooner.   Medication Adjustments/Labs and Tests Ordered: Current medicines are reviewed at length with the patient today.  Concerns regarding medicines are outlined above.  Orders Placed This Encounter  Procedures   NM PET CT CARDIAC PERFUSION MULTI W/ABSOLUTE BLOODFLOW   TSH   Cardiac Stress Test: Informed Consent Details: Physician/Practitioner Attestation; Transcribe to consent form and obtain patient signature   EKG 12-Lead   ECHOCARDIOGRAM COMPLETE   No orders of the defined types were placed in this encounter.   Patient Instructions  Medication Instructions:  No Changes *If you need a refill on your cardiac medications before your next appointment, please call your pharmacy*   Lab Work: TSH Today If you have labs (blood work) drawn today and your tests are completely normal, you will receive your results only by: [place] (if you have MyChart) OR A paper copy in the mail If you have any lab test that is abnormal or we need to change your treatment, we will call you to review the results.   Testing/Procedures: [address] Your physician has requested that you have an echocardiogram. Echocardiography is a painless test that uses sound waves to create images of your heart. It provides your doctor with information about the size and shape of your heart and how well your heart's chambers and valves are working. This procedure takes approximately one hour. There are no restrictions for this procedure.    Follow-Up: At [facility], you and your health needs are our priority.  As part of our continuing mission to provide you with exceptional heart care, we have created designated Provider Care Teams.  These Care Teams include your primary Cardiologist (physician) and Advanced Practice Providers (APPs -  Physician Assistants and Nurse Practitioners) who all  work together to provide you with the care you need, when you need it.  We recommend signing up for the patient portal called "MyChart".  Sign up information is provided on this After Visit Summary.  MyChart is used to connect with patients for Virtual Visits (Telemedicine).  Patients are able to view lab/test results, encounter notes, upcoming appointments, etc.  Non-urgent messages can be sent to your provider as well.   To learn more about what you can do with MyChart, go to [email or web address].    Your next appointment:   6 month(s)  The format for your next appointment:   In Person  Provider:   [name], MD     Other Instructions How to Prepare for Your Cardiac PET/CT Stress Test:  1. Please do not take these medications before your test:   Medications that may interfere with the cardiac pharmacological stress agent (ex. nitrates or beta-blockers) the day of the exam. Theophylline containing medications for 12 hours. Dipyridamole 48 hours prior to the test. Your remaining medications may be taken with water.  2. Nothing to eat or drink, except water, 3 hours prior to arrival time.   NO caffeine/decaffeinated products, or chocolate 12 hours prior to arrival.  3. NO perfume, cologne or lotion  4. Total time is 1 to 2 hours; you may want to bring reading material for the waiting time.  5. Please report to  Admitting at the [facility] Main Entrance 30 minutes for your test.  [place], [place] [id number]  Diabetic Preparation:  Hold oral medications. You may take NPH and Lantus insulin. Do not take Humalog or Humulin R (Regular Insulin) the day of your test. Check blood sugars prior to leaving the house. If able to eat breakfast prior to 3 hour fasting, you may take all medications, including your insulin, Do not worry if you miss your breakfast dose of insulin - start at your next meal.  IF YOU THINK YOU MAY BE PREGNANT, OR ARE NURSING  PLEASE INFORM THE TECHNOLOGIST.  In preparation for your appointment, medication and supplies will be purchased.  Appointment availability is limited, so if you need to cancel or reschedule, please call the Radiology Department at [phone]  24 hours in advance to avoid a cancellation fee of $100.00  What to Expect After you Arrive:  Once you arrive and check in for your appointment, you will be taken to a preparation room within the Radiology Department.  A technologist or Nurse will obtain your medical history, verify that you are correctly prepped for the exam, and explain the procedure.  Afterwards,  an IV will be started in your arm and electrodes will be placed on your skin for EKG monitoring during the stress portion of the exam. Then you will be escorted to the PET/CT scanner.  There, staff will get you positioned on the scanner and obtain a blood pressure and EKG.  During the exam, you will continue to be connected to the EKG and blood pressure machines.  A small, safe amount of a radioactive tracer will be injected in your IV to obtain a series of pictures of your heart along with an injection of a stress agent.    After your Exam:  It is recommended that you eat a meal and drink a caffeinated beverage to counter act any effects of the stress agent.  Drink plenty of fluids for the remainder of the day and urinate frequently for the first couple of hours after the exam.  Your doctor will inform you of your test results within 7-10 business days.  For questions about your test or how to prepare for your test, please call: Marchia Bond, Cardiac Imaging Nurse Navigator  Gordy Clement, Cardiac Imaging Nurse Navigator Office: [phone]   Important Information About Sugar         Signed, Eppie Gibson  04/10/2022 5:52 PM    [place]

## 2022-04-10 NOTE — Patient Instructions (Signed)
Medication Instructions:  ?No Changes ?*If you need a refill on your cardiac medications before your next appointment, please call your pharmacy* ? ? ?Lab Work: ?TSH Today ?If you have labs (blood work) drawn today and your tests are completely normal, you will receive your results only by: ?MyChart Message (if you have MyChart) OR ?A paper copy in the mail ?If you have any lab test that is abnormal or we need to change your treatment, we will call you to review the results. ? ? ?Testing/Procedures: [address] ?Your physician has requested that you have an echocardiogram. Echocardiography is a painless test that uses sound waves to create images of your heart. It provides your doctor with information about the size and shape of your heart and how well your heart?s chambers and valves are working. This procedure takes approximately one hour. There are no restrictions for this procedure.  ? ? ?Follow-Up: ?At [facility], you and your health needs are our priority.  As part of our continuing mission to provide you with exceptional heart care, we have created designated Provider Care Teams.  These Care Teams include your primary Cardiologist (physician) and Advanced Practice Providers (APPs -  Physician Assistants and Nurse Practitioners) who all work together to provide you with the care you need, when you need it. ? ?We recommend signing up for the patient portal called "MyChart".  Sign up information is provided on this After Visit Summary.  MyChart is used to connect with patients for Virtual Visits (Telemedicine).  Patients are able to view lab/test results, encounter notes, upcoming appointments, etc.  Non-urgent messages can be sent to your provider as well.   ?To learn more about what you can do with MyChart, go to [email or web address].   ? ?Your next appointment:   ?6 month(s) ? ?The format for your next appointment:   ?In Person ? ?Provider:   ?[name], MD   ? ? ?Other  Instructions ?How to Prepare for Your Cardiac PET/CT Stress Test: ? ?1. Please do not take these medications before your test:  ? ?Medications that may interfere with the cardiac pharmacological stress agent (ex. nitrates or beta-blockers) the day of the exam. ?Theophylline containing medications for 12 hours. ?Dipyridamole 48 hours prior to the test. ?Your remaining medications may be taken with water. ? ?2. Nothing to eat or drink, except water, 3 hours prior to arrival time.   ?NO caffeine/decaffeinated products, or chocolate 12 hours prior to arrival. ? ?3. NO perfume, cologne or lotion ? ?4. Total time is 1 to 2 hours; you may want to bring reading material for the waiting time. ? ?5. Please report to Admitting at the [place] Entr[facility] 30 minutes for your test. ? [facility] ? [place], [place] [address] ? ?Diabetic Preparation:  ?Hold oral medications. ?You may take NPH and Lantus insulin. ?Do not take Humalog or Humulin R (Regular Insulin) the day of your test. ?Check blood sugars prior to leaving the house. ?If able to eat breakfast prior to 3 hour fasting, you may take all medications, including your insulin, ?Do not worry if you miss your breakfast dose of insulin - start at your next meal. ? ?IF YOU THINK YOU MAY BE PREGNANT, OR ARE NURSING PLEASE INFORM THE TECHNOLOGIST. ? ?In preparation for your appointment, medication and supplies will be purchased.  Appointment availability is limited, so if you need to cancel or reschedule, please call the Radiology Department at [phone]  24  hours in advance to avoid a cancellation fee of $100.00 ? ?What to Expect After you Arrive: ? ?Once you arrive and check in for your appointment, you will be taken to a preparation room within the Radiology Department.  A technologist or Nurse will obtain your medical history, verify that you are correctly prepped for the exam, and explain the procedure.  Afterwards,  an IV will be started in your arm  and electrodes will be placed on your skin for EKG monitoring during the stress portion of the exam. Then you will be escorted to the PET/CT scanner.  There, staff will get you positioned on the scanner and obtain a blood pressure and EKG.  During the exam, you will continue to be connected to the EKG and blood pressure machines.  A small, safe amount of a radioactive tracer will be injected in your IV to obtain a series of pictures of your heart along with an injection of a stress agent.   ? ?After your Exam: ? ?It is recommended that you eat a meal and drink a caffeinated beverage to counter act any effects of the stress agent.  Drink plenty of fluids for the remainder of the day and urinate frequently for the first couple of hours after the exam.  Your doctor will inform you of your test results within 7-10 business days. ? ?For questions about your test or how to prepare for your test, please call: ?Marchia Bond, Cardiac Imaging Nurse Navigator  ?Gordy Clement, Cardiac Imaging Nurse Navigator ?Office: [phone]  ? ?Important Information About Sugar ? ? ? ? ?  ?

## 2022-04-11 ENCOUNTER — Encounter: Payer: Self-pay | Admitting: Podiatry

## 2022-04-11 ENCOUNTER — Ambulatory Visit (HOSPITAL_COMMUNITY): Payer: No Typology Code available for payment source | Attending: Family Medicine

## 2022-04-11 ENCOUNTER — Ambulatory Visit: Payer: Medicare HMO | Admitting: Podiatry

## 2022-04-11 DIAGNOSIS — M5459 Other low back pain: Secondary | ICD-10-CM | POA: Diagnosis present

## 2022-04-11 DIAGNOSIS — M19072 Primary osteoarthritis, left ankle and foot: Secondary | ICD-10-CM | POA: Diagnosis not present

## 2022-04-11 DIAGNOSIS — M7752 Other enthesopathy of left foot: Secondary | ICD-10-CM

## 2022-04-11 DIAGNOSIS — M19079 Primary osteoarthritis, unspecified ankle and foot: Secondary | ICD-10-CM | POA: Diagnosis not present

## 2022-04-11 DIAGNOSIS — M47816 Spondylosis without myelopathy or radiculopathy, lumbar region: Secondary | ICD-10-CM | POA: Insufficient documentation

## 2022-04-11 LAB — TSH: TSH: 2.42 u[IU]/mL (ref 0.450–4.500)

## 2022-04-14 NOTE — Progress Notes (Signed)
He presents today for follow-up of his orthotics and his brace.  He states that really nothing seems to be helping. ? ?Objective: Vital signs are stable alert oriented x3.  Pulses are palpable.  He is has severe pain on palpation and range of motion of the ankle joint as well as the subtalar joint and midfoot. ? 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 ? ?Plan: At this point we are requesting MRI of this foot.  Evaluate midfoot and ankle joint. ?

## 2022-04-17 ENCOUNTER — Telehealth: Payer: Self-pay | Admitting: *Deleted

## 2022-04-17 NOTE — Telephone Encounter (Signed)
Thank you :)

## 2022-04-17 NOTE — Telephone Encounter (Signed)
Patient's wife is calling because they want to go to [facility] instead of [facility]. ?Will refax the order to them to schedule there. ?

## 2022-04-18 ENCOUNTER — Encounter (HOSPITAL_COMMUNITY): Payer: Self-pay

## 2022-04-18 ENCOUNTER — Telehealth (HOSPITAL_COMMUNITY): Payer: Self-pay

## 2022-04-18 NOTE — Telephone Encounter (Signed)
Patient came by he will see a MD in [place] and wants to be d/c from our office ?

## 2022-04-18 NOTE — Therapy (Signed)
PHYSICAL THERAPY DISCHARGE SUMMARY ? ?Visits from Start of Care: 1 ? ?Current functional level related to goals / functional outcomes: ?Unknown patient did not keep further appointments after evaluation visit.  ?  ?Remaining deficits: ?Unknown patient did not keep further appointments after evaluation visit ?  ?Education / Equipment: ?HEP from evaluation.   ? ?Patient agrees to discharge. Patient goals were not met. Patient is being discharged due to the patient's request.  He states he is not having any further pain or issues at this time.  ? ?

## 2022-04-23 ENCOUNTER — Telehealth: Payer: Self-pay | Admitting: Podiatry

## 2022-04-23 NOTE — Telephone Encounter (Signed)
Pt wife has not heard from [facility] for the mri to be scheduled. Can you pleas check status and let her know. ?

## 2022-04-24 ENCOUNTER — Ambulatory Visit (HOSPITAL_COMMUNITY)
Admission: RE | Admit: 2022-04-24 | Discharge: 2022-04-24 | Disposition: A | Discharge status: Home / Self Care | Payer: Medicare HMO | Source: Ambulatory Visit | Attending: Student | Admitting: Student

## 2022-04-24 DIAGNOSIS — I2510 Atherosclerotic heart disease of native coronary artery without angina pectoris: Secondary | ICD-10-CM | POA: Insufficient documentation

## 2022-04-24 DIAGNOSIS — R5383 Other fatigue: Secondary | ICD-10-CM

## 2022-04-24 DIAGNOSIS — I10 Essential (primary) hypertension: Secondary | ICD-10-CM | POA: Diagnosis not present

## 2022-04-24 DIAGNOSIS — I255 Ischemic cardiomyopathy: Secondary | ICD-10-CM | POA: Diagnosis not present

## 2022-04-24 DIAGNOSIS — E785 Hyperlipidemia, unspecified: Secondary | ICD-10-CM

## 2022-04-24 DIAGNOSIS — I77810 Thoracic aortic ectasia: Secondary | ICD-10-CM

## 2022-04-24 LAB — ECHOCARDIOGRAM COMPLETE
Area-P 1/2: 2.15 cm2
MV M vel: 6.01 m/s
MV Peak grad: 144.5 mmHg
S' Lateral: 3 cm

## 2022-04-24 NOTE — Progress Notes (Signed)
*  PRELIMINARY RESULTS* ?Echocardiogram ?2D Echocardiogram has been performed. ? ?Cory [name] ?04/24/2022, 11:15 AM ?

## 2022-04-26 NOTE — Progress Notes (Signed)
Pt is scheduled for PET scan 04-30-22'@9am'$  ?

## 2022-04-29 ENCOUNTER — Telehealth (HOSPITAL_COMMUNITY): Payer: Self-pay | Admitting: *Deleted

## 2022-04-29 NOTE — Telephone Encounter (Signed)
Patient returning call regarding upcoming cardiac imaging study; pt verbalizes understanding of appt date/time, parking situation and where to check in, pre-test NPO status; name and call back number provided for further questions should they arise ? ?Gordy Clement RN Navigator Cardiac Imaging ?[facility] ?[phone] office ?[phone] cell ? ?Patient aware to avoid caffeine 12 hours prior to scan. ?

## 2022-04-29 NOTE — Telephone Encounter (Signed)
Attempted to call patient regarding upcoming cardiac PET appointment. Reminded patient to not consume caffeine after 9pm this evening. ?Left message on voicemail with name and callback number ? ?Gordy Clement RN Navigator Cardiac Imaging ?[facility] ?[phone] Office ?[phone] Cell ? ?

## 2022-04-30 ENCOUNTER — Encounter (HOSPITAL_COMMUNITY)
Admission: RE | Admit: 2022-04-30 | Discharge: 2022-04-30 | Disposition: A | Discharge status: Home / Self Care | Payer: Medicare HMO | Source: Ambulatory Visit | Attending: Student | Admitting: Student

## 2022-04-30 DIAGNOSIS — I2510 Atherosclerotic heart disease of native coronary artery without angina pectoris: Secondary | ICD-10-CM

## 2022-04-30 DIAGNOSIS — R5383 Other fatigue: Secondary | ICD-10-CM | POA: Insufficient documentation

## 2022-04-30 DIAGNOSIS — I255 Ischemic cardiomyopathy: Secondary | ICD-10-CM | POA: Diagnosis not present

## 2022-04-30 DIAGNOSIS — E785 Hyperlipidemia, unspecified: Secondary | ICD-10-CM

## 2022-04-30 DIAGNOSIS — I77810 Thoracic aortic ectasia: Secondary | ICD-10-CM | POA: Diagnosis not present

## 2022-04-30 DIAGNOSIS — I10 Essential (primary) hypertension: Secondary | ICD-10-CM | POA: Diagnosis not present

## 2022-04-30 MED ORDER — REGADENOSON 0.4 MG/5ML IV SOLN
INTRAVENOUS | Status: AC
  Administered 2022-04-30: 0.4 mg via INTRAVENOUS
  Filled 2022-04-30: qty 5

## 2022-04-30 MED ORDER — RUBIDIUM RB82 GENERATOR (RUBYFILL)
22.4400 | PACK | Freq: Once | INTRAVENOUS | Status: AC
  Administered 2022-04-30: 22.44 via INTRAVENOUS

## 2022-04-30 MED ORDER — RUBIDIUM RB82 GENERATOR (RUBYFILL)
24.0800 | PACK | Freq: Once | INTRAVENOUS | Status: AC
  Administered 2022-04-30: 22.45 via INTRAVENOUS

## 2022-05-01 ENCOUNTER — Encounter (HOSPITAL_COMMUNITY): Payer: Self-pay

## 2022-05-01 ENCOUNTER — Ambulatory Visit (HOSPITAL_COMMUNITY): Payer: No Typology Code available for payment source

## 2022-05-01 LAB — NM PET CT CARDIAC PERFUSION MULTI W/ABSOLUTE BLOODFLOW
Base ST Depression (mm): 0 mm
LV dias vol: 142 mL (ref 62–150)
LV sys vol: 71 mL
MBFR: 2.1
Nuc Rest EF: 47 %
Nuc Stress EF: 50 %
Peak HR: 89 {beats}/min
Rest HR: 61 {beats}/min
Rest MBF: 0.77 ml/g/min
Rest Nuclear Isotope Dose: 22.5 mCi
Rest perfusion cavity size (mL): 144 mL
SDS: 2
SRS: 4
SSS: 6
ST Depression (mm): 0 mm
Stress MBF: 1.62 ml/g/min
Stress Nuclear Isotope Dose: 22.4 mCi
Stress perfusion cavity size (mL): 142 mL
TID: 1.06

## 2022-05-03 ENCOUNTER — Telehealth: Payer: Self-pay | Admitting: *Deleted

## 2022-05-03 NOTE — Telephone Encounter (Signed)
Called insurance and given prior authorization([id number]) valid from 05/02/22-06/02/22,contact:Tiesha T. Called and notified [facility] pre [facility], patient is scheduled 05/07/22@ 9:00. ?

## 2022-05-07 ENCOUNTER — Ambulatory Visit (HOSPITAL_COMMUNITY)
Admission: RE | Admit: 2022-05-07 | Discharge: 2022-05-07 | Disposition: A | Discharge status: Home / Self Care | Payer: Medicare HMO | Source: Ambulatory Visit | Attending: Podiatry | Admitting: Podiatry

## 2022-05-07 DIAGNOSIS — M722 Plantar fascial fibromatosis: Secondary | ICD-10-CM | POA: Diagnosis not present

## 2022-05-07 DIAGNOSIS — M19072 Primary osteoarthritis, left ankle and foot: Secondary | ICD-10-CM | POA: Diagnosis not present

## 2022-05-07 DIAGNOSIS — M19079 Primary osteoarthritis, unspecified ankle and foot: Secondary | ICD-10-CM | POA: Diagnosis not present

## 2022-05-07 DIAGNOSIS — M65872 Other synovitis and tenosynovitis, left ankle and foot: Secondary | ICD-10-CM | POA: Diagnosis not present

## 2022-05-07 DIAGNOSIS — M7732 Calcaneal spur, left foot: Secondary | ICD-10-CM | POA: Diagnosis not present

## 2022-05-13 DIAGNOSIS — D239 Other benign neoplasm of skin, unspecified: Secondary | ICD-10-CM | POA: Diagnosis not present

## 2022-05-13 DIAGNOSIS — L570 Actinic keratosis: Secondary | ICD-10-CM | POA: Diagnosis not present

## 2022-05-13 DIAGNOSIS — Z1283 Encounter for screening for malignant neoplasm of skin: Secondary | ICD-10-CM | POA: Diagnosis not present

## 2022-05-14 ENCOUNTER — Ambulatory Visit: Payer: Medicare HMO | Admitting: Podiatry

## 2022-05-14 DIAGNOSIS — M7752 Other enthesopathy of left foot: Secondary | ICD-10-CM | POA: Diagnosis not present

## 2022-05-14 MED ORDER — TRIAMCINOLONE ACETONIDE 40 MG/ML IJ SUSP
20.0000 mg | Freq: Once | INTRAMUSCULAR | Status: AC
  Administered 2022-05-14: 20 mg

## 2022-05-14 NOTE — Progress Notes (Signed)
He presents today for follow-up of his MRI.  He states that his foot is really not doing any better. ? ?Objective: Vital signs stable alert oriented x3 MRI does demonstrate a tear of the peroneus longus tendon.  There is no reproduction of pain on palpation of the peroneus longus or brevis tendon.  The majority of his pain is found in the subtalar joint where the MRI does demonstrate some osteoarthritic change. ? ?Assessment: Osteoarthritis left subtalar joint. ? ?Plan: Injected Kenalog 10 mg subtalar joint left follow-up with him in 3 to 4 months ?

## 2022-05-15 ENCOUNTER — Ambulatory Visit: Payer: Non-veteran care | Admitting: Student

## 2022-05-21 NOTE — Telephone Encounter (Signed)
Mri completed 05/07/22

## 2022-06-10 ENCOUNTER — Ambulatory Visit (INDEPENDENT_AMBULATORY_CARE_PROVIDER_SITE_OTHER): Payer: Non-veteran care | Admitting: Gastroenterology

## 2022-06-10 ENCOUNTER — Encounter (INDEPENDENT_AMBULATORY_CARE_PROVIDER_SITE_OTHER): Payer: Self-pay | Admitting: Gastroenterology

## 2022-06-10 VITALS — BP 151/92 | HR 90 | Temp 97.8°F | Ht 69.0 in | Wt 184.6 lb

## 2022-06-10 DIAGNOSIS — R5383 Other fatigue: Secondary | ICD-10-CM

## 2022-06-10 DIAGNOSIS — D509 Iron deficiency anemia, unspecified: Secondary | ICD-10-CM | POA: Diagnosis not present

## 2022-06-10 DIAGNOSIS — K219 Gastro-esophageal reflux disease without esophagitis: Secondary | ICD-10-CM | POA: Diagnosis not present

## 2022-06-10 DIAGNOSIS — K2270 Barrett's esophagus without dysplasia: Secondary | ICD-10-CM | POA: Diagnosis not present

## 2022-06-10 NOTE — Patient Instructions (Addendum)
Perform blood workup If normal iron stores, will stop oral iron Follow up with PCP regarding fatigue Continue pantoprazole 40 mg qday

## 2022-06-10 NOTE — Progress Notes (Signed)
[name], M.D. Gastroenterology & Hepatology [facility] [address] [place], [place] [address]  Primary Care Physician: Sharilyn Sites, MD [address] [place] [address]  I will communicate my assessment and recommendations to the referring MD via EMR.  Problems: Iron deficiency anemia Barrett's esophagus w/o dysplasia Possible diverticular bleeding  History of Present Illness: Cory [name] is a 76 y.o. male with past medical history of iron deficiency anemia, coronary artery disease status post stent placement, GERD, hyperlipidemia, hypertension, myocardial infarction, who presents for evaluation of fatigue.  The patient was last seen on 06/07/2021. At that time, the patient was advised to continue taking oral iron and to decrease pantoprazole to 40 mg every day.  Was ordered repeat CBC and iron stores, which he performed on the same day and showed normal hemoglobin of 15.5 and normal iron stores.  Patient reports that he has felt tired frequently, has felt this for the last 2 months.He has been taking Vitamin C pills which he fees have been helping him some. He saw his PCP, and states he got "a vitamin booster shot" which also helped him increase his energy.  He is still taking the oral iron once a day. The patient denies having any nausea, vomiting, fever, chills, hematochezia, melena, hematemesis, abdominal distention, abdominal pain, diarrhea, jaundice, pruritus. Reports gaining 5 lb for the last few months.  Last TSH was 2.42 in 03/2022. Last CBC uin 12/2021 showed a Hb 15.3 WBC 5.4 PLT 194, CMP was WNL but no iron stores checked.  Last EGD:04/10/2020, 2 cm hiatal hernia, there were changes concerning for short segment Barrett's esophagus (1 cm in length).  Biopsies were consistent with Barrett's esophagus without dysplasia, normal stomach and duodenum.  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 No other abnormalities noted to small bowel mucosa.  Past Medical History: Past Medical History:  Diagnosis Date   Anemia    Arthritis    Coronary artery disease    a. 09/2001 PCI/BMS to D1: 2.5 x 15 Express 2 BMS;  b. 2011 Cath: stable anatomy; c. 03/2014 Low risk MV, EF 54%;  d. 09/2016 Inf STEMI/PCI [facility]): LM nl, LAD 35m D1 nl w/ 90% in 14minf branch, LCX nl, RCA 99 (Synergy DES x 4 - 3.5x28 prox, 3.5x3868m.5x16d, 2.25x16 to RPDA), EF 55%.   GERD (gastroesophageal reflux disease)    Hyperlipidemia    Hypertensive heart disease    [facility]   Pneumonia    PTSD (post-traumatic stress disorder)     Past Surgical History: Past Surgical History:  Procedure Laterality Date   BIOPSY  04/10/2020   Procedure: BIOPSY;  Surgeon: RehRogene HoustonD;  [facility];  Service: Endoscopy;;  distal, esophageal   CARDIAC CATHETERIZATION  05/17/2010   left main free of significant lesion; LAD with 60% lesion in mid segment & 60-70% lesion in mid-distal segment with subtotal occlusion at apex & small side branch of diagonal 1 that is subtotally occluded; ramus intermedius with 90% ostial stenosis; L Cfx w/mild luminal irreg; RCA is dominant vessel with diffuse disease, 50% prox lesion & 60% lesion in mid segment & 60% lesion in PDA   CATARACT EXTRACTION W/PHACO Right 06/07/2015   Procedure: CATARACT EXTRACTION PHACO AND INTRAOCULAR LENS PLACEMENT ([place] Surgeon: RoyMarylynn PearsonD;  [facility];  Laterality: Right;  COLONOSCOPY  02/25/2012   Procedure: COLONOSCOPY;  Surgeon: Jamesetta So, MD;  [facility];  Service: Gastroenterology;  Laterality: N/A;   COLONOSCOPY N/A 04/11/2020   Procedure: COLONOSCOPY;  Surgeon: Rogene Houston, MD;  [facility];  Service: Endoscopy;  Laterality: N/A;    CORONARY ANGIOPLASTY WITH STENT PLACEMENT  09/2001   Taxus (2.5x80m) stent to 1st diagonal    ESOPHAGEAL DILATION N/A 08/14/2016   Procedure: ESOPHAGEAL DILATION;  Surgeon: NRogene Houston MD;  [facility];  Service: Endoscopy;  Laterality: N/A;   ESOPHAGOGASTRODUODENOSCOPY  02/25/2012   Procedure: ESOPHAGOGASTRODUODENOSCOPY (EGD);  Surgeon: MJamesetta So MD;  [facility];  Service: Gastroenterology;  Laterality: N/A;   ESOPHAGOGASTRODUODENOSCOPY N/A 07/28/2014   Procedure: ESOPHAGOGASTRODUODENOSCOPY (EGD);  Surgeon: NRogene Houston MD;  [facility];  Service: Endoscopy;  Laterality: N/A;  200-rescheduled to 268Ann notified pt   ESOPHAGOGASTRODUODENOSCOPY N/A 08/14/2016   Procedure: ESOPHAGOGASTRODUODENOSCOPY (EGD);  Surgeon: NRogene Houston MD;  [facility];  Service: Endoscopy;  Laterality: N/A;  100   ESOPHAGOGASTRODUODENOSCOPY N/A 04/10/2020   Procedure: ESOPHAGOGASTRODUODENOSCOPY (EGD);  Surgeon: RRogene Houston MD;  [facility];  Service: Endoscopy;  Laterality: N/A;   EYE SURGERY Bilateral    lasik surgery    GIVENS CAPSULE STUDY N/A 04/12/2020   Procedure: GIVENS CAPSULE STUDY;  Surgeon: RRogene Houston MD;  [facility];  Service: Endoscopy;  Laterality: N/A;   KNEE ARTHROSCOPY WITH MEDIAL MENISECTOMY Left 02/05/2018   Procedure: LEFT KNEE ARTHROSCOPY WITH PARTIAL MEDIAL MENISECTOMY;  Surgeon: HCarole Civil MD;  Location: [facility];  Service: Orthopedics;  Laterality: Left;   KNEE SURGERY     LATERAL EPICONDYLE RELEASE Left 01/21/2014   Procedure: Left Tennis Elbow Release with debridement  tendon repair with reattachment;  Surgeon: SCarole Civil MD;  Location: [facility];  Service: Orthopedics;  Laterality: Left;   penile implant     PROSTATE SURGERY     SHOULDER SURGERY     TONSILLECTOMY     TOTAL KNEE ARTHROPLASTY Left 10/19/2018   Procedure: LEFT TOTAL KNEE ARTHROPLASTY;  Surgeon: LVickey Huger MD;   Location: [facility];  Service: Orthopedics;  Laterality: Left;  with abductor block failed spinal   TRANSTHORACIC ECHOCARDIOGRAM  03/2010   EF=>55%; LV mildly dilated; LA mod dilated; mild MR & TR; normal RVSP; mild pulm valve regurg    Family History: Family History  Problem Relation Age of Onset   Colitis Maternal Aunt     Social History: Social History   Tobacco Use  Smoking Status Never  Smokeless Tobacco Never   Social History   Substance and Sexual Activity  Alcohol Use Yes   Alcohol/week: 1.0 standard drink of alcohol   Types: 1 Glasses of wine per week   Comment: nightly to help sleep   Social History   Substance and Sexual Activity  Drug Use No    Allergies: Allergies  Allergen Reactions   Ace Inhibitors Other (See Comments)    unknown   Benadryl [Diphenhydramine Hcl] Other (See Comments)    Opposite reaction of medication purpose - hyper   Vioxx [Rofecoxib] Other (See Comments)    headache    Medications: Current Outpatient Medications  Medication Sig Dispense Refill   acetaminophen (TYLENOL) 325 MG tablet Take 650 mg by mouth daily as needed for moderate pain or headache.     aspirin EC 81 MG tablet Take 81 mg by  mouth daily. Swallow whole.     atorvastatin (LIPITOR) 80 MG tablet Take 1 tablet (80 mg total) by mouth daily at 6 PM. 90 tablet 3   buPROPion (WELLBUTRIN SR) 100 MG 12 hr tablet Take 100 mg by mouth daily.     carvedilol (COREG) 3.125 MG tablet Take 1 tablet (3.125 mg total) by mouth 2 (two) times daily. 180 tablet 3   ferrous sulfate 325 (65 FE) MG tablet Take 1 tablet (325 mg total) by mouth daily with breakfast.     fluticasone (FLONASE) 50 MCG/ACT nasal spray Place 1 spray into both nostrils daily as needed for allergies or rhinitis.     hydrOXYzine (ATARAX/VISTARIL) 10 MG tablet Take 10 mg by mouth at bedtime as needed. 1 tab HS PRN, 1-2 tabs PRN for anxiety.     irbesartan (AVAPRO) 300 MG tablet Take 1 tablet (300 mg total) by mouth  daily. PATIENT NEED TO SCHEDULE APPOINTMENT FOR FUTURE REFILLS. Take 1 tablet (300 mg total) by mouth daily. 90 tablet 0   loratadine (CLARITIN) 10 MG tablet Take 10 mg by mouth daily.     Magnesium Malate 1250 (141.7 Mg) MG TABS Take 1,250 mg by mouth every evening.      pantoprazole (PROTONIX) 40 MG tablet Take 1 tablet (40 mg total) by mouth 2 (two) times daily before a meal. 180 tablet 1   traZODone (DESYREL) 100 MG tablet Take 150 mg by mouth at bedtime. 150 mg QHS     VITAMIN D PO Take 25 mg by mouth daily.     No current facility-administered medications for this visit.    Review of Systems: GENERAL: negative for malaise, night sweats HEENT: No changes in hearing or vision, no nose bleeds or other nasal problems. NECK: Negative for lumps, goiter, pain and significant neck swelling RESPIRATORY: Negative for cough, wheezing CARDIOVASCULAR: Negative for chest pain, leg swelling, palpitations, orthopnea GI: SEE HPI MUSCULOSKELETAL: Negative for joint pain or swelling, back pain, and muscle pain. SKIN: Negative for lesions, rash PSYCH: Negative for sleep disturbance, mood disorder and recent psychosocial stressors. HEMATOLOGY Negative for prolonged bleeding, bruising easily, and swollen nodes. ENDOCRINE: Negative for cold or heat intolerance, polyuria, polydipsia and goiter. NEURO: negative for tremor, gait imbalance, syncope and seizures. The remainder of the review of systems is noncontributory.   Physical Exam: BP (!) 151/92 (BP Location: Left Arm, Patient Position: Sitting, Cuff Size: Small)   Pulse 90   Temp 97.8 F (36.6 C) (Oral)   Ht '5\' 9"'$  (1.753 m)   Wt 184 lb 9.6 oz (83.7 kg)   BMI 27.26 kg/m  GENERAL: The patient is AO x3, in no acute distress. HEENT: Head is normocephalic and atraumatic. EOMI are intact. Mouth is well hydrated and without lesions. NECK: Supple. No masses LUNGS: Clear to auscultation. No presence of rhonchi/wheezing/rales. Adequate chest  expansion HEART: RRR, normal s1 and s2. ABDOMEN: Soft, nontender, no guarding, no peritoneal signs, and nondistended. BS +. No masses. EXTREMITIES: Without any cyanosis, clubbing, rash, lesions or edema. NEUROLOGIC: AOx3, no focal motor deficit. SKIN: no jaundice, no rashes  Imaging/Labs: as above  I personally reviewed and interpreted the available labs, imaging and endoscopic files.  Impression and Plan: Cory [name] is a 76 y.o. male with past medical history of iron deficiency anemia, coronary artery disease status post stent placement, GERD, hyperlipidemia, hypertension, myocardial infarction, who presents for evaluation of fatigue.  The patient has not presented any overt clinical episodes of gastrointestinal bleeding.  Notably  his most recent hemoglobin was normal which makes it less likely that his fatigue is related to any ongoing gastrointestinal losses.  We will recheck a CBC and iron stores but will also look for other etiologies such as low cortisol or testosterone levels.  If his iron stores were be in the normal range he can stop taking his oral iron.  I encouraged him to follow with his PCP for further evaluation of his chronic fatigue.  Finally, his GERD related symptoms are well controlled while on pantoprazole 40 mg daily which she should continue.  He is due for surveillance EGD in 1 year.  -Check fasting CBC, iron stores, cortisol and testosterone - If normal iron stores, will stop oral iron - Follow up with PCP regarding fatigue - Continue pantoprazole 40 mg qday -Repeat EGD in 2024  All questions were answered.      Harvel Quale, MD Gastroenterology and Hepatology [facility]

## 2022-06-11 DIAGNOSIS — D509 Iron deficiency anemia, unspecified: Secondary | ICD-10-CM | POA: Diagnosis not present

## 2022-06-11 DIAGNOSIS — R5383 Other fatigue: Secondary | ICD-10-CM | POA: Diagnosis not present

## 2022-06-14 LAB — CBC WITH DIFFERENTIAL/PLATELET
Basophils Absolute: 0 10*3/uL (ref 0.0–0.2)
Basos: 1 %
EOS (ABSOLUTE): 0.2 10*3/uL (ref 0.0–0.4)
Eos: 3 %
Hematocrit: 42.4 % (ref 37.5–51.0)
Hemoglobin: 14.6 g/dL (ref 13.0–17.7)
Immature Grans (Abs): 0 10*3/uL (ref 0.0–0.1)
Immature Granulocytes: 1 %
Lymphocytes Absolute: 1.7 10*3/uL (ref 0.7–3.1)
Lymphs: 30 %
MCH: 30.8 pg (ref 26.6–33.0)
MCHC: 34.4 g/dL (ref 31.5–35.7)
MCV: 90 fL (ref 79–97)
Monocytes Absolute: 0.6 10*3/uL (ref 0.1–0.9)
Monocytes: 10 %
Neutrophils Absolute: 3.1 10*3/uL (ref 1.4–7.0)
Neutrophils: 55 %
Platelets: 190 10*3/uL (ref 150–450)
RBC: 4.74 x10E6/uL (ref 4.14–5.80)
RDW: 12.7 % (ref 11.6–15.4)
WBC: 5.5 10*3/uL (ref 3.4–10.8)

## 2022-06-14 LAB — IRON,TIBC AND FERRITIN PANEL
Ferritin: 207 ng/mL (ref 30–400)
Iron Saturation: 28 % (ref 15–55)
Iron: 71 ug/dL (ref 38–169)
Total Iron Binding Capacity: 253 ug/dL (ref 250–450)
UIBC: 182 ug/dL (ref 111–343)

## 2022-06-14 LAB — TESTOSTERONE,FREE AND TOTAL
Testosterone, Free: 2.5 pg/mL — ABNORMAL LOW (ref 6.6–18.1)
Testosterone: 431 ng/dL (ref 264–916)

## 2022-06-14 LAB — CORTISOL-AM, BLOOD: Cortisol - AM: 8.8 ug/dL (ref 6.2–19.4)

## 2022-07-29 DIAGNOSIS — T2124XA Burn of second degree of lower back, initial encounter: Secondary | ICD-10-CM | POA: Diagnosis not present

## 2022-08-13 ENCOUNTER — Encounter: Payer: Self-pay | Admitting: Podiatry

## 2022-08-13 ENCOUNTER — Ambulatory Visit: Payer: Medicare HMO | Admitting: Podiatry

## 2022-08-13 DIAGNOSIS — M7752 Other enthesopathy of left foot: Secondary | ICD-10-CM

## 2022-08-13 MED ORDER — TRIAMCINOLONE ACETONIDE 40 MG/ML IJ SUSP: 20.0000 mg | Freq: Once | INTRAMUSCULAR | Status: AC

## 2022-08-13 NOTE — Progress Notes (Signed)
He presents today for follow-up of his subtalar joint capsulitis last time he was here was in May states that he was doing quite well for for quite some time states that his regular tennis shoes without the orthotics feel better on his subtalar joint than those with his orthotics.  Objective: Vital signs are stable alert oriented x3 still has pain on palpation of the sinus tarsi and end range of motion of the subtalar joint though minimally so today.  Assessment: Resolving capsulitis subtalar joint left.  Plan: Instructed him to try to wear tennis shoes without the orthotics for a while to see if he does better with that and also injected the area today 20 mg Kenalog 5 mg Marcaine extra Betadine skin prep to the sinus tarsi into the subtalar joint.  Tolerated the procedure well we will follow-up with him on an as-needed basis.

## 2022-09-04 DIAGNOSIS — Z0001 Encounter for general adult medical examination with abnormal findings: Secondary | ICD-10-CM | POA: Diagnosis not present

## 2022-09-04 DIAGNOSIS — E782 Mixed hyperlipidemia: Secondary | ICD-10-CM | POA: Diagnosis not present

## 2022-09-04 DIAGNOSIS — I219 Acute myocardial infarction, unspecified: Secondary | ICD-10-CM | POA: Diagnosis not present

## 2022-09-04 DIAGNOSIS — F4312 Post-traumatic stress disorder, chronic: Secondary | ICD-10-CM | POA: Diagnosis not present

## 2022-09-04 DIAGNOSIS — Z6827 Body mass index (BMI) 27.0-27.9, adult: Secondary | ICD-10-CM | POA: Diagnosis not present

## 2022-09-04 DIAGNOSIS — I10 Essential (primary) hypertension: Secondary | ICD-10-CM | POA: Diagnosis not present

## 2022-09-04 DIAGNOSIS — Z1331 Encounter for screening for depression: Secondary | ICD-10-CM | POA: Diagnosis not present

## 2022-09-04 DIAGNOSIS — D509 Iron deficiency anemia, unspecified: Secondary | ICD-10-CM | POA: Diagnosis not present

## 2022-09-04 DIAGNOSIS — E7849 Other hyperlipidemia: Secondary | ICD-10-CM | POA: Diagnosis not present

## 2022-09-04 DIAGNOSIS — Z125 Encounter for screening for malignant neoplasm of prostate: Secondary | ICD-10-CM | POA: Diagnosis not present

## 2022-09-04 DIAGNOSIS — I2510 Atherosclerotic heart disease of native coronary artery without angina pectoris: Secondary | ICD-10-CM | POA: Diagnosis not present

## 2022-11-01 ENCOUNTER — Encounter: Payer: Self-pay | Admitting: Internal Medicine

## 2022-11-01 ENCOUNTER — Ambulatory Visit: Payer: Non-veteran care | Attending: Internal Medicine | Admitting: Internal Medicine

## 2022-11-01 VITALS — BP 126/90 | HR 55 | Ht 69.75 in | Wt 188.8 lb

## 2022-11-01 DIAGNOSIS — R5383 Other fatigue: Secondary | ICD-10-CM | POA: Diagnosis not present

## 2022-11-01 DIAGNOSIS — E538 Deficiency of other specified B group vitamins: Secondary | ICD-10-CM | POA: Diagnosis not present

## 2022-11-01 DIAGNOSIS — Z6828 Body mass index (BMI) 28.0-28.9, adult: Secondary | ICD-10-CM | POA: Diagnosis not present

## 2022-11-01 DIAGNOSIS — I10 Essential (primary) hypertension: Secondary | ICD-10-CM | POA: Diagnosis not present

## 2022-11-01 DIAGNOSIS — I2510 Atherosclerotic heart disease of native coronary artery without angina pectoris: Secondary | ICD-10-CM

## 2022-11-01 DIAGNOSIS — I77810 Thoracic aortic ectasia: Secondary | ICD-10-CM | POA: Diagnosis not present

## 2022-11-01 DIAGNOSIS — I255 Ischemic cardiomyopathy: Secondary | ICD-10-CM | POA: Diagnosis not present

## 2022-11-01 DIAGNOSIS — R350 Frequency of micturition: Secondary | ICD-10-CM | POA: Diagnosis not present

## 2022-11-01 NOTE — Progress Notes (Signed)
OFFICE NOTE  Chief Complaint:  Routine follow-up  Primary Care Physician: Sharilyn Sites, MD  HPI:  Cory [name] is a 76 year old gentleman, who I have been following for coronary artery disease, status post Taxus stenting in 2002. He also has had some ongoing chronic angina controlled on Imdur 60 mg. He also has PTSD, on a number of medications, but that has been stable, as well as hypertension. Today he has a few different complaints, including some increasing urinary frequency. He does have a history of BPH and is followed at the [place] for this. Sounds like he may have had a TURP procedure recently. He is not on any medications for his prostate. In addition he is describing fatigue. This is a new symptom for him and reports he is sleeping much more regularly and often than he did in the past. He's not exercising as much as he had been before. Of note he is on 3 different antidepressant medications for his PTSD, again managed by the [facility].  He reports fairly stable exertional angina however at times he does get more short of breath with exercise. His last stress test was in 2011.  Mr. Cory [name] returns today for follow-up. He is without complaint. His blood pressure appears to be well-controlled. He denies any chest pain and is actually had very little need to take any short acting nitroglycerin since he's been on Imdur. He remains active and has no shortness of breath.  01/08/2017  Mr. Cory [name] returns today for follow-up. He was seen in October by Murray Hodgkins, NP, for follow-up of an ST elevation MI. He was vacationing in the [facility] area and presented to [facility] hospital after having ST elevation. He underwent emergent Catheterization and was found to have subtotal occlusion of the right coronary artery. He received 4 drug-eluting stents and was discharged on Effient, aspirin, atenolol and simvastatin. Subsequent to that he is done fairly well without any recurrent chest pain.  He does get a little mild dyspnea with exertion. He did not participate in cardiac rehabilitation. He was noted to be markedly bradycardic and follow-up and his atenolol was discontinued. His simvastatin was discontinued in favor of a highly active statin, namely Lipitor 80 mg daily. A repeat lipid profile was performed on 12/04/2016 showing total cluster 105, triglycerides 63, HDL-C 49, and LDL-C 43. Is is excellent control and I would continue with his current dose. Also of note, his blood pressure is elevated today. He says that it has remained elevated since discontinuing his atenolol.  01/24/2017  Mr. Cory [name] returns for follow-up of his echo. Recently that demonstrated an EF of 45% which is reduced from his prior studies. This is related to his recent STEMI. Hopefully he'll have some recovery of his LV function in time. I did switch him from losartan 100 up to valsartan 320 since his blood pressure was not at goal. Now his blood pressure is much improved at 130/82. He feels really well other than he has some pain behind his left knee. He reports to stiffness and difficulty in extending it. He does not recall injuring it. He wondered if this is related to his cholesterol medicine. Of note he is been on a statin medication now for several months.  08/07/2017  Mr. Cory [name] was seen today in follow-up. Over the last few months she's had no new complaints. He denies any chest pain or worsening shortness of breath. He was seen about his knee pain and told he has arthritis.  His EF was 45% on echo and recently was subjected to a valsartan and recall. He was switched over to irbesartan and seems to be tolerating that well. Blood pressure is reasonably well controlled. He should be on dual antiplatelet therapy and at least until October 2018.  11/18/2017  Mr. Cory [name] returns today for follow-up.  Overall is without complaints.  EKG shows sinus rhythm at 60.  He is now 1 year since his ST elevation MI.  We had  discussed possibly discontinuing his Effient.  He denies any further chest pain.  He remains physically active.  05/21/2018  Mr. Cory [name] was seen today in follow-up.  He seems to be doing really well.  He denies any chest pain or worsening shortness of breath.  He had a repeat echo that unfortunately does not show any improvement in LVEF which is still 45% with inferior hypokinesis.  The LVEF however has not worsened.  He is on irbesartan, but not is not on a beta-blocker.  He takes aspirin monotherapy.  He had a lipid profile drawn this morning however the results are pending.   07/25/2020  Mr. Cory [name] is seen today in follow-up.  Overall he is doing well without any new chest pain or worsening shortness of breath.  We did repeat an echocardiogram and unfortunately shows no significant improvement in LVEF and remains at 40 to 45% with pseudonormalization.  There is some inferior akinesis and inferolateral akinesis suggesting of scar.  I suspect his LVEF will not improve any further despite medical therapy due to the scar.  There is moderate left atrial enlargement.  He remains asymptomatic with NYHA class I-II symptoms.  EKG does show inferior Q waves and a sinus bradycardia 54.  Blood pressures well controlled.  His most recent LDL was 74.  11/01/2022  Mr. Cory [name] is seen today in follow-up.  He recently saw Sande Rives, PA-C earlier in April.  At the time he was having some progressive fatigue.  A PET scan was ordered which showed a fixed defect consistent with prior scar but no reversible ischemia.  Myocardial perfusion otherwise was normal.  Echo showed similar findings.  His fatigue is felt to be more likely related to PTSD and poor sleep at night.  He says has improved somewhat.  He was able however to recently build a porch in his house which required a lot of lifting and sawing.  He had no issues with that.  PMHx:  Past Medical History:  Diagnosis Date   Anemia    Arthritis    Coronary  artery disease    a. 09/2001 PCI/BMS to D1: 2.5 x 15 Express 2 BMS;  b. 2011 Cath: stable anatomy; c. 03/2014 Low risk MV, EF 54%;  d. 09/2016 Inf STEMI/PCI [facility]): LM nl, LAD 20m D1 nl w/ 90% in 148minf branch, LCX nl, RCA 99 (Synergy DES x 4 - 3.5x28 prox, 3.5x3815m.5x16d, 2.25x16 to RPDA), EF 55%.   GERD (gastroesophageal reflux disease)    Hyperlipidemia    Hypertensive heart disease    [facility]   Pneumonia    PTSD (post-traumatic stress disorder)     Past Surgical History:  Procedure Laterality Date   BIOPSY  04/10/2020   Procedure: BIOPSY;  Surgeon: RehRogene HoustonD;  [facility];  Service: Endoscopy;;  distal, esophageal   CARDIAC CATHETERIZATION  05/17/2010   left main free of significant lesion; LAD with 60% lesion in mid segment & 60-70%  lesion in mid-distal segment with subtotal occlusion at apex & small side branch of diagonal 1 that is subtotally occluded; ramus intermedius with 90% ostial stenosis; L Cfx w/mild luminal irreg; RCA is dominant vessel with diffuse disease, 50% prox lesion & 60% lesion in mid segment & 60% lesion in PDA   CATARACT EXTRACTION W/PHACO Right 06/07/2015   Procedure: CATARACT EXTRACTION PHACO AND INTRAOCULAR LENS PLACEMENT ([place]);  Surgeon: Marylynn Pearson, MD;  Location: [place];  Service: Ophthalmology;  Laterality: Right;   COLONOSCOPY  02/25/2012   Procedure: COLONOSCOPY;  Surgeon: Jamesetta So, MD;  [facility];  Service: Gastroenterology;  Laterality: N/A;   COLONOSCOPY N/A 04/11/2020   Procedure: COLONOSCOPY;  Surgeon: Rogene Houston, MD;  [facility];  Service: Endoscopy;  Laterality: N/A;   CORONARY ANGIOPLASTY WITH STENT PLACEMENT  09/2001   Taxus (2.5x35m) stent to 1st diagonal    ESOPHAGEAL DILATION N/A 08/14/2016   Procedure: ESOPHAGEAL DILATION;  Surgeon: NRogene Houston MD;  [facility];  Service: Endoscopy;  Laterality: N/A;    ESOPHAGOGASTRODUODENOSCOPY  02/25/2012   Procedure: ESOPHAGOGASTRODUODENOSCOPY (EGD);  Surgeon: MJamesetta So MD;  [facility];  Service: Gastroenterology;  Laterality: N/A;   ESOPHAGOGASTRODUODENOSCOPY N/A 07/28/2014   Procedure: ESOPHAGOGASTRODUODENOSCOPY (EGD);  Surgeon: NRogene Houston MD;  [facility];  Service: Endoscopy;  Laterality: N/A;  200-rescheduled to 261Ann notified pt   ESOPHAGOGASTRODUODENOSCOPY N/A 08/14/2016   Procedure: ESOPHAGOGASTRODUODENOSCOPY (EGD);  Surgeon: NRogene Houston MD;  [facility];  Service: Endoscopy;  Laterality: N/A;  100   ESOPHAGOGASTRODUODENOSCOPY N/A 04/10/2020   Procedure: ESOPHAGOGASTRODUODENOSCOPY (EGD);  Surgeon: RRogene Houston MD;  [facility];  Service: Endoscopy;  Laterality: N/A;   EYE SURGERY Bilateral    lasik surgery    GIVENS CAPSULE STUDY N/A 04/12/2020   Procedure: GIVENS CAPSULE STUDY;  Surgeon: RRogene Houston MD;  [facility];  Service: Endoscopy;  Laterality: N/A;   KNEE ARTHROSCOPY WITH MEDIAL MENISECTOMY Left 02/05/2018   Procedure: LEFT KNEE ARTHROSCOPY WITH PARTIAL MEDIAL MENISECTOMY;  Surgeon: HCarole Civil MD;  [facility]: [facility];  Service: Orthopedics;  Laterality: Left;   KNEE SURGERY     LATERAL EPICONDYLE RELEASE Left 01/21/2014   Procedure: Left Tennis Elbow Release with debridement  tendon repair with reattachment;  Surgeon: SCarole Civil MD;  Location: [facility];  Service: Orthopedics;  Laterality: Left;   penile implant     PROSTATE SURGERY     SHOULDER SURGERY     TONSILLECTOMY     TOTAL KNEE ARTHROPLASTY Left 10/19/2018   Procedure: LEFT TOTAL KNEE ARTHROPLASTY;  Surgeon: LVickey Huger MD;  [facility];  Service: Orthopedics;  Laterality: Left;  with abductor block failed spinal   TRANSTHORACIC ECHOCARDIOGRAM  03/2010   EF=>55%; LV mildly dilated; LA mod dilated; mild MR & TR; normal RVSP; mild pulm valve regurg    FAMHx:  Family History   Problem Relation Age of Onset   Colitis Maternal Aunt     SOCHx:   reports that he has never smoked. He has never used smokeless tobacco. He reports current alcohol use of about 1.0 standard drink of alcohol per week. He reports that he does not use drugs.  ALLERGIES:  Allergies  Allergen Reactions   Ace Inhibitors Other (See Comments)    unknown   Benadryl [Diphenhydramine Hcl] Other (See Comments)    Opposite reaction of medication purpose - hyper  Vioxx [Rofecoxib] Other (See Comments)    headache    ROS: Pertinent items noted in HPI and remainder of comprehensive ROS otherwise negative.  HOME MEDS: Current Outpatient Medications  Medication Sig Dispense Refill   aspirin EC 81 MG tablet Take 81 mg by mouth daily. Swallow whole.     atorvastatin (LIPITOR) 40 MG tablet Take 1 tablet by mouth at bedtime.     buPROPion (WELLBUTRIN SR) 100 MG 12 hr tablet Take 100 mg by mouth daily.     carvedilol (COREG) 3.125 MG tablet Take 1 tablet (3.125 mg total) by mouth 2 (two) times daily. 180 tablet 3   irbesartan (AVAPRO) 300 MG tablet Take 1 tablet (300 mg total) by mouth daily. PATIENT NEED TO SCHEDULE APPOINTMENT FOR FUTURE REFILLS. Take 1 tablet (300 mg total) by mouth daily. 90 tablet 0   loratadine (CLARITIN) 10 MG tablet Take 10 mg by mouth daily.     Magnesium Malate 1250 (141.7 Mg) MG TABS Take 1,250 mg by mouth every evening.      pantoprazole (PROTONIX) 40 MG tablet Take 1 tablet (40 mg total) by mouth 2 (two) times daily before a meal. 180 tablet 1   sertraline (ZOLOFT) 100 MG tablet Take 0.5 tablets by mouth every morning.     traZODone (DESYREL) 100 MG tablet Take 150 mg by mouth at bedtime. 150 mg QHS     VITAMIN D PO Take 1,000 mcg by mouth daily.     acetaminophen (TYLENOL) 325 MG tablet Take 650 mg by mouth daily as needed for moderate pain or headache. (Patient not taking: Reported on 11/01/2022)     atorvastatin (LIPITOR) 80 MG tablet Take 1 tablet (80 mg total) by  mouth daily at 6 PM. (Patient not taking: Reported on 11/01/2022) 90 tablet 3   ferrous sulfate 325 (65 FE) MG tablet Take 1 tablet (325 mg total) by mouth daily with breakfast. (Patient not taking: Reported on 11/01/2022)     hydrOXYzine (ATARAX/VISTARIL) 10 MG tablet Take 10 mg by mouth at bedtime as needed. 1 tab HS PRN, 1-2 tabs PRN for anxiety. (Patient not taking: Reported on 11/01/2022)     Current Facility-Administered Medications  Medication Dose Route Frequency Provider Last Rate Last Admin   triamcinolone acetonide (KENALOG-40) injection 20 mg  20 mg Other Once Roundup, Max T, DPM        LABS/IMAGING: No results found for this or any previous visit (from the past 66 hour(s)).  No results found.  VITALS: BP (!) 126/90 (BP Location: Left Arm, Patient Position: Sitting, Cuff Size: Normal)   Pulse (!) 55   Ht 5' 9.75" (1.772 m)   Wt 188 lb 12.8 oz (85.6 kg)   SpO2 96%   BMI 27.28 kg/m   EXAM: General appearance: alert and no distress Neck: no carotid bruit, no JVD and thyroid not enlarged, symmetric, no tenderness/mass/nodules Lungs: clear to auscultation bilaterally Heart: regular rate and rhythm, S1, S2 normal, no murmur, click, rub or gallop Abdomen: soft, non-tender; bowel sounds normal; no masses,  no organomegaly Extremities: extremities normal, atraumatic, no cyanosis or edema Pulses: 2+ and symmetric Skin: Skin color, texture, turgor normal. No rashes or lesions Neurologic: Grossly normal Psych: Pleasant  EKG: Sinus bradycardia 55-personally reviewed  ASSESSMENT: Inferior STEMI, status post 4 DES to the RCA (2017[place], negative PET scan for ischemia in 04/2022 (LVEF 47%). LVEF 50-55% (03/2022) Coronary artery disease status post Taxus DES to the first diagonal and 2002 PTSD Hypertension Dyslipidemia  PLAN: 1.   Mr. Cory [name] had some worsening fatigue which is felt likely related to PTSD.  He had a repeat echo which showed low normal EF and a PET scan  which showed an EF of about 47% with a fixed defect consistent with prior scar but no reversible ischemia.  Blood pressure is well controlled.  His lipids are at target.  No changes to his meds today.  Plan follow-up with me or APP annually or sooner as necessary.  [name], MD, [facility], [place] Director of the Advanced Lipid Disorders &  Cardiovascular Risk Reduction Clinic Attending Cardiologist  Direct Dial: [phone]  Fax: [phone]  Website:  [email or web address] 11/01/2022, 3:45 PM

## 2022-11-01 NOTE — Patient Instructions (Signed)
Medication Instructions:  NO CHANGES  *If you need a refill on your cardiac medications before your next appointment, please call your pharmacy*   Follow-Up: At [facility], you and your health needs are our priority.  As part of our continuing mission to provide you with exceptional heart care, we have created designated Provider Care Teams.  These Care Teams include your primary Cardiologist (physician) and Advanced Practice Providers (APPs -  Physician Assistants and Nurse Practitioners) who all work together to provide you with the care you need, when you need it.  We recommend signing up for the patient portal called "MyChart".  Sign up information is provided on this After Visit Summary.  MyChart is used to connect with patients for Virtual Visits (Telemedicine).  Patients are able to view lab/test results, encounter notes, upcoming appointments, etc.  Non-urgent messages can be sent to your provider as well.   To learn more about what you can do with MyChart, go to [email or web address].    Your next appointment:    12 months with Dr. Hilty  

## 2022-12-02 DIAGNOSIS — E538 Deficiency of other specified B group vitamins: Secondary | ICD-10-CM | POA: Diagnosis not present

## 2023-01-06 DIAGNOSIS — E538 Deficiency of other specified B group vitamins: Secondary | ICD-10-CM | POA: Diagnosis not present

## 2023-01-09 DIAGNOSIS — J22 Unspecified acute lower respiratory infection: Secondary | ICD-10-CM | POA: Diagnosis not present

## 2023-01-09 DIAGNOSIS — Z6828 Body mass index (BMI) 28.0-28.9, adult: Secondary | ICD-10-CM | POA: Diagnosis not present

## 2023-01-09 DIAGNOSIS — E663 Overweight: Secondary | ICD-10-CM | POA: Diagnosis not present

## 2023-01-27 ENCOUNTER — Ambulatory Visit: Payer: PPO | Admitting: Podiatry

## 2023-01-27 ENCOUNTER — Encounter: Payer: Self-pay | Admitting: Podiatry

## 2023-01-27 DIAGNOSIS — L600 Ingrowing nail: Secondary | ICD-10-CM | POA: Diagnosis not present

## 2023-01-27 MED ORDER — NEOMYCIN-POLYMYXIN-HC 1 % OT SOLN: OTIC | 1 refills | Status: DC

## 2023-01-27 NOTE — Progress Notes (Signed)
He presents today for follow-up.  States that his medial border of the hallux has been hurting for the past 2 weeks and has been exquisitely painful.  Objective: Vital signs are stable alert and oriented x 3.  There is no erythema edema cellulitis drainage or odor to the remainder of the nails however the nail hallux left tibial border exquisitely tender on palpation and does demonstrate a small abscess distally.  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  Plan: Currently performed matrixectomy after local anesthetic was injected.  He tolerated this procedure well he was given both oral and written home-going instruction for care and soaking of the toe as well as a prescription for Cortisporin Otic to be applied twice daily after soaking follow-up with him in a couple of weeks.

## 2023-01-27 NOTE — Patient Instructions (Signed)

## 2023-02-04 DIAGNOSIS — E538 Deficiency of other specified B group vitamins: Secondary | ICD-10-CM | POA: Diagnosis not present

## 2023-02-11 ENCOUNTER — Ambulatory Visit: Payer: PPO | Admitting: Podiatry

## 2023-02-16 DIAGNOSIS — Z6826 Body mass index (BMI) 26.0-26.9, adult: Secondary | ICD-10-CM | POA: Diagnosis not present

## 2023-02-16 DIAGNOSIS — M545 Low back pain, unspecified: Secondary | ICD-10-CM | POA: Diagnosis not present

## 2023-02-16 DIAGNOSIS — E663 Overweight: Secondary | ICD-10-CM | POA: Diagnosis not present

## 2023-02-18 ENCOUNTER — Other Ambulatory Visit: Payer: PPO

## 2023-03-04 ENCOUNTER — Ambulatory Visit: Payer: PPO

## 2023-03-04 NOTE — Progress Notes (Signed)
Patient is in office today for nail check from ingrown procedure done by Dr. Milinda Pointer He stated that he is doing well no concerns noted. No signs of infection noted, no pain or redness noted.

## 2023-03-07 DIAGNOSIS — E538 Deficiency of other specified B group vitamins: Secondary | ICD-10-CM | POA: Diagnosis not present

## 2023-05-02 DIAGNOSIS — E538 Deficiency of other specified B group vitamins: Secondary | ICD-10-CM | POA: Diagnosis not present

## 2023-05-12 DIAGNOSIS — L309 Dermatitis, unspecified: Secondary | ICD-10-CM | POA: Diagnosis not present

## 2023-05-12 DIAGNOSIS — Z1283 Encounter for screening for malignant neoplasm of skin: Secondary | ICD-10-CM | POA: Diagnosis not present

## 2023-05-12 DIAGNOSIS — L570 Actinic keratosis: Secondary | ICD-10-CM | POA: Diagnosis not present

## 2023-05-12 DIAGNOSIS — D485 Neoplasm of uncertain behavior of skin: Secondary | ICD-10-CM | POA: Diagnosis not present

## 2023-06-09 DIAGNOSIS — E538 Deficiency of other specified B group vitamins: Secondary | ICD-10-CM | POA: Diagnosis not present

## 2023-07-02 DIAGNOSIS — E538 Deficiency of other specified B group vitamins: Secondary | ICD-10-CM | POA: Diagnosis not present

## 2023-08-12 DIAGNOSIS — E538 Deficiency of other specified B group vitamins: Secondary | ICD-10-CM | POA: Diagnosis not present

## 2023-09-11 DIAGNOSIS — I10 Essential (primary) hypertension: Secondary | ICD-10-CM | POA: Diagnosis not present

## 2023-09-11 DIAGNOSIS — D508 Other iron deficiency anemias: Secondary | ICD-10-CM | POA: Diagnosis not present

## 2023-09-11 DIAGNOSIS — Z23 Encounter for immunization: Secondary | ICD-10-CM | POA: Diagnosis not present

## 2023-09-11 DIAGNOSIS — Z0001 Encounter for general adult medical examination with abnormal findings: Secondary | ICD-10-CM | POA: Diagnosis not present

## 2023-09-11 DIAGNOSIS — E782 Mixed hyperlipidemia: Secondary | ICD-10-CM | POA: Diagnosis not present

## 2023-09-11 DIAGNOSIS — Z1331 Encounter for screening for depression: Secondary | ICD-10-CM | POA: Diagnosis not present

## 2023-09-11 DIAGNOSIS — F4312 Post-traumatic stress disorder, chronic: Secondary | ICD-10-CM | POA: Diagnosis not present

## 2023-09-11 DIAGNOSIS — I2510 Atherosclerotic heart disease of native coronary artery without angina pectoris: Secondary | ICD-10-CM | POA: Diagnosis not present

## 2023-09-11 DIAGNOSIS — Z6826 Body mass index (BMI) 26.0-26.9, adult: Secondary | ICD-10-CM | POA: Diagnosis not present

## 2023-09-11 DIAGNOSIS — E538 Deficiency of other specified B group vitamins: Secondary | ICD-10-CM | POA: Diagnosis not present

## 2023-10-08 DIAGNOSIS — E538 Deficiency of other specified B group vitamins: Secondary | ICD-10-CM | POA: Diagnosis not present

## 2023-12-01 DIAGNOSIS — E538 Deficiency of other specified B group vitamins: Secondary | ICD-10-CM | POA: Diagnosis not present

## 2024-01-19 DIAGNOSIS — E538 Deficiency of other specified B group vitamins: Secondary | ICD-10-CM | POA: Diagnosis not present

## 2024-01-21 ENCOUNTER — Ambulatory Visit (HOSPITAL_COMMUNITY)
Admission: RE | Admit: 2024-01-21 | Discharge: 2024-01-21 | Disposition: A | Discharge status: Home / Self Care | Payer: PPO | Source: Ambulatory Visit | Attending: Family Medicine | Admitting: Family Medicine

## 2024-01-21 ENCOUNTER — Other Ambulatory Visit (HOSPITAL_COMMUNITY): Payer: Self-pay | Admitting: Family Medicine

## 2024-01-21 DIAGNOSIS — J22 Unspecified acute lower respiratory infection: Secondary | ICD-10-CM | POA: Diagnosis not present

## 2024-01-21 DIAGNOSIS — E663 Overweight: Secondary | ICD-10-CM | POA: Diagnosis not present

## 2024-01-21 DIAGNOSIS — Z6828 Body mass index (BMI) 28.0-28.9, adult: Secondary | ICD-10-CM | POA: Diagnosis not present

## 2024-02-06 DIAGNOSIS — J22 Unspecified acute lower respiratory infection: Secondary | ICD-10-CM | POA: Diagnosis not present

## 2024-02-06 DIAGNOSIS — Z6827 Body mass index (BMI) 27.0-27.9, adult: Secondary | ICD-10-CM | POA: Diagnosis not present

## 2024-02-06 DIAGNOSIS — E663 Overweight: Secondary | ICD-10-CM | POA: Diagnosis not present

## 2024-03-08 DIAGNOSIS — E538 Deficiency of other specified B group vitamins: Secondary | ICD-10-CM | POA: Diagnosis not present

## 2024-03-09 DIAGNOSIS — L111 Transient acantholytic dermatosis [Grover]: Secondary | ICD-10-CM | POA: Diagnosis not present

## 2024-03-09 DIAGNOSIS — L570 Actinic keratosis: Secondary | ICD-10-CM | POA: Diagnosis not present

## 2024-03-09 DIAGNOSIS — L821 Other seborrheic keratosis: Secondary | ICD-10-CM | POA: Diagnosis not present

## 2024-03-16 DIAGNOSIS — E291 Testicular hypofunction: Secondary | ICD-10-CM | POA: Diagnosis not present

## 2024-03-16 DIAGNOSIS — E538 Deficiency of other specified B group vitamins: Secondary | ICD-10-CM | POA: Diagnosis not present

## 2024-03-16 DIAGNOSIS — D509 Iron deficiency anemia, unspecified: Secondary | ICD-10-CM | POA: Diagnosis not present

## 2024-03-16 DIAGNOSIS — R5383 Other fatigue: Secondary | ICD-10-CM | POA: Diagnosis not present

## 2024-03-16 DIAGNOSIS — Z6827 Body mass index (BMI) 27.0-27.9, adult: Secondary | ICD-10-CM | POA: Diagnosis not present

## 2024-03-16 DIAGNOSIS — I10 Essential (primary) hypertension: Secondary | ICD-10-CM | POA: Diagnosis not present

## 2024-03-16 DIAGNOSIS — Z9229 Personal history of other drug therapy: Secondary | ICD-10-CM | POA: Diagnosis not present

## 2024-03-16 DIAGNOSIS — R7309 Other abnormal glucose: Secondary | ICD-10-CM | POA: Diagnosis not present

## 2024-05-07 DIAGNOSIS — E538 Deficiency of other specified B group vitamins: Secondary | ICD-10-CM | POA: Diagnosis not present

## 2024-05-11 DIAGNOSIS — L309 Dermatitis, unspecified: Secondary | ICD-10-CM | POA: Diagnosis not present

## 2024-05-11 DIAGNOSIS — L570 Actinic keratosis: Secondary | ICD-10-CM | POA: Diagnosis not present

## 2024-05-11 DIAGNOSIS — L111 Transient acantholytic dermatosis [Grover]: Secondary | ICD-10-CM | POA: Diagnosis not present

## 2024-06-15 DIAGNOSIS — E538 Deficiency of other specified B group vitamins: Secondary | ICD-10-CM | POA: Diagnosis not present

## 2024-06-28 DIAGNOSIS — Z6825 Body mass index (BMI) 25.0-25.9, adult: Secondary | ICD-10-CM | POA: Diagnosis not present

## 2024-06-28 DIAGNOSIS — R5383 Other fatigue: Secondary | ICD-10-CM | POA: Diagnosis not present

## 2024-07-22 DIAGNOSIS — M159 Polyosteoarthritis, unspecified: Secondary | ICD-10-CM | POA: Diagnosis not present

## 2024-08-11 DIAGNOSIS — E538 Deficiency of other specified B group vitamins: Secondary | ICD-10-CM | POA: Diagnosis not present

## 2024-08-25 DIAGNOSIS — R5383 Other fatigue: Secondary | ICD-10-CM | POA: Diagnosis not present

## 2024-08-25 DIAGNOSIS — E663 Overweight: Secondary | ICD-10-CM | POA: Diagnosis not present

## 2024-08-25 DIAGNOSIS — Z6826 Body mass index (BMI) 26.0-26.9, adult: Secondary | ICD-10-CM | POA: Diagnosis not present

## 2024-08-25 DIAGNOSIS — E538 Deficiency of other specified B group vitamins: Secondary | ICD-10-CM | POA: Diagnosis not present

## 2024-09-07 ENCOUNTER — Ambulatory Visit: Attending: Internal Medicine | Admitting: Internal Medicine

## 2024-09-07 VITALS — BP 142/82 | HR 63 | Ht 69.75 in | Wt 184.0 lb

## 2024-09-07 DIAGNOSIS — I2089 Other forms of angina pectoris: Secondary | ICD-10-CM | POA: Diagnosis not present

## 2024-09-07 DIAGNOSIS — D509 Iron deficiency anemia, unspecified: Secondary | ICD-10-CM | POA: Diagnosis not present

## 2024-09-07 DIAGNOSIS — I255 Ischemic cardiomyopathy: Secondary | ICD-10-CM

## 2024-09-07 DIAGNOSIS — R5383 Other fatigue: Secondary | ICD-10-CM | POA: Diagnosis not present

## 2024-09-07 DIAGNOSIS — I10 Essential (primary) hypertension: Secondary | ICD-10-CM | POA: Diagnosis not present

## 2024-09-07 DIAGNOSIS — R634 Abnormal weight loss: Secondary | ICD-10-CM | POA: Diagnosis not present

## 2024-09-07 DIAGNOSIS — Z125 Encounter for screening for malignant neoplasm of prostate: Secondary | ICD-10-CM | POA: Diagnosis not present

## 2024-09-07 NOTE — Patient Instructions (Addendum)
 Medication Instructions:  Your physician recommends that you continue on your current medications as directed. Please refer to the Current Medication list given to you today.  *If you need a refill on your cardiac medications before your next appointment, please call your pharmacy*  Lab Work: CBC, CMET, Lipid Profile, TSH, B12 and PSA If you have labs (blood work) drawn today and your tests are completely normal, you will receive your results only by: MyChart Message (if you have MyChart) OR A paper copy in the mail If you have any lab test that is abnormal or we need to change your treatment, we will call you to review the results.  Testing/Procedures: None ordered.   Follow-Up: At [facility], you and your health needs are our priority.  As part of our continuing mission to provide you with exceptional heart care, our providers are all part of one team.  This team includes your primary Cardiologist (physician) and Advanced Practice Providers or APPs (Physician Assistants and Nurse Practitioners) who all work together to provide you with the care you need, when you need it.  Your next appointment:   12 months with Dr Mona

## 2024-09-07 NOTE — Progress Notes (Signed)
 OFFICE NOTE  Chief Complaint:  Routine follow-up  Primary Care Physician: Marvine Rush, MD  HPI:  Cory [name] is a 78 year old gentleman, who I have been following for coronary artery disease, status post Taxus stenting in 2002. He also has had some ongoing chronic angina controlled on Imdur  60 mg. He also has PTSD, on a number of medications, but that has been stable, as well as hypertension. Today he has a few different complaints, including some increasing urinary frequency. He does have a history of BPH and is followed at the [facility] for this. Sounds like he may have had a TURP procedure recently. He is not on any medications for his prostate. In addition he is describing fatigue. This is a new symptom for him and reports he is sleeping much more regularly and often than he did in the past. He's not exercising as much as he had been before. Of note he is on 3 different antidepressant medications for his PTSD, again managed by the [facility].  He reports fairly stable exertional angina however at times he does get more short of breath with exercise. His last stress test was in 2011.  Cory [name] returns today for follow-up. He is without complaint. His blood pressure appears to be well-controlled. He denies any chest pain and is actually had very little need to take any short acting nitroglycerin since he's been on Imdur . He remains active and has no shortness of breath.  01/08/2017  Cory [name] returns today for follow-up. He was seen in October by Lonni Meager, NP, for follow-up of an ST elevation MI. He was vacationing in the [facility] area and presented to [facility] hospital after having ST elevation. He underwent emergent Catheterization and was found to have subtotal occlusion of the right coronary artery. He received 4 drug-eluting stents and was discharged on Effient , aspirin , atenolol and simvastatin . Subsequent to that he is done fairly well without any recurrent chest pain.  He does get a little mild dyspnea with exertion. He did not participate in cardiac rehabilitation. He was noted to be markedly bradycardic and follow-up and his atenolol was discontinued. His simvastatin  was discontinued in favor of a highly active statin, namely Lipitor  80 mg daily. A repeat lipid profile was performed on 12/04/2016 showing total cluster 105, triglycerides 63, HDL-C 49, and LDL-C 43. Is is excellent control and I would continue with his current dose. Also of note, his blood pressure is elevated today. He says that it has remained elevated since discontinuing his atenolol.  01/24/2017  Cory [name] returns for follow-up of his echo. Recently that demonstrated an EF of 45% which is reduced from his prior studies. This is related to his recent STEMI. Hopefully he'll have some recovery of his LV function in time. I did switch him from losartan 100 up to valsartan  320 since his blood pressure was not at goal. Now his blood pressure is much improved at 130/82. He feels really well other than he has some pain behind his left knee. He reports to stiffness and difficulty in extending it. He does not recall injuring it. He wondered if this is related to his cholesterol medicine. Of note he is been on a statin medication now for several months.  08/07/2017  Cory [name] was seen today in follow-up. Over the last few months she's had no new complaints. He denies any chest pain or worsening shortness of breath. He was seen about his knee pain and told he has arthritis.  His EF was 45% on echo and recently was subjected to a valsartan  and recall. He was switched over to irbesartan  and seems to be tolerating that well. Blood pressure is reasonably well controlled. He should be on dual antiplatelet therapy and at least until October 2018.  11/18/2017  Cory [name] returns today for follow-up.  Overall is without complaints.  EKG shows sinus rhythm at 60.  He is now 1 year since his ST elevation MI.  We had  discussed possibly discontinuing his Effient .  He denies any further chest pain.  He remains physically active.  05/21/2018  Cory [name] was seen today in follow-up.  He seems to be doing really well.  He denies any chest pain or worsening shortness of breath.  He had a repeat echo that unfortunately does not show any improvement in LVEF which is still 45% with inferior hypokinesis.  The LVEF however has not worsened.  He is on irbesartan , but not is not on a beta-blocker.  He takes aspirin  monotherapy.  He had a lipid profile drawn this morning however the results are pending.   07/25/2020  Cory [name] is seen today in follow-up.  Overall he is doing well without any new chest pain or worsening shortness of breath.  We did repeat an echocardiogram and unfortunately shows no significant improvement in LVEF and remains at 40 to 45% with pseudonormalization.  There is some inferior akinesis and inferolateral akinesis suggesting of scar.  I suspect his LVEF will not improve any further despite medical therapy due to the scar.  There is moderate left atrial enlargement.  He remains asymptomatic with NYHA class I-II symptoms.  EKG does show inferior Q waves and a sinus bradycardia 54.  Blood pressures well controlled.  His most recent LDL was 74.  11/01/2022  Cory [name] is seen today in follow-up.  He recently saw Aline Door, PA-C earlier in April.  At the time he was having some progressive fatigue.  A PET scan was ordered which showed a fixed defect consistent with prior scar but no reversible ischemia.  Myocardial perfusion otherwise was normal.  Echo showed similar findings.  His fatigue is felt to be more likely related to PTSD and poor sleep at night.  He says has improved somewhat.  He was able however to recently build a porch in his house which required a lot of lifting and sawing.  He had no issues with that.  09/07/2024  Cory [name] is seen today in follow-up.  Seems to have had some progressive  fatigue and disinterest in activity.  He says he has little motivation and no energy.  He has had workup before for anemia and remains on chronic iron therapy.  He denies any chest pain.  He said no worsening shortness of breath, orthopnea, edema or other heart failure symptoms.  His last LVEF was around 50% with scar noted on a PET scan in 2023.  This did not show any reversible ischemia.  He saw his PCP recently who recommended a neurology referral but he has not yet had that appointment.  Blood pressure is a little elevated today.  Has not had any repeat labs in about a year.  He has had some recent weight loss although has cut out some dietary sources of sugar such as sodas.  He reports a decreased appetite.  He does have a history of PTSD and depression but is on therapy with bupropion  in sertraline.  He reports good sleep at night.  He denies any apneic symptoms or heavy snoring.  PMHx:  Past Medical History:  Diagnosis Date   Anemia    Arthritis    Coronary artery disease    a. 09/2001 PCI/BMS to D1: 2.5 x 15 Express 2 BMS;  b. 2011 Cath: stable anatomy; c. 03/2014 Low risk MV, EF 54%;  d. 09/2016 Inf STEMI/PCI [facility]): LM nl, LAD 79m, D1 nl w/ 90% in 1mm inf branch, LCX nl, RCA 99 (Synergy DES x 4 - 3.5x28 prox, 3.5x14m, 2.5x16d, 2.25x16 to RPDA), EF 55%.   GERD (gastroesophageal reflux disease)    Hyperlipidemia    Hypertensive heart disease    Myocardial infarction [facility]) 2017   Pneumonia    PTSD (post-traumatic stress disorder)     Past Surgical History:  Procedure Laterality Date   BIOPSY  04/10/2020   Procedure: BIOPSY;  Surgeon: Golda Claudis PENNER, MD;  [facility];  Service: Endoscopy;;  distal, esophageal   CARDIAC CATHETERIZATION  05/17/2010   left main free of significant lesion; LAD with 60% lesion in mid segment & 60-70% lesion in mid-distal segment with subtotal occlusion at apex & small side branch of diagonal 1 that is subtotally occluded; ramus  intermedius with 90% ostial stenosis; L Cfx w/mild luminal irreg; RCA is dominant vessel with diffuse disease, 50% prox lesion & 60% lesion in mid segment & 60% lesion in PDA   CATARACT EXTRACTION W/PHACO Right 06/07/2015   Procedure: CATARACT EXTRACTION PHACO AND INTRAOCULAR LENS PLACEMENT (IOC);  Surgeon: Gaither Quan, MD;  Location: [facility] OR;  Service: Ophthalmology;  Laterality: Right;   COLONOSCOPY  02/25/2012   Procedure: COLONOSCOPY;  Surgeon: Oneil DELENA Budge, MD;  [facility];  Service: Gastroenterology;  Laterality: N/A;   COLONOSCOPY N/A 04/11/2020   Procedure: COLONOSCOPY;  Surgeon: Golda Claudis PENNER, MD;  [facility];  Service: Endoscopy;  Laterality: N/A;   CORONARY ANGIOPLASTY WITH STENT PLACEMENT  09/2001   Taxus (2.5x67mm) stent to 1st diagonal    ESOPHAGEAL DILATION N/A 08/14/2016   Procedure: ESOPHAGEAL DILATION;  Surgeon: Claudis PENNER Golda, MD;  [facility];  Service: Endoscopy;  Laterality: N/A;   ESOPHAGOGASTRODUODENOSCOPY  02/25/2012   Procedure: ESOPHAGOGASTRODUODENOSCOPY (EGD);  Surgeon: Oneil DELENA Budge, MD;  [facility];  Service: Gastroenterology;  Laterality: N/A;   ESOPHAGOGASTRODUODENOSCOPY N/A 07/28/2014   Procedure: ESOPHAGOGASTRODUODENOSCOPY (EGD);  Surgeon: Claudis PENNER Golda, MD;  [facility];  Service: Endoscopy;  Laterality: N/A;  200-rescheduled to 245 Ann notified pt   ESOPHAGOGASTRODUODENOSCOPY N/A 08/14/2016   Procedure: ESOPHAGOGASTRODUODENOSCOPY (EGD);  Surgeon: Claudis PENNER Golda, MD;  [facility];  Service: Endoscopy;  Laterality: N/A;  100   ESOPHAGOGASTRODUODENOSCOPY N/A 04/10/2020   Procedure: ESOPHAGOGASTRODUODENOSCOPY (EGD);  Surgeon: Golda Claudis PENNER, MD;  [facility];  Service: Endoscopy;  Laterality: N/A;   EYE SURGERY Bilateral    lasik surgery    GIVENS CAPSULE STUDY N/A 04/12/2020   Procedure: GIVENS CAPSULE STUDY;  Surgeon: Golda Claudis PENNER, MD;  [facility];  Service:  Endoscopy;  Laterality: N/A;   KNEE ARTHROSCOPY WITH MEDIAL MENISECTOMY Left 02/05/2018   Procedure: LEFT KNEE ARTHROSCOPY WITH PARTIAL MEDIAL MENISECTOMY;  Surgeon: Margrette Taft BRAVO, MD;  Location: [facility];  Service: Orthopedics;  Laterality: Left;   KNEE SURGERY     LATERAL EPICONDYLE RELEASE Left 01/21/2014   Procedure: Left Tennis Elbow Release with debridement  tendon repair with reattachment;  Surgeon: Taft BRAVO Margrette, MD;  Location: [facility];  Service: Orthopedics;  Laterality: Left;   penile implant     PROSTATE SURGERY     SHOULDER SURGERY     TONSILLECTOMY     TOTAL KNEE ARTHROPLASTY Left 10/19/2018   Procedure: LEFT TOTAL KNEE ARTHROPLASTY;  Surgeon: Rubie Kemps, MD;  Location: [facility];  Service: Orthopedics;  Laterality: Left;  with abductor block failed spinal   TRANSTHORACIC ECHOCARDIOGRAM  03/2010   EF=>55%; LV mildly dilated; LA mod dilated; mild MR & TR; normal RVSP; mild pulm valve regurg    FAMHx:  Family History  Problem Relation Age of Onset   Colitis Maternal Aunt     SOCHx:   reports that he has never smoked. He has never used smokeless tobacco. He reports current alcohol use of about 1.0 standard drink of alcohol per week. He reports that he does not use drugs.  ALLERGIES:  Allergies  Allergen Reactions   Ace Inhibitors Other (See Comments)    unknown   Benadryl [Diphenhydramine Hcl] Other (See Comments)    Opposite reaction of medication purpose - hyper   Vioxx [Rofecoxib] Other (See Comments)    headache    ROS: Pertinent items noted in HPI and remainder of comprehensive ROS otherwise negative.  HOME MEDS: Current Outpatient Medications  Medication Sig Dispense Refill   acetaminophen  (TYLENOL ) 325 MG tablet Take 650 mg by mouth daily as needed for moderate pain or headache.     aspirin  EC 81 MG tablet Take 81 mg by mouth daily. Swallow whole.     atorvastatin  (LIPITOR ) 40 MG tablet Take 1 tablet by mouth at bedtime.     buPROPion  (WELLBUTRIN  SR)  100 MG 12 hr tablet Take 100 mg by mouth daily.     carvedilol  (COREG ) 3.125 MG tablet Take 1 tablet (3.125 mg total) by mouth 2 (two) times daily. 180 tablet 3   irbesartan  (AVAPRO ) 300 MG tablet Take 1 tablet (300 mg total) by mouth daily. PATIENT NEED TO SCHEDULE APPOINTMENT FOR FUTURE REFILLS. Take 1 tablet (300 mg total) by mouth daily. 90 tablet 0   loratadine  (CLARITIN ) 10 MG tablet Take 10 mg by mouth daily.     Magnesium Malate 1250 (141.7 Mg) MG TABS Take 1,250 mg by mouth every evening.      pantoprazole  (PROTONIX ) 40 MG tablet Take 1 tablet (40 mg total) by mouth 2 (two) times daily before a meal. 180 tablet 1   sertraline (ZOLOFT) 100 MG tablet Take 0.5 tablets by mouth every morning.     traZODone  (DESYREL ) 100 MG tablet Take 150 mg by mouth at bedtime. 150 mg QHS     VITAMIN D PO Take 1,000 mcg by mouth daily.     ferrous sulfate  325 (65 FE) MG tablet Take 1 tablet (325 mg total) by mouth daily with breakfast. (Patient not taking: Reported on 11/01/2022)     hydrOXYzine  (ATARAX /VISTARIL ) 10 MG tablet Take 10 mg by mouth at bedtime as needed. 1 tab HS PRN, 1-2 tabs PRN for anxiety. (Patient not taking: Reported on 09/07/2024)     NEOMYCIN -POLYMYXIN-HYDROCORTISONE (CORTISPORIN) 1 % SOLN OTIC solution Apply 1-2 drops to toe BID after soaking (Patient not taking: Reported on 09/07/2024) 10 mL 1   Current Facility-Administered Medications  Medication Dose Route Frequency Provider Last Rate Last Admin   triamcinolone  acetonide (KENALOG -40) injection 20 mg  20 mg Other Once Skyline, Max T, DPM        LABS/IMAGING: No results found for this or any previous visit (  from the past 48 hours).  No results found.  VITALS: BP (!) 142/82 (BP Location: Right Arm, Patient Position: Sitting, Cuff Size: Normal)   Pulse 63   Ht 5' 9.75 (1.772 m)   Wt 184 lb (83.5 kg)   SpO2 96%   BMI 26.59 kg/m   EXAM: General appearance: alert and no distress Neck: no carotid bruit, no JVD and thyroid  not  enlarged, symmetric, no tenderness/mass/nodules Lungs: clear to auscultation bilaterally Heart: regular rate and rhythm, S1, S2 normal, no murmur, click, rub or gallop Abdomen: soft, non-tender; bowel sounds normal; no masses,  no organomegaly Extremities: extremities normal, atraumatic, no cyanosis or edema Pulses: 2+ and symmetric Skin: Skin color, texture, turgor normal. No rashes or lesions Neurologic: Grossly normal Psych: Pleasant  EKG: EKG Interpretation Date/Time:  Tuesday September 07 2024 09:58:22 EDT Ventricular Rate:  63 PR Interval:  222 QRS Duration:  98 QT Interval:  422 QTC Calculation: 431 R Axis:   -16  Text Interpretation: Sinus rhythm with 1st degree A-V block Cannot rule out Inferior infarct , age undetermined When compared with ECG of 17-May-2010 09:13, PR interval has increased Minimal criteria for Inferior infarct are now Present Nonspecific T wave abnormality now evident in Inferior leads T wave amplitude has decreased in Lateral leads Confirmed by Mona Kent [phone]) on 09/07/2024 10:14:20 AM   ASSESSMENT: Inferior STEMI, status post 4 DES to the RCA (2017[place], negative PET scan for ischemia in 04/2022 (LVEF 47%). LVEF 50-55% (03/2022) Coronary artery disease status post Taxus DES to the first diagonal and 2002 PTSD Hypertension Dyslipidemia Fatigue  PLAN: 1.   Cory [name] continues to have persistent fatigue and recently some weight loss.  He said it was similar to last year but somewhat worse.  Previous workup included a PET scan which was negative for ischemia and stable LVEF around 50% with scar in the inferior wall.  He has had no anginal or heart failure symptoms.  I think his fatigue could be multifactorial or possibly related to depression or PTSD or perhaps another metabolic condition.  He does have a history of anemia but has been on iron.  He remotely had a B12 level which was normal.  He had a TSH several years ago which was okay.  I would  like to repeat some labs today including a lipid profile to further investigate his fatigue but I would encourage him to follow-up with his primary care provider and see neurology once that referral has gone through.  If they are not able to get a referral I would be happy to refer him.  Follow-up here annually or sooner as necessary.  Kent KYM Mona, MD, [facility], FNLA, FACP  [place]  [facility]  Medical Director of the [facility]  [facility] Diplomate of the American Board of Clinical Lipidology Attending Cardiologist  Direct Dial: [phone]  Fax: [phone]  Website:  [email or web address]   Kent BROCKS Johniya Durfee 09/07/2024, 10:14 AM

## 2024-09-08 DIAGNOSIS — I10 Essential (primary) hypertension: Secondary | ICD-10-CM | POA: Diagnosis not present

## 2024-09-08 DIAGNOSIS — I2089 Other forms of angina pectoris: Secondary | ICD-10-CM | POA: Diagnosis not present

## 2024-09-08 DIAGNOSIS — R634 Abnormal weight loss: Secondary | ICD-10-CM | POA: Diagnosis not present

## 2024-09-08 DIAGNOSIS — E538 Deficiency of other specified B group vitamins: Secondary | ICD-10-CM | POA: Diagnosis not present

## 2024-09-08 DIAGNOSIS — D509 Iron deficiency anemia, unspecified: Secondary | ICD-10-CM | POA: Diagnosis not present

## 2024-09-08 DIAGNOSIS — R5383 Other fatigue: Secondary | ICD-10-CM | POA: Diagnosis not present

## 2024-09-08 DIAGNOSIS — Z125 Encounter for screening for malignant neoplasm of prostate: Secondary | ICD-10-CM | POA: Diagnosis not present

## 2024-09-08 LAB — LIPID PANEL

## 2024-09-09 LAB — CBC
Hematocrit: 41.4 % (ref 37.5–51.0)
Hemoglobin: 14.1 g/dL (ref 13.0–17.7)
MCH: 32.2 pg (ref 26.6–33.0)
MCHC: 34.1 g/dL (ref 31.5–35.7)
MCV: 95 fL (ref 79–97)
Platelets: 208 x10E3/uL (ref 150–450)
RBC: 4.38 x10E6/uL (ref 4.14–5.80)
RDW: 12.3 % (ref 11.6–15.4)
WBC: 6 x10E3/uL (ref 3.4–10.8)

## 2024-09-09 LAB — COMPREHENSIVE METABOLIC PANEL WITH GFR
ALT: 13 IU/L (ref 0–44)
AST: 16 IU/L (ref 0–40)
Albumin: 4.4 g/dL (ref 3.8–4.8)
Alkaline Phosphatase: 80 IU/L (ref 44–121)
BUN/Creatinine Ratio: 15 (ref 10–24)
BUN: 14 mg/dL (ref 8–27)
Bilirubin Total: 0.6 mg/dL (ref 0.0–1.2)
CO2: 21 mmol/L (ref 20–29)
Calcium: 9.4 mg/dL (ref 8.6–10.2)
Chloride: 106 mmol/L (ref 96–106)
Creatinine, Ser: 0.96 mg/dL (ref 0.76–1.27)
Globulin, Total: 1.8 g/dL (ref 1.5–4.5)
Glucose: 112 mg/dL — AB (ref 70–99)
Potassium: 5 mmol/L (ref 3.5–5.2)
Sodium: 141 mmol/L (ref 134–144)
Total Protein: 6.2 g/dL (ref 6.0–8.5)
eGFR: 82 mL/min/1.73 (ref >59)

## 2024-09-09 LAB — VITAMIN B12: Vitamin B-12: 565 pg/mL (ref 232–1245)

## 2024-09-09 LAB — LIPID PANEL
Cholesterol, Total: 133 mg/dL (ref 100–199)
HDL: 37 mg/dL — AB (ref >39)
LDL CALC COMMENT:: 3.6 ratio (ref 0.0–5.0)
LDL Chol Calc (NIH): 71 mg/dL (ref 0–99)
Triglycerides: 142 mg/dL (ref 0–149)
VLDL Cholesterol Cal: 25 mg/dL (ref 5–40)

## 2024-09-09 LAB — TSH: TSH: 1.9 u[IU]/mL (ref 0.450–4.500)

## 2024-09-09 LAB — PSA: Prostate Specific Ag, Serum: 0.4 ng/mL (ref 0.0–4.0)

## 2024-09-18 ENCOUNTER — Ambulatory Visit: Payer: Self-pay | Admitting: Internal Medicine

## 2024-09-29 DIAGNOSIS — M25511 Pain in right shoulder: Secondary | ICD-10-CM | POA: Diagnosis not present

## 2024-10-05 ENCOUNTER — Ambulatory Visit: Admitting: Student

## 2024-12-17 ENCOUNTER — Ambulatory Visit

## 2024-12-17 VITALS — BP 139/88 | HR 71 | Temp 98.6°F | Ht 69.75 in | Wt 169.2 lb

## 2024-12-17 DIAGNOSIS — G3184 Mild cognitive impairment, so stated: Secondary | ICD-10-CM

## 2024-12-17 DIAGNOSIS — I10 Essential (primary) hypertension: Secondary | ICD-10-CM | POA: Diagnosis not present

## 2024-12-17 DIAGNOSIS — F4310 Post-traumatic stress disorder, unspecified: Secondary | ICD-10-CM | POA: Diagnosis not present

## 2024-12-17 DIAGNOSIS — R5383 Other fatigue: Secondary | ICD-10-CM | POA: Diagnosis not present

## 2024-12-17 DIAGNOSIS — R2689 Other abnormalities of gait and mobility: Secondary | ICD-10-CM

## 2024-12-17 DIAGNOSIS — E782 Mixed hyperlipidemia: Secondary | ICD-10-CM

## 2024-12-17 DIAGNOSIS — Z7689 Persons encountering health services in other specified circumstances: Secondary | ICD-10-CM

## 2024-12-17 MED ORDER — ASPIRIN EC 81 MG PO TBEC: 81.0000 mg | DELAYED_RELEASE_TABLET | Freq: Every day | ORAL | 1 refills | Status: AC

## 2024-12-17 MED ORDER — LORATADINE 10 MG PO TABS: 10.0000 mg | ORAL_TABLET | Freq: Every day | ORAL | 1 refills | Status: DC

## 2024-12-17 MED ORDER — BUPROPION HCL ER (SR) 100 MG PO TB12: 100.0000 mg | ORAL_TABLET | Freq: Every day | ORAL | 2 refills | Status: AC

## 2024-12-17 MED ORDER — ATORVASTATIN CALCIUM 40 MG PO TABS: 40.0000 mg | ORAL_TABLET | Freq: Every day | ORAL | 1 refills | Status: AC

## 2024-12-17 MED ORDER — SERTRALINE HCL 100 MG PO TABS: 100.0000 mg | ORAL_TABLET | Freq: Every day | ORAL | 1 refills | Status: DC

## 2024-12-17 MED ORDER — TRAZODONE HCL 100 MG PO TABS: 150.0000 mg | ORAL_TABLET | Freq: Every day | ORAL | 2 refills | Status: DC

## 2024-12-17 NOTE — Progress Notes (Unsigned)
 "  New Patient Office Visit  Subjective    Patient ID: Cory [name], male    DOB: November 17, 1946  Age: 78 y.o. MRN: [id number]  HPI Cory [name] presents to establish care.  Was previously being seen by Dr. Marvine at [facility].  Sees Dr. Joshua at [facility]. Follows closely with cardiology as he has a significant cardiac history.  Has a history of PTSD, as he was a [employer] veteran.  Reports that he is having increased PTSD symptoms, such as nightmares, flashbacks, and waking up frequently in the middle of the night.  Says that he is also noticing that he is having increased memory issues for the past 6 months. Reports frequent forgetting of names, tasks he was going to complete right before doing them, and gets lost frequently when driving. Wife is concerned as well. Has been referred by Neurology in the past, but did not ever hear back from referral.   Also would like neurology referral due to leg weakness that has increased in the past 6 months.  Says that he has noticed that he is having trouble with walking and getting out of the chair.  Is worried for potential falls.  Has a history of lower back pain but has not been back to orthopedics.  Reports that back pain has not been giving him problems.  Denies any sciatica, numbness or tingling. Denies loss of bladder or bowel function.   Past Medical History:  Diagnosis Date   Anemia    Arthritis    Coronary artery disease    a. 09/2001 PCI/BMS to D1: 2.5 x 15 Express 2 BMS;  b. 2011 Cath: stable anatomy; c. 03/2014 Low risk MV, EF 54%;  d. 09/2016 Inf STEMI/PCI [facility]): LM nl, LAD 62m, D1 nl w/ 90% in 1mm inf branch, LCX nl, RCA 99 (Synergy DES x 4 - 3.5x28 prox, 3.5x35m, 2.5x16d, 2.25x16 to RPDA), EF 55%.   GERD (gastroesophageal reflux disease)    Hyperlipidemia    Hypertensive heart disease    Myocardial infarction [facility]) 2017   Pneumonia    PTSD (post-traumatic stress disorder)     Past Surgical History:   Procedure Laterality Date   BIOPSY  04/10/2020   Procedure: BIOPSY;  Surgeon: Golda Claudis PENNER, MD;  [facility];  Service: Endoscopy;;  distal, esophageal   CARDIAC CATHETERIZATION  05/17/2010   left main free of significant lesion; LAD with 60% lesion in mid segment & 60-70% lesion in mid-distal segment with subtotal occlusion at apex & small side branch of diagonal 1 that is subtotally occluded; ramus intermedius with 90% ostial stenosis; L Cfx w/mild luminal irreg; RCA is dominant vessel with diffuse disease, 50% prox lesion & 60% lesion in mid segment & 60% lesion in PDA   CATARACT EXTRACTION W/PHACO Right 06/07/2015   Procedure: CATARACT EXTRACTION PHACO AND INTRAOCULAR LENS PLACEMENT (IOC);  Surgeon: Gaither Quan, MD;  Location: [facility] OR;  Service: Ophthalmology;  Laterality: Right;   COLONOSCOPY  02/25/2012   Procedure: COLONOSCOPY;  Surgeon: Oneil DELENA Budge, MD;  [facility];  Service: Gastroenterology;  Laterality: N/A;   COLONOSCOPY N/A 04/11/2020   Procedure: COLONOSCOPY;  Surgeon: Golda Claudis PENNER, MD;  [facility];  Service: Endoscopy;  Laterality: N/A;   CORONARY ANGIOPLASTY WITH STENT PLACEMENT  09/2001   Taxus (2.5x70mm) stent to 1st diagonal    ESOPHAGEAL DILATION N/A 08/14/2016   Procedure: ESOPHAGEAL DILATION;  Surgeon: Claudis PENNER Golda, MD;  [facility];  Service: Endoscopy;  Laterality: N/A;   ESOPHAGOGASTRODUODENOSCOPY  02/25/2012   Procedure: ESOPHAGOGASTRODUODENOSCOPY (EGD);  Surgeon: Oneil DELENA Budge, MD;  [facility];  Service: Gastroenterology;  Laterality: N/A;   ESOPHAGOGASTRODUODENOSCOPY N/A 07/28/2014   Procedure: ESOPHAGOGASTRODUODENOSCOPY (EGD);  Surgeon: Claudis RAYMOND Rivet, MD;  [facility];  Service: Endoscopy;  Laterality: N/A;  200-rescheduled to 245 Ann notified pt   ESOPHAGOGASTRODUODENOSCOPY N/A 08/14/2016   Procedure: ESOPHAGOGASTRODUODENOSCOPY (EGD);  Surgeon: Claudis RAYMOND Rivet, MD;  [facility];  Service: Endoscopy;  Laterality: N/A;  100   ESOPHAGOGASTRODUODENOSCOPY N/A 04/10/2020   Procedure: ESOPHAGOGASTRODUODENOSCOPY (EGD);  Surgeon: Rivet Claudis RAYMOND, MD;  [facility];  Service: Endoscopy;  Laterality: N/A;   EYE SURGERY Bilateral    lasik surgery    GIVENS CAPSULE STUDY N/A 04/12/2020   Procedure: GIVENS CAPSULE STUDY;  Surgeon: Rivet Claudis RAYMOND, MD;  [facility];  Service: Endoscopy;  Laterality: N/A;   KNEE ARTHROSCOPY WITH MEDIAL MENISECTOMY Left 02/05/2018   Procedure: LEFT KNEE ARTHROSCOPY WITH PARTIAL MEDIAL MENISECTOMY;  Surgeon: Margrette Taft BRAVO, MD;  Location: [facility];  Service: Orthopedics;  Laterality: Left;   KNEE SURGERY     LATERAL EPICONDYLE RELEASE Left 01/21/2014   Procedure: Left Tennis Elbow Release with debridement  tendon repair with reattachment;  Surgeon: Taft BRAVO Margrette, MD;  Location: [facility];  Service: Orthopedics;  Laterality: Left;   penile implant     PROSTATE SURGERY     SHOULDER SURGERY     TONSILLECTOMY     TOTAL KNEE ARTHROPLASTY Left 10/19/2018   Procedure: LEFT TOTAL KNEE ARTHROPLASTY;  Surgeon: Rubie Kemps, MD;  Location: [facility];  Service: Orthopedics;  Laterality: Left;  with abductor block failed spinal   TRANSTHORACIC ECHOCARDIOGRAM  03/2010   EF=>55%; LV mildly dilated; LA mod dilated; mild MR & TR; normal RVSP; mild pulm valve regurg    Family History  Problem Relation Age of Onset   Colitis Maternal Aunt     Social History   Socioeconomic History   Marital status: Married    Spouse name: Not on file   Number of children: Not on file   Years of education: Not on file   Highest education level: 12th grade  Occupational History   Not on file  Tobacco Use   Smoking status: Never   Smokeless tobacco: Never  Vaping Use   Vaping status: Never Used  Substance and Sexual Activity   Alcohol use: Yes    Alcohol/week: 1.0 standard drink of alcohol    Types: 1 Glasses of wine per week    Comment:  nightly to help sleep   Drug use: No   Sexual activity: Not on file  Other Topics Concern   Not on file  Social History Narrative   Not on file   Social Drivers of Health   Tobacco Use: Low Risk (12/13/2024)   Patient History    Smoking Tobacco Use: Never    Smokeless Tobacco Use: Never    Passive Exposure: Not on file  Financial Resource Strain: Low Risk (12/13/2024)   Overall Financial Resource Strain (CARDIA)    Difficulty of Paying Living Expenses: Not hard at all  Food Insecurity: No Food Insecurity (12/13/2024)   Epic    Worried About Radiation Protection Practitioner of Food in the Last Year: Never true    Ran Out of Food in the Last Year: Never true  Transportation Needs: No Transportation Needs (12/13/2024)  Epic    Lack of Transportation (Medical): No    Lack of Transportation (Non-Medical): No  Physical Activity: Inactive (12/13/2024)   Exercise Vital Sign    Days of Exercise per Week: 0 days    Minutes of Exercise per Session: Not on file  Stress: No Stress Concern Present (12/13/2024)   [employer] of Occupational Health - Occupational Stress Questionnaire    Feeling of Stress: Only a little  Social Connections: Moderately Isolated (12/13/2024)   Social Connection and Isolation Panel    Frequency of Communication with Friends and Family: Never    Frequency of Social Gatherings with Friends and Family: Once a week    Attends Religious Services: More than 4 times per year    Active Member of [employer] or Organizations: No    Attends Banker Meetings: Not on file    Marital Status: Married  Catering Manager Violence: Not on file  Depression (PHQ2-9): Medium Risk (12/17/2024)   Depression (PHQ2-9)    PHQ-2 Score: 6  Alcohol Screen: Not on file  Housing: Low Risk (12/13/2024)   Epic    Unable to Pay for Housing in the Last Year: No    Number of Times Moved in the Last Year: 0    Homeless in the Last Year: No  Utilities: Not on file  Health Literacy: Not on file    Review of Systems  Constitutional:  Positive for fatigue. Negative for chills and fever.  Eyes:  Negative for visual disturbance.  Respiratory:  Negative for cough, chest tightness, shortness of breath and wheezing.   Cardiovascular:  Negative for chest pain, palpitations and leg swelling.  Neurological:  Positive for weakness. Negative for dizziness, syncope, light-headedness, numbness and headaches.  Psychiatric/Behavioral:  Positive for sleep disturbance. Negative for self-injury and suicidal ideas. The patient is nervous/anxious.    Objective    BP 139/88   Pulse 71   Temp 98.6 F (37 C)   Ht 5' 9.75 (1.772 m)   Wt 169 lb 4 oz (76.8 kg)   SpO2 98%   BMI 24.46 kg/m   Physical Exam Vitals and nursing note reviewed.  Constitutional:      General: He is not in acute distress.    Appearance: Normal appearance. He is not ill-appearing.  Eyes:     General: No visual field deficit. Cardiovascular:     Rate and Rhythm: Normal rate and regular rhythm.     Heart sounds: Normal heart sounds. No murmur heard. Pulmonary:     Effort: Pulmonary effort is normal. No respiratory distress.     Breath sounds: Normal breath sounds. No wheezing.  Musculoskeletal:     Right lower leg: No edema.     Left lower leg: No edema.     Comments: Patient did have difficulty getting on and off of the exam table.  Gait antalgic with walking.  Full range of motion of the lumbar spine with no pain elicited.  Full range of motion and strength +5 in bilateral lower extremities. Bilateral great toe extension is intact against resistance. Sensation intact in bilateral lower extremities.   Neurological:     General: No focal deficit present.     Mental Status: He is alert and oriented to person, place, and time. Mental status is at baseline.     Cranial Nerves: No cranial nerve deficit, dysarthria or facial asymmetry.     Sensory: No sensory deficit.     Motor: No weakness.     Coordination:  Romberg sign  negative. Coordination normal. Heel to Shin Test normal.  Psychiatric:        Attention and Perception: Attention normal.        Mood and Affect: Mood normal.        Behavior: Behavior normal.        Thought Content: Thought content normal. Thought content does not include homicidal or suicidal ideation.        Cognition and Memory: He exhibits impaired recent memory.        Judgment: Judgment normal.       12/17/2024    2:30 PM  Montreal Cognitive Assessment   Visuospatial/ Executive (0/5) 3  Naming (0/3) 3  Attention: Read list of digits (0/2) 1  Attention: Read list of letters (0/1) 1  Attention: Serial 7 subtraction starting at 100 (0/3) 0  Language: Repeat phrase (0/2) 2  Language : Fluency (0/1) 0  Abstraction (0/2) 2  Delayed Recall (0/5) 0  Orientation (0/6) 6  Total 18  Adjusted Score (based on education) 18      12/17/2024    1:56 PM  Depression screen PHQ 2/9  Decreased Interest 1  PHQ - 2 Score 1  Altered sleeping 0  Tired, decreased energy 3  Feeling bad or failure about yourself  0  Trouble concentrating 1  Moving slowly or fidgety/restless 1  Suicidal thoughts 0  PHQ-9 Score 6  Difficult doing work/chores Somewhat difficult      12/17/2024    1:56 PM  GAD 7 : Generalized Anxiety Score  Nervous, Anxious, on Edge 2  Worry too much - different things 0  Trouble relaxing 0  Restless 1  Easily annoyed or irritable 1  Afraid - awful might happen 0  Anxiety Difficulty Somewhat difficult   Assessment & Plan:  1. Encounter to establish care (Primary) -Advised patient to make a separate appointment for annual physical.  2. Essential hypertension -Blood pressure mildly elevated in today's visit.  Due to patient's significant cardiac history would like to follow this closely. Messaged patient in MyChart after office visit and advised him to take a blood pressure log and bring to the next appointment.  Will discuss this next appointment. -Educated patient  about decreasing salt in diet and exercising as tolerated to help with blood pressure control.  - CMP14+EGFR  3. PTSD (post-traumatic stress disorder) -Advised patient to seek counseling for PTSD symptoms.  Educated patient about the importance of counseling with mental health and PTSD especially with war veterans.  Provided patient with [facility] counseling contact information.  Patient will contact me if he is having issues with this.  -Increase patient's current Zoloft  dose to 100 mg. Educated patient on appropriate use and potential side effects.  Advised patient of mild GI upset and headache, but advised that symptoms should subside after 2 weeks.  Educated the patient that this medication may take up to 6 weeks to start showing improvements.  -Advised patient that if he starts to have suicidal thoughts to seek immediate care. - sertraline  (ZOLOFT ) 100 MG tablet; Take 1 tablet (100 mg total) by mouth daily.  Dispense: 30 tablet; Refill: 1  4. Mild cognitive impairment with memory loss -Completed MoCA at today's office visit, which was positive for mild cognitive impairment.  Due to patient having prior neurology referral, agreed to refer patient to neurology at this time.    - Ambulatory referral to Neurology    5. Gait, antalgic -Musculoskeletal exam was negative for anything remarkable.  Would like to rule out any neurologic pathology related to the patient's lower leg weakness.  -Ambulatory referral to Neurology  6. Mixed hyperlipidemia -Educated patient on the importance of  and decreasing cholesterol levels due to patient's increased risk of cardiac events.  Will also recheck lipid to ensure levels are within normal limits for patient's ASCVD risk.  -atorvastatin  (LIPITOR ) 40 MG tablet; Take 1 tablet (40 mg total) by mouth at bedtime.  Dispense: 90 tablet; Refill: 1 - Lipid panel  7. Fatigue, unspecified type - CBC with Differential/Platelet - Vitamin B12   Meds ordered this encounter   Medications   aspirin  EC 81 MG tablet    Sig: Take 1 tablet (81 mg total) by mouth daily. Swallow whole.    Dispense:  90 tablet    Refill:  1   atorvastatin  (LIPITOR ) 40 MG tablet    Sig: Take 1 tablet (40 mg total) by mouth at bedtime.    Dispense:  90 tablet    Refill:  1   buPROPion  ER (WELLBUTRIN  SR) 100 MG 12 hr tablet    Sig: Take 1 tablet (100 mg total) by mouth daily.    Dispense:  30 tablet    Refill:  2   loratadine  (CLARITIN ) 10 MG tablet    Sig: Take 1 tablet (10 mg total) by mouth daily.    Dispense:  90 tablet    Refill:  1   traZODone  (DESYREL ) 100 MG tablet    Sig: Take 1.5 tablets (150 mg total) by mouth at bedtime. 150 mg QHS    Dispense:  45 tablet    Refill:  2   sertraline  (ZOLOFT ) 100 MG tablet    Sig: Take 1 tablet (100 mg total) by mouth daily.    Dispense:  30 tablet    Refill:  1    Return in about 6 weeks (around 01/28/2025) for physical.   Damien KATHEE Pringle, FNP   "

## 2024-12-21 LAB — CMP14+EGFR
ALT: 13 IU/L (ref 0–44)
AST: 13 IU/L (ref 0–40)
Albumin: 4.3 g/dL (ref 3.8–4.8)
Alkaline Phosphatase: 85 IU/L (ref 47–123)
BUN/Creatinine Ratio: 18 (ref 10–24)
BUN: 19 mg/dL (ref 8–27)
Bilirubin Total: 0.5 mg/dL (ref 0.0–1.2)
CO2: 23 mmol/L (ref 20–29)
Calcium: 9.7 mg/dL (ref 8.6–10.2)
Chloride: 108 mmol/L — ABNORMAL HIGH (ref 96–106)
Creatinine, Ser: 1.07 mg/dL (ref 0.76–1.27)
Globulin, Total: 2.2 g/dL (ref 1.5–4.5)
Glucose: 106 mg/dL — ABNORMAL HIGH (ref 70–99)
Potassium: 5 mmol/L (ref 3.5–5.2)
Sodium: 145 mmol/L — ABNORMAL HIGH (ref 134–144)
Total Protein: 6.5 g/dL (ref 6.0–8.5)
eGFR: 71 mL/min/1.73

## 2024-12-21 LAB — LIPID PANEL
Chol/HDL Ratio: 3.3 ratio (ref 0.0–5.0)
Cholesterol, Total: 127 mg/dL (ref 100–199)
HDL: 39 mg/dL — ABNORMAL LOW
LDL Chol Calc (NIH): 64 mg/dL (ref 0–99)
Triglycerides: 133 mg/dL (ref 0–149)
VLDL Cholesterol Cal: 24 mg/dL (ref 5–40)

## 2024-12-21 LAB — CBC WITH DIFFERENTIAL/PLATELET
Basophils Absolute: 0.1 x10E3/uL (ref 0.0–0.2)
Basos: 1 %
EOS (ABSOLUTE): 0.2 x10E3/uL (ref 0.0–0.4)
Eos: 4 %
Hematocrit: 43.1 % (ref 37.5–51.0)
Hemoglobin: 13.9 g/dL (ref 13.0–17.7)
Immature Grans (Abs): 0 x10E3/uL (ref 0.0–0.1)
Immature Granulocytes: 0 %
Lymphocytes Absolute: 1.8 x10E3/uL (ref 0.7–3.1)
Lymphs: 34 %
MCH: 30.1 pg (ref 26.6–33.0)
MCHC: 32.3 g/dL (ref 31.5–35.7)
MCV: 93 fL (ref 79–97)
Monocytes Absolute: 0.5 x10E3/uL (ref 0.1–0.9)
Monocytes: 8 %
Neutrophils Absolute: 2.8 x10E3/uL (ref 1.4–7.0)
Neutrophils: 53 %
Platelets: 204 x10E3/uL (ref 150–450)
RBC: 4.62 x10E6/uL (ref 4.14–5.80)
RDW: 12.4 % (ref 11.6–15.4)
WBC: 5.3 x10E3/uL (ref 3.4–10.8)

## 2024-12-21 LAB — VITAMIN B12: Vitamin B-12: 467 pg/mL (ref 232–1245)

## 2024-12-27 ENCOUNTER — Ambulatory Visit: Payer: Self-pay

## 2025-01-06 NOTE — Progress Notes (Signed)
 Cory [name]                                          MRN: [id number]   01/06/2025   The [facility] Quality Team Specialist reviewed this patient medical record for the purposes of chart review for care gap closure. The following were reviewed: abstraction for care gap closure-controlling blood pressure.    [facility] Quality Team

## 2025-01-28 ENCOUNTER — Ambulatory Visit

## 2025-01-28 VITALS — BP 142/84 | HR 63 | Temp 98.2°F | Ht 69.75 in | Wt 174.4 lb

## 2025-01-28 DIAGNOSIS — Z0001 Encounter for general adult medical examination with abnormal findings: Secondary | ICD-10-CM | POA: Diagnosis not present

## 2025-01-28 DIAGNOSIS — Z0000 Encounter for general adult medical examination without abnormal findings: Secondary | ICD-10-CM

## 2025-01-28 DIAGNOSIS — M19012 Primary osteoarthritis, left shoulder: Secondary | ICD-10-CM

## 2025-01-28 DIAGNOSIS — M19011 Primary osteoarthritis, right shoulder: Secondary | ICD-10-CM | POA: Insufficient documentation

## 2025-01-28 DIAGNOSIS — G3184 Mild cognitive impairment, so stated: Secondary | ICD-10-CM

## 2025-01-28 DIAGNOSIS — F4310 Post-traumatic stress disorder, unspecified: Secondary | ICD-10-CM

## 2025-01-28 DIAGNOSIS — I10 Essential (primary) hypertension: Secondary | ICD-10-CM | POA: Diagnosis not present

## 2025-01-28 DIAGNOSIS — R29898 Other symptoms and signs involving the musculoskeletal system: Secondary | ICD-10-CM | POA: Diagnosis not present

## 2025-01-28 DIAGNOSIS — I2510 Atherosclerotic heart disease of native coronary artery without angina pectoris: Secondary | ICD-10-CM | POA: Diagnosis not present

## 2025-01-28 MED ORDER — TRAZODONE HCL 100 MG PO TABS: ORAL_TABLET | ORAL | 1 refills | Status: AC

## 2025-01-28 MED ORDER — SERTRALINE HCL 100 MG PO TABS: 150.0000 mg | ORAL_TABLET | Freq: Every day | ORAL | 5 refills | Status: AC

## 2025-01-28 NOTE — Progress Notes (Signed)
 " Annual Wellness Visit Subjective:     Patient ID: Cory [name], male    DOB: 1946/03/21, 79 y.o.   MRN: [id number]  The patient comes in today for a wellness visit.  A review of their health history was completed. A review of medications was also completed.  Any needed refills: Trazodone  and Zoloft   Eating habits: Good  Falls/ MVA accidents in past few months: None  Regular exercise: No  Sleep: Good  Specialist pt sees on regular basis: Cardiology and ophthalmology  Regular eye/dental exams: Yes  Preventative health issues were discussed.   Additional concerns: He has been experiencing leg weakness for several months, which he attributes to a lack of exercise. He notes difficulty getting up from a seated position but is able to walk without assistance. He reports bilateral shoulder pain, particularly when putting on shirts, but denies any popping sounds. The pain is located inside the shoulders. He has a prescription for ibuprofen  800 mg, which he is hesitant to take. Was previously getting injections in his shoulder by orthopedic doctor. Currently taking Zoloft , Wellbutrin , and Trazodone  to manage his PTSD, anxiety and sleep. Medication regimen works well for him.   Review of Systems  Constitutional:  Negative for chills, fatigue and fever.  HENT:  Negative for trouble swallowing.   Eyes:  Negative for visual disturbance.  Respiratory:  Negative for cough, chest tightness, shortness of breath and wheezing.   Cardiovascular:  Negative for chest pain, palpitations and leg swelling.  Gastrointestinal:  Negative for abdominal pain, constipation, diarrhea, nausea and vomiting.  Genitourinary:  Positive for difficulty urinating.  Musculoskeletal:        Positive for bilateral knee pain and bilateral shoulder pain.   Neurological:  Positive for weakness. Negative for dizziness, light-headedness and headaches.  Psychiatric/Behavioral:  Negative for sleep disturbance. The  patient is not nervous/anxious.        Objective:    Today's Vitals   01/28/25 1430 01/28/25 1445  BP: (!) 147/85 (!) 142/84  Pulse: 63   Temp: 98.2 F (36.8 C)   SpO2: 98%   Weight: 174 lb 6.4 oz (79.1 kg)   Height: 5' 9.75 (1.772 m)    Body mass index is 25.2 kg/m.   Physical Exam Vitals and nursing note reviewed.  Constitutional:      General: He is not in acute distress.    Appearance: Normal appearance. He is normal weight. He is not ill-appearing.  Cardiovascular:     Rate and Rhythm: Normal rate and regular rhythm.     Heart sounds: Normal heart sounds, S1 normal and S2 normal. No murmur heard. Pulmonary:     Effort: Pulmonary effort is normal. No respiratory distress.     Breath sounds: Normal breath sounds. No wheezing.  Abdominal:     General: Abdomen is flat. There is no distension.     Palpations: Abdomen is soft.     Tenderness: There is no abdominal tenderness.  Musculoskeletal:     Right lower leg: No edema.     Left lower leg: No edema.  Neurological:     Mental Status: He is alert.     Coordination: Romberg sign negative.  Psychiatric:        Mood and Affect: Mood normal.        Behavior: Behavior normal.        Thought Content: Thought content normal.        Judgment: Judgment normal.       01/28/2025  2:46 PM 12/17/2024    1:56 PM  Depression screen PHQ 2/9  Decreased Interest 2 1  Down, Depressed, Hopeless 1   PHQ - 2 Score 3 1  Altered sleeping 0 0  Tired, decreased energy 1 3  Change in appetite 0   Feeling bad or failure about yourself  0 0  Trouble concentrating 2 1  Moving slowly or fidgety/restless 1 1  Suicidal thoughts 0 0  PHQ-9 Score 7 6  Difficult doing work/chores Somewhat difficult Somewhat difficult       01/28/2025    2:47 PM 12/17/2024    1:56 PM  GAD 7 : Generalized Anxiety Score  Nervous, Anxious, on Edge 1 2   Control/stop worrying 0   Worry too much - different things 0 0   Trouble relaxing 1 0    Restless 0 1   Easily annoyed or irritable 1 1   Afraid - awful might happen 1 0   Total GAD 7 Score 4   Anxiety Difficulty Somewhat difficult Somewhat difficult     Data saved with a previous flowsheet row definition      Assessment & Plan:  1. Adult wellness visit (Primary) Adult wellness-complete.wellness physical was conducted today. Importance of diet and exercise were discussed in detail.  Importance of stress reduction and healthy living were discussed.  In addition to this a discussion regarding safety was also covered.  We also reviewed over immunizations and gave recommendations regarding current immunization needed for age.   In addition to this additional areas were also touched on including: Preventative health exams needed: None  Patient was advised yearly wellness exam   2. PTSD (post-traumatic stress disorder) - Managed with sertraline  and Wellbutrin . Current doses effective. - Continue sertraline  and Wellbutrin . - Refilled prescriptions as needed.  3. Mild cognitive impairment with memory loss - Patient to continue with follow-up to neurology. Has an appointment for May.   4. 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 - Encouraged daily quadriceps strengthening exercises. - Advised leg exercises to improve muscle strength.  5. Essential hypertension - Blood pressure not optimally managed with current regimen. Today's reading was 142/84.  - Monitor blood pressure at home, aiming for <140/90 mmHg. - Report readings >140/90 mmHg. Patient agrees. Will contact me to follow up sooner if blood pressures are increased.   6. 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 - Recommended topical Voltaren for shoulder pain. - Advised considering ibuprofen  for pain  management. - Suggested orthopedic follow-up for potential injections if pain worsens.  7. CAD in native artery - Regular cardiology follow-up annually, sooner if problems arise.   Meds ordered this encounter  Medications   traZODone  (DESYREL ) 100 MG tablet    Sig: Take one tablet (100 mg) at bedtime.    Dispense:  90 tablet    Refill:  1   sertraline  (ZOLOFT ) 100 MG tablet    Sig: Take 1.5 tablets (150 mg total) by mouth daily.    Dispense:  45 tablet    Refill:  5     Return in about 6 months (around 07/28/2025).  Damien KATHEE Pringle, FNP  Note:  This document was prepared using Dragon voice recognition software and may include unintentional dictation errors.   "

## 2025-05-05 ENCOUNTER — Ambulatory Visit: Admitting: Neurology

## 2025-07-28 ENCOUNTER — Ambulatory Visit
